# Patient Record
Sex: Female | Born: 1960 | Race: Black or African American | Hispanic: No | Marital: Single | Smoking: Never smoker
Health system: Southern US, Community
[De-identification: ages and names within clinical notes are randomized; demographics above are authoritative.]

## PROBLEM LIST (undated history)

## (undated) DIAGNOSIS — E119 Type 2 diabetes mellitus without complications: Secondary | ICD-10-CM

## (undated) DIAGNOSIS — I639 Cerebral infarction, unspecified: Secondary | ICD-10-CM

## (undated) DIAGNOSIS — I10 Essential (primary) hypertension: Secondary | ICD-10-CM

## (undated) DIAGNOSIS — D649 Anemia, unspecified: Secondary | ICD-10-CM

## (undated) DIAGNOSIS — N189 Chronic kidney disease, unspecified: Secondary | ICD-10-CM

---

## 2016-08-25 ENCOUNTER — Emergency Department
Admission: EM | Admit: 2016-08-25 | Discharge: 2016-08-25 | Disposition: A | Discharge status: Home / Self Care | Payer: Medicare Other | Attending: Emergency Medicine | Admitting: Emergency Medicine

## 2016-08-25 DIAGNOSIS — E162 Hypoglycemia, unspecified: Secondary | ICD-10-CM

## 2016-08-25 DIAGNOSIS — I10 Essential (primary) hypertension: Secondary | ICD-10-CM | POA: Diagnosis not present

## 2016-08-25 DIAGNOSIS — E11649 Type 2 diabetes mellitus with hypoglycemia without coma: Secondary | ICD-10-CM | POA: Diagnosis present

## 2016-08-25 HISTORY — DX: Essential (primary) hypertension: I10

## 2016-08-25 HISTORY — DX: Type 2 diabetes mellitus without complications: E11.9

## 2016-08-25 LAB — CBC WITH DIFFERENTIAL/PLATELET
Basophils Absolute: 0 10*3/uL (ref 0–0.1)
Basophils Relative: 0 %
Eosinophils Absolute: 0 10*3/uL (ref 0–0.7)
Eosinophils Relative: 0 %
HCT: 34 % — ABNORMAL LOW (ref 35.0–47.0)
HEMOGLOBIN: 11.7 g/dL — AB (ref 12.0–16.0)
LYMPHS ABS: 0.3 10*3/uL — AB (ref 1.0–3.6)
MCH: 30.8 pg (ref 26.0–34.0)
MCHC: 34.5 g/dL (ref 32.0–36.0)
MCV: 89.2 fL (ref 80.0–100.0)
Monocytes Absolute: 0.1 10*3/uL — ABNORMAL LOW (ref 0.2–0.9)
Monocytes Relative: 2 %
NEUTROS ABS: 3.1 10*3/uL (ref 1.4–6.5)
Platelets: 90 10*3/uL — ABNORMAL LOW (ref 150–440)
RBC: 3.81 MIL/uL (ref 3.80–5.20)
RDW: 13.2 % (ref 11.5–14.5)
WBC: 3.5 10*3/uL — AB (ref 3.6–11.0)

## 2016-08-25 LAB — COMPREHENSIVE METABOLIC PANEL
ALK PHOS: 415 U/L — AB (ref 38–126)
ALT: 131 U/L — AB (ref 14–54)
AST: 87 U/L — ABNORMAL HIGH (ref 15–41)
Albumin: 4.2 g/dL (ref 3.5–5.0)
Anion gap: 8 (ref 5–15)
BUN: 31 mg/dL — ABNORMAL HIGH (ref 6–20)
CALCIUM: 9.7 mg/dL (ref 8.9–10.3)
CO2: 29 mmol/L (ref 22–32)
CREATININE: 1.35 mg/dL — AB (ref 0.44–1.00)
Chloride: 102 mmol/L (ref 101–111)
GFR, EST AFRICAN AMERICAN: 50 mL/min — AB (ref >60)
GFR, EST NON AFRICAN AMERICAN: 43 mL/min — AB (ref >60)
Glucose, Bld: 182 mg/dL — ABNORMAL HIGH (ref 65–99)
Potassium: 3.8 mmol/L (ref 3.5–5.1)
SODIUM: 139 mmol/L (ref 135–145)
Total Bilirubin: 0.5 mg/dL (ref 0.3–1.2)
Total Protein: 8.1 g/dL (ref 6.5–8.1)

## 2016-08-25 LAB — TROPONIN I: Troponin I: <0.03 ng/mL (ref <0.03)

## 2016-08-25 LAB — GLUCOSE, CAPILLARY: GLUCOSE-CAPILLARY: 334 mg/dL — AB (ref 65–99)

## 2016-08-25 NOTE — ED Triage Notes (Signed)
Pt arrives via [facility] from home with reports of low blood sugar this am  Pt's friend reported to the pt that she was in a cold sweat all night the pt reports altered mental status after awakening today so they called EMS  CBG 33 upon EMS arrival they attempted to encourage PO intake and she became nauseated with one episode of emesis  EMS administered an amp of D50 and 16m Zofran pta

## 2016-08-25 NOTE — Discharge Instructions (Signed)
Please keep close observation of her blood sugars at home. Please contact her primary physician to see if any adjustments need to be established.  Please return immediately if condition worsens. Please contact her primary physician or the physician you were given for referral. If you have any specialist physicians involved in her treatment and plan please also contact them. Thank you for using [facility].

## 2016-08-25 NOTE — ED Notes (Signed)
Pt given graham crackers and peanut butter.

## 2016-08-25 NOTE — ED Notes (Signed)
Took pt to the bathroom.

## 2016-08-25 NOTE — ED Provider Notes (Signed)
Time Seen: Approximately 12:26 PM  I have reviewed the triage notes  Chief Complaint: Hypoglycemia   History of Present Illness: Susan Navarro is a 55 y.o. female who presents with altered mental status and was found by EMS to have a low blood sugar at home. Patient admits to taking her insulin and having nothing to eat. She states that she had altered mental status according to a bystander and had some cold sweat and seemed to be very confused and nauseated. EMS gave the patient a 250 bolus and tried to get her to eat and she had some nausea and vomited 1 with no blood or bile. She states the nausea now is improved. Rates overall she feels fine at this point. Past Medical History:  Diagnosis Date  . Diabetes mellitus without complication ([place])   . Hypertension     There are no active problems to display for this patient.   No past surgical history on file.  No past surgical history on file.    Allergies:  Review of patient's allergies indicates no known allergies.  Family History: No family history on file.  Social History: Social History  Substance Use Topics  . Smoking status: Never Smoker  . Smokeless tobacco: Not on file  . Alcohol use No     Review of Systems:   10 point review of systems was performed and was otherwise negative:  Constitutional: No fever Eyes: No visual disturbances ENT: No sore throat, ear pain Cardiac: No chest pain Respiratory: No shortness of breath, wheezing, or stridor Abdomen: No abdominal pain, no vomiting, No diarrhea Endocrine: No weight loss, No night sweats Extremities: No peripheral edema, cyanosis Skin: No rashes, easy bruising Neurologic: No focal weakness, trouble with speech or swollowing Urologic: No dysuria, Hematuria, or urinary frequency   Physical Exam:  ED Triage Vitals  Enc Vitals Group     BP 08/25/16 1237 (!) 185/89     Pulse Rate 08/25/16 1237 87     Resp 08/25/16 1237 16     Temp 08/25/16 1237 97.8  F (36.6 C)     Temp Source 08/25/16 1237 Oral     SpO2 08/25/16 1237 99 %     Weight 08/25/16 1238 150 lb (68 kg)     Height 08/25/16 1238 5' 6"  (1.676 m)     Head Circumference --      Peak Flow --      Pain Score --      Pain Loc --      Pain Edu? --      Excl. in [place]? --     General: Awake , Alert , and Oriented times 3; GCS 15 Head: Normal cephalic , atraumatic Eyes: Pupils equal , round, reactive to light Nose/Throat: No nasal drainage, patent upper airway without erythema or exudate.  Neck: Supple, Full range of motion, No anterior adenopathy or palpable thyroid masses Lungs: Clear to ascultation without wheezes , rhonchi, or rales Heart: Regular rate, regular rhythm without murmurs , gallops , or rubs Abdomen: Soft, non tender without rebound, guarding , or rigidity; bowel sounds positive and symmetric in all 4 quadrants. No organomegaly .        Extremities: 2 plus symmetric pulses. No edema, clubbing or cyanosis Neurologic: normal ambulation, Motor symmetric without deficits, sensory intact Skin: warm, dry, no rashes   Labs:   All laboratory work was reviewed including any pertinent negatives or positives listed below:  Labs Reviewed  CBC WITH DIFFERENTIAL/PLATELET -  Abnormal; Notable for the following:       Result Value   WBC 3.5 (*)    Hemoglobin 11.7 (*)    HCT 34.0 (*)    Platelets 90 (*)    Lymphs Abs 0.3 (*)    Monocytes Absolute 0.1 (*)    All other components within normal limits  COMPREHENSIVE METABOLIC PANEL - Abnormal; Notable for the following:    Glucose, Bld 182 (*)    BUN 31 (*)    Creatinine, Ser 1.35 (*)    AST 87 (*)    ALT 131 (*)    Alkaline Phosphatase 415 (*)    GFR calc non Af Amer 43 (*)    GFR calc Af Amer 50 (*)    All other components within normal limits  GLUCOSE, CAPILLARY - Abnormal; Notable for the following:    Glucose-Capillary 334 (*)    All other components within normal limits  TROPONIN I    EKG:  ED ECG REPORT I,  Daymon Larsen, the attending physician, personally viewed and interpreted this ECG.  Date: 08/25/2016 EKG Time: 1241 Rate: *84 Rhythm: normal sinus rhythm QRS Axis: normal Intervals: normal ST/T Wave abnormalities: normal Conduction Disturbances: none Narrative Interpretation: unremarkable Poor quality EKG Nonspecific T-wave abnormalities No acute ischemic changes are noted   ED Course:  Patient was given a meal here in emergency department her blood sugar was managed Patient received serial blood sugars which seemed to stabilize. She is currently asymptomatic and I felt she simply did not eat early enough after receiving insulin.  Clinical Course     Assessment:  Acute hypoglycemia Insulin-dependent diabetes   Final Clinical Impression:  Final diagnoses:  Hypoglycemia     Plan:  Outpatient Patient was advised to return immediately if condition worsens. Patient was advised to follow up with their primary care physician or other specialized physicians involved in their outpatient care. The patient and/or family member/power of attorney had laboratory results reviewed at the bedside. All questions and concerns were addressed and appropriate discharge instructions were distributed by the nursing staff.             Daymon Larsen, MD 08/25/16 [phone]

## 2016-10-05 ENCOUNTER — Emergency Department
Admission: EM | Admit: 2016-10-05 | Discharge: 2016-10-05 | Disposition: A | Discharge status: Home / Self Care | Payer: Medicare Other | Attending: Emergency Medicine | Admitting: Emergency Medicine

## 2016-10-05 DIAGNOSIS — I10 Essential (primary) hypertension: Secondary | ICD-10-CM | POA: Diagnosis not present

## 2016-10-05 DIAGNOSIS — R531 Weakness: Secondary | ICD-10-CM | POA: Diagnosis not present

## 2016-10-05 DIAGNOSIS — E119 Type 2 diabetes mellitus without complications: Secondary | ICD-10-CM | POA: Diagnosis not present

## 2016-10-05 LAB — COMPREHENSIVE METABOLIC PANEL
ALK PHOS: 243 U/L — AB (ref 38–126)
ALT: 53 U/L (ref 14–54)
ANION GAP: 7 (ref 5–15)
AST: 30 U/L (ref 15–41)
Albumin: 3.8 g/dL (ref 3.5–5.0)
BUN: 37 mg/dL — ABNORMAL HIGH (ref 6–20)
CALCIUM: 9.2 mg/dL (ref 8.9–10.3)
CHLORIDE: 108 mmol/L (ref 101–111)
CO2: 26 mmol/L (ref 22–32)
Creatinine, Ser: 1.58 mg/dL — ABNORMAL HIGH (ref 0.44–1.00)
GFR calc non Af Amer: 36 mL/min — ABNORMAL LOW (ref >60)
GFR, EST AFRICAN AMERICAN: 41 mL/min — AB (ref >60)
Glucose, Bld: 52 mg/dL — ABNORMAL LOW (ref 65–99)
POTASSIUM: 3.8 mmol/L (ref 3.5–5.1)
SODIUM: 141 mmol/L (ref 135–145)
Total Bilirubin: 0.8 mg/dL (ref 0.3–1.2)
Total Protein: 7 g/dL (ref 6.5–8.1)

## 2016-10-05 LAB — URINALYSIS COMPLETE WITH MICROSCOPIC (ARMC ONLY)
Bilirubin Urine: NEGATIVE
Glucose, UA: NEGATIVE mg/dL
KETONES UR: NEGATIVE mg/dL
LEUKOCYTES UA: NEGATIVE
NITRITE: NEGATIVE
PROTEIN: 30 mg/dL — AB
SPECIFIC GRAVITY, URINE: 1.009 (ref 1.005–1.030)
pH: 7 (ref 5.0–8.0)

## 2016-10-05 LAB — CBC WITH DIFFERENTIAL/PLATELET
BASOS PCT: 1 %
Basophils Absolute: 0 10*3/uL (ref 0–0.1)
EOS ABS: 0 10*3/uL (ref 0–0.7)
EOS PCT: 0 %
HCT: 33.5 % — ABNORMAL LOW (ref 35.0–47.0)
Hemoglobin: 11.3 g/dL — ABNORMAL LOW (ref 12.0–16.0)
LYMPHS ABS: 0.6 10*3/uL — AB (ref 1.0–3.6)
Lymphocytes Relative: 15 %
MCH: 31.3 pg (ref 26.0–34.0)
MCHC: 33.8 g/dL (ref 32.0–36.0)
MCV: 92.7 fL (ref 80.0–100.0)
MONOS PCT: 5 %
Monocytes Absolute: 0.2 10*3/uL (ref 0.2–0.9)
Neutro Abs: 3 10*3/uL (ref 1.4–6.5)
Neutrophils Relative %: 79 %
PLATELETS: 90 10*3/uL — AB (ref 150–440)
RBC: 3.62 MIL/uL — ABNORMAL LOW (ref 3.80–5.20)
RDW: 13.7 % (ref 11.5–14.5)
WBC: 3.8 10*3/uL (ref 3.6–11.0)

## 2016-10-05 LAB — TROPONIN I
Troponin I: 0.03 ng/mL (ref <0.03)
Troponin I: 0.03 ng/mL (ref <0.03)

## 2016-10-05 MED ORDER — SODIUM CHLORIDE 0.9 % IV SOLN: Freq: Once | INTRAVENOUS | Status: DC

## 2016-10-05 NOTE — ED Triage Notes (Signed)
States wasn't able to get out of bed today. bs 160.

## 2016-10-05 NOTE — ED Notes (Signed)
Pt assisted to the toilet in room. Pt ambulated with steady gait and was able to produce urine. No difficulties reported.

## 2016-10-05 NOTE — ED Notes (Signed)
Pt ambulated to bathroom for the 3rd time - has had no problems ambulating since her arrival

## 2016-10-05 NOTE — ED Provider Notes (Signed)
[facility] Emergency Department Provider Note        Time seen: ----------------------------------------- 12:27 PM on 10/05/2016 -----------------------------------------    I have reviewed the triage vital signs and the nursing notes.   HISTORY  Chief Complaint Fatigue    HPI Susan Navarro is a 55 y.o. female who presents to the ER after she states she was not able to get out of bed today. She states she felt generally weak, she denies any recent illness or changes in her medicines. She states she feels a little bit better now, nothing makes her symptoms better or worse. It started acutely this morning.   Past Medical History:  Diagnosis Date  . Diabetes mellitus without complication ([place])   . Hypertension     There are no active problems to display for this patient.   No past surgical history on file.  Allergies Review of patient's allergies indicates no known allergies.  Social History Social History  Substance Use Topics  . Smoking status: Never Smoker  . Smokeless tobacco: Not on file  . Alcohol use No    Review of Systems Constitutional: Negative for fever. Cardiovascular: Negative for chest pain. Respiratory: Negative for shortness of breath. Gastrointestinal: Negative for abdominal pain, vomiting and diarrhea. Genitourinary: Negative for dysuria. Musculoskeletal: Negative for back pain. Skin: Negative for rash. Neurological:Positive for generalized weakness  10-point ROS otherwise negative.  ____________________________________________   PHYSICAL EXAM:  VITAL SIGNS: ED Triage Vitals  Enc Vitals Group     BP 10/05/16 1207 (!) 172/88     Pulse Rate 10/05/16 1207 86     Resp 10/05/16 1207 16     Temp 10/05/16 1208 97.7 F (36.5 C)     Temp Source 10/05/16 1208 Oral     SpO2 10/05/16 1207 100 %     Weight 10/05/16 1207 150 lb (68 kg)     Height 10/05/16 1207 5' 6"  (1.676 m)     Head Circumference --      Peak  Flow --      Pain Score --      Pain Loc --      Pain Edu? --      Excl. in [place]? --     Constitutional: Alert and oriented. Well appearing and in no distress. Eyes: Conjunctivae are normal. PERRL. Normal extraocular movements. ENT   Head: Normocephalic and atraumatic.   Nose: No congestion/rhinnorhea.   Mouth/Throat: Mucous membranes are moist.   Neck: No stridor. Cardiovascular: Normal rate, regular rhythm. No murmurs, rubs, or gallops. Respiratory: Normal respiratory effort without tachypnea nor retractions. Breath sounds are clear and equal bilaterally. No wheezes/rales/rhonchi. Gastrointestinal: Soft and nontender. Normal bowel sounds Musculoskeletal: Nontender with normal range of motion in all extremities. No lower extremity tenderness nor edema. Neurologic:  Normal speech and language. No gross focal neurologic deficits are appreciated.  Skin:  Skin is warm, dry and intact. No rash noted. Psychiatric: Mood and affect are normal. Speech and behavior are normal.  ____________________________________________  EKG: Interpreted by me. Sinus rhythm rate 92 bpm, normal PR interval, normal QRS, normal QT, normal axis.  ____________________________________________  ED COURSE:  Pertinent labs & imaging results that were available during my care of the patient were reviewed by me and considered in my medical decision making (see chart for details). Clinical Course  Patient presents to ER for weakness of unclear etiology. We will assess basic labs and imaging if needed.  Procedures ____________________________________________   LABS (pertinent positives/negatives)  Labs  Reviewed  CBC WITH DIFFERENTIAL/PLATELET - Abnormal; Notable for the following:       Result Value   RBC 3.62 (*)    Hemoglobin 11.3 (*)    HCT 33.5 (*)    Platelets 90 (*)    Lymphs Abs 0.6 (*)    All other components within normal limits  COMPREHENSIVE METABOLIC PANEL - Abnormal; Notable for the  following:    Glucose, Bld 52 (*)    BUN 37 (*)    Creatinine, Ser 1.58 (*)    Alkaline Phosphatase 243 (*)    GFR calc non Af Amer 36 (*)    GFR calc Af Amer 41 (*)    All other components within normal limits  URINALYSIS COMPLETEWITH MICROSCOPIC ([facility] ONLY) - Abnormal; Notable for the following:    Color, Urine YELLOW (*)    APPearance HAZY (*)    Hgb urine dipstick 1+ (*)    Protein, ur 30 (*)    Bacteria, UA MANY (*)    Squamous Epithelial / LPF 0-5 (*)    All other components within normal limits  TROPONIN I - Abnormal; Notable for the following:    Troponin I 0.03 (*)    All other components within normal limits  TROPONIN I  CBG MONITORING, ED   ____________________________________________  FINAL ASSESSMENT AND PLAN  Weakness  Plan: Patient with labs as dictated above. Patient's in no acute distress, labs are unremarkable. Likely transient hypoglycemia. Uncertain etiology for troponin of 0.03. She is stable for outpatient follow-up.   Earleen Newport, MD   Note: This dictation was prepared with Dragon dictation. Any transcriptional errors that result from this process are unintentional    Earleen Newport, MD 10/05/16 [phone]

## 2017-02-25 ENCOUNTER — Encounter: Payer: Self-pay | Admitting: Intensive Care

## 2017-02-25 ENCOUNTER — Emergency Department
Admission: EM | Admit: 2017-02-25 | Discharge: 2017-02-25 | Disposition: A | Discharge status: Home / Self Care | Payer: Medicare Other | Attending: Emergency Medicine | Admitting: Emergency Medicine

## 2017-02-25 DIAGNOSIS — Z794 Long term (current) use of insulin: Secondary | ICD-10-CM | POA: Diagnosis not present

## 2017-02-25 DIAGNOSIS — E162 Hypoglycemia, unspecified: Secondary | ICD-10-CM | POA: Diagnosis present

## 2017-02-25 DIAGNOSIS — E11649 Type 2 diabetes mellitus with hypoglycemia without coma: Secondary | ICD-10-CM | POA: Diagnosis not present

## 2017-02-25 DIAGNOSIS — Z9114 Patient's other noncompliance with medication regimen: Secondary | ICD-10-CM | POA: Diagnosis not present

## 2017-02-25 DIAGNOSIS — I10 Essential (primary) hypertension: Secondary | ICD-10-CM | POA: Insufficient documentation

## 2017-02-25 DIAGNOSIS — R350 Frequency of micturition: Secondary | ICD-10-CM | POA: Diagnosis not present

## 2017-02-25 LAB — URINALYSIS, COMPLETE (UACMP) WITH MICROSCOPIC
BACTERIA UA: NONE SEEN
BILIRUBIN URINE: NEGATIVE
Glucose, UA: NEGATIVE mg/dL
Ketones, ur: NEGATIVE mg/dL
NITRITE: NEGATIVE
PH: 8 (ref 5.0–8.0)
Protein, ur: 100 mg/dL — AB
SPECIFIC GRAVITY, URINE: 1.01 (ref 1.005–1.030)

## 2017-02-25 LAB — GLUCOSE, CAPILLARY
GLUCOSE-CAPILLARY: 99 mg/dL (ref 65–99)
GLUCOSE-CAPILLARY: 148 mg/dL — AB (ref 65–99)
GLUCOSE-CAPILLARY: 306 mg/dL — AB (ref 65–99)

## 2017-02-25 LAB — CBC WITH DIFFERENTIAL/PLATELET
BASOS ABS: 0 10*3/uL (ref 0–0.1)
BASOS PCT: 0 %
EOS ABS: 0 10*3/uL (ref 0–0.7)
Eosinophils Relative: 1 %
HEMATOCRIT: 35.7 % (ref 35.0–47.0)
Hemoglobin: 12 g/dL (ref 12.0–16.0)
LYMPHS ABS: 0.5 10*3/uL — AB (ref 1.0–3.6)
Lymphocytes Relative: 15 %
MCH: 30.2 pg (ref 26.0–34.0)
MCHC: 33.6 g/dL (ref 32.0–36.0)
MCV: 89.7 fL (ref 80.0–100.0)
MONO ABS: 0.2 10*3/uL (ref 0.2–0.9)
Monocytes Relative: 5 %
NEUTROS ABS: 2.5 10*3/uL (ref 1.4–6.5)
Neutrophils Relative %: 79 %
PLATELETS: 80 10*3/uL — AB (ref 150–440)
RBC: 3.98 MIL/uL (ref 3.80–5.20)
RDW: 13.4 % (ref 11.5–14.5)
WBC: 3.2 10*3/uL — ABNORMAL LOW (ref 3.6–11.0)

## 2017-02-25 LAB — COMPREHENSIVE METABOLIC PANEL
ALBUMIN: 4.1 g/dL (ref 3.5–5.0)
ALT: 141 U/L — ABNORMAL HIGH (ref 14–54)
ANION GAP: 10 (ref 5–15)
AST: 121 U/L — ABNORMAL HIGH (ref 15–41)
Alkaline Phosphatase: 356 U/L — ABNORMAL HIGH (ref 38–126)
BILIRUBIN TOTAL: 1.1 mg/dL (ref 0.3–1.2)
BUN: 38 mg/dL — AB (ref 6–20)
CALCIUM: 9.5 mg/dL (ref 8.9–10.3)
CO2: 26 mmol/L (ref 22–32)
CREATININE: 1.34 mg/dL — AB (ref 0.44–1.00)
Chloride: 108 mmol/L (ref 101–111)
GFR calc Af Amer: 50 mL/min — ABNORMAL LOW (ref >60)
GFR calc non Af Amer: 43 mL/min — ABNORMAL LOW (ref >60)
GLUCOSE: 108 mg/dL — AB (ref 65–99)
Potassium: 4 mmol/L (ref 3.5–5.1)
SODIUM: 144 mmol/L (ref 135–145)
TOTAL PROTEIN: 7.4 g/dL (ref 6.5–8.1)

## 2017-02-25 MED ORDER — ENALAPRIL MALEATE 10 MG PO TABS
10.0000 mg | ORAL_TABLET | Freq: Once | ORAL | Status: AC
  Administered 2017-02-25: 10 mg via ORAL
  Filled 2017-02-25: qty 1

## 2017-02-25 MED ORDER — METOPROLOL TARTRATE 25 MG PO TABS
25.0000 mg | ORAL_TABLET | Freq: Once | ORAL | Status: AC
  Administered 2017-02-25: 25 mg via ORAL
  Filled 2017-02-25: qty 1

## 2017-02-25 NOTE — Discharge Instructions (Signed)
Do not take your Lantus today. You may restart it tomorrow morning. Follow-up with her doctor tomorrow for further management.

## 2017-02-25 NOTE — ED Triage Notes (Signed)
Patient arrived by EMS from home for hypoglycemia. Took 24 lantus yesterday morning. EMS initial blood sugar 38. EMS gave IM glucagon and brought blood sugar to 98. A&O x4 upon arrival

## 2017-02-25 NOTE — ED Provider Notes (Signed)
[facility] Emergency Department Provider Note  ____________________________________________  Time seen: Approximately 12:26 PM  I have reviewed the triage vital signs and the nursing notes.   HISTORY  Chief Complaint Hypoglycemia   HPI Susan Navarro is a 56 y.o. female a history of hypertension, NAFL cirrhosis, and poorly controlled diabetes who presents for evaluation of hypoglycemia. Review of Epic shows the patient's PCP is documented many episodes of hypoglycemia and the patient refuses to use sliding scale insulin. She is only on Lantus 24 units at morning time.She last took her Lantus 24 hours ago. This morning she reports that she was very weak and couldn't get up from the bed which prompted her boyfriend to call EMS. When EMS arrived patient's blood glucose was 38. She received a dose of IM glucagon. Patient reports that she feels back to her baseline. She has had urinary frequency for the last 24 hours. She denies any recent changes in her insulin regimen. Patient has had multiple episodes of hypoglycemia in the past. She endorses having meat for dinner last night. She hasn't been anything this morning yet. She hasn't taken her Lantus this morning. She has a mild headache since she woke up this morning that has been constant and frontal. She denies nausea, vomiting, diarrhea, chest pain, shortness of breath, URI symptoms, abdominal pain, dysuria.  Past Medical History:  Diagnosis Date  . Diabetes mellitus without complication ([place])   . Hypertension     There are no active problems to display for this patient.   History reviewed. No pertinent surgical history.  Prior to Admission medications   Not on File    Allergies Patient has no known allergies.  History reviewed. No pertinent family history.  Social History Social History  Substance Use Topics  . Smoking status: Never Smoker  . Smokeless tobacco: Never Used  . Alcohol use No     Review of Systems  Constitutional: Negative for fever. Eyes: Negative for visual changes. ENT: Negative for sore throat. Neck: No neck pain  Cardiovascular: Negative for chest pain. Respiratory: Negative for shortness of breath. Gastrointestinal: Negative for abdominal pain, vomiting or diarrhea. Genitourinary: Negative for dysuria. Musculoskeletal: Negative for back pain. Skin: Negative for rash. Neurological: Negative for weakness or numbness. + HA Psych: No SI or HI  ____________________________________________   PHYSICAL EXAM:  VITAL SIGNS: ED Triage Vitals  Enc Vitals Group     BP 02/25/17 1108 (!) 188/91     Pulse Rate 02/25/17 1108 78     Resp 02/25/17 1108 13     Temp 02/25/17 1108 97.6 F (36.4 C)     Temp Source 02/25/17 1108 Oral     SpO2 02/25/17 1108 98 %     Weight 02/25/17 1105 150 lb (68 kg)     Height 02/25/17 1105 5' 6"  (1.676 m)     Head Circumference --      Peak Flow --      Pain Score --      Pain Loc --      Pain Edu? --      Excl. in [place]? --     Constitutional: Alert and oriented. Well appearing and in no apparent distress. HEENT:      Head: Normocephalic and atraumatic.         Eyes: Conjunctivae are normal. Sclera is non-icteric. EOMI. PERRL      Mouth/Throat: Mucous membranes are moist.       Neck: Supple with no signs of  meningismus. Cardiovascular: Regular rate and rhythm. No murmurs, gallops, or rubs. 2+ symmetrical distal pulses are present in all extremities. No JVD. Respiratory: Normal respiratory effort. Lungs are clear to auscultation bilaterally. No wheezes, crackles, or rhonchi.  Gastrointestinal: Soft, non tender, and non distended with positive bowel sounds. No rebound or guarding. Genitourinary: No CVA tenderness. Musculoskeletal: Nontender with normal range of motion in all extremities. No edema, cyanosis, or erythema of extremities. Neurologic: Normal speech and language. A & O x3, PERRL, no nystagmus, CN II-XII intact,  motor testing reveals good tone and bulk throughout. There is no evidence of pronator drift or dysmetria. Muscle strength is 5/5 throughout. Deep tendon reflexes are 2+ throughout with downgoing toes. Sensory examination is intact. Gait is normal. Skin: Skin is warm, dry and intact. No rash noted. Psychiatric: Mood and affect are normal. Speech and behavior are normal.  ____________________________________________   LABS (all labs ordered are listed, but only abnormal results are displayed)  Labs Reviewed  COMPREHENSIVE METABOLIC PANEL - Abnormal; Notable for the following:       Result Value   Glucose, Bld 108 (*)    BUN 38 (*)    Creatinine, Ser 1.34 (*)    AST 121 (*)    ALT 141 (*)    Alkaline Phosphatase 356 (*)    GFR calc non Af Amer 43 (*)    GFR calc Af Amer 50 (*)    All other components within normal limits  CBC WITH DIFFERENTIAL/PLATELET - Abnormal; Notable for the following:    WBC 3.2 (*)    Platelets 80 (*)    Lymphs Abs 0.5 (*)    All other components within normal limits  URINALYSIS, COMPLETE (UACMP) WITH MICROSCOPIC - Abnormal; Notable for the following:    Color, Urine YELLOW (*)    APPearance HAZY (*)    Hgb urine dipstick SMALL (*)    Protein, ur 100 (*)    Leukocytes, UA TRACE (*)    Squamous Epithelial / LPF 0-5 (*)    All other components within normal limits  GLUCOSE, CAPILLARY - Abnormal; Notable for the following:    Glucose-Capillary 148 (*)    All other components within normal limits  URINE CULTURE  GLUCOSE, CAPILLARY  CBG MONITORING, ED  CBG MONITORING, ED   ____________________________________________  EKG  ED ECG REPORT I, Rudene Re, the attending physician, personally viewed and interpreted this ECG.  Normal sinus rhythm, rate of 76, prolonged QRS at 147, normal QTC, normal axis, no ST elevations or  depressions. ____________________________________________  RADIOLOGY  none ____________________________________________   PROCEDURES  Procedure(s) performed: None Procedures Critical Care performed:  None ____________________________________________   INITIAL IMPRESSION / ASSESSMENT AND PLAN / ED COURSE  56 y.o. female a history of hypertension and poorly controlled diabetes who presents for evaluation of hypoglycemia. Patient's last insulin was 24 hours ago. She is on Lantus only. She refuses to be placed on a sliding scale per her PCP. Patient only had meet last night for dinner with no carbs. She also has had urinary frequency. Will check basic blood work, UA to eval for UTI. Will give her morning HTN meds, will give her breakfast. Will monitor sugars for a few hours.  Clinical Course as of Feb 26 1356  Wed Feb 25, 2017  1355 Patient is tolerating by mouth. He remains at baseline. Blood work showing mildly elevated LFTs the patient has had in the past. She does have a history of cirrhosis. Patient has no abdominal  pain or tenderness on exam. Her UA does not show any evidence of a urinary tract infection. Urine culture is pending. We'll repeat blood glucose at this time and if it stable will discharge patient home. Recommended that she does not take her Lantus today and re-start tomorrow morning. Recommended that she follows up with her primary care doctor tomorrow morning for further evaluation.  [CV]    Clinical Course User Index [CV] Rudene Re, MD    Pertinent labs & imaging results that were available during my care of the patient were reviewed by me and considered in my medical decision making (see chart for details).    ____________________________________________   FINAL CLINICAL IMPRESSION(S) / ED DIAGNOSES  Final diagnoses:  Hypoglycemia associated with type 2 diabetes mellitus (HCC)      NEW MEDICATIONS STARTED DURING THIS VISIT:  New Prescriptions    No medications on file     Note:  This document was prepared using Dragon voice recognition software and may include unintentional dictation errors.    Rudene Re, MD 02/25/17 1357

## 2017-02-25 NOTE — ED Notes (Signed)
Pt given orange juice and sandwich tray.

## 2017-02-26 LAB — URINE CULTURE

## 2018-03-22 DIAGNOSIS — I639 Cerebral infarction, unspecified: Secondary | ICD-10-CM | POA: Diagnosis present

## 2018-03-22 HISTORY — DX: Cerebral infarction, unspecified: I63.9

## 2018-12-09 ENCOUNTER — Emergency Department: Payer: Medicare Other

## 2018-12-09 ENCOUNTER — Inpatient Hospital Stay
Admission: EM | Admit: 2018-12-09 | Discharge: 2018-12-10 | DRG: 638 | Disposition: A | Discharge status: Home Health | Payer: Medicare Other | Attending: Internal Medicine | Admitting: Internal Medicine

## 2018-12-09 ENCOUNTER — Other Ambulatory Visit: Payer: Self-pay

## 2018-12-09 DIAGNOSIS — E860 Dehydration: Secondary | ICD-10-CM | POA: Diagnosis not present

## 2018-12-09 DIAGNOSIS — R739 Hyperglycemia, unspecified: Secondary | ICD-10-CM

## 2018-12-09 DIAGNOSIS — E1110 Type 2 diabetes mellitus with ketoacidosis without coma: Secondary | ICD-10-CM | POA: Diagnosis not present

## 2018-12-09 DIAGNOSIS — E871 Hypo-osmolality and hyponatremia: Secondary | ICD-10-CM | POA: Diagnosis present

## 2018-12-09 DIAGNOSIS — Z79899 Other long term (current) drug therapy: Secondary | ICD-10-CM | POA: Diagnosis not present

## 2018-12-09 DIAGNOSIS — N179 Acute kidney failure, unspecified: Secondary | ICD-10-CM | POA: Diagnosis not present

## 2018-12-09 DIAGNOSIS — N1330 Unspecified hydronephrosis: Secondary | ICD-10-CM

## 2018-12-09 DIAGNOSIS — I129 Hypertensive chronic kidney disease with stage 1 through stage 4 chronic kidney disease, or unspecified chronic kidney disease: Secondary | ICD-10-CM | POA: Diagnosis present

## 2018-12-09 DIAGNOSIS — Z794 Long term (current) use of insulin: Secondary | ICD-10-CM | POA: Diagnosis not present

## 2018-12-09 DIAGNOSIS — D638 Anemia in other chronic diseases classified elsewhere: Secondary | ICD-10-CM | POA: Diagnosis not present

## 2018-12-09 DIAGNOSIS — Z23 Encounter for immunization: Secondary | ICD-10-CM

## 2018-12-09 DIAGNOSIS — Z7982 Long term (current) use of aspirin: Secondary | ICD-10-CM

## 2018-12-09 DIAGNOSIS — E1122 Type 2 diabetes mellitus with diabetic chronic kidney disease: Secondary | ICD-10-CM | POA: Diagnosis not present

## 2018-12-09 DIAGNOSIS — J209 Acute bronchitis, unspecified: Secondary | ICD-10-CM | POA: Diagnosis present

## 2018-12-09 DIAGNOSIS — N183 Chronic kidney disease, stage 3 (moderate): Secondary | ICD-10-CM | POA: Diagnosis present

## 2018-12-09 LAB — BASIC METABOLIC PANEL
Anion gap: 15 (ref 5–15)
Anion gap: 13 (ref 5–15)
Anion gap: 11 (ref 5–15)
BUN: 66 mg/dL — ABNORMAL HIGH (ref 6–20)
BUN: 65 mg/dL — ABNORMAL HIGH (ref 6–20)
BUN: 68 mg/dL — ABNORMAL HIGH (ref 6–20)
CO2: 22 mmol/L (ref 22–32)
CO2: 24 mmol/L (ref 22–32)
CO2: 27 mmol/L (ref 22–32)
Calcium: 9.8 mg/dL (ref 8.9–10.3)
Calcium: 9.7 mg/dL (ref 8.9–10.3)
Calcium: 10.1 mg/dL (ref 8.9–10.3)
Chloride: 93 mmol/L — ABNORMAL LOW (ref 98–111)
Chloride: 96 mmol/L — ABNORMAL LOW (ref 98–111)
Chloride: 99 mmol/L (ref 98–111)
Creatinine, Ser: 2.6 mg/dL — ABNORMAL HIGH (ref 0.44–1.00)
Creatinine, Ser: 2.44 mg/dL — ABNORMAL HIGH (ref 0.44–1.00)
Creatinine, Ser: 2.38 mg/dL — ABNORMAL HIGH (ref 0.44–1.00)
GFR calc Af Amer: 23 mL/min — ABNORMAL LOW (ref >60)
GFR calc Af Amer: 24 mL/min — ABNORMAL LOW (ref >60)
GFR calc Af Amer: 25 mL/min — ABNORMAL LOW (ref >60)
GFR calc non Af Amer: 20 mL/min — ABNORMAL LOW (ref >60)
GFR calc non Af Amer: 21 mL/min — ABNORMAL LOW (ref >60)
GFR calc non Af Amer: 22 mL/min — ABNORMAL LOW (ref >60)
GLUCOSE: 741 mg/dL — AB (ref 70–99)
Glucose, Bld: 468 mg/dL — ABNORMAL HIGH (ref 70–99)
Glucose, Bld: 109 mg/dL — ABNORMAL HIGH (ref 70–99)
POTASSIUM: 3.8 mmol/L (ref 3.5–5.1)
Potassium: 4.2 mmol/L (ref 3.5–5.1)
Potassium: 3.6 mmol/L (ref 3.5–5.1)
Sodium: 130 mmol/L — ABNORMAL LOW (ref 135–145)
Sodium: 133 mmol/L — ABNORMAL LOW (ref 135–145)
Sodium: 137 mmol/L (ref 135–145)

## 2018-12-09 LAB — COMPREHENSIVE METABOLIC PANEL
ALT: 107 U/L — ABNORMAL HIGH (ref 0–44)
AST: 32 U/L (ref 15–41)
Albumin: 4.6 g/dL (ref 3.5–5.0)
Alkaline Phosphatase: 646 U/L — ABNORMAL HIGH (ref 38–126)
Anion gap: 20 — ABNORMAL HIGH (ref 5–15)
BILIRUBIN TOTAL: 1.3 mg/dL — AB (ref 0.3–1.2)
BUN: 73 mg/dL — ABNORMAL HIGH (ref 6–20)
CALCIUM: 10 mg/dL (ref 8.9–10.3)
CO2: 22 mmol/L (ref 22–32)
Chloride: 83 mmol/L — ABNORMAL LOW (ref 98–111)
Creatinine, Ser: 2.71 mg/dL — ABNORMAL HIGH (ref 0.44–1.00)
GFR calc non Af Amer: 19 mL/min — ABNORMAL LOW (ref >60)
GFR, EST AFRICAN AMERICAN: 22 mL/min — AB (ref >60)
Glucose, Bld: 1068 mg/dL (ref 70–99)
POTASSIUM: 5.6 mmol/L — AB (ref 3.5–5.1)
Sodium: 125 mmol/L — ABNORMAL LOW (ref 135–145)
TOTAL PROTEIN: 7.4 g/dL (ref 6.5–8.1)

## 2018-12-09 LAB — BLOOD GAS, VENOUS
Acid-base deficit: 4 mmol/L — ABNORMAL HIGH (ref 0.0–2.0)
BICARBONATE: 23.4 mmol/L (ref 20.0–28.0)
O2 Saturation: 64.8 %
PATIENT TEMPERATURE: 37
pCO2, Ven: 51 mmHg (ref 44.0–60.0)
pH, Ven: 7.27 (ref 7.250–7.430)
pO2, Ven: 39 mmHg (ref 32.0–45.0)

## 2018-12-09 LAB — GLUCOSE, CAPILLARY
GLUCOSE-CAPILLARY: 65 mg/dL — AB (ref 70–99)
Glucose-Capillary: >600 mg/dL (ref 70–99)
Glucose-Capillary: >600 mg/dL (ref 70–99)
Glucose-Capillary: 522 mg/dL (ref 70–99)
Glucose-Capillary: 449 mg/dL — ABNORMAL HIGH (ref 70–99)
Glucose-Capillary: 414 mg/dL — ABNORMAL HIGH (ref 70–99)
Glucose-Capillary: 265 mg/dL — ABNORMAL HIGH (ref 70–99)
Glucose-Capillary: 177 mg/dL — ABNORMAL HIGH (ref 70–99)
Glucose-Capillary: 103 mg/dL — ABNORMAL HIGH (ref 70–99)
Glucose-Capillary: 94 mg/dL (ref 70–99)

## 2018-12-09 LAB — CBC
HCT: 33 % — ABNORMAL LOW (ref 36.0–46.0)
HCT: 32 % — ABNORMAL LOW (ref 36.0–46.0)
HEMOGLOBIN: 10.7 g/dL — AB (ref 12.0–15.0)
Hemoglobin: 11.1 g/dL — ABNORMAL LOW (ref 12.0–15.0)
MCH: 30.1 pg (ref 26.0–34.0)
MCH: 30.5 pg (ref 26.0–34.0)
MCHC: 32.4 g/dL (ref 30.0–36.0)
MCHC: 34.7 g/dL (ref 30.0–36.0)
MCV: 92.7 fL (ref 80.0–100.0)
MCV: 87.9 fL (ref 80.0–100.0)
NRBC: 0 % (ref 0.0–0.2)
NRBC: 0 % (ref 0.0–0.2)
Platelets: 82 10*3/uL — ABNORMAL LOW (ref 150–400)
Platelets: 79 10*3/uL — ABNORMAL LOW (ref 150–400)
RBC: 3.56 MIL/uL — AB (ref 3.87–5.11)
RBC: 3.64 MIL/uL — AB (ref 3.87–5.11)
RDW: 12.7 % (ref 11.5–15.5)
RDW: 12.9 % (ref 11.5–15.5)
WBC: 9.4 10*3/uL (ref 4.0–10.5)
WBC: 11.4 10*3/uL — ABNORMAL HIGH (ref 4.0–10.5)

## 2018-12-09 LAB — URINALYSIS, COMPLETE (UACMP) WITH MICROSCOPIC
BILIRUBIN URINE: NEGATIVE
Bacteria, UA: NONE SEEN
Glucose, UA: >=500 mg/dL — AB
KETONES UR: 5 mg/dL — AB
LEUKOCYTES UA: NEGATIVE
NITRITE: NEGATIVE
PROTEIN: NEGATIVE mg/dL
Specific Gravity, Urine: 1.016 (ref 1.005–1.030)
pH: 6 (ref 5.0–8.0)

## 2018-12-09 LAB — INFLUENZA PANEL BY PCR (TYPE A & B)
INFLAPCR: NEGATIVE
INFLBPCR: NEGATIVE

## 2018-12-09 LAB — LIPASE, BLOOD: Lipase: 34 U/L (ref 11–51)

## 2018-12-09 LAB — MRSA PCR SCREENING: MRSA BY PCR: NEGATIVE

## 2018-12-09 IMAGING — CR DG CHEST 2V
1 series · 2 of 2 positions shown · non-contrast
Comparison: None.

CLINICAL DATA: 57-year-old female with flu like symptoms beginning
last night. Productive cough.

EXAM:
CHEST - 2 VIEW

[Series 1: x chest ap · 0.14mm/px · 2 of 2 slices shown]
[im 1/2]
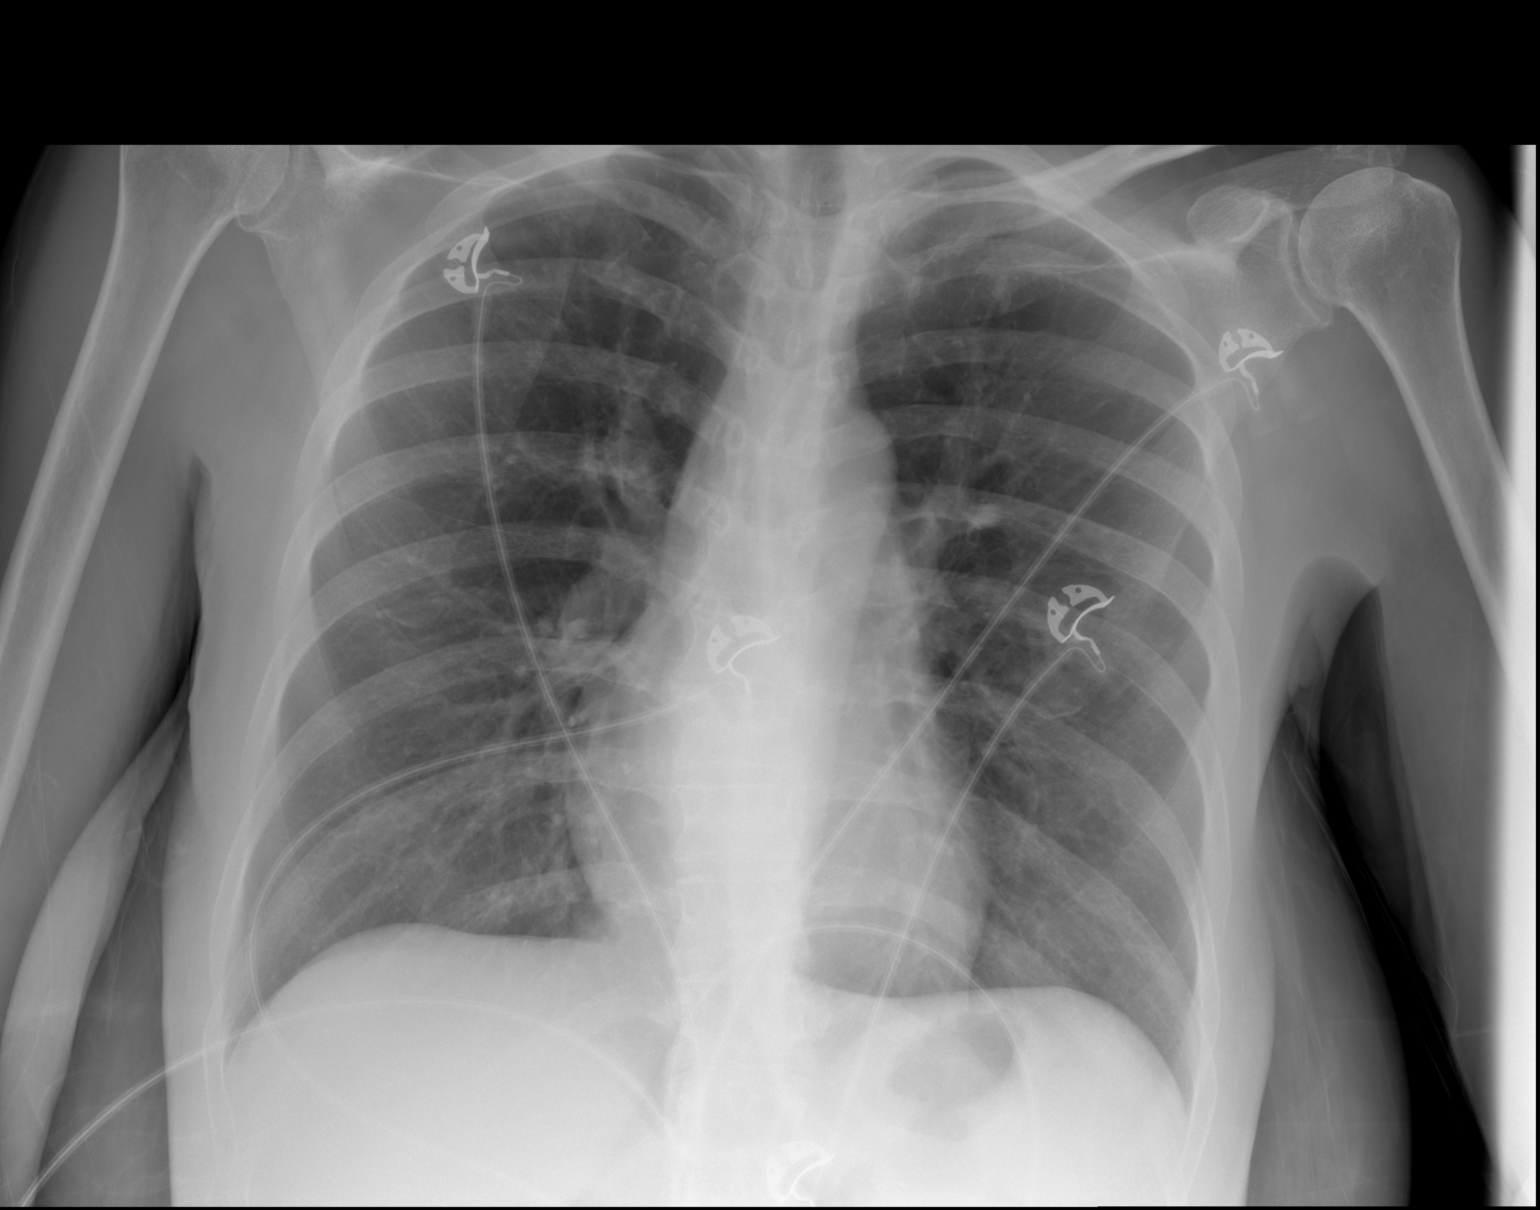
[im 2/2]
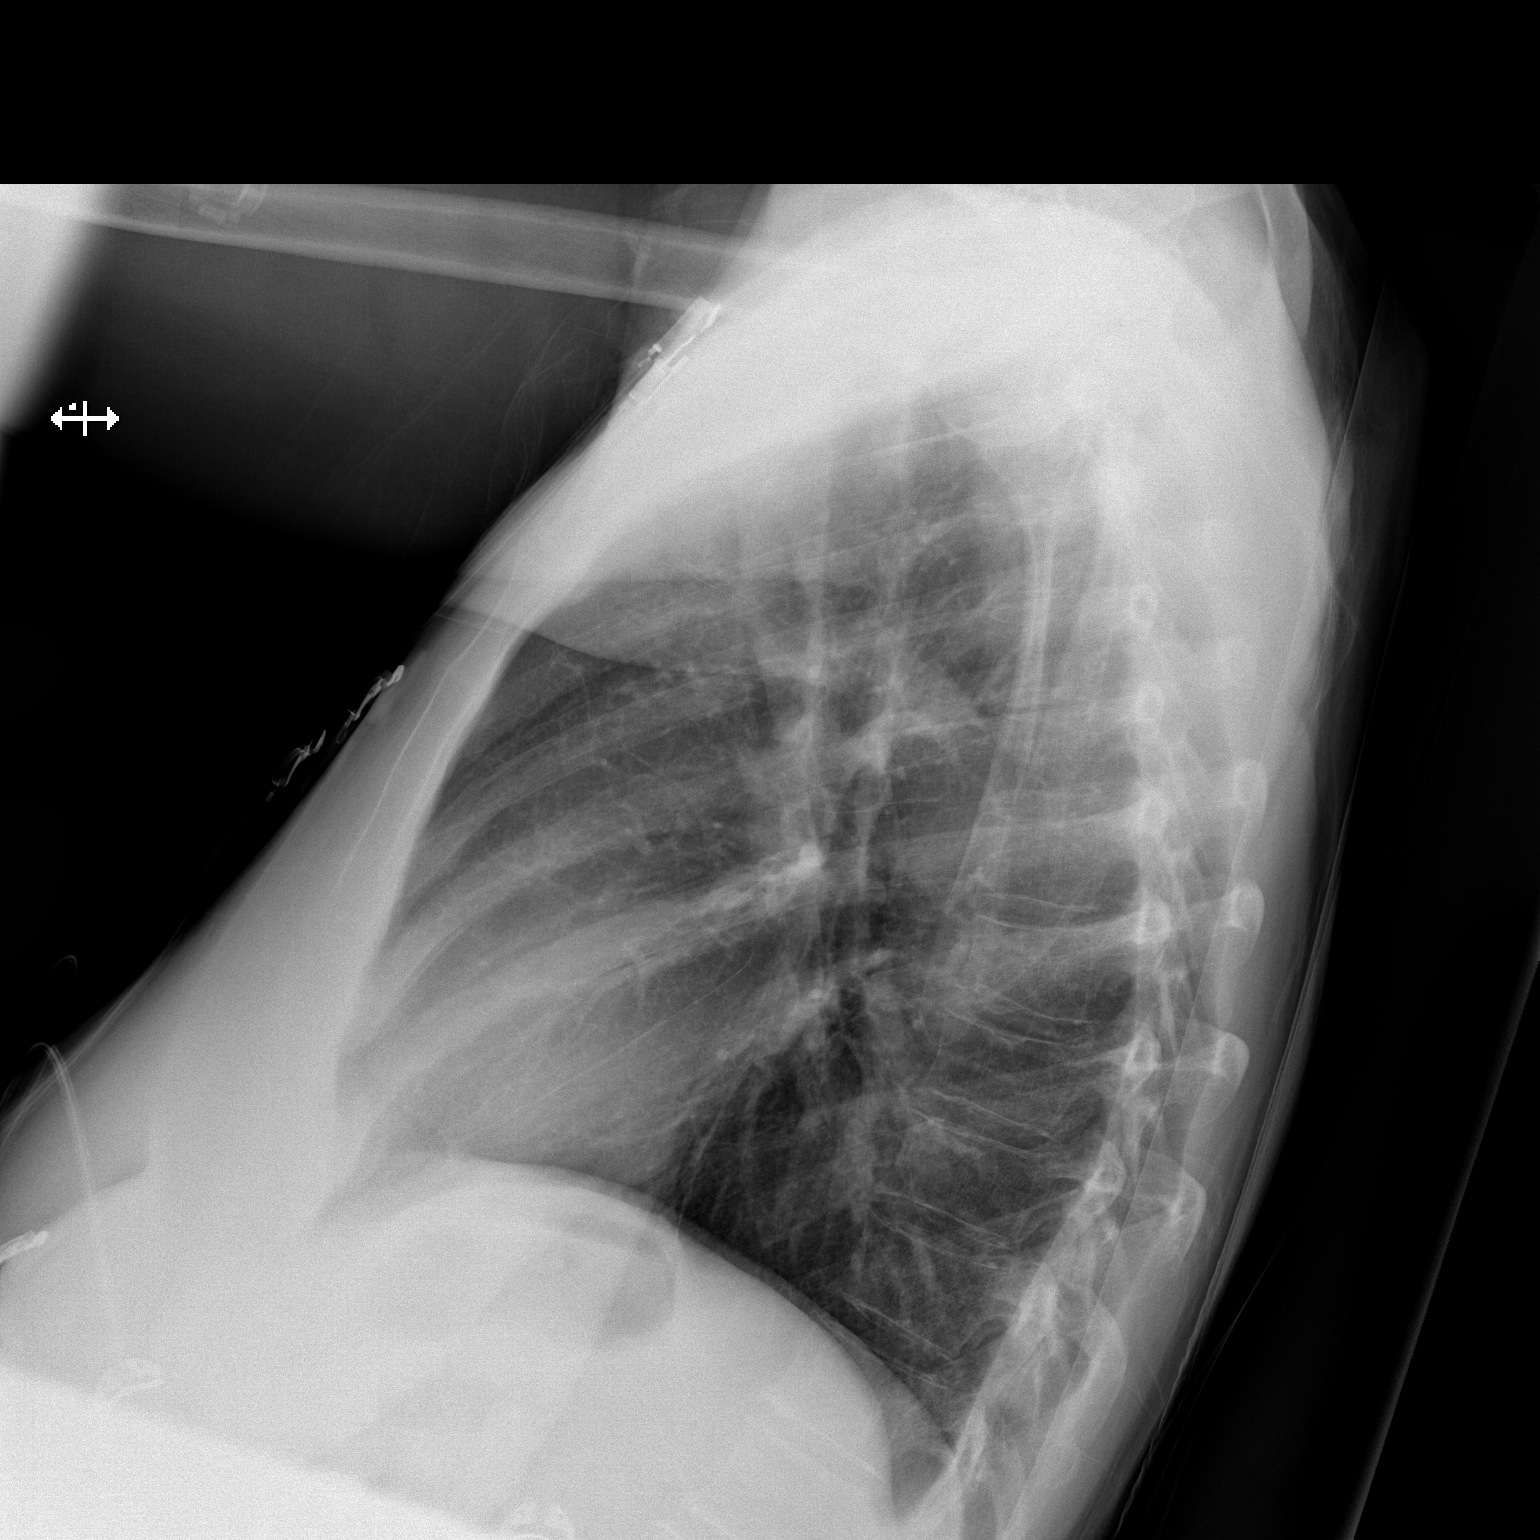

[2 of 2 positions shown; findings below may reference images not displayed]

FINDINGS: Seated AP and lateral views of the chest. Normal cardiac size and
mediastinal contours. Lung volumes are at the upper limits of
normal. Visualized tracheal air column is within normal limits. No
pneumothorax, pulmonary edema, pleural effusion or confluent
pulmonary opacity. There is some attenuation of upper lobe
bronchovascular markings.

No acute osseous abnormality identified. Negative visible bowel gas
pattern.
IMPRESSION: 1. No acute cardiopulmonary abnormality.
2. Possible emphysema.

## 2018-12-09 MED ORDER — SODIUM CHLORIDE 0.9 % IV SOLN
INTRAVENOUS | Status: DC
  Administered 2018-12-09: 15:00:00 via INTRAVENOUS

## 2018-12-09 MED ORDER — AZITHROMYCIN 500 MG PO TABS
500.0000 mg | ORAL_TABLET | Freq: Every day | ORAL | Status: DC
  Administered 2018-12-09: 500 mg via ORAL
  Filled 2018-12-09 (×2): qty 1

## 2018-12-09 MED ORDER — ATORVASTATIN CALCIUM 20 MG PO TABS
80.0000 mg | ORAL_TABLET | Freq: Every evening | ORAL | Status: DC
  Administered 2018-12-09: 80 mg via ORAL
  Filled 2018-12-09: qty 4

## 2018-12-09 MED ORDER — GUAIFENESIN-DM 100-10 MG/5ML PO SYRP
5.0000 mL | ORAL_SOLUTION | ORAL | Status: DC | PRN
  Administered 2018-12-10: 5 mL via ORAL
  Filled 2018-12-09 (×3): qty 5

## 2018-12-09 MED ORDER — INSULIN REGULAR(HUMAN) IN NACL 100-0.9 UT/100ML-% IV SOLN
INTRAVENOUS | Status: DC
  Administered 2018-12-09: 10.8 [IU]/h via INTRAVENOUS
  Administered 2018-12-09: 5.4 [IU]/h via INTRAVENOUS

## 2018-12-09 MED ORDER — INSULIN ASPART 100 UNIT/ML ~~LOC~~ SOLN
8.0000 [IU] | Freq: Once | SUBCUTANEOUS | Status: AC
Start: 2018-12-09 — End: 2018-12-09
  Administered 2018-12-09: 8 [IU] via INTRAVENOUS
  Filled 2018-12-09: qty 1

## 2018-12-09 MED ORDER — DEXTROSE 50 % IV SOLN
14.0000 mL | Freq: Once | INTRAVENOUS | Status: AC
  Administered 2018-12-09: 14 mL via INTRAVENOUS
  Filled 2018-12-09: qty 50

## 2018-12-09 MED ORDER — INSULIN ASPART 100 UNIT/ML ~~LOC~~ SOLN
2.0000 [IU] | SUBCUTANEOUS | Status: DC
  Administered 2018-12-10: 2 [IU] via SUBCUTANEOUS
  Filled 2018-12-09: qty 1

## 2018-12-09 MED ORDER — INSULIN REGULAR(HUMAN) IN NACL 100-0.9 UT/100ML-% IV SOLN
INTRAVENOUS | Status: DC
  Filled 2018-12-09: qty 100

## 2018-12-09 MED ORDER — DEXTROSE 50 % IV SOLN
INTRAVENOUS | Status: AC
  Filled 2018-12-09: qty 50

## 2018-12-09 MED ORDER — ASPIRIN EC 81 MG PO TBEC
81.0000 mg | DELAYED_RELEASE_TABLET | Freq: Every day | ORAL | Status: DC
  Administered 2018-12-09 – 2018-12-10 (×2): 81 mg via ORAL
  Filled 2018-12-09 (×2): qty 1

## 2018-12-09 MED ORDER — SODIUM CHLORIDE 0.9 % IV BOLUS
1000.0000 mL | Freq: Once | INTRAVENOUS | Status: AC
  Administered 2018-12-09: 1000 mL via INTRAVENOUS

## 2018-12-09 MED ORDER — INSULIN GLARGINE 100 UNIT/ML ~~LOC~~ SOLN
15.0000 [IU] | Freq: Every day | SUBCUTANEOUS | Status: DC
  Filled 2018-12-09: qty 0.15

## 2018-12-09 MED ORDER — GUAIFENESIN ER 600 MG PO TB12
600.0000 mg | ORAL_TABLET | Freq: Two times a day (BID) | ORAL | Status: DC
  Administered 2018-12-09 – 2018-12-10 (×3): 600 mg via ORAL
  Filled 2018-12-09 (×3): qty 1

## 2018-12-09 MED ORDER — METOPROLOL TARTRATE 25 MG PO TABS
25.0000 mg | ORAL_TABLET | Freq: Every day | ORAL | Status: DC
  Administered 2018-12-10: 25 mg via ORAL
  Filled 2018-12-09: qty 1

## 2018-12-09 MED ORDER — ENOXAPARIN SODIUM 40 MG/0.4ML ~~LOC~~ SOLN
40.0000 mg | SUBCUTANEOUS | Status: DC
  Administered 2018-12-09: 40 mg via SUBCUTANEOUS
  Filled 2018-12-09: qty 0.4

## 2018-12-09 MED ORDER — SODIUM CHLORIDE 0.9 % IV SOLN: INTRAVENOUS | Status: AC

## 2018-12-09 MED ORDER — DEXTROSE-NACL 5-0.45 % IV SOLN
INTRAVENOUS | Status: DC
  Administered 2018-12-09: 20:00:00 via INTRAVENOUS

## 2018-12-09 MED ORDER — POTASSIUM CHLORIDE 10 MEQ/100ML IV SOLN: 10.0000 meq | INTRAVENOUS | Status: DC

## 2018-12-09 NOTE — Progress Notes (Signed)
eLink Physician-Brief Progress Note Patient Name: Susan Navarro DOB: [birth year] MRN: [id number]   Date of Service  12/09/2018  HPI/Events of Note  57/F admitted for DKA.  Reviewed labs - anion gap has closed.  Transition to SQ insulin.    eICU Interventions  Give 15 units lantus now.  Start on diabetic diet. Insulin sliding scale coverage qc and hs.     Intervention Category Major Interventions: Hyperglycemia - active titration of insulin therapy  [name] 12/09/2018, 11:36 PM

## 2018-12-09 NOTE — ED Triage Notes (Signed)
Pt to ED via EMS from home. Pt c/o n/v that started last night, pt states when she throws up it is clear in color. Pt states sister has been dx with the flu. Pt has non productive cough, hx of type 1 diabetes, CBG reading high on monitor.

## 2018-12-09 NOTE — ED Provider Notes (Signed)
[facility] Emergency Department Provider Note ____________________________________________   First MD Initiated Contact with Patient 12/09/18 1143     (approximate)  I have reviewed the triage vital signs and the nursing notes.   HISTORY  Chief Complaint Emesis and Nausea    HPI Susan Navarro is a 57 y.o. female with PMH as noted below including diabetes on insulin who presents with nausea and vomiting since last night, associated with cough, generalized weakness, and frequent urination.  She states that she recently has been spending time with her sister who has the flu.  She states she is compliant with her insulin and takes Lantus as well as short acting insulin with meals.   Past Medical History:  Diagnosis Date  . Diabetes mellitus without complication ([place])   . Hypertension     There are no active problems to display for this patient.   History reviewed. No pertinent surgical history.  Prior to Admission medications   Not on File    Allergies Patient has no known allergies.  History reviewed. No pertinent family history.  Social History Social History   Tobacco Use  . Smoking status: Never Smoker  . Smokeless tobacco: Never Used  Substance Use Topics  . Alcohol use: No  . Drug use: Not on file    Review of Systems  Constitutional: No fever/chills.  Positive for weakness. Eyes: No visual changes. ENT: No sore throat. Cardiovascular: Denies chest pain. Respiratory: Denies shortness of breath.  Positive for cough. Gastrointestinal: Positive for vomiting.  Genitourinary: Negative for dysuria.  Positive for frequency. Musculoskeletal: Negative for back pain. Skin: Negative for rash. Neurological: Negative for headaches.   ____________________________________________   PHYSICAL EXAM:  VITAL SIGNS: ED Triage Vitals  Enc Vitals Group     BP 12/09/18 1137 115/65     Pulse Rate 12/09/18 1137 (!) 129     Resp 12/09/18  1137 20     Temp 12/09/18 1137 (!) 97.4 F (36.3 C)     Temp Source 12/09/18 1137 Oral     SpO2 12/09/18 1137 96 %     Weight 12/09/18 1137 150 lb (68 kg)     Height 12/09/18 1137 5' 6"  (1.676 m)     Head Circumference --      Peak Flow --      Pain Score 12/09/18 1136 0     Pain Loc --      Pain Edu? --      Excl. in [place]? --     Constitutional: Alert and oriented.  Relatively comfortable appearing and in no acute distress. Eyes: Conjunctivae are normal.  Head: Atraumatic. Nose: No congestion/rhinnorhea. Mouth/Throat: Mucous membranes are dry. Neck: Normal range of motion.  Cardiovascular: Tachycardic, regular rhythm. Grossly normal heart sounds.  Good peripheral circulation. Respiratory: Normal respiratory effort.  No retractions. Lungs CTAB. Gastrointestinal: Soft and nontender. No distention.  Genitourinary: No flank tenderness. Musculoskeletal: No lower extremity edema.  Extremities warm and well perfused.  Neurologic:  Normal speech and language. No gross focal neurologic deficits are appreciated.  Skin:  Skin is warm and dry. No rash noted. Psychiatric: Mood and affect are normal. Speech and behavior are normal.  ____________________________________________   LABS (all labs ordered are listed, but only abnormal results are displayed)  Labs Reviewed  GLUCOSE, CAPILLARY - Abnormal; Notable for the following components:      Result Value   Glucose-Capillary >600 (*)    All other components within normal limits  COMPREHENSIVE METABOLIC  PANEL - Abnormal; Notable for the following components:   Sodium 125 (*)    Potassium 5.6 (*)    Chloride 83 (*)    Glucose, Bld 1,068 (*)    BUN 73 (*)    Creatinine, Ser 2.71 (*)    ALT 107 (*)    Alkaline Phosphatase 646 (*)    Total Bilirubin 1.3 (*)    GFR calc non Af Amer 19 (*)    GFR calc Af Amer 22 (*)    Anion gap 20 (*)    All other components within normal limits  CBC - Abnormal; Notable for the following components:     RBC 3.56 (*)    Hemoglobin 10.7 (*)    HCT 33.0 (*)    Platelets 82 (*)    All other components within normal limits  URINALYSIS, COMPLETE (UACMP) WITH MICROSCOPIC - Abnormal; Notable for the following components:   Color, Urine STRAW (*)    APPearance CLEAR (*)    Glucose, UA >=500 (*)    Hgb urine dipstick SMALL (*)    Ketones, ur 5 (*)    All other components within normal limits  BLOOD GAS, VENOUS - Abnormal; Notable for the following components:   Acid-base deficit 4.0 (*)    All other components within normal limits  GLUCOSE, CAPILLARY - Abnormal; Notable for the following components:   Glucose-Capillary >600 (*)    All other components within normal limits  LIPASE, BLOOD  INFLUENZA PANEL BY PCR (TYPE A & B)  CBG MONITORING, ED   ____________________________________________  EKG   ____________________________________________  RADIOLOGY  CXR: No focal infiltrate  ____________________________________________   PROCEDURES  Procedure(s) performed: No  Procedures  Critical Care performed: Yes  CRITICAL CARE Performed by: Arta Silence   Total critical care time: 30 minutes  Critical care time was exclusive of separately billable procedures and treating other patients.  Critical care was necessary to treat or prevent imminent or life-threatening deterioration.  Critical care was time spent personally by me on the following activities: development of treatment plan with patient and/or surrogate as well as nursing, discussions with consultants, evaluation of patient's response to treatment, examination of patient, obtaining history from patient or surrogate, ordering and performing treatments and interventions, ordering and review of laboratory studies, ordering and review of radiographic studies, pulse oximetry and re-evaluation of patient's condition. ____________________________________________   INITIAL IMPRESSION / ASSESSMENT AND PLAN / ED  COURSE  Pertinent labs & imaging results that were available during my care of the patient were reviewed by me and considered in my medical decision making (see chart for details).  57 year old female with history of diabetes on insulin presents with nausea and vomiting, cough, and weakness since last night.  She states her sister was recently diagnosed with flu.  On exam the patient is relatively well-appearing.  She is tachycardic but her other vital signs are normal.  The abdomen is soft and nontender.  She has dry mucous membranes.  Fingerstick shows glucose over 600.  Differential includes gastroenteritis, simple hypoglycemia, or less likely DKA.  We will also check the patient for flu and obtain a chest x-ray.  We will give fluids and insulin and reassess.  ----------------------------------------- 1:39 PM on 12/09/2018 -----------------------------------------  Glucose on the BMP is 1000.  The patient has an elevated anion gap but her pH is not significantly abnormal, so this is borderline DKA.  I ordered insulin via glucose stabilizer for the patient.  She will require admission.  I signed her out to the hospitalist.  ____________________________________________   FINAL CLINICAL IMPRESSION(S) / ED DIAGNOSES  Final diagnoses:  Hyperglycemia      NEW MEDICATIONS STARTED DURING THIS VISIT:  New Prescriptions   No medications on file     Note:  This document was prepared using Dragon voice recognition software and may include unintentional dictation errors.    Arta Silence, MD 12/09/18 1340

## 2018-12-09 NOTE — Progress Notes (Signed)
Admitted for DKA FSBS>1000, AG 20 Acute renal failure  arrestive fluid hydration Insulin infusion   SD monitoring

## 2018-12-09 NOTE — ED Notes (Signed)
Date and time results received: 12/09/18 12:47 PM  (use smartphrase ".now" to insert current time)  Test: glucose Critical Value: 1068  Name of Provider Notified: [email or web address]  Orders Received? Or Actions Taken?: fluids, cbg recheck in 15 min

## 2018-12-09 NOTE — ED Notes (Signed)
Insulin verified with megan rn

## 2018-12-09 NOTE — H&P (Addendum)
[facility] [place] NAME: Susan Navarro    MR#:  [id number]  DATE OF BIRTH:  [birth year]  DATE OF ADMISSION:  12/09/2018  PRIMARY CARE PHYSICIAN: Rochel Brome, MD   REQUESTING/REFERRING PHYSICIAN: Arta Silence, MD  CHIEF COMPLAINT:   Chief Complaint  Patient presents with  . Emesis  . Nausea    HISTORY OF PRESENT ILLNESS: Susan Navarro  is a 57 y.o. female with a known history of diabetes and hypertension who is presenting to the hospital with complaint of nausea vomiting and cough.  Patient states that she has been feeling sick yesterday.  He has not had any diarrhea.  She has had fevers but no chills.  Patient reports that she has a chronic right foot wound for which she goes to [facility]  And does dressing changes daily.  Patient reports normally her sugars in 2-300.  Today in the ER she is noted to have a blood sugar of 1000.   PAST MEDICAL HISTORY:   Past Medical History:  Diagnosis Date  . Diabetes mellitus without complication ([place])   . Hypertension     PAST SURGICAL HISTORY: History reviewed. No pertinent surgical history.  SOCIAL HISTORY:  Social History   Tobacco Use  . Smoking status: Never Smoker  . Smokeless tobacco: Never Used  Substance Use Topics  . Alcohol use: No    FAMILY HISTORY:  Family History  Problem Relation Age of Onset  . Diabetes Mother     DRUG ALLERGIES: No Known Allergies  REVIEW OF SYSTEMS:   CONSTITUTIONAL: No fever, fatigue or weakness.  EYES: No blurred or double vision.  EARS, NOSE, AND THROAT: No tinnitus or ear pain.  RESPIRATORY: No cough, shortness of breath, wheezing or hemoptysis.  CARDIOVASCULAR: No chest pain, orthopnea, edema.  GASTROINTESTINAL: No nausea, vomiting, diarrhea or abdominal pain.  GENITOURINARY: No dysuria, hematuria.  ENDOCRINE: No polyuria, nocturia,  HEMATOLOGY: No anemia, easy bruising or bleeding SKIN: Chronic right foot wound   mUSCULOSKELETAL: No joint pain or arthritis.   NEUROLOGIC: No tingling, numbness, weakness.  PSYCHIATRY: No anxiety or depression.   MEDICATIONS AT HOME:  Prior to Admission medications   Medication Sig Start Date End Date Taking? Authorizing Provider  acetaminophen (TYLENOL) 325 MG tablet Take 325 mg by mouth every 4 (four) hours as needed. 05/06/18 05/06/19 Yes [provider]  aspirin EC 81 MG tablet Take 81 mg by mouth daily. 02/09/17  Yes [provider]  atorvastatin (LIPITOR) 80 MG tablet Take 80 mg by mouth every evening. 11/23/18 11/23/19 Yes [provider]  Insulin Glargine (LANTUS SOLOSTAR) 100 UNIT/ML Solostar Pen Inject 15 Units into the skin daily. 11/30/18  Yes [provider]  insulin lispro (HUMALOG KWIKPEN) 100 UNIT/ML KwikPen Inject 5 Units into the skin 3 (three) times daily before meals. 11/30/18 11/30/19 Yes [provider]  lisinopril (PRINIVIL,ZESTRIL) 2.5 MG tablet Take 2.5 mg by mouth daily. 11/23/18 11/23/19 Yes [provider]  metoprolol tartrate (LOPRESSOR) 25 MG tablet Take 25 mg by mouth daily. 11/23/18 11/23/19 Yes [provider]      PHYSICAL EXAMINATION:   VITAL SIGNS: Blood pressure 139/62, pulse 90, temperature (!) 97.4 F (36.3 C), temperature source Oral, resp. rate 19, height 5' 6"  (1.676 m), weight 59 kg, SpO2 100 %.  GENERAL:  57 y.o.-year-old patient lying in the bed mild distress EYES: Pupils equal, round, reactive to light and accommodation. No scleral icterus. Extraocular muscles intact.  HEENT: Head  atraumatic, normocephalic. Oropharynx and nasopharynx clear.  NECK:  Supple, no jugular venous distention. No thyroid enlargement, no tenderness.  LUNGS: Normal breath sounds bilaterally, no wheezing, rales,rhonchi or crepitation. No use of accessory muscles of respiration.  CARDIOVASCULAR: S1, S2 normal. No murmurs, rubs, or gallops.  ABDOMEN: Soft, nontender, nondistended. Bowel sounds  present. No organomegaly or mass.  EXTREMITIES: Right foot wound.  NEUROLOGIC: Cranial nerves II through XII are intact. Muscle strength 5/5 in all extremities. Sensation intact. Gait not checked.  PSYCHIATRIC: The patient is alert and oriented x 3.  SKIN: No obvious rash, lesion, or ulcer.   LABORATORY PANEL:   CBC Recent Labs  Lab 12/09/18 1203  WBC 9.4  HGB 10.7*  HCT 33.0*  PLT 82*  MCV 92.7  MCH 30.1  MCHC 32.4  RDW 12.7   ------------------------------------------------------------------------------------------------------------------  Chemistries  Recent Labs  Lab 12/09/18 1203  NA 125*  K 5.6*  CL 83*  CO2 22  GLUCOSE 1,068*  BUN 73*  CREATININE 2.71*  CALCIUM 10.0  AST 32  ALT 107*  ALKPHOS 646*  BILITOT 1.3*   ------------------------------------------------------------------------------------------------------------------ estimated creatinine clearance is 21.1 mL/min (A) (by C-G formula based on SCr of 2.71 mg/dL (H)). ------------------------------------------------------------------------------------------------------------------ No results for input(s): TSH, T4TOTAL, T3FREE, THYROIDAB in the last 72 hours.  Invalid input(s): FREET3   Coagulation profile No results for input(s): INR, PROTIME in the last 168 hours. ------------------------------------------------------------------------------------------------------------------- No results for input(s): DDIMER in the last 72 hours. -------------------------------------------------------------------------------------------------------------------  Cardiac Enzymes No results for input(s): CKMB, TROPONINI, MYOGLOBIN in the last 168 hours.  Invalid input(s): CK ------------------------------------------------------------------------------------------------------------------ Invalid input(s):  POCBNP  ---------------------------------------------------------------------------------------------------------------  Urinalysis    Component Value Date/Time   COLORURINE STRAW (A) 12/09/2018 1203   APPEARANCEUR CLEAR (A) 12/09/2018 1203   LABSPEC 1.016 12/09/2018 1203   PHURINE 6.0 12/09/2018 1203   GLUCOSEU >=500 (A) 12/09/2018 1203   HGBUR SMALL (A) 12/09/2018 1203   BILIRUBINUR NEGATIVE 12/09/2018 1203   KETONESUR 5 (A) 12/09/2018 1203   PROTEINUR NEGATIVE 12/09/2018 1203   NITRITE NEGATIVE 12/09/2018 1203   LEUKOCYTESUR NEGATIVE 12/09/2018 1203     RADIOLOGY: Dg Chest 2 View  Result Date: 12/09/2018 CLINICAL DATA:  58 year old female with flu like symptoms beginning last night. Productive cough. EXAM: CHEST - 2 VIEW COMPARISON:  None. FINDINGS: Seated AP and lateral views of the chest. Normal cardiac size and mediastinal contours. Lung volumes are at the upper limits of normal. Visualized tracheal air column is within normal limits. No pneumothorax, pulmonary edema, pleural effusion or confluent pulmonary opacity. There is some attenuation of upper lobe bronchovascular markings. No acute osseous abnormality identified. Negative visible bowel gas pattern. IMPRESSION: 1. No acute cardiopulmonary abnormality. 2. Possible emphysema. Electronically Signed   By: Genevie Ann M.D.   On: 12/09/2018 13:04    EKG: Orders placed or performed during the hospital encounter of 02/25/17  . ED EKG  . ED EKG  . EKG 12-Lead  . EKG 12-Lead  . EKG    IMPRESSION AND PLAN: Patient is a 57 year old with diabetes presenting to the emergency room with complaint of nausea vomiting cough  1.  DKA we will treat her with insulin drip Once anion gap closes we can transition her back to insulin. Monitor BMP closely I have notified intensivist regarding admission to the ICU  2.  Acute renal failure due to severe dehydration give IV fluids follow renal function Hyponatremia pseudohyponatremia due to  elevated blood sugar should normalize with IV fluids and decrease in blood  sugar  3.  Cough fevers flu has been ruled out Possible bronchitis treat with azithromycin  4.  Chronic right lower extremity wound wound care consult  5.  Anemia likely anemia of chronic disease  6.  Abnormal LFTs will follow CMP after hydration if still elevated needs further work-up  7.  Miscellaneous Lovenox for DVT prophylaxis  All the records are reviewed and case discussed with ED provider. Management plans discussed with the patient, family and they are in agreement.  CODE STATUS:    Code Status Orders  (From admission, onward)         Start     Ordered   12/09/18 1344  Full code  Continuous     12/09/18 1344        Code Status History    This patient has a current code status but no historical code status.       TOTAL TIME TAKING CARE OF THIS PATIENT: 55 minutes critical care time spent    Dustin Flock M.D on 12/09/2018 at 3:28 PM  Between 7am to 6pm - Pager - [phone]  After 6pm go to [email or web address] - password [email or web address] [facility]  [phone]  CC: Primary care physician; Rochel Brome, MD

## 2018-12-10 ENCOUNTER — Inpatient Hospital Stay: Payer: Medicare Other

## 2018-12-10 DIAGNOSIS — E1110 Type 2 diabetes mellitus with ketoacidosis without coma: Secondary | ICD-10-CM | POA: Diagnosis not present

## 2018-12-10 DIAGNOSIS — R739 Hyperglycemia, unspecified: Secondary | ICD-10-CM | POA: Diagnosis not present

## 2018-12-10 LAB — GLUCOSE, CAPILLARY
GLUCOSE-CAPILLARY: 91 mg/dL (ref 70–99)
Glucose-Capillary: 100 mg/dL — ABNORMAL HIGH (ref 70–99)
Glucose-Capillary: 139 mg/dL — ABNORMAL HIGH (ref 70–99)
Glucose-Capillary: 144 mg/dL — ABNORMAL HIGH (ref 70–99)
Glucose-Capillary: 310 mg/dL — ABNORMAL HIGH (ref 70–99)
Glucose-Capillary: 73 mg/dL (ref 70–99)

## 2018-12-10 LAB — BASIC METABOLIC PANEL
ANION GAP: 7 (ref 5–15)
Anion gap: 8 (ref 5–15)
Anion gap: 9 (ref 5–15)
BUN: 66 mg/dL — ABNORMAL HIGH (ref 6–20)
BUN: 71 mg/dL — ABNORMAL HIGH (ref 6–20)
BUN: 68 mg/dL — ABNORMAL HIGH (ref 6–20)
CALCIUM: 8.7 mg/dL — AB (ref 8.9–10.3)
CO2: 26 mmol/L (ref 22–32)
CO2: 25 mmol/L (ref 22–32)
CO2: 23 mmol/L (ref 22–32)
CREATININE: 2.52 mg/dL — AB (ref 0.44–1.00)
Calcium: 9.3 mg/dL (ref 8.9–10.3)
Calcium: 9 mg/dL (ref 8.9–10.3)
Chloride: 102 mmol/L (ref 98–111)
Chloride: 105 mmol/L (ref 98–111)
Chloride: 101 mmol/L (ref 98–111)
Creatinine, Ser: 2.36 mg/dL — ABNORMAL HIGH (ref 0.44–1.00)
Creatinine, Ser: 2.52 mg/dL — ABNORMAL HIGH (ref 0.44–1.00)
GFR calc Af Amer: 24 mL/min — ABNORMAL LOW (ref >60)
GFR calc Af Amer: 25 mL/min — ABNORMAL LOW (ref >60)
GFR calc non Af Amer: 20 mL/min — ABNORMAL LOW (ref >60)
GFR calc non Af Amer: 22 mL/min — ABNORMAL LOW (ref >60)
GFR calc non Af Amer: 20 mL/min — ABNORMAL LOW (ref >60)
GFR, EST AFRICAN AMERICAN: 24 mL/min — AB (ref >60)
Glucose, Bld: 144 mg/dL — ABNORMAL HIGH (ref 70–99)
Glucose, Bld: 105 mg/dL — ABNORMAL HIGH (ref 70–99)
Glucose, Bld: 245 mg/dL — ABNORMAL HIGH (ref 70–99)
Potassium: 3.9 mmol/L (ref 3.5–5.1)
Potassium: 3.6 mmol/L (ref 3.5–5.1)
Potassium: 4.4 mmol/L (ref 3.5–5.1)
Sodium: 136 mmol/L (ref 135–145)
Sodium: 137 mmol/L (ref 135–145)
Sodium: 133 mmol/L — ABNORMAL LOW (ref 135–145)

## 2018-12-10 LAB — HEPATIC FUNCTION PANEL
ALT: 80 U/L — ABNORMAL HIGH (ref 0–44)
AST: 31 U/L (ref 15–41)
Albumin: 3.3 g/dL — ABNORMAL LOW (ref 3.5–5.0)
Alkaline Phosphatase: 388 U/L — ABNORMAL HIGH (ref 38–126)
Bilirubin, Direct: 0.3 mg/dL — ABNORMAL HIGH (ref 0.0–0.2)
Indirect Bilirubin: 0.4 mg/dL (ref 0.3–0.9)
Total Bilirubin: 0.7 mg/dL (ref 0.3–1.2)
Total Protein: 5.7 g/dL — ABNORMAL LOW (ref 6.5–8.1)

## 2018-12-10 MED ORDER — ACETAMINOPHEN 325 MG PO TABS
650.0000 mg | ORAL_TABLET | Freq: Four times a day (QID) | ORAL | Status: DC | PRN
  Administered 2018-12-10: 650 mg via ORAL
  Filled 2018-12-10: qty 2

## 2018-12-10 MED ORDER — INSULIN GLARGINE 100 UNIT/ML ~~LOC~~ SOLN
15.0000 [IU] | Freq: Every day | SUBCUTANEOUS | Status: DC
  Filled 2018-12-10: qty 0.15

## 2018-12-10 MED ORDER — PNEUMOCOCCAL VAC POLYVALENT 25 MCG/0.5ML IJ INJ
0.5000 mL | INJECTION | Freq: Once | INTRAMUSCULAR | Status: AC
  Administered 2018-12-10: 0.5 mL via INTRAMUSCULAR
  Filled 2018-12-10: qty 0.5

## 2018-12-10 MED ORDER — PNEUMOCOCCAL VAC POLYVALENT 25 MCG/0.5ML IJ INJ: 0.5000 mL | INJECTION | INTRAMUSCULAR | Status: DC

## 2018-12-10 MED ORDER — INSULIN ASPART 100 UNIT/ML ~~LOC~~ SOLN: 0.0000 [IU] | Freq: Every day | SUBCUTANEOUS | Status: DC

## 2018-12-10 MED ORDER — SODIUM CHLORIDE 0.9 % IV SOLN
INTRAVENOUS | Status: DC
  Administered 2018-12-10: 08:00:00 via INTRAVENOUS

## 2018-12-10 MED ORDER — AZITHROMYCIN 500 MG PO TABS: 500.0000 mg | ORAL_TABLET | Freq: Every day | ORAL | 0 refills | Status: DC

## 2018-12-10 MED ORDER — SODIUM CHLORIDE 0.9 % IV BOLUS
1000.0000 mL | Freq: Once | INTRAVENOUS | Status: AC
  Administered 2018-12-10: 1000 mL via INTRAVENOUS

## 2018-12-10 MED ORDER — INSULIN GLARGINE 100 UNIT/ML ~~LOC~~ SOLN
15.0000 [IU] | Freq: Every day | SUBCUTANEOUS | Status: DC
  Administered 2018-12-10: 15 [IU] via SUBCUTANEOUS
  Filled 2018-12-10 (×2): qty 0.15

## 2018-12-10 MED ORDER — INSULIN ASPART 100 UNIT/ML ~~LOC~~ SOLN
0.0000 [IU] | Freq: Three times a day (TID) | SUBCUTANEOUS | Status: DC
  Administered 2018-12-10: 7 [IU] via SUBCUTANEOUS
  Filled 2018-12-10: qty 1

## 2018-12-10 MED ORDER — ENOXAPARIN SODIUM 30 MG/0.3ML ~~LOC~~ SOLN: 30.0000 mg | SUBCUTANEOUS | Status: DC

## 2018-12-10 NOTE — Progress Notes (Signed)
Discharge instructions given to and reviewed with patient including follow up appointment and that she needs to call and make an appointment with Dr. Candiss Norse since his office was closed when attempted to call and make an appointment prior to discharge.  Patient alert with no distress noted.  Voided twice this shift in commode.  Tolerated diet eating 30% of lunch.  Patient discharged home with Volunteer taking patient to visitors entrance by wheelchair where patient's sister is picking her up.

## 2018-12-10 NOTE — Progress Notes (Signed)
Spoke with Dr. Mortimer Fries about patient's minimal urine output and renal function.  MD gave order for normal saline at 50 ml/H.

## 2018-12-10 NOTE — Care Management Note (Signed)
Case Management Note  Patient Details  Name: Cipriana Biller MRN: [id number] Date of Birth: [birth year]  Subjective/Objective:    Patient admitted with hyperglycemia.  Patient has discharge orders in.  Patient is from home, lives alone in [place].  Patient reports that her boyfriend stays with her on the weekends.  Patient reports that she has a cane and walker.  Patient is independent in ADL's.  Patient does not drive, [employer] provides transportation to appointments.  Patient is open with [facility].  Cory with [facility] notified of patient's discharge today and resumption of services.  Medicare approved [facility] agency list provided to patient, she would like to stay with [facility].  Patient's sister is coming to pick her up today when discharged. Doran Clay RN BSN [phone]                 Action/Plan: Discharge home with [facility]  Expected Discharge Date:  12/10/18               Expected Discharge Plan:  [place]  In-House Referral:     Discharge planning Services  CM Consult  Post Acute Care Choice:  Home Health Choice offered to:  Patient  DME Arranged:    DME Agency:     HH Arranged:  RN, PT, OT, Social Work, Nurse's Aide [place] Agency:  [facility]  Status of Service:  Completed, signed off  If discussed at [employer] [facility] [employer], dates discussed:    Additional Comments:  Shelbie Hutching, RN 12/10/2018, 11:58 AM

## 2018-12-10 NOTE — Discharge Summary (Signed)
[facility] [place] NAME: Susan Navarro    MR#:  [id number]  DATE OF BIRTH:  Jul 05, 1961  DATE OF ADMISSION:  12/09/2018 ADMITTING PHYSICIAN: Dustin Flock, MD  DATE OF DISCHARGE: 12/10/2018  PRIMARY CARE PHYSICIAN: Rochel Brome, MD    ADMISSION DIAGNOSIS:  Hyperglycemia [R73.9]  DISCHARGE DIAGNOSIS:  Active Problems:   DKA (diabetic ketoacidoses) ([place])   SECONDARY DIAGNOSIS:   Past Medical History:  Diagnosis Date  . Diabetes mellitus without complication ([place])   . Hypertension     HOSPITAL COURSE:   57 year old female with a history of diabetes who presented in DKA.  1. DKA: Patient was on DKA ICU protocol. DKA has resolved.  She will continue outpatient regimen.  She will need close follow-up with PCP.  2.  Acute on chronic kidney disease stage III: Increased creatinine was due to dehydration and DKA.  Creatinine has improved.  However it is still not at baseline.  She will continue hydration and follow-up with nephrology as an outpatient.  She will avoid nephrotoxic medications. Renal ultrasound showed no evidence of hydronephrosis.  It does show medical renal disease.   3.  Recently debrided wound to right plantar foot: Patient was evaluated by wound care.  She will have outpatient follow-up with her wound care clinician.  Recommendations are for Vaseline gauze to promote moist healing.  4.  Acute bronchitis: Patient will be discharged on oral azithromycin  5.  Anemia of chronic disease: Patient had stable hemoglobin  6.  Elevated LFTs which improved after hydration  DISCHARGE CONDITIONS AND DIET:   st[place]ble for discharge on diabetic diet  CONSULTS OBTAINED:  Treatment Team:  Flora Lipps, MD  DRUG ALLERGIES:  No Known Allergies  DISCHARGE MEDICATIONS:   Allergies as of 12/10/2018   No Known Allergies     Medication List    STOP taking these medications   lisinopril 2.5 MG tablet Commonly known  as:  PRINIVIL,ZESTRIL     TAKE these medications   acetaminophen 325 MG tablet Commonly known as:  TYLENOL Take 325 mg by mouth every 4 (four) hours as needed.   aspirin EC 81 MG tablet Take 81 mg by mouth daily.   atorvastatin 80 MG tablet Commonly known as:  LIPITOR Take 80 mg by mouth every evening.   azithromycin 500 MG tablet Commonly known as:  ZITHROMAX Take 1 tablet (500 mg total) by mouth daily for 3 days. Take 1 tablet daily for 3 days.   HUMALOG KWIKPEN 100 UNIT/ML KwikPen Generic drug:  insulin lispro Inject 5 Units into the skin 3 (three) times daily before meals.   LANTUS SOLOSTAR 100 UNIT/ML Solostar Pen Generic drug:  Insulin Glargine Inject 15 Units into the skin daily.   metoprolol tartrate 25 MG tablet Commonly known as:  LOPRESSOR Take 25 mg by mouth daily.            Discharge Care Instructions  (From admission, onward)         Start     Ordered   12/10/18 0000  Discharge wound care:    Comments:  Vaseline gauze to promote moist healing. Pt states they also apply ace wrap   12/10/18 1037            Today   CHIEF COMPLAINT:   She is doing well and would like to go home   VITAL SIGNS:  Blood pressure (!) 142/69, pulse 91, temperature 99.4 F (37.4 C), temperature source Oral,  resp. rate 18, height 5' 6"  (1.676 m), weight 59 kg, SpO2 99 %.   REVIEW OF SYSTEMS:  Review of Systems  Constitutional: Negative.  Negative for chills, fever and malaise/fatigue.  HENT: Negative.  Negative for ear discharge, ear pain, hearing loss, nosebleeds and sore throat.   Eyes: Negative.  Negative for blurred vision and pain.  Respiratory: Negative.  Negative for cough, hemoptysis, shortness of breath and wheezing.   Cardiovascular: Negative.  Negative for chest pain, palpitations and leg swelling.  Gastrointestinal: Negative.  Negative for abdominal pain, blood in stool, diarrhea, nausea and vomiting.  Genitourinary: Negative.  Negative for  dysuria.  Musculoskeletal: Negative.  Negative for back pain.  Skin:       Chronic wound on foot  Neurological: Negative for dizziness, tremors, speech change, focal weakness, seizures and headaches.  Endo/Heme/Allergies: Negative.  Does not bruise/bleed easily.  Psychiatric/Behavioral: Negative.  Negative for depression, hallucinations and suicidal ideas.     PHYSICAL EXAMINATION:  GENERAL:  57 y.o.-year-old patient lying in the bed with no acute distress.  NECK:  Supple, no jugular venous distention. No thyroid enlargement, no tenderness.  LUNGS: Normal breath sounds bilaterally, no wheezing, rales,rhonchi  No use of accessory muscles of respiration.  CARDIOVASCULAR: S1, S2 normal. No murmurs, rubs, or gallops.  ABDOMEN: Soft, non-tender, non-distended. Bowel sounds present. No organomegaly or mass.  EXTREMITIES: No pedal edema, cyanosis, or clubbing.  PSYCHIATRIC: The patient is alert and oriented x 3.  SKIN: small healed wound foot no discharge or odor.   DATA REVIEW:   CBC Recent Labs  Lab 12/09/18 1531  WBC 11.4*  HGB 11.1*  HCT 32.0*  PLT 79*    Chemistries  Recent Labs  Lab 12/10/18 0137  12/10/18 0959  NA 136   < > 133*  K 3.9   < > 4.4  CL 102   < > 101  CO2 26   < > 23  GLUCOSE 144*   < > 245*  BUN 66*   < > 68*  CREATININE 2.52*   < > 2.52*  CALCIUM 9.3   < > 8.7*  AST 31  --   --   ALT 80*  --   --   ALKPHOS 388*  --   --   BILITOT 0.7  --   --    < > = values in this interval not displayed.    Cardiac Enzymes No results for input(s): TROPONINI in the last 168 hours.  Microbiology Results  @MICRORSLT48 @  RADIOLOGY:  Dg Chest 2 View  Result Date: 12/09/2018 CLINICAL DATA:  57 year old female with flu like symptoms beginning last night. Productive cough. EXAM: CHEST - 2 VIEW COMPARISON:  None. FINDINGS: Seated AP and lateral views of the chest. Normal cardiac size and mediastinal contours. Lung volumes are at the upper limits of normal.  Visualized tracheal air column is within normal limits. No pneumothorax, pulmonary edema, pleural effusion or confluent pulmonary opacity. There is some attenuation of upper lobe bronchovascular markings. No acute osseous abnormality identified. Negative visible bowel gas pattern. IMPRESSION: 1. No acute cardiopulmonary abnormality. 2. Possible emphysema. Electronically Signed   By: Genevie Ann M.D.   On: 12/09/2018 13:04   US Renal  Result Date: 12/10/2018 CLINICAL DATA:  Hydronephrosis EXAM: RENAL / URINARY TRACT ULTRASOUND COMPLETE COMPARISON:  None. FINDINGS: Right Kidney: Renal measurements: 8.8 x 3.6 x 5.1 cm = volume: 84 mL. Slightly increased echotexture throughout the right kidney. No mass or hydronephrosis. Left Kidney: Renal  measurements: 9.1 x 4.1 x 3.5 cm = volume: 69 mL. Slight increased echotexture throughout the left kidney. No mass or hydronephrosis. Bladder: Appears normal for degree of bladder distention. IMPRESSION: Slight increased echotexture bilaterally suggesting chronic medical renal disease. No acute findings. No hydronephrosis. Electronically Signed   By: Rolm Baptise M.D.   On: 12/10/2018 11:32      Allergies as of 12/10/2018   No Known Allergies     Medication List    STOP taking these medications   lisinopril 2.5 MG tablet Commonly known as:  PRINIVIL,ZESTRIL     TAKE these medications   acetaminophen 325 MG tablet Commonly known as:  TYLENOL Take 325 mg by mouth every 4 (four) hours as needed.   aspirin EC 81 MG tablet Take 81 mg by mouth daily.   atorvastatin 80 MG tablet Commonly known as:  LIPITOR Take 80 mg by mouth every evening.   azithromycin 500 MG tablet Commonly known as:  ZITHROMAX Take 1 tablet (500 mg total) by mouth daily for 3 days. Take 1 tablet daily for 3 days.   HUMALOG KWIKPEN 100 UNIT/ML KwikPen Generic drug:  insulin lispro Inject 5 Units into the skin 3 (three) times daily before meals.   LANTUS SOLOSTAR 100 UNIT/ML Solostar  Pen Generic drug:  Insulin Glargine Inject 15 Units into the skin daily.   metoprolol tartrate 25 MG tablet Commonly known as:  LOPRESSOR Take 25 mg by mouth daily.            Discharge Care Instructions  (From admission, onward)         Start     Ordered   12/10/18 0000  Discharge wound care:    Comments:  Vaseline gauze to promote moist healing. Pt states they also apply ace wrap   12/10/18 1037            Management plans discussed with the patient and she is in agreement. Stable for discharge home  Patient should follow up with pcp  CODE STATUS:     Code Status Orders  (From admission, onward)         Start     Ordered   12/09/18 1344  Full code  Continuous     12/09/18 1344        Code Status History    This patient has a current code status but no historical code status.      TOTAL TIME TAKING CARE OF THIS PATIENT: 36 minutes.    Note: This dictation was prepared with Dragon dictation along with smaller phrase technology. Any transcriptional errors that result from this process are unintentional.  Violet Seabury M.D on 12/10/2018 at 12:22 PM  Between 7am to 6pm - Pager - [phone] After 6pm go to [email or web address] - password [email or web address] [place] [facility]  Office  [phone]  CC: Primary care physician; Rochel Brome, MD

## 2018-12-10 NOTE — Progress Notes (Signed)
eLink Physician-Brief Progress Note Patient Name: Susan Navarro DOB: January 26, 1961 MRN: [id number]   Date of Service  12/10/2018  HPI/Events of Note  Pt has decreased urine output~155m all night.  Pt has been getting IVFs @ 108mhr.    eICU Interventions  Give 1L NS bolus. D/C D5NS at this point.       Intervention Category Intermediate Interventions: Oliguria - evaluation and management  VaElsie Lincoln2/20/2019, 4:40 AM

## 2018-12-10 NOTE — Progress Notes (Signed)
Pharmacy lovenox dose adjustment. Patient ordered lovenox 40 mg subq daily.  Per patient's CrCl < 30 ml/min will reduce lovenox dose to 30 mg subq daily for VTE prophylaxis.  Tobie Lords, PharmD, BCPS Clinical Pharmacist 12/10/2018

## 2018-12-10 NOTE — Consult Note (Signed)
Lead Nurse wound consult note Reason for Consult: Consult requested for right foot wound.  Pt states she is followed by the outpatient wound care center prior to admission and they recently debrided a callous/wound area to right plantar foot Wound type: Chronic full thickness Measurement: .8X1X.1cm Wound bed: black dry inner wound; no odor, drainage, or fluctuance Periwound: Loose outer skin removed easily surrounding wound Dressing procedure/placement/frequency: Vaseline gauze to promote moist healing. Pt states they also apply ace wrap; continue present plan of care.  She should resume follow-up with the wound center after discharge. Please re-consult if further assistance is needed.  Thank-you,  Julien Girt MSN, [place], [place], [address], [place]

## 2018-12-11 LAB — HIV ANTIBODY (ROUTINE TESTING W REFLEX): HIV Screen 4th Generation wRfx: NONREACTIVE

## 2018-12-13 ENCOUNTER — Other Ambulatory Visit: Payer: Self-pay

## 2018-12-13 ENCOUNTER — Encounter: Payer: Self-pay | Admitting: Emergency Medicine

## 2018-12-13 ENCOUNTER — Inpatient Hospital Stay
Admission: EM | Admit: 2018-12-13 | Discharge: 2018-12-15 | DRG: 638 | Disposition: A | Discharge status: Home Health | Payer: Medicare Other | Attending: Internal Medicine | Admitting: Internal Medicine

## 2018-12-13 DIAGNOSIS — E860 Dehydration: Secondary | ICD-10-CM | POA: Diagnosis present

## 2018-12-13 DIAGNOSIS — E871 Hypo-osmolality and hyponatremia: Secondary | ICD-10-CM | POA: Diagnosis not present

## 2018-12-13 DIAGNOSIS — N179 Acute kidney failure, unspecified: Secondary | ICD-10-CM | POA: Diagnosis present

## 2018-12-13 DIAGNOSIS — E1110 Type 2 diabetes mellitus with ketoacidosis without coma: Secondary | ICD-10-CM | POA: Diagnosis not present

## 2018-12-13 DIAGNOSIS — Z833 Family history of diabetes mellitus: Secondary | ICD-10-CM

## 2018-12-13 DIAGNOSIS — Z79899 Other long term (current) drug therapy: Secondary | ICD-10-CM | POA: Diagnosis not present

## 2018-12-13 DIAGNOSIS — N183 Chronic kidney disease, stage 3 (moderate): Secondary | ICD-10-CM | POA: Diagnosis not present

## 2018-12-13 DIAGNOSIS — R112 Nausea with vomiting, unspecified: Secondary | ICD-10-CM

## 2018-12-13 DIAGNOSIS — E1010 Type 1 diabetes mellitus with ketoacidosis without coma: Secondary | ICD-10-CM

## 2018-12-13 DIAGNOSIS — Z7982 Long term (current) use of aspirin: Secondary | ICD-10-CM

## 2018-12-13 DIAGNOSIS — E875 Hyperkalemia: Secondary | ICD-10-CM | POA: Diagnosis not present

## 2018-12-13 DIAGNOSIS — E1122 Type 2 diabetes mellitus with diabetic chronic kidney disease: Secondary | ICD-10-CM | POA: Diagnosis not present

## 2018-12-13 DIAGNOSIS — E1310 Other specified diabetes mellitus with ketoacidosis without coma: Secondary | ICD-10-CM

## 2018-12-13 DIAGNOSIS — R739 Hyperglycemia, unspecified: Secondary | ICD-10-CM | POA: Diagnosis not present

## 2018-12-13 DIAGNOSIS — I129 Hypertensive chronic kidney disease with stage 1 through stage 4 chronic kidney disease, or unspecified chronic kidney disease: Secondary | ICD-10-CM | POA: Diagnosis not present

## 2018-12-13 DIAGNOSIS — Z794 Long term (current) use of insulin: Secondary | ICD-10-CM | POA: Diagnosis not present

## 2018-12-13 DIAGNOSIS — J101 Influenza due to other identified influenza virus with other respiratory manifestations: Secondary | ICD-10-CM | POA: Diagnosis not present

## 2018-12-13 LAB — COMPREHENSIVE METABOLIC PANEL
ALT: 143 U/L — ABNORMAL HIGH (ref 0–44)
AST: 53 U/L — ABNORMAL HIGH (ref 15–41)
Albumin: 3.6 g/dL (ref 3.5–5.0)
Alkaline Phosphatase: 432 U/L — ABNORMAL HIGH (ref 38–126)
Anion gap: 25 — ABNORMAL HIGH (ref 5–15)
BUN: 69 mg/dL — ABNORMAL HIGH (ref 6–20)
CO2: 12 mmol/L — ABNORMAL LOW (ref 22–32)
Calcium: 8.6 mg/dL — ABNORMAL LOW (ref 8.9–10.3)
Chloride: 92 mmol/L — ABNORMAL LOW (ref 98–111)
Creatinine, Ser: 2.62 mg/dL — ABNORMAL HIGH (ref 0.44–1.00)
GFR calc Af Amer: 22 mL/min — ABNORMAL LOW (ref >60)
GFR calc non Af Amer: 19 mL/min — ABNORMAL LOW (ref >60)
Glucose, Bld: 682 mg/dL (ref 70–99)
Potassium: 5.8 mmol/L — ABNORMAL HIGH (ref 3.5–5.1)
Sodium: 129 mmol/L — ABNORMAL LOW (ref 135–145)
Total Bilirubin: 1.5 mg/dL — ABNORMAL HIGH (ref 0.3–1.2)
Total Protein: 6.4 g/dL — ABNORMAL LOW (ref 6.5–8.1)

## 2018-12-13 LAB — GLUCOSE, CAPILLARY
GLUCOSE-CAPILLARY: 150 mg/dL — AB (ref 70–99)
Glucose-Capillary: >600 mg/dL (ref 70–99)
Glucose-Capillary: >600 mg/dL (ref 70–99)
Glucose-Capillary: 515 mg/dL (ref 70–99)
Glucose-Capillary: 493 mg/dL — ABNORMAL HIGH (ref 70–99)
Glucose-Capillary: 410 mg/dL — ABNORMAL HIGH (ref 70–99)
Glucose-Capillary: 394 mg/dL — ABNORMAL HIGH (ref 70–99)
Glucose-Capillary: 374 mg/dL — ABNORMAL HIGH (ref 70–99)
Glucose-Capillary: 299 mg/dL — ABNORMAL HIGH (ref 70–99)
Glucose-Capillary: 209 mg/dL — ABNORMAL HIGH (ref 70–99)
Glucose-Capillary: 229 mg/dL — ABNORMAL HIGH (ref 70–99)
Glucose-Capillary: 61 mg/dL — ABNORMAL LOW (ref 70–99)
Glucose-Capillary: 92 mg/dL (ref 70–99)

## 2018-12-13 LAB — BASIC METABOLIC PANEL
Anion gap: 24 — ABNORMAL HIGH (ref 5–15)
Anion gap: 12 (ref 5–15)
Anion gap: 11 (ref 5–15)
BUN: 70 mg/dL — ABNORMAL HIGH (ref 6–20)
BUN: 66 mg/dL — ABNORMAL HIGH (ref 6–20)
BUN: 68 mg/dL — ABNORMAL HIGH (ref 6–20)
CHLORIDE: 97 mmol/L — AB (ref 98–111)
CO2: 11 mmol/L — ABNORMAL LOW (ref 22–32)
CO2: 19 mmol/L — ABNORMAL LOW (ref 22–32)
CO2: 18 mmol/L — ABNORMAL LOW (ref 22–32)
Calcium: 8 mg/dL — ABNORMAL LOW (ref 8.9–10.3)
Calcium: 7.8 mg/dL — ABNORMAL LOW (ref 8.9–10.3)
Calcium: 8 mg/dL — ABNORMAL LOW (ref 8.9–10.3)
Chloride: 102 mmol/L (ref 98–111)
Chloride: 110 mmol/L (ref 98–111)
Creatinine, Ser: 2.7 mg/dL — ABNORMAL HIGH (ref 0.44–1.00)
Creatinine, Ser: 2.5 mg/dL — ABNORMAL HIGH (ref 0.44–1.00)
Creatinine, Ser: 2.44 mg/dL — ABNORMAL HIGH (ref 0.44–1.00)
GFR calc Af Amer: 22 mL/min — ABNORMAL LOW (ref >60)
GFR calc Af Amer: 24 mL/min — ABNORMAL LOW (ref >60)
GFR calc Af Amer: 24 mL/min — ABNORMAL LOW (ref >60)
GFR calc non Af Amer: 19 mL/min — ABNORMAL LOW (ref >60)
GFR calc non Af Amer: 20 mL/min — ABNORMAL LOW (ref >60)
GFR calc non Af Amer: 21 mL/min — ABNORMAL LOW (ref >60)
Glucose, Bld: 627 mg/dL (ref 70–99)
Glucose, Bld: 404 mg/dL — ABNORMAL HIGH (ref 70–99)
Glucose, Bld: 225 mg/dL — ABNORMAL HIGH (ref 70–99)
POTASSIUM: 4.1 mmol/L (ref 3.5–5.1)
POTASSIUM: 4.2 mmol/L (ref 3.5–5.1)
Potassium: 5.3 mmol/L — ABNORMAL HIGH (ref 3.5–5.1)
SODIUM: 133 mmol/L — AB (ref 135–145)
Sodium: 132 mmol/L — ABNORMAL LOW (ref 135–145)
Sodium: 139 mmol/L (ref 135–145)

## 2018-12-13 LAB — HEMOGLOBIN A1C
Hgb A1c MFr Bld: 9.8 % — ABNORMAL HIGH (ref 4.8–5.6)
MEAN PLASMA GLUCOSE: 234.56 mg/dL

## 2018-12-13 LAB — LIPASE, BLOOD: Lipase: 25 U/L (ref 11–51)

## 2018-12-13 LAB — CBC WITH DIFFERENTIAL/PLATELET
Abs Immature Granulocytes: 0.02 10*3/uL (ref 0.00–0.07)
Basophils Absolute: 0 10*3/uL (ref 0.0–0.1)
Basophils Relative: 0 %
Eosinophils Absolute: 0 10*3/uL (ref 0.0–0.5)
Eosinophils Relative: 0 %
HCT: 33.2 % — ABNORMAL LOW (ref 36.0–46.0)
Hemoglobin: 10.5 g/dL — ABNORMAL LOW (ref 12.0–15.0)
Immature Granulocytes: 1 %
Lymphocytes Relative: 11 %
Lymphs Abs: 0.4 10*3/uL — ABNORMAL LOW (ref 0.7–4.0)
MCH: 30.2 pg (ref 26.0–34.0)
MCHC: 31.6 g/dL (ref 30.0–36.0)
MCV: 95.4 fL (ref 80.0–100.0)
Monocytes Absolute: 0.2 10*3/uL (ref 0.1–1.0)
Monocytes Relative: 5 %
Neutro Abs: 3.3 10*3/uL (ref 1.7–7.7)
Neutrophils Relative %: 83 %
Platelets: 74 10*3/uL — ABNORMAL LOW (ref 150–400)
RBC: 3.48 MIL/uL — ABNORMAL LOW (ref 3.87–5.11)
RDW: 13.4 % (ref 11.5–15.5)
Smear Review: UNDETERMINED
WBC: 4 10*3/uL (ref 4.0–10.5)
nRBC: 0 % (ref 0.0–0.2)

## 2018-12-13 LAB — URINALYSIS, COMPLETE (UACMP) WITH MICROSCOPIC
Bacteria, UA: NONE SEEN
Bilirubin Urine: NEGATIVE
Glucose, UA: >=500 mg/dL — AB
Ketones, ur: 20 mg/dL — AB
Nitrite: NEGATIVE
Protein, ur: 30 mg/dL — AB
Specific Gravity, Urine: 1.014 (ref 1.005–1.030)
pH: 5 (ref 5.0–8.0)

## 2018-12-13 LAB — CBC
HCT: 34.4 % — ABNORMAL LOW (ref 36.0–46.0)
HEMOGLOBIN: 10.6 g/dL — AB (ref 12.0–15.0)
MCH: 29.7 pg (ref 26.0–34.0)
MCHC: 30.8 g/dL (ref 30.0–36.0)
MCV: 96.4 fL (ref 80.0–100.0)
Platelets: 70 10*3/uL — ABNORMAL LOW (ref 150–400)
RBC: 3.57 MIL/uL — ABNORMAL LOW (ref 3.87–5.11)
RDW: 13.3 % (ref 11.5–15.5)
WBC: 4.2 10*3/uL (ref 4.0–10.5)
nRBC: 0 % (ref 0.0–0.2)

## 2018-12-13 LAB — MRSA PCR SCREENING: MRSA BY PCR: NEGATIVE

## 2018-12-13 MED ORDER — ACETAMINOPHEN 325 MG PO TABS: 650.0000 mg | ORAL_TABLET | Freq: Four times a day (QID) | ORAL | Status: DC | PRN

## 2018-12-13 MED ORDER — ENOXAPARIN SODIUM 40 MG/0.4ML ~~LOC~~ SOLN: 40.0000 mg | SUBCUTANEOUS | Status: DC

## 2018-12-13 MED ORDER — SODIUM CHLORIDE 0.9 % IV SOLN
INTRAVENOUS | Status: DC
  Administered 2018-12-13: 13:00:00 via INTRAVENOUS

## 2018-12-13 MED ORDER — DEXTROSE-NACL 5-0.45 % IV SOLN
INTRAVENOUS | Status: DC
  Administered 2018-12-13: 20:00:00 via INTRAVENOUS

## 2018-12-13 MED ORDER — ENOXAPARIN SODIUM 30 MG/0.3ML ~~LOC~~ SOLN
30.0000 mg | SUBCUTANEOUS | Status: DC
  Administered 2018-12-13 – 2018-12-14 (×2): 30 mg via SUBCUTANEOUS
  Filled 2018-12-13 (×2): qty 0.3

## 2018-12-13 MED ORDER — OSELTAMIVIR PHOSPHATE 30 MG PO CAPS
30.0000 mg | ORAL_CAPSULE | Freq: Two times a day (BID) | ORAL | Status: DC
  Administered 2018-12-13: 30 mg via ORAL
  Filled 2018-12-13 (×2): qty 1

## 2018-12-13 MED ORDER — INSULIN ASPART 100 UNIT/ML ~~LOC~~ SOLN
0.0000 [IU] | Freq: Three times a day (TID) | SUBCUTANEOUS | Status: DC
  Administered 2018-12-14: 2 [IU] via SUBCUTANEOUS
  Filled 2018-12-13: qty 1

## 2018-12-13 MED ORDER — INSULIN REGULAR(HUMAN) IN NACL 100-0.9 UT/100ML-% IV SOLN
INTRAVENOUS | Status: DC
  Administered 2018-12-13 (×2): 5.4 [IU]/h via INTRAVENOUS
  Filled 2018-12-13 (×2): qty 100

## 2018-12-13 MED ORDER — INSULIN ASPART 100 UNIT/ML ~~LOC~~ SOLN: 0.0000 [IU] | Freq: Every day | SUBCUTANEOUS | Status: DC

## 2018-12-13 MED ORDER — SODIUM CHLORIDE 0.9 % IV SOLN: INTRAVENOUS | Status: AC

## 2018-12-13 MED ORDER — SODIUM CHLORIDE 0.9 % IV BOLUS
1000.0000 mL | Freq: Once | INTRAVENOUS | Status: AC
  Administered 2018-12-13: 1000 mL via INTRAVENOUS

## 2018-12-13 MED ORDER — ONDANSETRON HCL 4 MG/2ML IJ SOLN
4.0000 mg | Freq: Once | INTRAMUSCULAR | Status: AC
  Administered 2018-12-13: 4 mg via INTRAVENOUS
  Filled 2018-12-13: qty 2

## 2018-12-13 MED ORDER — ASPIRIN EC 81 MG PO TBEC
81.0000 mg | DELAYED_RELEASE_TABLET | Freq: Every day | ORAL | Status: DC
  Administered 2018-12-13 – 2018-12-15 (×3): 81 mg via ORAL
  Filled 2018-12-13 (×3): qty 1

## 2018-12-13 MED ORDER — OSELTAMIVIR PHOSPHATE 30 MG PO CAPS
30.0000 mg | ORAL_CAPSULE | Freq: Every day | ORAL | Status: DC
  Administered 2018-12-14 – 2018-12-15 (×2): 30 mg via ORAL
  Filled 2018-12-13 (×2): qty 1

## 2018-12-13 MED ORDER — INSULIN GLARGINE 100 UNIT/ML ~~LOC~~ SOLN
15.0000 [IU] | Freq: Every day | SUBCUTANEOUS | Status: DC
  Administered 2018-12-14 – 2018-12-15 (×3): 15 [IU] via SUBCUTANEOUS
  Filled 2018-12-13 (×3): qty 0.15

## 2018-12-13 MED ORDER — SODIUM CHLORIDE 0.9 % IV SOLN: INTRAVENOUS | Status: DC

## 2018-12-13 NOTE — ED Notes (Signed)
Pt to ed from home via EMS with c/o flu like symptoms.  Pt states dx with flu yesterday, reports weakness and vomiting unable to take her insulin.  Per ems CBG was over 500.  Pt alert and oriented, skin dry.  Pt reports falling at home, no obvious signs of trauma to head or face.  MD at bedside at this time. Pt reports multiple episodes of vomiting at home and a few episodes of diarrhea.

## 2018-12-13 NOTE — Progress Notes (Signed)
PHARMACY NOTE:  ANTICOAGUlANT RENAL DOSAGE ADJUSTMENT  Current anticoagulant regimen includes a mismatch between anticoagulant dosage and estimated renal function.  As per policy approved by the Pharmacy & Therapeutics and Medical Executive Committees, the anticoagulant dosage will be adjusted accordingly.  Current anticoagulant dosage:  Enoxaparin 36m SQ Q24hr.   Indication: DVT Prophylaxis   Renal Function:  Estimated Creatinine Clearance: 21.2 mL/min (A) (by C-G formula based on SCr of 2.7 mg/dL (H)). Body mass index is 20.99 kg/m.    Anticoagulant dosage has been changed to:  Enoxaparin 323mSQ Q24hr.   Additional comments: BMPs ordered Q4hr as patient has DKA. Will continue to follow renal function and make adjustments accordingly.    Thank you for allowing pharmacy to be a part of this patient's care.  Larrisa Cravey L, RPMayo Clinic Hospital [facility]2/23/2019 2:46 PM

## 2018-12-13 NOTE — Progress Notes (Signed)
Inpatient Diabetes Program Recommendations  AACE/ADA: New Consensus Statement on Inpatient Glycemic Control (2015)  Target Ranges:  Prepandial:   less than 140 mg/dL      Peak postprandial:   less than 180 mg/dL (1-2 hours)      Critically ill patients:  140 - 180 mg/dL   Results for KENNADIE, BRENNER (MRN [id number]) as of 12/13/2018 11:48  Ref. Range 12/13/2018 09:26  Sodium Latest Ref Range: 135 - 145 mmol/L 129 (L)  Potassium Latest Ref Range: 3.5 - 5.1 mmol/L 5.8 (H)  Chloride Latest Ref Range: 98 - 111 mmol/L 92 (L)  CO2 Latest Ref Range: 22 - 32 mmol/L 12 (L)  Glucose Latest Ref Range: 70 - 99 mg/dL 682 (HH)  BUN Latest Ref Range: 6 - 20 mg/dL 69 (H)  Creatinine Latest Ref Range: 0.44 - 1.00 mg/dL 2.62 (H)  Calcium Latest Ref Range: 8.9 - 10.3 mg/dL 8.6 (L)  Anion gap Latest Ref Range: 5 - 15  25 (H)    Admit: Influenza A (diagnosed at [facility] ED)/ DKA  Home DM Meds: Lantus 15 units Daily        Humalog 5 units TID  Current Orders: IV Insulin Drip (started at 11:42 am down in the ED)      Just discharged home on 12/20 after admission for DKA.  Note patient had 4 admissions this year to [facility] for various complaints.  Current A1c pending.    --Will follow patient during hospitalization--  Wyn Quaker RN, MSN, CDE Diabetes Coordinator Inpatient Glycemic Control Team Team Pager: [phone] (8a-5p)

## 2018-12-13 NOTE — ED Notes (Signed)
Resumed care from [facility]. She stated that pt has received 1 L NS bolus that was verbal order from ED MD.

## 2018-12-13 NOTE — Progress Notes (Signed)
PHARMACY NOTE:  ANTIMICROBIAL RENAL DOSAGE ADJUSTMENT  Current antimicrobial regimen includes a mismatch between antimicrobial dosage and estimated renal function.  As per policy approved by the Pharmacy & Therapeutics and Medical Executive Committees, the antimicrobial dosage will be adjusted accordingly.  Current antimicrobial dosage:  Oseltamivir 25m BID.   Indication: [facility]  Renal Function:  Estimated Creatinine Clearance: 21.2 mL/min (A) (by C-G formula based on SCr of 2.7 mg/dL (H)). []      On intermittent HD, scheduled: []      On CRRT    Antimicrobial dosage has been changed to:  Oseltamivir 370mDaily.   Additional comments: BMPs ordered Q4hr as patient has DKA. Will continue to follow renal function and make adjustments accordingly.    Thank you for allowing pharmacy to be a part of this patient's care.  Julieanna Geraci L, RPDavis Medical Center2/23/2019 2:32 PM

## 2018-12-13 NOTE — H&P (Signed)
[facility] at [place] NAME: Susan Navarro    MR#:  [id number]  DATE OF BIRTH:  Oct 21, 1961  DATE OF ADMISSION:  12/13/2018  PRIMARY CARE PHYSICIAN: Rochel Brome, MD   REQUESTING/REFERRING PHYSICIAN: Joni Fears  CHIEF COMPLAINT:   Generalized weakness and body aches HISTORY OF PRESENT ILLNESS:  Susan Navarro  is a 57 y.o. female with a known history of hypertension, diabetes mellitus came into the ED for generalized weakness associated with nausea and vomiting started 4 days ago patient also reporting she is frequently urinating and thirsty.  She went to emergency department at [facility] and diagnosed with influenza A and hyperglycemia after IV fluids her sugars were improved and patient was discharged home with Tamiflu.  Patient symptom has gotten worse since yesterday evening and unable to take her medications and came into the emergency department here the blood sugar is 682 with anion gap 25 potassium 5.8, patient started on insulin drip and hospitalist team is called to admit the patient.  Patient is feeling miserable during my examination with body aches and thirsty  PAST MEDICAL HISTORY:   Past Medical History:  Diagnosis Date  . Diabetes mellitus without complication ([place])   . Hypertension     PAST SURGICAL HISTOIRY:  History reviewed. No pertinent surgical history.  SOCIAL HISTORY:   Social History   Tobacco Use  . Smoking status: Never Smoker  . Smokeless tobacco: Never Used  Substance Use Topics  . Alcohol use: No    FAMILY HISTORY:   Family History  Problem Relation Age of Onset  . Diabetes Mother     DRUG ALLERGIES:  No Known Allergies  REVIEW OF SYSTEMS:  CONSTITUTIONAL: No fever, fatigue.  Reporting generalized weakness and body aches EYES: No blurred or double vision.  EARS, NOSE, AND THROAT: No tinnitus or ear pain.  RESPIRATORY: Intermittent episodes of cough, denies shortness of breath,  wheezing or hemoptysis.  CARDIOVASCULAR: No chest pain, orthopnea, edema.  GASTROINTESTINAL: No nausea, vomiting, diarrhea or abdominal pain.  GENITOURINARY: No dysuria, hematuria.  ENDOCRINE: Reports polydipsia, polyuria HEMATOLOGY: No anemia, easy bruising or bleeding SKIN: No rash or lesion. MUSCULOSKELETAL: No joint pain or arthritis.   NEUROLOGIC: No tingling, numbness, weakness.  PSYCHIATRY: No anxiety or depression.   MEDICATIONS AT HOME:   Prior to Admission medications   Medication Sig Start Date End Date Taking? Authorizing Provider  aspirin EC 81 MG tablet Take 81 mg by mouth daily. 02/09/17  Yes [provider]  atorvastatin (LIPITOR) 80 MG tablet Take 80 mg by mouth every evening. 11/23/18 11/23/19 Yes [provider]  Insulin Glargine (LANTUS SOLOSTAR) 100 UNIT/ML Solostar Pen Inject 15 Units into the skin daily. 11/30/18  Yes [provider]  insulin lispro (HUMALOG KWIKPEN) 100 UNIT/ML KwikPen Inject 5 Units into the skin 3 (three) times daily before meals. 11/30/18 11/30/19 Yes [provider]  metoprolol tartrate (LOPRESSOR) 25 MG tablet Take 25 mg by mouth daily. 11/23/18 11/23/19 Yes [provider]  oseltamivir (TAMIFLU) 75 MG capsule Take 1 capsule by mouth 2 (two) times daily. 12/11/18 12/16/18 Yes [provider]  acetaminophen (TYLENOL) 325 MG tablet Take 325 mg by mouth every 4 (four) hours as needed. 05/06/18 05/06/19  [provider]  azithromycin (ZITHROMAX) 500 MG tablet Take 1 tablet (500 mg total) by mouth daily for 3 days. Take 1 tablet daily for 3 days. Patient not taking: Reported on 12/13/2018 12/10/18 12/13/18  Bettey Costa, MD  VITAL SIGNS:  Blood pressure (!) 136/54, pulse 97, temperature 99.9 F (37.7 C), temperature source Oral, resp. rate 17, weight 59 kg, SpO2 100 %.  PHYSICAL EXAMINATION:  GENERAL:  57 y.o.-year-old patient lying in the bed with no acute distress.  EYES: Pupils  equal, round, reactive to light and accommodation. No scleral icterus. Extraocular muscles intact.  HEENT: Head atraumatic, normocephalic. Oropharynx and nasopharynx clear.  NECK:  Supple, no jugular venous distention. No thyroid enlargement, no tenderness.  LUNGS: Normal breath sounds bilaterally, no wheezing, rales,rhonchi or crepitation. No use of accessory muscles of respiration.  CARDIOVASCULAR: S1, S2 normal. No murmurs, rubs, or gallops.  ABDOMEN: Soft, nontender, nondistended. Bowel sounds present.  EXTREMITIES: No pedal edema, cyanosis, or clubbing.  NEUROLOGIC: Awake, alert and oriented x3 sensation intact. Gait not checked.  PSYCHIATRIC: The patient is alert and oriented x 3.  SKIN: No obvious rash, lesion, or ulcer.   LABORATORY PANEL:   CBC Recent Labs  Lab 12/13/18 0926  WBC 4.0  HGB 10.5*  HCT 33.2*  PLT 74*   ------------------------------------------------------------------------------------------------------------------  Chemistries  Recent Labs  Lab 12/13/18 0926  NA 129*  K 5.8*  CL 92*  CO2 12*  GLUCOSE 682*  BUN 69*  CREATININE 2.62*  CALCIUM 8.6*  AST 53*  ALT 143*  ALKPHOS 432*  BILITOT 1.5*   ------------------------------------------------------------------------------------------------------------------  Cardiac Enzymes No results for input(s): TROPONINI in the last 168 hours. ------------------------------------------------------------------------------------------------------------------  RADIOLOGY:  No results found.  EKG:   Orders placed or performed during the hospital encounter of 02/25/17  . ED EKG  . ED EKG  . EKG 12-Lead  . EKG 12-Lead  . EKG    IMPRESSION AND PLAN:     #DKA-with 4 admissions at [facility] this year from different etiologies Admit to intensive care unit Insulin drip per DKA protocol Aggressive hydration with IV fluids Check hemoglobin A1c Serial BMPs Potassium is at 5.8 so no potassium supplements at  this time Consult placed to diabetic coordinator  #Pseudohyponatremia from hyperglycemia With IV fluids and insulin drip which should be auto corrected continue close monitoring  #Hyperkalemia-could be from hyperglycemia Aggressive hydration with IV fluids and monitor serial BMPs We will consider [place] if no improvement  #Influenza a- Tamiflu and droplet precautions and symptomatic treatment  #AKI on chronic kidney disease Aggressive hydration with IV fluids and avoid nephrotoxins Creatinine 2.62   #Essential hypertension blood pressure is stable Currently n.p.o. hold home medications   All the records are reviewed and case discussed with ED provider. Management plans discussed with the patient, family and they are in agreement.  CODE STATUS: FC   TOTAL TIME TAKING CARE OF THIS PATIENT:  50  minutes.   Note: This dictation was prepared with Dragon dictation along with smaller phrase technology. Any transcriptional errors that result from this process are unintentional.  Nicholes Mango M.D on 12/13/2018 at 12:22 PM  Between 7am to 6pm - Pager - [phone]  After 6pm go to [email or web address] - password [email or web address] [facility]  [place] [facility]  Office  [phone]  CC: Primary care physician; Rochel Brome, MD

## 2018-12-13 NOTE — ED Triage Notes (Signed)
Pt to ed with c/o weakness and body aches.  Pt states dx with flu yesterday at [facility].  Pt also reports vomiting multiple times over the last few days.

## 2018-12-13 NOTE — Progress Notes (Signed)
Family Meeting Note  Advance Directive:yes  Today a meeting took place with the Patient.    The following clinical team members were present during this meeting:MD  The following were discussed:Patient's diagnosis: DKA, hyponatremia, hyperkalemia, acute kidney injury and chronic kidney disease, influenza A, hypertension, generalized weakness, treatment plan of care discussed in detail with the patient.  She verbalized understanding of the plan.   Patient's progosis: Unable to determine and Goals for treatment: Full Code Boyfriend Gurney Maxin is the healthcare power of attorney Additional follow-up to be provided: Intensivist, hospitalist  Time spent during discussion:17 MIN  Nicholes Mango, MD

## 2018-12-13 NOTE — Consult Note (Signed)
Patient admitted with DKA.  No formal consultation performed.  PCCM service available as needed while she is in the intensive care unit.  Merton Border, MD PCCM service Mobile [phone] Pager [phone] 12/13/2018 4:30 PM

## 2018-12-13 NOTE — ED Provider Notes (Signed)
[facility] Emergency Department Provider Note  ____________________________________________  Time seen: Approximately 11:02 AM  I have reviewed the triage vital signs and the nursing notes.   HISTORY  Chief Complaint Weakness    HPI Susan Navarro is a 57 y.o. female with a history of hypertension and diabetes who comes the ED complaining of generalized weakness nausea and vomiting that started about 4 days ago.  She went to the ED at [facility] yesterday, was diagnosed with influenza A and hyperglycemia.   Her blood sugar and symptoms improved after IV fluids and symptomatic management in the ED and she was discharged home.  However, her symptoms have continued to worsen since yesterday.  She has been unable to take her medicines.  Symptoms are constant, severe without aggravating or alleviating factors.  Denies any radiating pain.     Past Medical History:  Diagnosis Date  . Diabetes mellitus without complication ([place])   . Hypertension      Patient Active Problem List   Diagnosis Date Noted  . DKA (diabetic ketoacidoses) ([place]) 12/09/2018     History reviewed. No pertinent surgical history.   Prior to Admission medications   Medication Sig Start Date End Date Taking? Authorizing Provider  aspirin EC 81 MG tablet Take 81 mg by mouth daily. 02/09/17  Yes [provider]  atorvastatin (LIPITOR) 80 MG tablet Take 80 mg by mouth every evening. 11/23/18 11/23/19 Yes [provider]  Insulin Glargine (LANTUS SOLOSTAR) 100 UNIT/ML Solostar Pen Inject 15 Units into the skin daily. 11/30/18  Yes [provider]  insulin lispro (HUMALOG KWIKPEN) 100 UNIT/ML KwikPen Inject 5 Units into the skin 3 (three) times daily before meals. 11/30/18 11/30/19 Yes [provider]  metoprolol tartrate (LOPRESSOR) 25 MG tablet Take 25 mg by mouth daily. 11/23/18 11/23/19 Yes [provider]  oseltamivir (TAMIFLU) 75 MG capsule Take 1  capsule by mouth 2 (two) times daily. 12/11/18 12/16/18 Yes [provider]  acetaminophen (TYLENOL) 325 MG tablet Take 325 mg by mouth every 4 (four) hours as needed. 05/06/18 05/06/19  [provider]  azithromycin (ZITHROMAX) 500 MG tablet Take 1 tablet (500 mg total) by mouth daily for 3 days. Take 1 tablet daily for 3 days. Patient not taking: Reported on 12/13/2018 12/10/18 12/13/18  Bettey Costa, MD     Allergies Patient has no known allergies.   Family History  Problem Relation Age of Onset  . Diabetes Mother     Social History Social History   Tobacco Use  . Smoking status: Never Smoker  . Smokeless tobacco: Never Used  Substance Use Topics  . Alcohol use: No  . Drug use: Not on file    Review of Systems  Constitutional:   No fever or chills.  ENT:   No sore throat. No rhinorrhea. Cardiovascular:   No chest pain or syncope. Respiratory:   No dyspnea or cough. Gastrointestinal:   Negative for abdominal pain, positive vomiting, no constipation Musculoskeletal:   Negative for focal pain or swelling All other systems reviewed and are negative except as documented above in ROS and HPI.  ____________________________________________   PHYSICAL EXAM:  VITAL SIGNS: ED Triage Vitals  Enc Vitals Group     BP 12/13/18 0919 (!) 133/53     Pulse Rate 12/13/18 0919 91     Resp 12/13/18 0919 20     Temp 12/13/18 0919 99.9 F (37.7 C)     Temp Source 12/13/18 0919 Oral  SpO2 12/13/18 0919 98 %     Weight 12/13/18 0921 130 lb 1.1 oz (59 kg)     Height --      Head Circumference --      Peak Flow --      Pain Score 12/13/18 0921 0     Pain Loc --      Pain Edu? --      Excl. in [place]? --     Vital signs reviewed, nursing assessments reviewed.   Constitutional:   Alert and oriented.  Ill-appearing. Eyes:   Conjunctivae are normal. EOMI. PERRL. ENT      Head:   Normocephalic and atraumatic.      Nose:   No congestion/rhinnorhea.        Mouth/Throat:   Dry mucous membranes, no pharyngeal erythema. No peritonsillar mass.       Neck:   No meningismus. Full ROM. Hematological/Lymphatic/Immunilogical:   No cervical lymphadenopathy. Cardiovascular:   RRR. Symmetric bilateral radial and DP pulses.  No murmurs. Cap refill less than 2 seconds. Respiratory:   Normal respiratory effort without tachypnea/retractions. Breath sounds are clear and equal bilaterally. No wheezes/rales/rhonchi. Gastrointestinal:   Soft and nontender. Non distended. There is no CVA tenderness.  No rebound, rigidity, or guarding. Musculoskeletal:   Normal range of motion in all extremities. No joint effusions.  No lower extremity tenderness.  No edema. Neurologic:   Normal speech and language.  Motor grossly intact. No acute focal neurologic deficits are appreciated.  Skin:    Skin is warm, dry and intact. No rash noted.  No petechiae, purpura, or bullae.  ____________________________________________    LABS (pertinent positives/negatives) (all labs ordered are listed, but only abnormal results are displayed) Labs Reviewed  COMPREHENSIVE METABOLIC PANEL - Abnormal; Notable for the following components:      Result Value   Sodium 129 (*)    Potassium 5.8 (*)    Chloride 92 (*)    CO2 12 (*)    Glucose, Bld 682 (*)    BUN 69 (*)    Creatinine, Ser 2.62 (*)    Calcium 8.6 (*)    Total Protein 6.4 (*)    AST 53 (*)    ALT 143 (*)    Alkaline Phosphatase 432 (*)    Total Bilirubin 1.5 (*)    GFR calc non Af Amer 19 (*)    GFR calc Af Amer 22 (*)    Anion gap 25 (*)    All other components within normal limits  CBC WITH DIFFERENTIAL/PLATELET - Abnormal; Notable for the following components:   RBC 3.48 (*)    Hemoglobin 10.5 (*)    HCT 33.2 (*)    Platelets 74 (*)    Lymphs Abs 0.4 (*)    All other components within normal limits  URINE CULTURE  LIPASE, BLOOD  URINALYSIS, COMPLETE (UACMP) WITH MICROSCOPIC    ____________________________________________   EKG    ____________________________________________    RADIOLOGY  No results found.  ____________________________________________   PROCEDURES .Critical Care Performed by: Carrie Mew, MD Authorized by: Carrie Mew, MD   Critical care provider statement:    Critical care time (minutes):  35   Critical care time was exclusive of:  Separately billable procedures and treating other patients   Critical care was necessary to treat or prevent imminent or life-threatening deterioration of the following conditions:  Metabolic crisis and dehydration   Critical care was time spent personally by me on the following activities:  Development  of treatment plan with patient or surrogate, discussions with consultants, evaluation of patient's response to treatment, examination of patient, obtaining history from patient or surrogate, ordering and performing treatments and interventions, ordering and review of laboratory studies, ordering and review of radiographic studies, pulse oximetry, re-evaluation of patient's condition and review of old charts    ____________________________________________   CLINICAL IMPRESSION / [place] / ED COURSE  Pertinent labs & imaging results that were available during my care of the patient were reviewed by me and considered in my medical decision making (see chart for details).    Patient presents with persistent vomiting, clearly dehydrated and hyperglycemic.  Concern for DKA.Considering the patient's symptoms, medical history, and physical examination today, I have low suspicion for cholecystitis or biliary pathology, pancreatitis, perforation or bowel obstruction, hernia, intra-abdominal abscess, AAA or dissection, volvulus or intussusception, mesenteric ischemia, or appendicitis.  Check labs, IV fluid bolus, IV Zofran.  ----------------------------------------- 11:08 AM on  12/13/2018 -----------------------------------------  Labs show metabolic acidosis high anion gap, glucose of almost 700 consistent with DKA.  Discussed with the hospitalist for admission.  Also give further IV fluid bolus and start an insulin drip.  Potassium is 5.8, no additional potassium supplementation needed.  I think her electrolyte abnormalities of sodium 129 potassium 5.8 will improve with IV fluids and insulin administration.      ____________________________________________   FINAL CLINICAL IMPRESSION(S) / ED DIAGNOSES    Final diagnoses:  Diabetic ketoacidosis without coma associated with type 1 diabetes mellitus (HCC)  Influenza A  Nausea and vomiting, intractability of vomiting not specified, unspecified vomiting type     ED Discharge Orders    None      Portions of this note were generated with dragon dictation software. Dictation errors may occur despite best attempts at proofreading.   Carrie Mew, MD 12/13/18 [phone]

## 2018-12-13 NOTE — ED Notes (Signed)
Admitting at bedside 

## 2018-12-13 NOTE — Care Management Note (Signed)
Case Management Note  Patient Details  Name: Susan Navarro MRN: [id number] Date of Birth: Jan 30, 1961  Subjective/Objective: Patient admitted with hyperglycemia and influenza.  Patient was discharged on 12/20.  Sister Shauna Hugh was at the bedside and reports that the patient went to the ER over the weekend at [facility] and was discharge home with tamaflu.  Diane reports that the flu has been going around in her family the patient's twin sister Levada Dy has just had the flu as well.  Patient is currently sleeping.  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 Georgina Snell with [facility] aware of patient admission.  RNCM will follow for discharge needs. Doran Clay RN BSN [phone]               Action/Plan:   Expected Discharge Date:                  Expected Discharge Plan:     In-House Referral:     Discharge planning Services     Post Acute Care Choice:    Choice offered to:     DME Arranged:    DME Agency:     HH Arranged:    HH Agency:     Status of Service:     If discussed at [place] of Stay Meetings, dates discussed:    Additional Comments:  Shelbie Hutching, RN 12/13/2018, 2:33 PM

## 2018-12-14 LAB — BASIC METABOLIC PANEL
Anion gap: 6 (ref 5–15)
Anion gap: 8 (ref 5–15)
BUN: 66 mg/dL — ABNORMAL HIGH (ref 6–20)
BUN: 68 mg/dL — ABNORMAL HIGH (ref 6–20)
CO2: 23 mmol/L (ref 22–32)
CO2: 23 mmol/L (ref 22–32)
Calcium: 7.7 mg/dL — ABNORMAL LOW (ref 8.9–10.3)
Calcium: 8.2 mg/dL — ABNORMAL LOW (ref 8.9–10.3)
Chloride: 107 mmol/L (ref 98–111)
Chloride: 108 mmol/L (ref 98–111)
Creatinine, Ser: 2.4 mg/dL — ABNORMAL HIGH (ref 0.44–1.00)
Creatinine, Ser: 2.46 mg/dL — ABNORMAL HIGH (ref 0.44–1.00)
GFR calc Af Amer: 24 mL/min — ABNORMAL LOW (ref >60)
GFR calc Af Amer: 25 mL/min — ABNORMAL LOW (ref >60)
GFR calc non Af Amer: 21 mL/min — ABNORMAL LOW (ref >60)
GFR calc non Af Amer: 22 mL/min — ABNORMAL LOW (ref >60)
Glucose, Bld: 124 mg/dL — ABNORMAL HIGH (ref 70–99)
Glucose, Bld: 78 mg/dL (ref 70–99)
Potassium: 3.6 mmol/L (ref 3.5–5.1)
Potassium: 4.1 mmol/L (ref 3.5–5.1)
Sodium: 137 mmol/L (ref 135–145)
Sodium: 138 mmol/L (ref 135–145)

## 2018-12-14 LAB — URINE CULTURE

## 2018-12-14 LAB — GLUCOSE, CAPILLARY
GLUCOSE-CAPILLARY: 108 mg/dL — AB (ref 70–99)
GLUCOSE-CAPILLARY: 78 mg/dL (ref 70–99)
Glucose-Capillary: 118 mg/dL — ABNORMAL HIGH (ref 70–99)
Glucose-Capillary: 120 mg/dL — ABNORMAL HIGH (ref 70–99)
Glucose-Capillary: 152 mg/dL — ABNORMAL HIGH (ref 70–99)
Glucose-Capillary: 157 mg/dL — ABNORMAL HIGH (ref 70–99)
Glucose-Capillary: 55 mg/dL — ABNORMAL LOW (ref 70–99)
Glucose-Capillary: 69 mg/dL — ABNORMAL LOW (ref 70–99)
Glucose-Capillary: 88 mg/dL (ref 70–99)

## 2018-12-14 LAB — CBC
HCT: 31.1 % — ABNORMAL LOW (ref 36.0–46.0)
HEMOGLOBIN: 10.3 g/dL — AB (ref 12.0–15.0)
MCH: 30.3 pg (ref 26.0–34.0)
MCHC: 33.1 g/dL (ref 30.0–36.0)
MCV: 91.5 fL (ref 80.0–100.0)
Platelets: 82 10*3/uL — ABNORMAL LOW (ref 150–400)
RBC: 3.4 MIL/uL — ABNORMAL LOW (ref 3.87–5.11)
RDW: 13.5 % (ref 11.5–15.5)
WBC: 3.9 10*3/uL — ABNORMAL LOW (ref 4.0–10.5)
nRBC: 0 % (ref 0.0–0.2)

## 2018-12-14 MED ORDER — SODIUM CHLORIDE 0.9 % IV SOLN
INTRAVENOUS | Status: DC
  Administered 2018-12-14 (×2): via INTRAVENOUS

## 2018-12-14 MED ORDER — METOPROLOL SUCCINATE ER 25 MG PO TB24
25.0000 mg | ORAL_TABLET | Freq: Every day | ORAL | Status: DC
  Administered 2018-12-14 – 2018-12-15 (×2): 25 mg via ORAL
  Filled 2018-12-14 (×2): qty 1

## 2018-12-14 MED ORDER — ATORVASTATIN CALCIUM 20 MG PO TABS
80.0000 mg | ORAL_TABLET | Freq: Every day | ORAL | Status: DC
  Administered 2018-12-14: 80 mg via ORAL
  Filled 2018-12-14: qty 4

## 2018-12-14 MED ORDER — INSULIN ASPART 100 UNIT/ML ~~LOC~~ SOLN: 2.0000 [IU] | Freq: Three times a day (TID) | SUBCUTANEOUS | Status: DC

## 2018-12-14 NOTE — Progress Notes (Signed)
[place] [facility] [place] NAME: Susan Navarro    MR#:  [id number]  DATE OF BIRTH:  Aug 15, 1961  SUBJECTIVE:  Came in with cough and positive flu with vomiting and DKA--recurrent  REVIEW OF SYSTEMS:   Review of Systems  Constitutional: Negative for chills, fever and weight loss.  HENT: Negative for ear discharge, ear pain and nosebleeds.   Eyes: Negative for blurred vision, pain and discharge.  Respiratory: Positive for cough. Negative for sputum production, shortness of breath, wheezing and stridor.   Cardiovascular: Negative for chest pain, palpitations, orthopnea and PND.  Gastrointestinal: Negative for abdominal pain, diarrhea, nausea and vomiting.  Genitourinary: Negative for frequency and urgency.  Musculoskeletal: Negative for back pain and joint pain.  Neurological: Negative for sensory change, speech change, focal weakness and weakness.  Psychiatric/Behavioral: Negative for depression and hallucinations. The patient is not nervous/anxious.    Tolerating Diet:yes Tolerating PT:   DRUG ALLERGIES:  No Known Allergies  VITALS:  Blood pressure (!) 142/83, pulse 75, temperature 98.5 F (36.9 C), temperature source Oral, resp. rate 18, height 5' 6"  (1.676 m), weight 60.8 kg, SpO2 100 %.  PHYSICAL EXAMINATION:   Physical Exam  GENERAL:  57 y.o.-year-old patient lying in the bed with no acute distress. Thin cachectic EYES: Pupils equal, round, reactive to light and accommodation. No scleral icterus. Extraocular muscles intact.  HEENT: Head atraumatic, normocephalic. Oropharynx and nasopharynx clear.  NECK:  Supple, no jugular venous distention. No thyroid enlargement, no tenderness.  LUNGS: Normal breath sounds bilaterally, no wheezing, rales, rhonchi. No use of accessory muscles of respiration.  CARDIOVASCULAR: S1, S2 normal. No murmurs, rubs, or gallops.  ABDOMEN: Soft, nontender, nondistended. Bowel sounds present. No  organomegaly or mass.  EXTREMITIES: No cyanosis, clubbing or edema b/l.    NEUROLOGIC: Cranial nerves II through XII are intact. No focal Motor or sensory deficits b/l.   PSYCHIATRIC:  patient is alert and oriented x 3.  SKIN: No obvious rash, lesion, or ulcer.   LABORATORY PANEL:  CBC Recent Labs  Lab 12/14/18 0427  WBC 3.9*  HGB 10.3*  HCT 31.1*  PLT 82*    Chemistries  Recent Labs  Lab 12/13/18 0926  12/14/18 0427  NA 129*   < > 138  K 5.8*   < > 4.1  CL 92*   < > 107  CO2 12*   < > 23  GLUCOSE 682*   < > 124*  BUN 69*   < > 66*  CREATININE 2.62*   < > 2.46*  CALCIUM 8.6*   < > 8.2*  AST 53*  --   --   ALT 143*  --   --   ALKPHOS 432*  --   --   BILITOT 1.5*  --   --    < > = values in this interval not displayed.   Cardiac Enzymes No results for input(s): TROPONINI in the last 168 hours. RADIOLOGY:  No results found. ASSESSMENT AND PLAN:  Susan Navarro  is a 57 y.o. female with a known history of hypertension, diabetes mellitus came into the ED for generalized weakness associated with nausea and vomiting started 4 days ago patient also reporting she is frequently urinating and thirsty.  She went to emergency department at [facility] and diagnosed with influenza A and hyperglycemia after IV fluids her sugars were improved and patient was discharged home with Tamiflu.  #DKA-with 4 admissions at [facility] this year from different etiologies  Received Insulin drip per DKA protocol--now on lantus Aggressive hydration with IV fluids Serial BMPs showed improvement Potassium is at 5.8 so no potassium supplements at this time  Consult placed to diabetic coordinator  #Pseudohyponatremia from hyperglycemia With IV fluids and insulin drip which should be auto corrected continue close monitoring  #Hyperkalemia-could be from hyperglycemia Aggressive hydration with IV fluids and monitor serial BMPs We will consider V[place] if no improvement  #Influenza a- Tamiflu and  droplet precautions and symptomatic treatment  #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 hold home medications  Case discussed with Care Management/Social Worker. Management plans discussed with the patient, family and they are in agreement.  CODE STATUS: full  DVT Prophylaxis: heparin  TOTAL TIME TAKING CARE OF THIS PATIENT: *30* minutes.  >50% time spent on counselling and coordination of care  POSSIBLE D/C IN 1-2 DAYS, DEPENDING ON CLINICAL CONDITION.  Note: This dictation was prepared with Dragon dictation along with smaller phrase technology. Any transcriptional errors that result from this process are unintentional.  Fritzi Mandes M.D on 12/14/2018 at 9:23 PM  Between 7am to 6pm - Pager - [phone]  After 6pm go to [email or web address] - password [email or web address] [facility]  Office  [phone]  CC: Primary care physician; Rochel Brome, MDPatient ID: Susan Navarro, female   DOB: Dec 30, 1960, 57 y.o.   MRN: [id number]

## 2018-12-14 NOTE — Significant Event (Addendum)
Hypoglycemic Event  CBG: 61  Treatment: 4 oz juice/soda  Symptoms: None  Follow-up CBG: Time:0002 CBG Result:69  Possible Reasons for Event: Medication regimen: On Insulin gtt for DKA  Comments/MD notified:Jeremiah Dewaine Conger, NP notified. 1/2 of juice given and BG rechecked. After recheck, other 1/2 of juice given andfollow up BG check was 120    Dena Billet, RN

## 2018-12-14 NOTE — Progress Notes (Signed)
When patient had hypoglycemic event, insulin gtt was turned off per Darel Hong, NP. Scheduled lantus given. Insulin gtt order d/ced. Will now check BGs ACHS.

## 2018-12-14 NOTE — Progress Notes (Signed)
Inpatient Diabetes Program Recommendations  AACE/ADA: New Consensus Statement on Inpatient Glycemic Control (2019)  Target Ranges:  Prepandial:   less than 140 mg/dL      Peak postprandial:   less than 180 mg/dL (1-2 hours)      Critically ill patients:  140 - 180 mg/dL  Results for OLIVE, ZMUDA (MRN [id number]) as of 12/14/2018 07:28  Ref. Range 12/14/2018 00:02 12/14/2018 00:35 12/14/2018 01:31 12/14/2018 03:57  Glucose-Capillary Latest Ref Range: 70 - 99 mg/dL 69 (L) 120 (H) 152 (H)  Lantus 15 units @ 1:07 108 (H)  Results for NATALEY, BAHRI (MRN [id number]) as of 12/14/2018 07:28  Ref. Range 12/13/2018 09:26  Glucose Latest Ref Range: 70 - 99 mg/dL [facility])   Results for KHALIA, GONG (MRN [id number]) as of 12/14/2018 07:28  Ref. Range 12/13/2018 12:42  Hemoglobin A1C Latest Ref Range: 4.8 - 5.6 % 9.8 (H)   Review of Glycemic Control  Diabetes history: DM1 (makes no insulin; requires basal, correction, and meal coverage insulin) Outpatient Diabetes medications: Lantus 15 units QHS, Humalog 5 units TID Current orders for Inpatient glycemic control: Lantus 15 units QHS, Novolof 0-9 units TID with meals, Novolog 0-5 units QHS  Inpatient Diabetes Program Recommendations:  Insulin - Meal Coverage: Please consider ordering Novolog 2 units TID with meals for meal coverage if patient eats at least 50% of meals. HbgA1C: A1C 9.8% on 12/13/18 indicating an average glucose of 235 mg/dl over the past 2-3 months. Per Care Everywhere, prior A1C was 12.2% on 10/11/18. Therefore, current A1C improved but still not at goal of 7% or less.  NOTE: Noted consult for Diabetes Coordinator. Diabetes Coordinator is not on campus but is available by pager from 8am to 5pm for questions or concerns. Noted patient was recently inpatient and discharged on 12/10/18. Patient admitted this admission with DKA and influenza. Patient was initially on an insulin drip and was transitioned to SQ insulin last night. Since  patient has Type 1 DM, she will require basal, correction, and meal coverage insulin. Will continue to follow while inpatient.  Thanks, Barnie Alderman, RN, MSN, CDE Diabetes Coordinator Inpatient Diabetes Program [phone] (Team Pager from 8am to 5pm)

## 2018-12-14 NOTE — Progress Notes (Signed)
Report given to [name] for patient to be transferred to room 260, patient let twin sister know of transfer. Patient transferred on room air and 1 back pack and 1 personal belongings bag.

## 2018-12-14 NOTE — Plan of Care (Signed)
  Problem: Health Behavior/Discharge Planning: Goal: Ability to manage health-related needs will improve Outcome: Progressing   Problem: Clinical Measurements: Goal: Ability to maintain clinical measurements within normal limits will improve Outcome: Progressing Goal: Will remain free from infection Outcome: Progressing Goal: Diagnostic test results will improve Outcome: Progressing Goal: Respiratory complications will improve Outcome: Progressing Goal: Cardiovascular complication will be avoided Outcome: Progressing   Problem: Elimination: Goal: Will not experience complications related to urinary retention Outcome: Progressing   Problem: Safety: Goal: Ability to remain free from injury will improve Outcome: Progressing

## 2018-12-15 LAB — BASIC METABOLIC PANEL
Anion gap: 11 (ref 5–15)
BUN: 50 mg/dL — ABNORMAL HIGH (ref 6–20)
CO2: 25 mmol/L (ref 22–32)
Calcium: 8.6 mg/dL — ABNORMAL LOW (ref 8.9–10.3)
Chloride: 105 mmol/L (ref 98–111)
Creatinine, Ser: 1.75 mg/dL — ABNORMAL HIGH (ref 0.44–1.00)
GFR calc Af Amer: 37 mL/min — ABNORMAL LOW (ref >60)
GFR calc non Af Amer: 32 mL/min — ABNORMAL LOW (ref >60)
Glucose, Bld: 56 mg/dL — ABNORMAL LOW (ref 70–99)
Potassium: 3.5 mmol/L (ref 3.5–5.1)
Sodium: 141 mmol/L (ref 135–145)

## 2018-12-15 LAB — GLUCOSE, CAPILLARY
Glucose-Capillary: 46 mg/dL — ABNORMAL LOW (ref 70–99)
Glucose-Capillary: 71 mg/dL (ref 70–99)
Glucose-Capillary: 132 mg/dL — ABNORMAL HIGH (ref 70–99)

## 2018-12-15 MED ORDER — OSELTAMIVIR PHOSPHATE 30 MG PO CAPS: 30.0000 mg | ORAL_CAPSULE | Freq: Every day | ORAL | 0 refills | Status: AC

## 2018-12-15 NOTE — Care Management (Addendum)
Susan Navarro with [facility] notified by email that patient is being discharged to home today. Patient confirmed. She states that she does not have ride to home and feels that her mother is out and about and too busy.  I spoke with patient's mother by phone and she does not drive but will arrange for someone to provide transportation to home today. RN updated.

## 2018-12-15 NOTE — Discharge Instructions (Signed)
Keep log of your sugars at home

## 2018-12-15 NOTE — Plan of Care (Signed)
Patient's blood sugar level has improved. Educate patient about sick day.

## 2018-12-15 NOTE — Care Management (Signed)
Patient's ride is here at visitor entrance. Notified Unit Secretary- she will notify RN.

## 2018-12-15 NOTE — Discharge Summary (Signed)
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 Diabetes mellitus without complication ([place])   . Hypertension     HOSPITAL COURSE:  VangelaWhiteis a57 y.o.femalewith a known history of hypertension, diabetes mellitus came into the ED for generalized weakness associated with nausea and vomiting started 4 days ago patient also reporting she is frequently urinating and thirsty. She went to emergency department at [facility] and diagnosed with influenza A and hyperglycemia after IV fluids her sugars were improved and patient was discharged home with Tamiflu.  #DKA-with 4 admissions at [facility] this year from different etiologies Received Insulin drip per DKA protocol--now on lantus Received Aggressive hydration with IV fluids Serial BMPs showed improvement Sugars better. Cont current home lantus regimen  #Pseudohyponatremia from hyperglycemia With IV fluids and insulin drip which should be auto corrected continue close monitoring  #Hyperkalemia-could be from hyperglycemia Aggressive hydration with IV fluids  -corrected  #Influenza a-Tamiflu and droplet precautions and symptomatic treatment  #AKI on chronic kidney disease III/IV Aggressive hydration with IV fluids and avoid nephrotoxins Creatinine 2.62 --175 (near  baseline)  #Essential hypertension blood pressure is stable resumed at discharge  D/c home with out pt PCP f/u in 1 weeks Pt agreeable   CONSULTS OBTAINED:    DRUG ALLERGIES:  No Known Allergies  DISCHARGE MEDICATIONS:   Allergies as of 12/15/2018   No Known Allergies     Medication List    STOP taking these medications   azithromycin 500 MG tablet Commonly known as:  ZITHROMAX     TAKE these medications   acetaminophen 325 MG tablet Commonly known as:  TYLENOL Take 325 mg by mouth every 4 (four) hours as needed.   aspirin EC 81 MG tablet Take 81 mg by mouth daily.   atorvastatin 80 MG tablet Commonly known as:  LIPITOR Take 80 mg by mouth every evening.   HUMALOG KWIKPEN 100 UNIT/ML KwikPen Generic drug:  insulin lispro Inject 5 Units into the skin 3 (three) times daily before meals.   LANTUS SOLOSTAR 100 UNIT/ML Solostar Pen Generic drug:  Insulin Glargine Inject 15 Units into the skin daily.   metoprolol tartrate 25 MG tablet Commonly known as:  LOPRESSOR Take 25 mg by mouth daily.   oseltamivir 30 MG capsule Commonly known as:  TAMIFLU Take 1 capsule (30 mg total) by mouth daily for 3 days. What changed:    medication strength  how much to take  when to take this       If you experience worsening of your admission symptoms, develop shortness of breath, life threatening emergency, suicidal or homicidal thoughts you must seek medical attention immediately by calling 911 or calling your MD immediately  if symptoms less severe.  You Must read complete instructions/literature along with all the possible adverse reactions/side effects for all the Medicines you take and that have been prescribed to you. Take any new Medicines after you  have completely understood and accept all the possible adverse reactions/side effects.   Please note  You were cared for by a hospitalist during your hospital stay. If you have any questions about your discharge  medications or the care you received while you were in the hospital after you are discharged, you can call the unit and asked to speak with the hospitalist on call if the hospitalist that took care of you is not available. Once you are discharged, your primary care physician will handle any further medical issues. Please note that NO REFILLS for any discharge medications will be authorized once you are discharged, as it is imperative that you return to your primary care physician (or establish a relationship with a primary care physician if you do not have one) for your aftercare needs so that they can reassess your need for medications and monitor your lab values. Today   SUBJECTIVE   Feels a lot better  VITAL SIGNS:  Blood pressure (!) 150/80, pulse 77, temperature 98.3 F (36.8 C), temperature source Oral, resp. rate 18, height 5' 6"  (1.676 m), weight 62.6 kg, SpO2 100 %.  I/O:    Intake/Output Summary (Last 24 hours) at 12/15/2018 1055 Last data filed at 12/15/2018 0649 Gross per 24 hour  Intake 886.35 ml  Output 1750 ml  Net -863.65 ml    PHYSICAL EXAMINATION:  GENERAL:  57 y.o.-year-old patient lying in the bed with no acute distress.  EYES: Pupils equal, round, reactive to light and accommodation. No scleral icterus. Extraocular muscles intact.  HEENT: Head atraumatic, normocephalic. Oropharynx and nasopharynx clear.  NECK:  Supple, no jugular venous distention. No thyroid enlargement, no tenderness.  LUNGS: Normal breath sounds bilaterally, no wheezing, rales,rhonchi or crepitation. No use of accessory muscles of respiration.  CARDIOVASCULAR: S1, S2 normal. No murmurs, rubs, or gallops.  ABDOMEN: Soft, non-tender, non-distended. Bowel sounds present. No organomegaly or mass.  EXTREMITIES: No pedal edema, cyanosis, or clubbing.  NEUROLOGIC: Cranial nerves II through XII are intact. Muscle strength 5/5 in all extremities. Sensation intact. Gait not checked.  PSYCHIATRIC: The  patient is alert and oriented x 3.  SKIN: No obvious rash, lesion, or ulcer.   DATA REVIEW:   CBC  Recent Labs  Lab 12/14/18 0427  WBC 3.9*  HGB 10.3*  HCT 31.1*  PLT 82*    Chemistries  Recent Labs  Lab 12/13/18 0926  12/15/18 0550  NA 129*   < > 141  K 5.8*   < > 3.5  CL 92*   < > 105  CO2 12*   < > 25  GLUCOSE 682*   < > 56*  BUN 69*   < > 50*  CREATININE 2.62*   < > 1.75*  CALCIUM 8.6*   < > 8.6*  AST 53*  --   --   ALT 143*  --   --   ALKPHOS 432*  --   --   BILITOT 1.5*  --   --    < > = values in this interval not displayed.    Microbiology Results   Recent Results (from the past 240 hour(s))  MRSA PCR Screening     Status: None   Collection Time: 12/09/18  2:45 PM  Result Value Ref Range Status   MRSA by PCR NEGATIVE NEGATIVE Final    Comment:        The GeneXpert MRSA Assay (FDA approved for NASAL specimens only), is one component of a comprehensive MRSA colonization surveillance  program. It is not intended to diagnose MRSA infection nor to guide or monitor treatment for MRSA infections. Performed at [facility], [address] [place], [place] [address]   Urine culture     Status: None   Collection Time: 12/13/18 11:43 AM  Result Value Ref Range Status   Specimen Description   Final    URINE, RANDOM Performed at [facility], [address], [place], [place] [address]    Special Requests   Final    NONE Performed at [facility], [address] [place], [place] [address]    Culture   Final    Multiple bacterial morphotypes present, none predominant. Suggest appropriate recollection if clinically indicated.   Report Status 12/14/2018 FINAL  Final  MRSA PCR Screening     Status: None   Collection Time: 12/13/18 12:46 PM  Result Value Ref Range Status   MRSA by PCR NEGATIVE NEGATIVE Final    Comment:        The GeneXpert MRSA Assay (FDA approved for NASAL specimens only), is one component of  a comprehensive MRSA colonization surveillance program. It is not intended to diagnose MRSA infection nor to guide or monitor treatment for MRSA infections. Performed at [facility], [address] [place], [place] [address]     RADIOLOGY:  No results found.   Management plans discussed with the patient, family and they are in agreement.  CODE STATUS:     Code Status Orders  (From admission, onward)         Start     Ordered   12/13/18 1214  Full code  Continuous     12/13/18 1213        Code Status History    Date Active Date Inactive Code Status Order ID Comments User Context   12/09/2018 1344 12/10/2018 1935 Full Code [id number]  Dustin Flock, MD ED      TOTAL TIME TAKING CARE OF THIS PATIENT: *40* minutes.    Fritzi Mandes M.D on 12/15/2018 at 10:55 AM  Between 7am to 6pm - Pager - [phone] After 6pm go to [email or web address] - password [email or web address] [facility]  Office  [phone]  CC: Primary care physician; Susan Brome, MD

## 2018-12-16 LAB — GLUCOSE, CAPILLARY: Glucose-Capillary: 84 mg/dL (ref 70–99)

## 2019-04-14 ENCOUNTER — Other Ambulatory Visit (HOSPITAL_COMMUNITY): Payer: Self-pay

## 2019-04-14 ENCOUNTER — Other Ambulatory Visit: Payer: Self-pay

## 2019-04-14 ENCOUNTER — Emergency Department: Payer: Medicare HMO

## 2019-04-14 ENCOUNTER — Inpatient Hospital Stay
Admission: EM | Admit: 2019-04-14 | Discharge: 2019-04-16 | DRG: 638 | Disposition: A | Discharge status: Home / Self Care | Payer: Medicare HMO | Attending: Internal Medicine | Admitting: Internal Medicine

## 2019-04-14 DIAGNOSIS — E785 Hyperlipidemia, unspecified: Secondary | ICD-10-CM | POA: Diagnosis present

## 2019-04-14 DIAGNOSIS — B952 Enterococcus as the cause of diseases classified elsewhere: Secondary | ICD-10-CM | POA: Diagnosis present

## 2019-04-14 DIAGNOSIS — Z833 Family history of diabetes mellitus: Secondary | ICD-10-CM | POA: Diagnosis not present

## 2019-04-14 DIAGNOSIS — Z20828 Contact with and (suspected) exposure to other viral communicable diseases: Secondary | ICD-10-CM | POA: Diagnosis present

## 2019-04-14 DIAGNOSIS — Z794 Long term (current) use of insulin: Secondary | ICD-10-CM

## 2019-04-14 DIAGNOSIS — E860 Dehydration: Secondary | ICD-10-CM | POA: Diagnosis present

## 2019-04-14 DIAGNOSIS — D696 Thrombocytopenia, unspecified: Secondary | ICD-10-CM | POA: Diagnosis present

## 2019-04-14 DIAGNOSIS — E1110 Type 2 diabetes mellitus with ketoacidosis without coma: Secondary | ICD-10-CM | POA: Diagnosis present

## 2019-04-14 DIAGNOSIS — Z7982 Long term (current) use of aspirin: Secondary | ICD-10-CM | POA: Diagnosis not present

## 2019-04-14 DIAGNOSIS — E1010 Type 1 diabetes mellitus with ketoacidosis without coma: Secondary | ICD-10-CM | POA: Diagnosis present

## 2019-04-14 DIAGNOSIS — I69334 Monoplegia of upper limb following cerebral infarction affecting left non-dominant side: Secondary | ICD-10-CM

## 2019-04-14 DIAGNOSIS — E1022 Type 1 diabetes mellitus with diabetic chronic kidney disease: Secondary | ICD-10-CM | POA: Diagnosis present

## 2019-04-14 DIAGNOSIS — Z6824 Body mass index (BMI) 24.0-24.9, adult: Secondary | ICD-10-CM

## 2019-04-14 DIAGNOSIS — R64 Cachexia: Secondary | ICD-10-CM | POA: Diagnosis present

## 2019-04-14 DIAGNOSIS — E871 Hypo-osmolality and hyponatremia: Secondary | ICD-10-CM | POA: Diagnosis present

## 2019-04-14 DIAGNOSIS — N390 Urinary tract infection, site not specified: Secondary | ICD-10-CM | POA: Diagnosis present

## 2019-04-14 DIAGNOSIS — N179 Acute kidney failure, unspecified: Secondary | ICD-10-CM | POA: Diagnosis present

## 2019-04-14 DIAGNOSIS — I129 Hypertensive chronic kidney disease with stage 1 through stage 4 chronic kidney disease, or unspecified chronic kidney disease: Secondary | ICD-10-CM | POA: Diagnosis present

## 2019-04-14 DIAGNOSIS — E875 Hyperkalemia: Secondary | ICD-10-CM | POA: Diagnosis present

## 2019-04-14 DIAGNOSIS — N183 Chronic kidney disease, stage 3 (moderate): Secondary | ICD-10-CM | POA: Diagnosis present

## 2019-04-14 DIAGNOSIS — D631 Anemia in chronic kidney disease: Secondary | ICD-10-CM | POA: Diagnosis present

## 2019-04-14 DIAGNOSIS — R739 Hyperglycemia, unspecified: Secondary | ICD-10-CM

## 2019-04-14 HISTORY — DX: Anemia, unspecified: D64.9

## 2019-04-14 HISTORY — DX: Cerebral infarction, unspecified: I63.9

## 2019-04-14 HISTORY — DX: Chronic kidney disease, unspecified: N18.9

## 2019-04-14 LAB — URINE DRUG SCREEN, QUALITATIVE (ARMC ONLY)
Amphetamines, Ur Screen: NOT DETECTED
Barbiturates, Ur Screen: NOT DETECTED
Benzodiazepine, Ur Scrn: NOT DETECTED
Cannabinoid 50 Ng, Ur ~~LOC~~: NOT DETECTED
Cocaine Metabolite,Ur ~~LOC~~: NOT DETECTED
MDMA (Ecstasy)Ur Screen: NOT DETECTED
Methadone Scn, Ur: NOT DETECTED
Opiate, Ur Screen: NOT DETECTED
Phencyclidine (PCP) Ur S: NOT DETECTED
Tricyclic, Ur Screen: NOT DETECTED

## 2019-04-14 LAB — GLUCOSE, CAPILLARY
Glucose-Capillary: >600 mg/dL (ref 70–99)
Glucose-Capillary: >600 mg/dL (ref 70–99)
Glucose-Capillary: 584 mg/dL (ref 70–99)
Glucose-Capillary: 468 mg/dL — ABNORMAL HIGH (ref 70–99)
Glucose-Capillary: 473 mg/dL — ABNORMAL HIGH (ref 70–99)
Glucose-Capillary: 458 mg/dL — ABNORMAL HIGH (ref 70–99)
Glucose-Capillary: 414 mg/dL — ABNORMAL HIGH (ref 70–99)
Glucose-Capillary: 394 mg/dL — ABNORMAL HIGH (ref 70–99)
Glucose-Capillary: 254 mg/dL — ABNORMAL HIGH (ref 70–99)
Glucose-Capillary: 172 mg/dL — ABNORMAL HIGH (ref 70–99)
Glucose-Capillary: 118 mg/dL — ABNORMAL HIGH (ref 70–99)
Glucose-Capillary: 74 mg/dL (ref 70–99)
Glucose-Capillary: 55 mg/dL — ABNORMAL LOW (ref 70–99)
Glucose-Capillary: 109 mg/dL — ABNORMAL HIGH (ref 70–99)
Glucose-Capillary: 270 mg/dL — ABNORMAL HIGH (ref 70–99)
Glucose-Capillary: 305 mg/dL — ABNORMAL HIGH (ref 70–99)

## 2019-04-14 LAB — BASIC METABOLIC PANEL
Anion gap: 16 — ABNORMAL HIGH (ref 5–15)
Anion gap: 16 — ABNORMAL HIGH (ref 5–15)
Anion gap: 9 (ref 5–15)
BUN: 65 mg/dL — ABNORMAL HIGH (ref 6–20)
BUN: 65 mg/dL — ABNORMAL HIGH (ref 6–20)
BUN: 60 mg/dL — ABNORMAL HIGH (ref 6–20)
CO2: 21 mmol/L — ABNORMAL LOW (ref 22–32)
CO2: 23 mmol/L (ref 22–32)
CO2: 26 mmol/L (ref 22–32)
Calcium: 8.8 mg/dL — ABNORMAL LOW (ref 8.9–10.3)
Calcium: 9.5 mg/dL (ref 8.9–10.3)
Calcium: 9.4 mg/dL (ref 8.9–10.3)
Chloride: 97 mmol/L — ABNORMAL LOW (ref 98–111)
Chloride: 98 mmol/L (ref 98–111)
Chloride: 107 mmol/L (ref 98–111)
Creatinine, Ser: 2.65 mg/dL — ABNORMAL HIGH (ref 0.44–1.00)
Creatinine, Ser: 2.61 mg/dL — ABNORMAL HIGH (ref 0.44–1.00)
Creatinine, Ser: 2.09 mg/dL — ABNORMAL HIGH (ref 0.44–1.00)
GFR calc Af Amer: 22 mL/min — ABNORMAL LOW (ref >60)
GFR calc Af Amer: 22 mL/min — ABNORMAL LOW (ref >60)
GFR calc Af Amer: 29 mL/min — ABNORMAL LOW (ref >60)
GFR calc non Af Amer: 19 mL/min — ABNORMAL LOW (ref >60)
GFR calc non Af Amer: 19 mL/min — ABNORMAL LOW (ref >60)
GFR calc non Af Amer: 25 mL/min — ABNORMAL LOW (ref >60)
Glucose, Bld: 511 mg/dL (ref 70–99)
Glucose, Bld: 334 mg/dL — ABNORMAL HIGH (ref 70–99)
Glucose, Bld: 64 mg/dL — ABNORMAL LOW (ref 70–99)
Potassium: 3.5 mmol/L (ref 3.5–5.1)
Potassium: 3.5 mmol/L (ref 3.5–5.1)
Potassium: 3.7 mmol/L (ref 3.5–5.1)
Sodium: 134 mmol/L — ABNORMAL LOW (ref 135–145)
Sodium: 137 mmol/L (ref 135–145)
Sodium: 142 mmol/L (ref 135–145)

## 2019-04-14 LAB — CBC WITH DIFFERENTIAL/PLATELET
Abs Immature Granulocytes: 0.04 10*3/uL (ref 0.00–0.07)
Basophils Absolute: 0 10*3/uL (ref 0.0–0.1)
Basophils Relative: 0 %
Eosinophils Absolute: 0 10*3/uL (ref 0.0–0.5)
Eosinophils Relative: 0 %
HCT: 31.1 % — ABNORMAL LOW (ref 36.0–46.0)
Hemoglobin: 10.3 g/dL — ABNORMAL LOW (ref 12.0–15.0)
Immature Granulocytes: 0 %
Lymphocytes Relative: 5 %
Lymphs Abs: 0.7 10*3/uL (ref 0.7–4.0)
MCH: 30.3 pg (ref 26.0–34.0)
MCHC: 33.1 g/dL (ref 30.0–36.0)
MCV: 91.5 fL (ref 80.0–100.0)
Monocytes Absolute: 0.6 10*3/uL (ref 0.1–1.0)
Monocytes Relative: 5 %
Neutro Abs: 10.6 10*3/uL — ABNORMAL HIGH (ref 1.7–7.7)
Neutrophils Relative %: 90 %
Platelets: 118 10*3/uL — ABNORMAL LOW (ref 150–400)
RBC: 3.4 MIL/uL — ABNORMAL LOW (ref 3.87–5.11)
RDW: 12.2 % (ref 11.5–15.5)
WBC: 11.9 10*3/uL — ABNORMAL HIGH (ref 4.0–10.5)
nRBC: 0 % (ref 0.0–0.2)

## 2019-04-14 LAB — COMPREHENSIVE METABOLIC PANEL
ALT: 102 U/L — ABNORMAL HIGH (ref 0–44)
AST: 47 U/L — ABNORMAL HIGH (ref 15–41)
Albumin: 4 g/dL (ref 3.5–5.0)
Alkaline Phosphatase: 364 U/L — ABNORMAL HIGH (ref 38–126)
Anion gap: 26 — ABNORMAL HIGH (ref 5–15)
BUN: 67 mg/dL — ABNORMAL HIGH (ref 6–20)
CO2: 16 mmol/L — ABNORMAL LOW (ref 22–32)
Calcium: 9.2 mg/dL (ref 8.9–10.3)
Chloride: 86 mmol/L — ABNORMAL LOW (ref 98–111)
Creatinine, Ser: 2.88 mg/dL — ABNORMAL HIGH (ref 0.44–1.00)
GFR calc Af Amer: 20 mL/min — ABNORMAL LOW (ref >60)
GFR calc non Af Amer: 17 mL/min — ABNORMAL LOW (ref >60)
Glucose, Bld: 881 mg/dL (ref 70–99)
Potassium: 5.6 mmol/L — ABNORMAL HIGH (ref 3.5–5.1)
Sodium: 128 mmol/L — ABNORMAL LOW (ref 135–145)
Total Bilirubin: 1.2 mg/dL (ref 0.3–1.2)
Total Protein: 6.8 g/dL (ref 6.5–8.1)

## 2019-04-14 LAB — BLOOD GAS, VENOUS
Acid-base deficit: 9.4 mmol/L — ABNORMAL HIGH (ref 0.0–2.0)
Bicarbonate: 16.4 mmol/L — ABNORMAL LOW (ref 20.0–28.0)
O2 Saturation: 77.4 %
Patient temperature: 37
pCO2, Ven: 35 mmHg — ABNORMAL LOW (ref 44.0–60.0)
pH, Ven: 7.28 (ref 7.250–7.430)
pO2, Ven: 48 mmHg — ABNORMAL HIGH (ref 32.0–45.0)

## 2019-04-14 LAB — URINALYSIS, COMPLETE (UACMP) WITH MICROSCOPIC
Bacteria, UA: NONE SEEN
Bilirubin Urine: NEGATIVE
Glucose, UA: >=500 mg/dL — AB
Ketones, ur: 5 mg/dL — AB
Leukocytes,Ua: NEGATIVE
Nitrite: NEGATIVE
Protein, ur: NEGATIVE mg/dL
Specific Gravity, Urine: 1.015 (ref 1.005–1.030)
pH: 5 (ref 5.0–8.0)

## 2019-04-14 LAB — BETA-HYDROXYBUTYRIC ACID: Beta-Hydroxybutyric Acid: 1.05 mmol/L — ABNORMAL HIGH (ref 0.05–0.27)

## 2019-04-14 LAB — LACTIC ACID, PLASMA
Lactic Acid, Venous: 3.9 mmol/L (ref 0.5–1.9)
Lactic Acid, Venous: 4.7 mmol/L (ref 0.5–1.9)
Lactic Acid, Venous: 4 mmol/L (ref 0.5–1.9)
Lactic Acid, Venous: 3.2 mmol/L (ref 0.5–1.9)

## 2019-04-14 LAB — TROPONIN I: Troponin I: <0.03 ng/mL (ref <0.03)

## 2019-04-14 LAB — SARS CORONAVIRUS 2 BY RT PCR (HOSPITAL ORDER, PERFORMED IN ~~LOC~~ HOSPITAL LAB): SARS Coronavirus 2: NEGATIVE

## 2019-04-14 LAB — PROCALCITONIN: Procalcitonin: 1.82 ng/mL

## 2019-04-14 LAB — ETHANOL: Alcohol, Ethyl (B): <10 mg/dL (ref <10)

## 2019-04-14 IMAGING — DX PORTABLE CHEST - 1 VIEW
1 series · 1 of 1 positions shown · non-contrast
Comparison: [DATE].

CLINICAL DATA: 58-year-old female with fever and vomiting.

EXAM:
PORTABLE CHEST 1 VIEW

[chest ap]
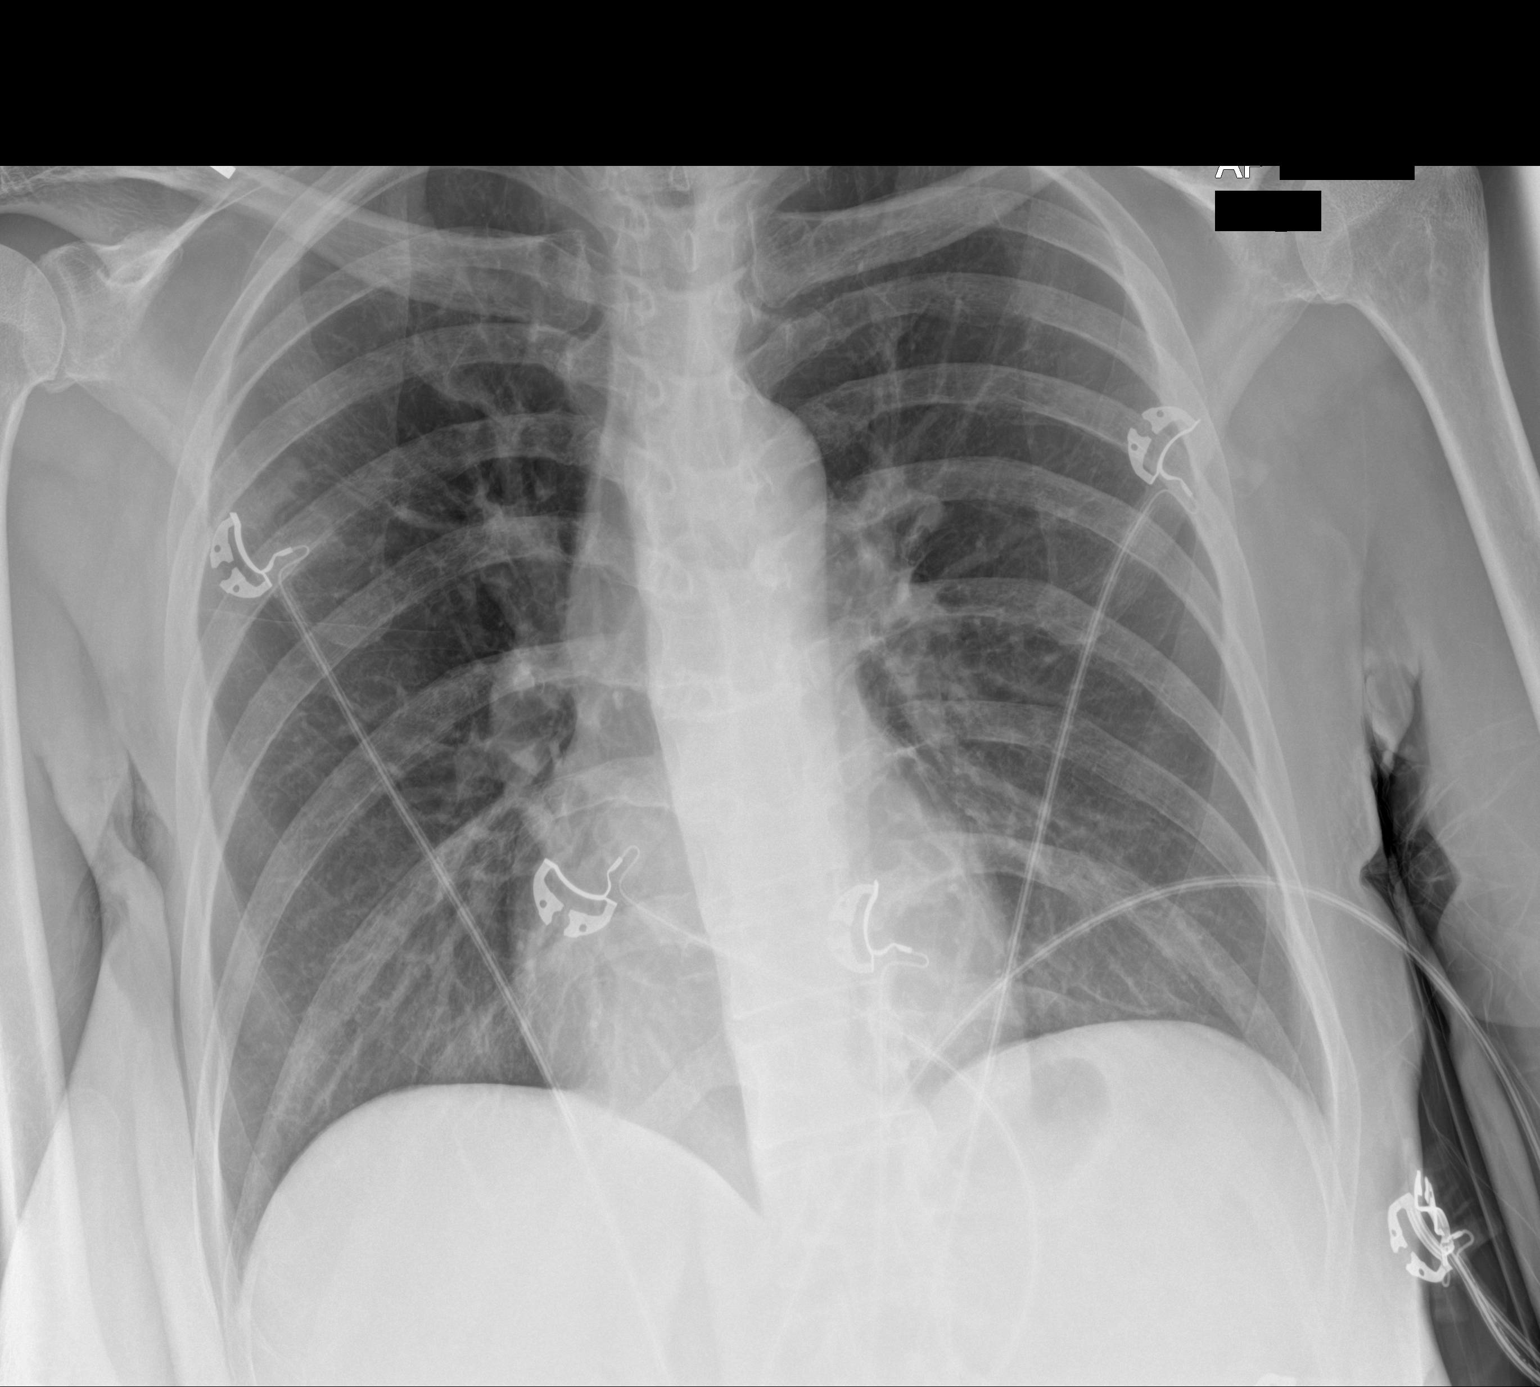

[1 of 1 positions shown; findings below may reference images not displayed]

FINDINGS: Portable AP upright view at [8X] hours. Normal lung volumes and
mediastinal contours. Allowing for portable technique the lungs are
clear. Visualized tracheal air column is within normal limits. No
pneumothorax. Paucity of bowel gas in the upper abdomen. No acute
osseous abnormality identified.
IMPRESSION: Negative portable chest.

## 2019-04-14 MED ORDER — ATORVASTATIN CALCIUM 20 MG PO TABS
80.0000 mg | ORAL_TABLET | Freq: Every evening | ORAL | Status: DC
  Administered 2019-04-14 – 2019-04-15 (×2): 80 mg via ORAL
  Filled 2019-04-14 (×2): qty 4

## 2019-04-14 MED ORDER — INSULIN ASPART 100 UNIT/ML ~~LOC~~ SOLN
0.0000 [IU] | Freq: Every day | SUBCUTANEOUS | Status: DC
  Administered 2019-04-14: 4 [IU] via SUBCUTANEOUS
  Administered 2019-04-15: 3 [IU] via SUBCUTANEOUS
  Filled 2019-04-14 (×2): qty 1

## 2019-04-14 MED ORDER — ACETAMINOPHEN 325 MG PO TABS: 650.0000 mg | ORAL_TABLET | ORAL | Status: DC | PRN

## 2019-04-14 MED ORDER — SODIUM CHLORIDE 0.9 % IV SOLN: INTRAVENOUS | Status: DC

## 2019-04-14 MED ORDER — POTASSIUM CHLORIDE IN NACL 40-0.9 MEQ/L-% IV SOLN
INTRAVENOUS | Status: DC
  Administered 2019-04-14: 150 mL/h via INTRAVENOUS
  Filled 2019-04-14 (×3): qty 1000

## 2019-04-14 MED ORDER — INSULIN ASPART 100 UNIT/ML ~~LOC~~ SOLN
0.0000 [IU] | Freq: Three times a day (TID) | SUBCUTANEOUS | Status: DC
  Administered 2019-04-15: 3 [IU] via SUBCUTANEOUS
  Administered 2019-04-15 – 2019-04-16 (×2): 1 [IU] via SUBCUTANEOUS
  Administered 2019-04-16: 2 [IU] via SUBCUTANEOUS
  Filled 2019-04-14 (×4): qty 1

## 2019-04-14 MED ORDER — MAGNESIUM HYDROXIDE 400 MG/5ML PO SUSP: 30.0000 mL | Freq: Every day | ORAL | Status: DC | PRN

## 2019-04-14 MED ORDER — METRONIDAZOLE IN NACL 5-0.79 MG/ML-% IV SOLN
500.0000 mg | Freq: Once | INTRAVENOUS | Status: AC
  Administered 2019-04-14: 500 mg via INTRAVENOUS
  Filled 2019-04-14: qty 100

## 2019-04-14 MED ORDER — METOPROLOL TARTRATE 25 MG PO TABS
25.0000 mg | ORAL_TABLET | Freq: Every day | ORAL | Status: DC
  Administered 2019-04-14: 11:00:00 25 mg via ORAL
  Filled 2019-04-14: qty 1

## 2019-04-14 MED ORDER — SODIUM CHLORIDE 0.9 % IV SOLN: INTRAVENOUS | Status: AC

## 2019-04-14 MED ORDER — SODIUM CHLORIDE 0.9 % IV BOLUS
1000.0000 mL | Freq: Once | INTRAVENOUS | Status: AC
  Administered 2019-04-14: 05:00:00 1000 mL via INTRAVENOUS

## 2019-04-14 MED ORDER — TRAZODONE HCL 50 MG PO TABS
25.0000 mg | ORAL_TABLET | Freq: Every day | ORAL | Status: DC
  Administered 2019-04-14 – 2019-04-15 (×2): 25 mg via ORAL
  Filled 2019-04-14 (×2): qty 1

## 2019-04-14 MED ORDER — SODIUM CHLORIDE 0.9 % IV SOLN
Freq: Once | INTRAVENOUS | Status: AC
  Administered 2019-04-14: 08:00:00 via INTRAVENOUS

## 2019-04-14 MED ORDER — DEXTROSE-NACL 5-0.45 % IV SOLN
INTRAVENOUS | Status: DC
  Administered 2019-04-14: 15:00:00 via INTRAVENOUS

## 2019-04-14 MED ORDER — ALUM & MAG HYDROXIDE-SIMETH 200-200-20 MG/5ML PO SUSP: 30.0000 mL | Freq: Four times a day (QID) | ORAL | Status: DC | PRN

## 2019-04-14 MED ORDER — ASPIRIN EC 81 MG PO TBEC
81.0000 mg | DELAYED_RELEASE_TABLET | Freq: Every day | ORAL | Status: DC
  Administered 2019-04-14 – 2019-04-16 (×3): 81 mg via ORAL
  Filled 2019-04-14 (×3): qty 1

## 2019-04-14 MED ORDER — VANCOMYCIN HCL IN DEXTROSE 1-5 GM/200ML-% IV SOLN: 1000.0000 mg | Freq: Once | INTRAVENOUS | Status: DC

## 2019-04-14 MED ORDER — SODIUM POLYSTYRENE SULFONATE 15 GM/60ML PO SUSP: 30.0000 g | Freq: Once | ORAL | Status: DC

## 2019-04-14 MED ORDER — SODIUM CHLORIDE 0.9 % IV BOLUS
500.0000 mL | Freq: Once | INTRAVENOUS | Status: AC
  Administered 2019-04-14: 500 mL via INTRAVENOUS

## 2019-04-14 MED ORDER — SODIUM CHLORIDE 0.9 % IV SOLN
INTRAVENOUS | Status: DC
  Administered 2019-04-14: 23:00:00 via INTRAVENOUS

## 2019-04-14 MED ORDER — SODIUM BICARBONATE 8.4 % IV SOLN
50.0000 meq | Freq: Once | INTRAVENOUS | Status: AC
  Administered 2019-04-14: 50 meq via INTRAVENOUS
  Filled 2019-04-14: qty 50

## 2019-04-14 MED ORDER — INSULIN ASPART 100 UNIT/ML ~~LOC~~ SOLN
10.0000 [IU] | Freq: Once | SUBCUTANEOUS | Status: AC
  Administered 2019-04-14: 10 [IU] via INTRAVENOUS
  Filled 2019-04-14: qty 1

## 2019-04-14 MED ORDER — DEXTROSE 50 % IV SOLN: 1.0000 | Freq: Once | INTRAVENOUS | Status: DC

## 2019-04-14 MED ORDER — SODIUM CHLORIDE 0.9 % IV SOLN
2.0000 g | Freq: Once | INTRAVENOUS | Status: AC
  Administered 2019-04-14: 2 g via INTRAVENOUS
  Filled 2019-04-14: qty 2

## 2019-04-14 MED ORDER — POTASSIUM CHLORIDE CRYS ER 20 MEQ PO TBCR
40.0000 meq | EXTENDED_RELEASE_TABLET | Freq: Once | ORAL | Status: AC
  Administered 2019-04-14: 40 meq via ORAL
  Filled 2019-04-14: qty 2

## 2019-04-14 MED ORDER — DEXTROSE-NACL 5-0.45 % IV SOLN: INTRAVENOUS | Status: DC

## 2019-04-14 MED ORDER — HEPARIN SODIUM (PORCINE) 5000 UNIT/ML IJ SOLN
5000.0000 [IU] | Freq: Three times a day (TID) | INTRAMUSCULAR | Status: DC
  Administered 2019-04-14 – 2019-04-16 (×7): 5000 [IU] via SUBCUTANEOUS
  Filled 2019-04-14 (×7): qty 1

## 2019-04-14 MED ORDER — SODIUM CHLORIDE 0.9 % IV SOLN: 1.0000 g | Freq: Once | INTRAVENOUS | Status: DC

## 2019-04-14 MED ORDER — INSULIN REGULAR(HUMAN) IN NACL 100-0.9 UT/100ML-% IV SOLN
INTRAVENOUS | Status: DC
  Administered 2019-04-14: 06:00:00 10.8 [IU]/h via INTRAVENOUS
  Filled 2019-04-14: qty 100

## 2019-04-14 MED ORDER — ENOXAPARIN SODIUM 40 MG/0.4ML ~~LOC~~ SOLN: 40.0000 mg | SUBCUTANEOUS | Status: DC

## 2019-04-14 MED ORDER — VANCOMYCIN HCL 10 G IV SOLR
1750.0000 mg | Freq: Once | INTRAVENOUS | Status: AC
  Administered 2019-04-14: 1750 mg via INTRAVENOUS
  Filled 2019-04-14: qty 1750

## 2019-04-14 MED ORDER — CALCIUM GLUCONATE-NACL 1-0.675 GM/50ML-% IV SOLN
1.0000 g | Freq: Once | INTRAVENOUS | Status: DC
  Filled 2019-04-14: qty 50

## 2019-04-14 MED ORDER — INSULIN GLARGINE 100 UNIT/ML ~~LOC~~ SOLN
10.0000 [IU] | SUBCUTANEOUS | Status: DC
  Administered 2019-04-14: 10 [IU] via SUBCUTANEOUS
  Filled 2019-04-14: qty 0.1

## 2019-04-14 MED ORDER — INSULIN REGULAR(HUMAN) IN NACL 100-0.9 UT/100ML-% IV SOLN
INTRAVENOUS | Status: DC
  Administered 2019-04-14: 05:00:00 5.4 [IU]/h via INTRAVENOUS
  Filled 2019-04-14: qty 100

## 2019-04-14 NOTE — TOC Initial Note (Signed)
Patient Details  Name: Susan Navarro MRN: [id number] Date of Birth: 04/01/1961  Transition of Care [facility]) CM/SW Contact:    Katrina Stack, RN Phone Number: 04/14/2019, 4:31 PM  Clinical Narrative:                no CM consult and low risk for readmission. Presented with fever. Found to have DKA. On insulin drip. Negative for covid.  No discharge needs identified by members of care team at present time         Patient Goals and CMS Choice        Expected Discharge Plan and Services                                                Prior Living Arrangements/Services                       Activities of Daily Living Home Assistive Devices/Equipment: None ADL Screening (condition at time of admission) Patient's cognitive ability adequate to safely complete daily activities?: Yes Is the patient deaf or have difficulty hearing?: No Does the patient have difficulty seeing, even when wearing glasses/contacts?: No Does the patient have difficulty concentrating, remembering, or making decisions?: No Patient able to express need for assistance with ADLs?: Yes Does the patient have difficulty dressing or bathing?: No Independently performs ADLs?: Yes (appropriate for developmental age) Does the patient have difficulty walking or climbing stairs?: No Weakness of Legs: None Weakness of Arms/Hands: None  Permission Sought/Granted                  Emotional Assessment              Admission diagnosis:  Hyperkalemia [E87.5] Hyponatremia [E87.1] Hyperglycemia [R73.9] AKI (acute kidney injury) ([place]) [N17.9] Diabetic ketoacidosis without coma associated with type 1 diabetes mellitus ([place]) [E10.10] Patient Active Problem List   Diagnosis Date Noted  . DKA (diabetic ketoacidoses) ([place]) 12/09/2018   PCP:  Rochel Brome, MD Pharmacy:   [facility], [place] [address] [place] [address] Phone: [phone] Fax: [phone]     Social Determinants of Health (SDOH) Interventions    Readmission Risk Interventions No flowsheet data found.

## 2019-04-14 NOTE — Progress Notes (Signed)
Pharmacy Electrolyte Monitoring Consult:  Pharmacy consulted to assist in monitoring and replacing electrolytes in this 58 y.o. female admitted on 04/14/2019 with Hyperglycemia   Labs:  Sodium (mmol/L)  Date Value  04/14/2019 142   Potassium (mmol/L)  Date Value  04/14/2019 3.7   Calcium (mg/dL)  Date Value  04/14/2019 9.4   Albumin (g/dL)  Date Value  04/14/2019 4.0    Assessment/Plan: Patient receiving insulin infusion for DKA. MIVF transitioned to D5 + 1/2NS at 50 this pm.   Will order additional potasium 40 mEq PO x 1 to maintain potassium ~ 4 while patient is on insulin infusion. Patient with stage III CKD, will need to be conservative with replacement.   Pharmacy will continue to monitor and adjust per consult.   Tobie Lords, PharmD, BCPS Clinical Pharmacist 04/14/2019

## 2019-04-14 NOTE — Progress Notes (Signed)
Pharmacy Electrolyte Monitoring Consult:  Pharmacy consulted to assist in monitoring and replacing electrolytes in this 58 y.o. female admitted on 04/14/2019 with Hyperglycemia   Labs:  Sodium (mmol/L)  Date Value  04/14/2019 137   Potassium (mmol/L)  Date Value  04/14/2019 3.5   Calcium (mg/dL)  Date Value  04/14/2019 9.5   Albumin (g/dL)  Date Value  04/14/2019 4.0    Assessment/Plan: Patient receiving insulin infusion for DKA. MIVF transitioned to NS/40K at 125 this am.   Will order additional potasium 40 mEq PO x 1 to maintain potassium ~ 4 while patient is on insulin infusion. Patient with stage III CKD, will need to be conservative with replacement.   Pharmacy will continue to monitor and adjust per consult.    Brantly Kalman L 04/14/2019 2:37 PM

## 2019-04-14 NOTE — Progress Notes (Signed)
CODE SEPSIS - PHARMACY COMMUNICATION  **Broad Spectrum Antibiotics should be administered within 1 hour of Sepsis diagnosis**  Time Code Sepsis Called/Page Received: 0502  Antibiotics Ordered: vanc/cefepime/flagyl  Time of 1st antibiotic administration: 0519  Additional action taken by pharmacy:   If necessary, Name of Provider/Nurse Contacted:     Tobie Lords ,PharmD Clinical Pharmacist  04/14/2019  5:27 AM

## 2019-04-14 NOTE — Progress Notes (Signed)
Inpatient Diabetes Program Recommendations  AACE/ADA: New Consensus Statement on Inpatient Glycemic Control (2015)  Target Ranges:  Prepandial:   less than 140 mg/dL      Peak postprandial:   less than 180 mg/dL (1-2 hours)      Critically ill patients:  140 - 180 mg/dL   Lab Results  Component Value Date   GLUCAP 394 (H) 04/14/2019   HGBA1C 9.8 (H) 12/13/2018    Review of Glycemic Control Results for Susan Navarro, Susan Navarro (MRN [id number]) as of 04/14/2019 13:50  Ref. Range 04/14/2019 09:17 04/14/2019 10:03 04/14/2019 10:52 04/14/2019 12:06  Glucose-Capillary Latest Ref Range: 70 - 99 mg/dL 468 (H) 458 (H) 414 (H) 394 (H)   Diabetes history: DM Outpatient Diabetes medications:  Lantus 15 units daily, Humalog 5 units tid with meals Current orders for Inpatient glycemic control:  DKA order set- IV insulin  Inpatient Diabetes Program Recommendations:    Referral received.  Based on chart review patient missed last appointment for DM follow-up.  Per H&P, patient missed insulin on 4/22. Unclear if she ran out or if she just missed dose.  Will attempt to talk to patient on 4/24 regarding potential needs for supplies, medications, and DKA prevention.   Thanks,  Adah Perl, RN, BC-ADM Inpatient Diabetes Coordinator Pager [phone] (8a-5p)

## 2019-04-14 NOTE — ED Notes (Signed)
Dr. Beather Arbour made aware of pts lactic acid of 3.9 and glucose of 881.

## 2019-04-14 NOTE — ED Triage Notes (Signed)
Pt stated that she does not feel well and she  Was vomiting yesterday last time was around 0000 but she does not feel good. EMS stated that pts glucose was elevated, pt stated that she takes her lantus during the day.

## 2019-04-14 NOTE — Progress Notes (Signed)
Dana NP notified of patients lactic of 3.2. No additional orders given.

## 2019-04-14 NOTE — Consult Note (Signed)
Name: Susan Navarro MRN: [id number] DOB: [birth year]    ADMISSION DATE:  04/14/2019 CONSULTATION DATE: 04/14/2019  REFERRING MD: Dr. Sidney Ace   CHIEF COMPLAINT: Hyperglycemia   BRIEF PATIENT DESCRIPTION:  58 yo female admitted with DKA requiring insulin gtt   SIGNIFICANT EVENTS/STUDIES:  04/23-Pt admitted to the stepdown unit   HISTORY OF PRESENT ILLNESS:   This is a 58 yo female with a PMH of HTN and Type I Diabetes Mellitus.  She presented to [facility] ER on 04/23 with fever, hyperglycemia, and vomiting onset of symptoms 2 days prior to presentation.  Per ER notes the pt did not take her lantus or lispro yesterday 04/22. Lab results ruled pt in for DKA, therefore insulin gtt initiated.  COVID-19 rule out. She was subsequently admitted to the stepdown unit for additional workup and treatment.    PAST MEDICAL HISTORY :   has a past medical history of Diabetes mellitus without complication ([place]) and Hypertension.  has no past surgical history on file. Prior to Admission medications   Medication Sig Start Date End Date Taking? Authorizing Provider  aspirin EC 81 MG tablet Take 81 mg by mouth daily. 02/09/17  Yes [provider]  atorvastatin (LIPITOR) 80 MG tablet Take 80 mg by mouth every evening. 11/23/18 11/23/19 Yes [provider]  Insulin Glargine (LANTUS SOLOSTAR) 100 UNIT/ML Solostar Pen Inject 15 Units into the skin daily. 11/30/18  Yes [provider]  insulin lispro (HUMALOG KWIKPEN) 100 UNIT/ML KwikPen Inject 5 Units into the skin 3 (three) times daily before meals. 11/30/18 11/30/19 Yes [provider]  metoprolol tartrate (LOPRESSOR) 25 MG tablet Take 25 mg by mouth daily. 11/23/18 11/23/19 Yes [provider]  acetaminophen (TYLENOL) 325 MG tablet Take 325 mg by mouth every 4 (four) hours as needed. 05/06/18 05/06/19  [provider]   No Known Allergies  FAMILY HISTORY:  family history includes Diabetes in her mother. SOCIAL  HISTORY:  reports that she has never smoked. She has never used smokeless tobacco. She reports that she does not drink alcohol.  REVIEW OF SYSTEMS: Positives in BOLD  Constitutional: fever, chills, weight loss, malaise/fatigue and diaphoresis.  HENT: Negative for hearing loss, ear pain, nosebleeds, congestion, sore throat, neck pain, tinnitus and ear discharge.   Eyes: Negative for blurred vision, double vision, photophobia, pain, discharge and redness.  Respiratory: Negative for cough, hemoptysis, sputum production, shortness of breath, wheezing and stridor.   Cardiovascular: Negative for chest pain, palpitations, orthopnea, claudication, leg swelling and PND.  Gastrointestinal: heartburn, nausea, vomiting, abdominal pain, diarrhea, constipation, blood in stool and melena.  Genitourinary: Negative for dysuria, urgency, frequency, hematuria and flank pain.  Musculoskeletal: Negative for myalgias, back pain, joint pain and falls.  Skin: Negative for itching and rash.  Neurological: Negative for dizziness, tingling, tremors, sensory change, speech change, focal weakness, seizures, loss of consciousness, weakness and headaches.  Endo/Heme/Allergies: hyperglycemia, environmental allergies and polydipsia. Does not bruise/bleed easily.  SUBJECTIVE:  No complaints at this time  VITAL SIGNS: Temp:  [98.7 F (37.1 C)] 98.7 F (37.1 C) (04/23 0928) Pulse Rate:  [90-95] 90 (04/23 0928) Resp:  [16-18] 18 (04/23 0928) BP: (109-124)/(45-60) 124/57 (04/23 0928) SpO2:  [98 %-100 %] 98 % (04/23 0928) Weight:  [68 kg] 68 kg (04/23 0410)  PHYSICAL EXAMINATION: General: well developed, well nourished female, NAD  Neuro: alert and oriented, follows commands  HEENT: supple, no JVD  Cardiovascular: NSR, no R/G Lungs: clear throughout, even, non labored  Abdomen: +BS x4, soft,  non distended  Musculoskeletal: normal bulk and tone, no edema  Skin: intact no rashes or lesions present   Recent Labs  Lab  04/14/19 0416  NA 128*  K 5.6*  CL 86*  CO2 16*  BUN 67*  CREATININE 2.88*  GLUCOSE 881*   Recent Labs  Lab 04/14/19 0416  HGB 10.3*  HCT 31.1*  WBC 11.9*  PLT 118*   Dg Chest Port 1 View  Result Date: 04/14/2019 CLINICAL DATA:  58 year old female with fever and vomiting. EXAM: PORTABLE CHEST 1 VIEW COMPARISON:  12/09/2018. FINDINGS: Portable AP upright view at 0451 hours. Normal lung volumes and mediastinal contours. Allowing for portable technique the lungs are clear. Visualized tracheal air column is within normal limits. No pneumothorax. Paucity of bowel gas in the upper abdomen. No acute osseous abnormality identified. IMPRESSION: Negative portable chest. Electronically Signed   By: Genevie Ann M.D.   On: 04/14/2019 05:24    ASSESSMENT / PLAN:  Diabetic Ketoacidosis Continue insulin gtt until anion gap closed and serum CO2 >20 BMP q4hrs and CBG's q1hr while on insulin gtt  Hypertension  Continuous telemetry monitoring  Continue outpatient cardiac medications   Hyponatremia and hyperkalemia in setting of DKA  Acute renal failure secondary to dehydration  Lactic acidosis  Trend BMP and lactic acid  Replace electrolytes as indicated  Monitor UOP Avoid nephrotoxic medications  IV fluids per DKA protocol   Leukocytosis no obvious source of infection  Trend WBC and monitor fever curve  Trend PCT  Follow cultures  Discontinue vancomycin and cefepime  Will r/o COVID-19-continue droplet and contact precautions for now   Transaminitis and elevated alk phos  Ethanol level and urine drug screen pending  Trend hepatic panel   Anemia without obvious acute blood loss Thrombocytopenia  VTE px: subq heparin  Trend CBC  Monitor for s/sx of bleeding and transfuse for hgb <7  Marda Stalker, [place] Pager [phone] (please enter 7 digits) PCCM Consult Pager [phone] (please enter 7 digits)

## 2019-04-14 NOTE — H&P (Signed)
[facility] [place] NAME: Susan Navarro    MR#:  [id number]  DATE OF BIRTH:  [birth year]  DATE OF ADMISSION:  04/14/2019  PRIMARY CARE PHYSICIAN: Rochel Brome, MD   REQUESTING/REFERRING PHYSICIAN: Lurline Hare, MD  CHIEF COMPLAINT:   Chief Complaint  Patient presents with   Hyperglycemia  Vomiting  HISTORY OF PRESENT ILLNESS:  Susan Navarro  is a 58 y.o. African-American female with a known history of type 2 diabetes mellitus, hypertension dyslipidemia, and multiple medical problems that will be mentioned below who presented to the emergency room with acute onset of recurrent vomiting since yesterday morning.  She stated that she vomited twice.  No bilious vomitus or hematemesis.  She denied any diarrhea.  There was a questionable tactile fever however she did not check her temperature and did not have any chills.  No cough or wheezing or dyspnea or chest pain.  No recent sick exposure.  She has not been traveling out of state and has not been exposed to large crowds..  She admits to polyuria and polydipsia.  No dysuria or hematuria or flank pain.  She missed her Lantus last night and her short acting insulin yesterday.  Upon presentation to the emergency room, vital signs were within normal.  Labs were remarkable for hyponatremia and hyper per kalemia as below and significantly elevated blood glucose of 881.  Urinalysis currently pending and her CBC showed WBC of 11.9 with chronic anemia.  Her lactic acid was elevated at 3.9.  She had 2 blood cultures drawn.  She was given IV cefepime and vancomycin as well as Flagyl.  She was also given 10 units of IV NovoLog and was started on IV insulin drip.  For hyperkalemia she was ordered IV calcium gluconate 30 g of p.o. Kayexalate and an amp of sodium bicarbonate.  Her venous blood gas did show a pH of 7.28 with bicarbonate of 16.4.Her chest x-ray showed no acute cardiopulmonary disease.  EKG showed  normal sinus rhythm with rate of 95.  She will be admitted to a stepdown unit for further evaluation and management. 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 difficile colitis, history of DKA with type 1 diabetes mellitus, history of hypoglycemia, syncope, diabetic foot infection, diabetic retinopathy, stage III chronic kidney disease  PAST SURGICAL HISTORY:  No past surgical history on file.  SOCIAL HISTORY:   Social History   Tobacco Use   Smoking status: Never Smoker   Smokeless tobacco: Never Used  Substance Use Topics   Alcohol use: No    FAMILY HISTORY:   Family History  Problem Relation Age of Onset   Diabetes Mother     DRUG ALLERGIES:  No Known Allergies  REVIEW OF SYSTEMS:   ROS As per history of present illness. All pertinent systems were reviewed above. Constitutional,  HEENT, cardiovascular, respiratory, GI, GU, musculoskeletal, neuro, psychiatric, endocrine,  integumentary and hematologic systems were reviewed and are otherwise  negative/unremarkable except for positive findings mentioned above in the HPI.   MEDICATIONS AT HOME:   Prior to Admission medications   Medication Sig Start Date End Date Taking? Authorizing Provider  acetaminophen (TYLENOL) 325 MG tablet Take 325 mg by mouth every 4 (four) hours as needed. 05/06/18 05/06/19  [provider]  aspirin EC 81 MG tablet Take  81 mg by mouth daily. 02/09/17   [provider]  atorvastatin (LIPITOR) 80 MG tablet Take 80 mg by mouth every evening. 11/23/18 11/23/19  [provider]  Insulin Glargine (LANTUS SOLOSTAR) 100 UNIT/ML Solostar Pen Inject 15 Units into the skin daily. 11/30/18   [provider]  insulin lispro (HUMALOG KWIKPEN) 100 UNIT/ML KwikPen Inject 5 Units into the skin 3 (three) times daily before  meals. 11/30/18 11/30/19  [provider]  metoprolol tartrate (LOPRESSOR) 25 MG tablet Take 25 mg by mouth daily. 11/23/18 11/23/19  [provider]      VITAL SIGNS:  Blood pressure (!) 109/45, pulse 95, temperature 98.7 F (37.1 C), temperature source Oral, resp. rate 18, height 5' 6"  (1.676 m), weight 68 kg, SpO2 100 %.  PHYSICAL EXAMINATION:  Physical Exam  GENERAL:  58 y.o.-year-old middle-aged African-American female patient lying in the bed with no acute distress.  EYES: Pupils equal, round, reactive to light and accommodation. No scleral icterus. Extraocular muscles intact.  HEENT: Head atraumatic, normocephalic. Oropharynx with dry mucous membrane and tongue and nasopharynx clear.  NECK:  Supple, no jugular venous distention. No thyroid enlargement, no tenderness.  LUNGS: Normal breath sounds bilaterally, no wheezing, rales,rhonchi or crepitation. No use of accessory muscles of respiration.  CARDIOVASCULAR: Regular rate and rhythm, S1, S2 normal. No murmurs, rubs, or gallops.  ABDOMEN: Soft, nondistended, nontender. Bowel sounds present. No organomegaly or mass.  EXTREMITIES: No pedal edema, cyanosis, or clubbing.  NEUROLOGIC: Cranial nerves II through XII are intact. Muscle strength 5/5 in all extremities. Sensation intact. Gait not checked.  PSYCHIATRIC: The patient is alert and oriented x 3.  Normal affect and good eye contact. SKIN: No obvious rash, lesion, or ulcer.   LABORATORY PANEL:   CBC Recent Labs  Lab 04/14/19 0416  WBC 11.9*  HGB 10.3*  HCT 31.1*  PLT 118*   ------------------------------------------------------------------------------------------------------------------  Chemistries  Recent Labs  Lab 04/14/19 0416  NA 128*  K 5.6*  CL 86*  CO2 16*  GLUCOSE 881*  BUN 67*  CREATININE 2.88*  CALCIUM 9.2  AST 47*  ALT 102*  ALKPHOS 364*  BILITOT 1.2    ------------------------------------------------------------------------------------------------------------------  Cardiac Enzymes Recent Labs  Lab 04/14/19 0416  TROPONINI <0.03   ------------------------------------------------------------------------------------------------------------------  RADIOLOGY:  Dg Chest Port 1 View  Result Date: 04/14/2019 CLINICAL DATA:  58 year old female with fever and vomiting. EXAM: PORTABLE CHEST 1 VIEW COMPARISON:  12/09/2018. FINDINGS: Portable AP upright view at 0451 hours. Normal lung volumes and mediastinal contours. Allowing for portable technique the lungs are clear. Visualized tracheal air column is within normal limits. No pneumothorax. Paucity of bowel gas in the upper abdomen. No acute osseous abnormality identified. IMPRESSION: Negative portable chest. Electronically Signed   By: Genevie Ann M.D.   On: 04/14/2019 05:24      IMPRESSION AND PLAN:   #1.  DKA with uncontrolled type 1 diabetes mellitus.The patient will be admitted to a stepdown bed and will be was placed on IV insulin drip per protocol. The patient will be aggressively hydrated with IV normal saline.  Will follow serial BMPs.  One blood glucose is less than 250 she will be switched to D5 normal saline.  I contacted the intensivist for notification about the admission.  2.  Hyperkalemia.  This should correct with IV insulin drip.  We will follow her potassium levels.  3.  Acute kidney injury on top of stage III chronic kidney disease.  She will be aggressively hydrated as  mentioned above and will follow her BMP and urine output.  4.  Elevated lactic acid.  The patient has been afebrile and was covered with IV antibiotics in the ER.  Her chest x-ray showed no acute cardiopulmonary disease.  I will hold off further IV antibiotics pending urinalysis and blood cultures results.  5.  Hypertension.  Her blood pressure has been borderline.  Her antihypertensives will be placed with  conservative holding parameters.  6.  History of CVA.  We will continue her aspirin and statin therapy.  7.  Dyslipidemia.  Statin therapy will be resumed.  8.  DVT prophylaxis.  Subcutaneous heparin.   All the records are reviewed and case discussed with ED provider. The plan of care was discussed in details with the patientI answered all questions. The patient agreed to proceed with the above mentioned plan. Further management will depend upon hospital course.   CODE STATUS: Full code  TOTAL TIME TAKING CARE OF THIS PATIENT: 55 minutes.    Christel Mormon M.D on 04/14/2019 at 5:52 AM  Pager - [phone]  After 6pm go to [email or web address] - password [email or web address] [facility]  [employer] [place] [facility]  Office  [phone]  CC: Primary care physician; Rochel Brome, MD   Note: This dictation was prepared with Dragon dictation along with smaller phrase technology. Any transcriptional errors that result from this process are unintentional.

## 2019-04-14 NOTE — Progress Notes (Signed)
[facility] at [place] NAME: Susan Navarro    MR#:  [id number]  DATE OF BIRTH:  02-Mar-1961  SUBJECTIVE:  CHIEF COMPLAINT:   Chief Complaint  Patient presents with  . Hyperglycemia   No new complaints.  No fevers.  No nausea vomiting this morning. REVIEW OF SYSTEMS:  Review of Systems  Constitutional: Negative for chills and fever.  HENT: Negative for hearing loss and tinnitus.   Eyes: Negative for blurred vision and double vision.  Respiratory: Negative for cough, hemoptysis and shortness of breath.   Cardiovascular: Negative for chest pain and palpitations.  Gastrointestinal: Negative for abdominal pain.       Nausea and vomiting resolved.  Genitourinary: Negative for dysuria and urgency.  Musculoskeletal: Negative for myalgias and neck pain.  Skin: Negative for itching and rash.  Neurological: Negative for dizziness and headaches.  Psychiatric/Behavioral: Negative for depression and hallucinations.    DRUG ALLERGIES:  No Known Allergies VITALS:  Blood pressure 115/60, pulse 75, temperature 98.7 F (37.1 C), temperature source Oral, resp. rate (!) 21, height 5' 6"  (1.676 m), weight 68 kg, SpO2 100 %. PHYSICAL EXAMINATION:   Physical Exam  Constitutional: She is oriented to person, place, and time. She appears well-developed.  HENT:  Head: Normocephalic and atraumatic.  Right Ear: External ear normal.  Eyes: Pupils are equal, round, and reactive to light. Conjunctivae are normal. Right eye exhibits no discharge.  Neck: Normal range of motion. Neck supple. No thyromegaly present.  Cardiovascular: Normal rate, regular rhythm and normal heart sounds.  Respiratory: Effort normal. No respiratory distress.  GI: Soft. Bowel sounds are normal. She exhibits no distension.  Musculoskeletal: Normal range of motion.        General: No edema.  Neurological: She is alert and oriented to person, place, and time.  Skin: Skin is warm. She is  not diaphoretic. No erythema.  Psychiatric: She has a normal mood and affect. Her behavior is normal.   LABORATORY PANEL:  Female CBC Recent Labs  Lab 04/14/19 0416  WBC 11.9*  HGB 10.3*  HCT 31.1*  PLT 118*   ------------------------------------------------------------------------------------------------------------------ Chemistries  Recent Labs  Lab 04/14/19 0416 04/14/19 1000  NA 128* 134*  K 5.6* 3.5  CL 86* 97*  CO2 16* 21*  GLUCOSE 881* 511*  BUN 67* 65*  CREATININE 2.88* 2.65*  CALCIUM 9.2 8.8*  AST 47*  --   ALT 102*  --   ALKPHOS 364*  --   BILITOT 1.2  --    RADIOLOGY:  Dg Chest Port 1 View  Result Date: 04/14/2019 CLINICAL DATA:  58 year old female with fever and vomiting. EXAM: PORTABLE CHEST 1 VIEW COMPARISON:  12/09/2018. FINDINGS: Portable AP upright view at 0451 hours. Normal lung volumes and mediastinal contours. Allowing for portable technique the lungs are clear. Visualized tracheal air column is within normal limits. No pneumothorax. Paucity of bowel gas in the upper abdomen. No acute osseous abnormality identified. IMPRESSION: Negative portable chest. Electronically Signed   By: Genevie Ann M.D.   On: 04/14/2019 05:24   ASSESSMENT AND PLAN:   #1.  DKA with uncontrolled type 1 diabetes mellitus. Patient admitted to the ICU and started on insulin drip.  On IV fluids.   Anion gap was 26.  Gradually improving with ongoing management with insulin drip.  To follow-up BMP q. 4 hourly with goal to wean off insulin drip once anion gap is corrected.   Will initiate diet and resume subacute  insulin once anion gap is corrected.  2.  Hyperkalemia.  Resolved with insulin drip.  Recent potassium level down to 3.5. Getting potassium supplementation through IV infusion since patient still on insulin drip  3.  Acute kidney injury on top of stage III chronic kidney disease.   Renal function gradually improving with IV fluids.  Follow-up on renal function in a.m.     4.  Elevated lactic acid with leukocytosis Likely reactive and secondary to dehydration No obvious source of infection found yet. Patient was initially placed empirically on IV vancomycin and cefepime which has been discontinued. COVID test negative Follow-up on repeat labs in a.m.  5.  Hypertension.  Her blood pressure has been borderline.  Her antihypertensives will be placed with conservative holding parameters.  6.  History of CVA.  We will continue her aspirin and statin therapy.  7.  Dyslipidemia.  Statin therapy will be resumed.  8.    Hyponatremia  Secondary to hyperglycemia  Improving with correction of hyperglycemia   DVT prophylaxis.  Subcutaneous heparin.   All the records are reviewed and case discussed with Care Management/Social Worker. Management plans discussed with the patient, family and they are in agreement.  CODE STATUS: Full Code  TOTAL TIME TAKING CARE OF THIS PATIENT: 37 minutes.   More than 50% of the time was spent in counseling/coordination of care: YES  POSSIBLE D/C IN 2 DAYS, DEPENDING ON CLINICAL CONDITION.   Susan Navarro M.D on 04/14/2019 at 1:45 PM  Between 7am to 6pm - Pager - [phone]  After 6pm go to [email or web address] - password [email or web address] [facility]  [employer] [place] [facility]  Office  [phone]  CC: Primary care physician; Rochel Brome, MD  Note: This dictation was prepared with Dragon dictation along with smaller phrase technology. Any transcriptional errors that result from this process are unintentional.

## 2019-04-14 NOTE — ED Provider Notes (Signed)
[facility] Emergency Department Provider Note   ____________________________________________   First MD Initiated Contact with Patient 04/14/19 [phone]     (approximate)  I have reviewed the triage vital signs and the nursing notes.   HISTORY  Chief Complaint Vomiting   HPI Susan Navarro is a 58 y.o. female brought to the ED from home with a chief complaint of fever, hyperglycemia and vomiting.  Patient is a type I diabetic who states she vomited 1-2 times yesterday.  Usually takes her Lantus in the morning and did not take it.  Denies associated fever, cough, chest pain, shortness of breath, dysuria, diarrhea.  Denies recent travel, trauma or exposure to persons diagnosed with coronavirus.       Past Medical History:  Diagnosis Date  . Diabetes mellitus without complication ([place])   . Hypertension     Patient Active Problem List   Diagnosis Date Noted  . DKA (diabetic ketoacidoses) ([place]) 12/09/2018    No past surgical history on file.  Prior to Admission medications   Medication Sig Start Date End Date Taking? Authorizing Provider  acetaminophen (TYLENOL) 325 MG tablet Take 325 mg by mouth every 4 (four) hours as needed. 05/06/18 05/06/19  [provider]  aspirin EC 81 MG tablet Take 81 mg by mouth daily. 02/09/17   [provider]  atorvastatin (LIPITOR) 80 MG tablet Take 80 mg by mouth every evening. 11/23/18 11/23/19  [provider]  Insulin Glargine (LANTUS SOLOSTAR) 100 UNIT/ML Solostar Pen Inject 15 Units into the skin daily. 11/30/18   [provider]  insulin lispro (HUMALOG KWIKPEN) 100 UNIT/ML KwikPen Inject 5 Units into the skin 3 (three) times daily before meals. 11/30/18 11/30/19  [provider]  metoprolol tartrate (LOPRESSOR) 25 MG tablet Take 25 mg by mouth daily. 11/23/18 11/23/19  [provider]    Allergies Patient has no known allergies.  Family History  Problem Relation Age  of Onset  . Diabetes Mother     Social History Social History   Tobacco Use  . Smoking status: Never Smoker  . Smokeless tobacco: Never Used  Substance Use Topics  . Alcohol use: No  . Drug use: Not on file    Review of Systems  Constitutional: No fever/chills Eyes: No visual changes. ENT: No sore throat. Cardiovascular: Denies chest pain. Respiratory: Denies shortness of breath. Gastrointestinal: No abdominal pain.  Positive for vomiting.  No diarrhea.  No constipation. Genitourinary: Negative for dysuria. Musculoskeletal: Negative for back pain. Skin: Negative for rash. Neurological: Negative for headaches, focal weakness or numbness.   ____________________________________________   PHYSICAL EXAM:  VITAL SIGNS: ED Triage Vitals  Enc Vitals Group     BP      Pulse      Resp      Temp      Temp src      SpO2      Weight      Height      Head Circumference      Peak Flow      Pain Score      Pain Loc      Pain Edu?      Excl. in [place]?     Constitutional: Alert and oriented.  Cachectic appearing and in mild acute distress. Eyes: Conjunctivae are normal. PERRL. EOMI. Head: Atraumatic. Nose: No congestion/rhinnorhea. Mouth/Throat: Mucous membranes are moist.  Oropharynx non-erythematous. Neck: No stridor.   Cardiovascular: Normal rate, regular rhythm. Grossly normal heart sounds.  Good peripheral circulation. Respiratory: Normal respiratory effort.  No retractions. Lungs CTAB. Gastrointestinal: Soft and nontender to light or deep palpation. No distention. No abdominal bruits. No CVA tenderness. Musculoskeletal: No lower extremity tenderness nor edema.  No joint effusions. Neurologic:  Normal speech and language. No gross focal neurologic deficits are appreciated.  Intermittently confused. Skin:  Skin is warm, dry and intact. No rash noted.  No petechiae. Psychiatric: Mood and affect are normal. Speech and behavior are normal.   ____________________________________________   LABS (all labs ordered are listed, but only abnormal results are displayed)  Labs Reviewed  CBC WITH DIFFERENTIAL/PLATELET - Abnormal; Notable for the following components:      Result Value   WBC 11.9 (*)    RBC 3.40 (*)    Hemoglobin 10.3 (*)    HCT 31.1 (*)    Platelets 118 (*)    Neutro Abs 10.6 (*)    All other components within normal limits  COMPREHENSIVE METABOLIC PANEL - Abnormal; Notable for the following components:   Sodium 128 (*)    Potassium 5.6 (*)    Chloride 86 (*)    CO2 16 (*)    Glucose, Bld 881 (*)    BUN 67 (*)    Creatinine, Ser 2.88 (*)    AST 47 (*)    ALT 102 (*)    Alkaline Phosphatase 364 (*)    GFR calc non Af Amer 17 (*)    GFR calc Af Amer 20 (*)    Anion gap 26 (*)    All other components within normal limits  LACTIC ACID, PLASMA - Abnormal; Notable for the following components:   Lactic Acid, Venous 3.9 (*)    All other components within normal limits  GLUCOSE, CAPILLARY - Abnormal; Notable for the following components:   Glucose-Capillary >600 (*)    All other components within normal limits  BLOOD GAS, VENOUS - Abnormal; Notable for the following components:   pCO2, Ven 35 (*)    pO2, Ven 48.0 (*)    Bicarbonate 16.4 (*)    Acid-base deficit 9.4 (*)    All other components within normal limits  CULTURE, BLOOD (ROUTINE X 2)  CULTURE, BLOOD (ROUTINE X 2)  URINE CULTURE  CULTURE, BLOOD (ROUTINE X 2)  CULTURE, BLOOD (ROUTINE X 2)  TROPONIN I  LACTIC ACID, PLASMA  URINALYSIS, COMPLETE (UACMP) WITH MICROSCOPIC  BETA-HYDROXYBUTYRIC ACID  BASIC METABOLIC PANEL  BASIC METABOLIC PANEL  BASIC METABOLIC PANEL  BASIC METABOLIC PANEL  HEMOGLOBIN A1C  CBC WITH DIFFERENTIAL/PLATELET  URINALYSIS, ROUTINE W REFLEX MICROSCOPIC   ____________________________________________  EKG  ED ECG REPORT I, Nisaiah Bechtol J, the attending physician, personally viewed and interpreted this ECG.   Date:  04/14/2019  EKG Time: 0419  Rate: 95  Rhythm: normal EKG, normal sinus rhythm  Axis: Normal  Intervals:none  ST&T Change: Nonspecific  ____________________________________________  RADIOLOGY  ED MD interpretation:  No acute cardiopulmonary process  Official radiology report(s): Dg Chest Port 1 View  Result Date: 04/14/2019 CLINICAL DATA:  58 year old female with fever and vomiting. EXAM: PORTABLE CHEST 1 VIEW COMPARISON:  12/09/2018. FINDINGS: Portable AP upright view at 0451 hours. Normal lung volumes and mediastinal contours. Allowing for portable technique the lungs are clear. Visualized tracheal air column is within normal limits. No pneumothorax. Paucity of bowel gas in the upper abdomen. No acute osseous abnormality identified. IMPRESSION: Negative portable chest. Electronically Signed   By: Genevie Ann M.D.   On: 04/14/2019 05:24    ____________________________________________  PROCEDURES  Procedure(s) performed (including Critical Care):  Procedures  CRITICAL CARE Performed by: Paulette Blanch   Total critical care time: 30 minutes  Critical care time was exclusive of separately billable procedures and treating other patients.  Critical care was necessary to treat or prevent imminent or life-threatening deterioration.  Critical care was time spent personally by me on the following activities: development of treatment plan with patient and/or surrogate as well as nursing, discussions with consultants, evaluation of patient's response to treatment, examination of patient, obtaining history from patient or surrogate, ordering and performing treatments and interventions, ordering and review of laboratory studies, ordering and review of radiographic studies, pulse oximetry and re-evaluation of patient's condition. ____________________________________________   INITIAL IMPRESSION / ASSESSMENT AND PLAN / ED COURSE  As part of my medical decision making, I reviewed the following  data within the [facility] notes reviewed and incorporated, Labs reviewed, EKG interpreted, Old chart reviewed, Radiograph reviewed and Notes from prior ED visits     Susan Navarro was evaluated in Emergency Department on 04/14/2019 for the symptoms described in the history of present illness. She was evaluated in the context of the global COVID-19 pandemic, which necessitated consideration that the patient might be at risk for infection with the SARS-CoV-2 virus that causes COVID-19. Institutional protocols and algorithms that pertain to the evaluation of patients at risk for COVID-19 are in a state of rapid change based on information released by regulatory bodies including the CDC and federal and state organizations. These policies and algorithms were followed during the patient's care in the ED.    58 year old female with type 1 diabetes who presents with generalized malaise and vomiting.  Differential diagnosis includes but is not limited to ACS, DKA, UTI, sepsis, metabolic etiologies, etc.  Will initiate IV fluid resuscitation, 4 mg IV Zofran and obtain septic work-up.   Clinical Course as of Apr 13 600  Thu Apr 14, 2019  0506 Lab called with critical lactic acid of 3.9 and blood sugar 885.  Will order insulin drip as well as broad-spectrum IV antibiotics.  Discussed case with hospitalist Dr. Sidney Ace who will evaluate patient in emergency department for admission.   [JS]    Clinical Course User Index [JS] Paulette Blanch, MD     ____________________________________________   FINAL CLINICAL IMPRESSION(S) / ED DIAGNOSES  Final diagnoses:  Hyperglycemia  Diabetic ketoacidosis without coma associated with type 1 diabetes mellitus ([place])  AKI (acute kidney injury) ([place])  Hyperkalemia  Hyponatremia     ED Discharge Orders    None       Note:  This document was prepared using Dragon voice recognition software and may include unintentional dictation errors.    Paulette Blanch, MD 04/14/19 [phone]

## 2019-04-14 NOTE — Progress Notes (Signed)
Dana B, notified of patients lactic acid of 4. She will review and place orders.

## 2019-04-14 NOTE — ED Notes (Signed)
ED TO INPATIENT HANDOFF REPORT  ED Nurse Name and Phone #: Anette Guarneri Name/Age/Gender Susan Navarro 58 y.o. female Room/Bed: ED06A/ED06A  Code Status   Code Status: Full Code  Home/SNF/Other Home Patient oriented to: situation Is this baseline? Yes   Triage Complete: Triage complete  Chief Complaint hyperglycemia  Triage Note Pt stated that she does not feel well and she  Was vomiting yesterday last time was around 0000 but she does not feel good. EMS stated that pts glucose was elevated, pt stated that she takes her lantus during the day.   Allergies No Known Allergies  Level of Care/Admitting Diagnosis ED Disposition    ED Disposition Condition [facility] Area: [place] [[id number]]  Level of Care: Stepdown [14]  Covid Evaluation: N/A  Diagnosis: DKA (diabetic ketoacidoses) [facility]) [213086]  Admitting Physician: Christel Mormon [[id number]]  Attending Physician: Christel Mormon [[id number]]  Estimated length of stay: past midnight tomorrow  Certification:: I certify this patient will need inpatient services for at least 2 midnights  PT Class (Do Not Modify): Inpatient [101]  PT Acc Code (Do Not Modify): Private [1]       B Medical/Surgery History Past Medical History:  Diagnosis Date  . Diabetes mellitus without complication ([place])   . Hypertension    No past surgical history on file.   A IV Location/Drains/Wounds Patient Lines/Drains/Airways Status   Active Line/Drains/Airways    Name:   Placement date:   Placement time:   Site:   Days:   Peripheral IV 04/14/19 Right Forearm   04/14/19    0428    Forearm   less than 1   Peripheral IV 04/14/19 Left Antecubital   04/14/19    0428    Antecubital   less than 1   Peripheral IV 04/14/19 Right Forearm   04/14/19    0520    Forearm   less than 1   Wound / Incision (Open or Dehisced) 12/09/18 Diabetic ulcer Foot Right;Other (Comment);Posterior dark purple   12/09/18    1500    Foot   126           Intake/Output Last 24 hours  Intake/Output Summary (Last 24 hours) at 04/14/2019 0608 Last data filed at 04/14/2019 0550 Gross per 24 hour  Intake 100 ml  Output -  Net 100 ml    Labs/Imaging Results for orders placed or performed during the hospital encounter of 04/14/19 (from the past 48 hour(s))  CBC with Differential     Status: Abnormal   Collection Time: 04/14/19  4:16 AM  Result Value Ref Range   WBC 11.9 (H) 4.0 - 10.5 K/uL   RBC 3.40 (L) 3.87 - 5.11 MIL/uL   Hemoglobin 10.3 (L) 12.0 - 15.0 g/dL   HCT 31.1 (L) 36.0 - 46.0 %   MCV 91.5 80.0 - 100.0 fL   MCH 30.3 26.0 - 34.0 pg   MCHC 33.1 30.0 - 36.0 g/dL   RDW 12.2 11.5 - 15.5 %   Platelets 118 (L) 150 - 400 K/uL    Comment: Immature Platelet Fraction may be clinically indicated, consider ordering this additional test [id number]    nRBC 0.0 0.0 - 0.2 %   Neutrophils Relative % 90 %   Neutro Abs 10.6 (H) 1.7 - 7.7 K/uL   Lymphocytes Relative 5 %   Lymphs Abs 0.7 0.7 - 4.0 K/uL   Monocytes Relative 5 %   Monocytes Absolute 0.6 0.1 -  1.0 K/uL   Eosinophils Relative 0 %   Eosinophils Absolute 0.0 0.0 - 0.5 K/uL   Basophils Relative 0 %   Basophils Absolute 0.0 0.0 - 0.1 K/uL   Immature Granulocytes 0 %   Abs Immature Granulocytes 0.04 0.00 - 0.07 K/uL    Comment: Performed at [facility], [address] [place], [place] [address]  Comprehensive metabolic panel     Status: Abnormal   Collection Time: 04/14/19  4:16 AM  Result Value Ref Range   Sodium 128 (L) 135 - 145 mmol/L    Comment: LYTES REPEATED TO CONFIRM SMA   Potassium 5.6 (H) 3.5 - 5.1 mmol/L   Chloride 86 (L) 98 - 111 mmol/L   CO2 16 (L) 22 - 32 mmol/L   Glucose, Bld 881 (HH) 70 - 99 mg/dL    Comment: CRITICAL RESULT CALLED TO, READ BACK BY AND VERIFIED WITH BRITTNEY Jerol Rufener AT 0456 04/14/2019 SMA    BUN 67 (H) 6 - 20 mg/dL   Creatinine, Ser 2.88 (H) 0.44 - 1.00 mg/dL   Calcium 9.2 8.9 - 10.3 mg/dL   Total Protein 6.8 6.5  - 8.1 g/dL   Albumin 4.0 3.5 - 5.0 g/dL   AST 47 (H) 15 - 41 U/L   ALT 102 (H) 0 - 44 U/L   Alkaline Phosphatase 364 (H) 38 - 126 U/L   Total Bilirubin 1.2 0.3 - 1.2 mg/dL   GFR calc non Af Amer 17 (L) >60 mL/min   GFR calc Af Amer 20 (L) >60 mL/min   Anion gap 26 (H) 5 - 15    Comment: Performed at [facility], [address] [place], [place] [address]  Troponin I - Once     Status: None   Collection Time: 04/14/19  4:16 AM  Result Value Ref Range   Troponin I <0.03 <0.03 ng/mL    Comment: Performed at [facility], [address] [place], [place] [address]  Lactic acid, plasma     Status: Abnormal   Collection Time: 04/14/19  4:16 AM  Result Value Ref Range   Lactic Acid, Venous 3.9 (HH) 0.5 - 1.9 mmol/L    Comment: CRITICAL RESULT CALLED TO, READ BACK BY AND VERIFIED WITH BRITTNEY Asani Mcburney AT 0456 04/14/2019 SMA Performed at [facility], [address] [place], [place] [address]   Glucose, capillary     Status: Abnormal   Collection Time: 04/14/19  4:18 AM  Result Value Ref Range   Glucose-Capillary >600 (HH) 70 - 99 mg/dL  Blood gas, venous     Status: Abnormal   Collection Time: 04/14/19  4:39 AM  Result Value Ref Range   pH, Ven 7.28 7.250 - 7.430   pCO2, Ven 35 (L) 44.0 - 60.0 mmHg   pO2, Ven 48.0 (H) 32.0 - 45.0 mmHg   Bicarbonate 16.4 (L) 20.0 - 28.0 mmol/L   Acid-base deficit 9.4 (H) 0.0 - 2.0 mmol/L   O2 Saturation 77.4 %   Patient temperature 37.0    Collection site VEIN    Sample type VENOUS     Comment: Performed at [facility], [address], [place], [place] [address]   Dg Chest Port 1 View  Result Date: 04/14/2019 CLINICAL DATA:  58 year old female with fever and vomiting. EXAM: PORTABLE CHEST 1 VIEW COMPARISON:  12/09/2018. FINDINGS: Portable AP upright view at 0451 hours. Normal lung volumes and mediastinal contours. Allowing for portable technique the lungs are clear. Visualized tracheal air column  is within  normal limits. No pneumothorax. Paucity of bowel gas in the upper abdomen. No acute osseous abnormality identified. IMPRESSION: Negative portable chest. Electronically Signed   By: Genevie Ann M.D.   On: 04/14/2019 05:24    Pending Labs Unresulted Labs (From admission, onward)    Start     Ordered   04/14/19 0542  Hemoglobin A1c  Once,   STAT     04/14/19 0542   04/14/19 0542  CBC with Differential  Once,   STAT     04/14/19 0542   04/14/19 0542  Culture, blood (routine x 2)  BLOOD CULTURE X 2,   STAT     04/14/19 0542   04/14/19 0542  Urinalysis, Routine w reflex microscopic  Once,   STAT     04/14/19 0542   04/14/19 3559  Basic metabolic panel  STAT Now then every 4 hours ,   STAT     04/14/19 0542   04/14/19 0500  Beta-hydroxybutyric acid  Add-on,   AD     04/14/19 0459   04/14/19 0412  Lactic acid, plasma  Now then every 2 hours,   STAT     04/14/19 0411   04/14/19 0412  Urinalysis, Complete w Microscopic  ONCE - STAT,   STAT     04/14/19 0411   04/14/19 0412  Urine culture  Once,   STAT     04/14/19 0411   04/14/19 0411  Culture, blood (routine x 2)  BLOOD CULTURE X 2,   STAT     04/14/19 0411          Vitals/Pain Today's Vitals   04/14/19 0410 04/14/19 0413 04/14/19 0430 04/14/19 0504  BP:  (!) 117/49 (!) 117/55 (!) 109/45  Pulse:  95    Resp:  18 18 18   Temp:  98.7 F (37.1 C)    TempSrc:  Oral    SpO2:  100%    Weight: 68 kg     Height: 5' 6"  (1.676 m)     PainSc: 0-No pain       Isolation Precautions No active isolations  Medications Medications  dextrose 5 %-0.45 % sodium chloride infusion (has no administration in time range)  metroNIDAZOLE (FLAGYL) IVPB 500 mg (500 mg Intravenous New Bag/Given 04/14/19 0521)  vancomycin (VANCOCIN) 1,750 mg in sodium chloride 0.9 % 500 mL IVPB (1,750 mg Intravenous New Bag/Given 04/14/19 0554)  aspirin EC tablet 81 mg (has no administration in time range)  atorvastatin (LIPITOR) tablet 80 mg (has no administration in time  range)  metoprolol tartrate (LOPRESSOR) tablet 25 mg (has no administration in time range)  0.9 %  sodium chloride infusion (has no administration in time range)  0.9 %  sodium chloride infusion (has no administration in time range)  dextrose 5 %-0.45 % sodium chloride infusion (has no administration in time range)  insulin regular, human (MYXREDLIN) 100 units/ 100 mL infusion (has no administration in time range)  enoxaparin (LOVENOX) injection 40 mg (has no administration in time range)  acetaminophen (TYLENOL) tablet 650 mg (has no administration in time range)  alum & mag hydroxide-simeth (MAALOX/MYLANTA) 200-200-20 MG/5ML suspension 30 mL (has no administration in time range)  traZODone (DESYREL) tablet 25 mg (has no administration in time range)  magnesium hydroxide (MILK OF MAGNESIA) suspension 30 mL (has no administration in time range)  sodium chloride 0.9 % bolus 1,000 mL (0 mLs Intravenous Stopped 04/14/19 0557)  ceFEPIme (MAXIPIME) 2 g in sodium chloride 0.9 %  100 mL IVPB (0 g Intravenous Stopped 04/14/19 0550)  insulin aspart (novoLOG) injection 10 Units (10 Units Intravenous Given 04/14/19 0518)  sodium bicarbonate injection 50 mEq (50 mEq Intravenous Given 04/14/19 0552)    Mobility walks with person assist Low fall risk   Focused Assessments   R Recommendations: See Admitting Provider Note  Report given to:   Additional Notes:

## 2019-04-15 ENCOUNTER — Encounter: Payer: Self-pay | Admitting: *Deleted

## 2019-04-15 LAB — BASIC METABOLIC PANEL
Anion gap: 12 (ref 5–15)
Anion gap: 9 (ref 5–15)
BUN: 65 mg/dL — ABNORMAL HIGH (ref 6–20)
BUN: 66 mg/dL — ABNORMAL HIGH (ref 6–20)
CO2: 23 mmol/L (ref 22–32)
CO2: 22 mmol/L (ref 22–32)
Calcium: 8.9 mg/dL (ref 8.9–10.3)
Calcium: 8.8 mg/dL — ABNORMAL LOW (ref 8.9–10.3)
Chloride: 104 mmol/L (ref 98–111)
Chloride: 107 mmol/L (ref 98–111)
Creatinine, Ser: 2.04 mg/dL — ABNORMAL HIGH (ref 0.44–1.00)
Creatinine, Ser: 2.21 mg/dL — ABNORMAL HIGH (ref 0.44–1.00)
GFR calc Af Amer: 30 mL/min — ABNORMAL LOW (ref >60)
GFR calc Af Amer: 27 mL/min — ABNORMAL LOW (ref >60)
GFR calc non Af Amer: 26 mL/min — ABNORMAL LOW (ref >60)
GFR calc non Af Amer: 24 mL/min — ABNORMAL LOW (ref >60)
Glucose, Bld: 348 mg/dL — ABNORMAL HIGH (ref 70–99)
Glucose, Bld: 242 mg/dL — ABNORMAL HIGH (ref 70–99)
Potassium: 4.9 mmol/L (ref 3.5–5.1)
Potassium: 4.8 mmol/L (ref 3.5–5.1)
Sodium: 139 mmol/L (ref 135–145)
Sodium: 138 mmol/L (ref 135–145)

## 2019-04-15 LAB — CBC WITH DIFFERENTIAL/PLATELET
Abs Immature Granulocytes: 0.04 10*3/uL (ref 0.00–0.07)
Basophils Absolute: 0 10*3/uL (ref 0.0–0.1)
Basophils Relative: 0 %
Eosinophils Absolute: 0.3 10*3/uL (ref 0.0–0.5)
Eosinophils Relative: 2 %
HCT: 32.7 % — ABNORMAL LOW (ref 36.0–46.0)
Hemoglobin: 11 g/dL — ABNORMAL LOW (ref 12.0–15.0)
Immature Granulocytes: 0 %
Lymphocytes Relative: 6 %
Lymphs Abs: 0.9 10*3/uL (ref 0.7–4.0)
MCH: 29.9 pg (ref 26.0–34.0)
MCHC: 33.6 g/dL (ref 30.0–36.0)
MCV: 88.9 fL (ref 80.0–100.0)
Monocytes Absolute: 0.9 10*3/uL (ref 0.1–1.0)
Monocytes Relative: 5 %
Neutro Abs: 13.7 10*3/uL — ABNORMAL HIGH (ref 1.7–7.7)
Neutrophils Relative %: 87 %
Platelets: 103 10*3/uL — ABNORMAL LOW (ref 150–400)
RBC: 3.68 MIL/uL — ABNORMAL LOW (ref 3.87–5.11)
RDW: 12.3 % (ref 11.5–15.5)
Smear Review: NORMAL
WBC: 15.8 10*3/uL — ABNORMAL HIGH (ref 4.0–10.5)
nRBC: 0 % (ref 0.0–0.2)

## 2019-04-15 LAB — GLUCOSE, CAPILLARY
Glucose-Capillary: 292 mg/dL — ABNORMAL HIGH (ref 70–99)
Glucose-Capillary: 297 mg/dL — ABNORMAL HIGH (ref 70–99)
Glucose-Capillary: 240 mg/dL — ABNORMAL HIGH (ref 70–99)
Glucose-Capillary: 131 mg/dL — ABNORMAL HIGH (ref 70–99)
Glucose-Capillary: 66 mg/dL — ABNORMAL LOW (ref 70–99)
Glucose-Capillary: 146 mg/dL — ABNORMAL HIGH (ref 70–99)
Glucose-Capillary: 290 mg/dL — ABNORMAL HIGH (ref 70–99)

## 2019-04-15 LAB — HEPATIC FUNCTION PANEL
ALT: 76 U/L — ABNORMAL HIGH (ref 0–44)
AST: 31 U/L (ref 15–41)
Albumin: 3.2 g/dL — ABNORMAL LOW (ref 3.5–5.0)
Alkaline Phosphatase: 228 U/L — ABNORMAL HIGH (ref 38–126)
Bilirubin, Direct: 0.2 mg/dL (ref 0.0–0.2)
Indirect Bilirubin: 0.6 mg/dL (ref 0.3–0.9)
Total Bilirubin: 0.8 mg/dL (ref 0.3–1.2)
Total Protein: 5.7 g/dL — ABNORMAL LOW (ref 6.5–8.1)

## 2019-04-15 LAB — PHOSPHORUS: Phosphorus: 2.2 mg/dL — ABNORMAL LOW (ref 2.5–4.6)

## 2019-04-15 LAB — HEMOGLOBIN A1C
Hgb A1c MFr Bld: 12.5 % — ABNORMAL HIGH (ref 4.8–5.6)
Mean Plasma Glucose: 312.05 mg/dL

## 2019-04-15 LAB — MRSA PCR SCREENING: MRSA by PCR: NEGATIVE

## 2019-04-15 LAB — MAGNESIUM: Magnesium: 2 mg/dL (ref 1.7–2.4)

## 2019-04-15 MED ORDER — POTASSIUM & SODIUM PHOSPHATES 280-160-250 MG PO PACK
2.0000 | PACK | Freq: Once | ORAL | Status: AC
  Administered 2019-04-15: 2 via ORAL
  Filled 2019-04-15: qty 2

## 2019-04-15 MED ORDER — INSULIN GLARGINE 100 UNIT/ML ~~LOC~~ SOLN
15.0000 [IU] | SUBCUTANEOUS | Status: DC
  Administered 2019-04-15: 15 [IU] via SUBCUTANEOUS
  Filled 2019-04-15 (×2): qty 0.15

## 2019-04-15 MED ORDER — AMOXICILLIN 500 MG PO CAPS
500.0000 mg | ORAL_CAPSULE | Freq: Two times a day (BID) | ORAL | Status: DC
  Administered 2019-04-15 – 2019-04-16 (×3): 500 mg via ORAL
  Filled 2019-04-15 (×4): qty 1

## 2019-04-15 MED ORDER — INSULIN ASPART 100 UNIT/ML ~~LOC~~ SOLN
10.0000 [IU] | Freq: Once | SUBCUTANEOUS | Status: AC
  Administered 2019-04-15: 10 [IU] via SUBCUTANEOUS
  Filled 2019-04-15: qty 1

## 2019-04-15 MED ORDER — INSULIN ASPART 100 UNIT/ML ~~LOC~~ SOLN
3.0000 [IU] | Freq: Three times a day (TID) | SUBCUTANEOUS | Status: DC
  Administered 2019-04-15 – 2019-04-16 (×3): 3 [IU] via SUBCUTANEOUS
  Filled 2019-04-15 (×3): qty 1

## 2019-04-15 MED ORDER — INSULIN GLARGINE 100 UNIT/ML ~~LOC~~ SOLN
5.0000 [IU] | Freq: Once | SUBCUTANEOUS | Status: AC
  Administered 2019-04-15: 5 [IU] via SUBCUTANEOUS
  Filled 2019-04-15: qty 0.05

## 2019-04-15 NOTE — Progress Notes (Addendum)
Inpatient Diabetes Program Recommendations  AACE/ADA: New Consensus Statement on Inpatient Glycemic Control (2015)  Target Ranges:  Prepandial:   less than 140 mg/dL      Peak postprandial:   less than 180 mg/dL (1-2 hours)      Critically ill patients:  140 - 180 mg/dL   Lab Results  Component Value Date   GLUCAP 240 (H) 04/15/2019   HGBA1C 12.5 (H) 04/15/2019    Review of Glycemic Control Results for Susan Navarro, Susan Navarro (MRN [id number]) as of 04/15/2019 09:12  Ref. Range 04/14/2019 19:43 04/14/2019 21:11 04/14/2019 22:47 04/15/2019 03:49 04/15/2019 03:51 04/15/2019 07:43  Glucose-Capillary Latest Ref Range: 70 - 99 mg/dL 109 (H) 270 (H) 305 (H) 297 (H) 292 (H) 240 (H)  Diabetes history: DM Outpatient Diabetes medications:  Lantus 15 units daily, Humalog 5 units tid with meals Current orders for Inpatient glycemic control:  Novolog sensitive tid with meals and HS, Lantus 15 units q HS  Inpatient Diabetes Program Recommendations:    A1C indicates poor control of DM prior to admit (average 312 mg/dL). This is up from last admission.  Will attempt to speak with patient by phone today regarding glycemic control and potential needs.    -MD consider adding Novolog 3 units tid with meals (hold if patient eats less than 50%).   Thanks,  Adah Perl, RN, BC-ADM Inpatient Diabetes Coordinator Pager [phone] (8a-5p)   12:45- Spoke with patient by phone.  We discussed her DKA admission.  She states that she never misses her insulin but she has not been checking her blood sugars.  She states that she has insulin has home.  She uses insulin pens.  After further discussion, she states that she does not dispose of insulin after 30 days but keeps using it.  Encouraged patient to only use one pen of each type of insulin at a time and to mark "open date" to ensure that the insulin does not go bad.  Patient states that sometimes she has duplicate pens and is unsure of the date opened.  We discussed A1C of  12.5% which is much worse from last admit.  I encouraged her to check her blood sugars at least 3 times a day. We discussed normal blood glucose levels as well.  She states when she was checking her blood sugars, she was usually in the 180's.  Patient seems to understand importance of taking her insulin, however does not base insulin doses on blood sugars.  Needs f/u with MD when possible as it appears she missed her appointment in March of 2020.  Patient verbalized understanding, seems aware of importance.

## 2019-04-15 NOTE — Progress Notes (Signed)
[facility] [place] NAME: Darleen Moffitt    MR#:  [id number]  DATE OF BIRTH:  [birth year]  SUBJECTIVE:  CHIEF COMPLAINT:   Chief Complaint  Patient presents with  . Hyperglycemia   No new complaints.  No fevers.  No nausea vomiting this morning. Patient already weaned off insulin drip last night and started on subcutaneous insulin.  REVIEW OF SYSTEMS:  Review of Systems  Constitutional: Negative for chills and fever.  HENT: Negative for hearing loss and tinnitus.   Eyes: Negative for blurred vision and double vision.  Respiratory: Negative for cough, hemoptysis and shortness of breath.   Cardiovascular: Negative for chest pain and palpitations.  Gastrointestinal: Negative for abdominal pain.       Nausea and vomiting resolved.  Genitourinary: Negative for dysuria and urgency.  Musculoskeletal: Negative for myalgias and neck pain.  Skin: Negative for itching and rash.  Neurological: Negative for dizziness and headaches.  Psychiatric/Behavioral: Negative for depression and hallucinations.    DRUG ALLERGIES:  No Known Allergies VITALS:  Blood pressure 118/65, pulse 94, temperature 98.6 F (37 C), temperature source Oral, resp. rate 19, height 5' 6"  (1.676 m), weight 68 kg, SpO2 100 %. PHYSICAL EXAMINATION:   Physical Exam  Constitutional: She is oriented to person, place, and time. She appears well-developed.  HENT:  Head: Normocephalic and atraumatic.  Right Ear: External ear normal.  Eyes: Pupils are equal, round, and reactive to light. Conjunctivae are normal. Right eye exhibits no discharge.  Neck: Normal range of motion. Neck supple. No thyromegaly present.  Cardiovascular: Normal rate, regular rhythm and normal heart sounds.  Respiratory: Effort normal. No respiratory distress.  GI: Soft. Bowel sounds are normal. She exhibits no distension.  Musculoskeletal: Normal range of motion.        General: No edema.   Neurological: She is alert and oriented to person, place, and time.  Skin: Skin is warm. She is not diaphoretic. No erythema.  Psychiatric: She has a normal mood and affect. Her behavior is normal.   LABORATORY PANEL:  Female CBC Recent Labs  Lab 04/15/19 0428  WBC 15.8*  HGB 11.0*  HCT 32.7*  PLT 103*   ------------------------------------------------------------------------------------------------------------------ Chemistries  Recent Labs  Lab 04/15/19 0428 04/15/19 0855  NA 139 138  K 4.9 4.8  CL 104 107  CO2 23 22  GLUCOSE 348* 242*  BUN 65* 66*  CREATININE 2.04* 2.21*  CALCIUM 8.9 8.8*  MG 2.0  --   AST 31  --   ALT 76*  --   ALKPHOS 228*  --   BILITOT 0.8  --    RADIOLOGY:  No results found. ASSESSMENT AND PLAN:   #1.  DKA with uncontrolled type 1 diabetes mellitus. Patient admitted to the ICU and started on insulin drip with IV fluids.   Anion gap was 26.  DKA resolved with anion gap down to normal.  Patient already weaned off insulin drip last night.  Started on Lantus insulin 15 units daily.  Appreciate input from diabetic nurse educator.  Added NovoLog 3 units 3 times daily with meals Glycosylated hemoglobin level of 12.5 suggestive of poorly controlled diabetes. Monitor blood sugars and adjust regimen as needed.  2.  Hyperkalemia.  Resolved with insulin drip.    3.  Acute kidney injury on top of stage III chronic kidney disease.   Renal function overall fairly stable when compared to previously.  Follow-up BMP in a.m.   4.  Urinary tract infection Urine culture growing Enterococcus faecalis.  Sensitivities still pending. Was initially on IV vancomycin and cefepime which was previously discontinued because elevated lactic acid level was felt to be due to dehydration.  This trended down with IV fluids. Resumed vancomycin with pharmacy to dose pending results of sensitivities.   Follow-up CBC in a.m.  COVID test negative  5.  Hypertension.  Her  blood pressure had been borderline.  Her antihypertensives will be placed with conservative holding parameters.  6.  History of CVA.  We will continue her aspirin and statin therapy.  7.  Dyslipidemia.  Statin therapy will be resumed.  8.    Hyponatremia  Secondary to hyperglycemia .  Resolved with correction of hyperglycemia   DVT prophylaxis.  Subcutaneous heparin.  Disposition; patient to be transferred out of ICU today once bed available  All the records are reviewed and case discussed with Care Management/Social Worker. Management plans discussed with the patient, family and they are in agreement.  CODE STATUS: Full Code  TOTAL TIME TAKING CARE OF THIS PATIENT: 36 minutes.   More than 50% of the time was spent in counseling/coordination of care: YES  POSSIBLE D/C IN 2 DAYS, DEPENDING ON CLINICAL CONDITION.   Tawanda Schall M.D on 04/15/2019 at 11:42 AM  Between 7am to 6pm - Pager - [phone]  After 6pm go to [email or web address] - password [email or web address] [facility]  [employer] [place] [facility]  Office  [phone]  CC: Primary care physician; Rochel Brome, MD  Note: This dictation was prepared with Dragon dictation along with smaller phrase technology. Any transcriptional errors that result from this process are unintentional.

## 2019-04-15 NOTE — Progress Notes (Signed)
Pt has remained alert and oriented with no c/o pain. Pt has remained on RA. SpO2 > 95%. Lung sounds clear to auscultation. Pt has remained in NSR on cardiac monitor. BP soft/approporiate-> d/c PO 42m metop; HR WNL. Insulin gtt turned off yesterday-cbgs approporiate-3U meal coverage added to insulin management. Pt with orders to transfer to floor.

## 2019-04-15 NOTE — Progress Notes (Addendum)
Pharmacy Electrolyte Monitoring Consult:  Pharmacy consulted to assist in monitoring and replacing electrolytes in this 58 y.o. female admitted on 04/14/2019 with Hyperglycemia   Labs:  Sodium (mmol/L)  Date Value  04/15/2019 139   Potassium (mmol/L)  Date Value  04/15/2019 4.9   Magnesium (mg/dL)  Date Value  04/15/2019 2.0   Phosphorus (mg/dL)  Date Value  04/15/2019 2.2 (L)   Calcium (mg/dL)  Date Value  04/15/2019 8.9   Albumin (g/dL)  Date Value  04/15/2019 3.2 (L)    Assessment/Plan: Patient receiving insulin infusion for DKA. MIVF transitioned to NS at 75 this am.   Will replace w/ Phos-Nak 2 packets PO x 1 and will recheck electrolytes w/ am labs. K, Mag WNL.  Pharmacy will continue to monitor and adjust per consult.   Tobie Lords, PharmD, BCPS Clinical Pharmacist 04/15/2019

## 2019-04-16 LAB — CBC WITH DIFFERENTIAL/PLATELET
Abs Immature Granulocytes: 0.02 10*3/uL (ref 0.00–0.07)
Basophils Absolute: 0 10*3/uL (ref 0.0–0.1)
Basophils Relative: 0 %
Eosinophils Absolute: 0 10*3/uL (ref 0.0–0.5)
Eosinophils Relative: 1 %
HCT: 29.3 % — ABNORMAL LOW (ref 36.0–46.0)
Hemoglobin: 9.6 g/dL — ABNORMAL LOW (ref 12.0–15.0)
Immature Granulocytes: 0 %
Lymphocytes Relative: 19 %
Lymphs Abs: 0.9 10*3/uL (ref 0.7–4.0)
MCH: 30 pg (ref 26.0–34.0)
MCHC: 32.8 g/dL (ref 30.0–36.0)
MCV: 91.6 fL (ref 80.0–100.0)
Monocytes Absolute: 0.3 10*3/uL (ref 0.1–1.0)
Monocytes Relative: 7 %
Neutro Abs: 3.4 10*3/uL (ref 1.7–7.7)
Neutrophils Relative %: 73 %
Platelets: 68 10*3/uL — ABNORMAL LOW (ref 150–400)
RBC: 3.2 MIL/uL — ABNORMAL LOW (ref 3.87–5.11)
RDW: 12.4 % (ref 11.5–15.5)
WBC: 4.7 10*3/uL (ref 4.0–10.5)
nRBC: 0 % (ref 0.0–0.2)

## 2019-04-16 LAB — RENAL FUNCTION PANEL
Albumin: 3.1 g/dL — ABNORMAL LOW (ref 3.5–5.0)
Anion gap: 6 (ref 5–15)
BUN: 60 mg/dL — ABNORMAL HIGH (ref 6–20)
CO2: 25 mmol/L (ref 22–32)
Calcium: 8.8 mg/dL — ABNORMAL LOW (ref 8.9–10.3)
Chloride: 105 mmol/L (ref 98–111)
Creatinine, Ser: 2 mg/dL — ABNORMAL HIGH (ref 0.44–1.00)
GFR calc Af Amer: 31 mL/min — ABNORMAL LOW (ref >60)
GFR calc non Af Amer: 27 mL/min — ABNORMAL LOW (ref >60)
Glucose, Bld: 246 mg/dL — ABNORMAL HIGH (ref 70–99)
Phosphorus: 2.1 mg/dL — ABNORMAL LOW (ref 2.5–4.6)
Potassium: 4.3 mmol/L (ref 3.5–5.1)
Sodium: 136 mmol/L (ref 135–145)

## 2019-04-16 LAB — URINE CULTURE: Culture: 50000 — AB

## 2019-04-16 LAB — MAGNESIUM: Magnesium: 2.1 mg/dL (ref 1.7–2.4)

## 2019-04-16 LAB — GLUCOSE, CAPILLARY
Glucose-Capillary: 200 mg/dL — ABNORMAL HIGH (ref 70–99)
Glucose-Capillary: 148 mg/dL — ABNORMAL HIGH (ref 70–99)

## 2019-04-16 MED ORDER — AMOXICILLIN 500 MG PO CAPS: 500.0000 mg | ORAL_CAPSULE | Freq: Two times a day (BID) | ORAL | 0 refills | Status: AC

## 2019-04-16 NOTE — TOC Initial Note (Signed)
Transition of Care [facility]) - Initial/Assessment Note    Patient Details  Name: Susan Navarro MRN: [id number] Date of Birth: [birth year]  Transition of Care [facility] CM/SW Contact:    Shelbie Hutching, RN Phone Number: 04/16/2019, 2:25 PM  Clinical Narrative:                 Patient is from home and lives with her daughter, patient is ready for discharge today.  Patient reports that her boyfriend is picking her up.  PT recommends [facility] PT and patient agrees. [facility]  Melissa with [facility] notified of referral.  Patient is current with her PCP who is Donley Redder at [facility].    Expected Discharge Plan: [place] Barriers to Discharge: Barriers Resolved   Patient Goals and CMS Choice Patient states their goals for this hospitalization and ongoing recovery are:: Home with home health CMS Medicare.gov Compare Post Acute Care list provided to:: Patient Choice offered to / list presented to : Patient  Expected Discharge Plan and Services Expected Discharge Plan: [place]   Discharge Planning Services: CM Consult Post Acute Care Choice: [place] arrangements for the past 2 months: Single Family Home Expected Discharge Date: 04/16/19                         HH Arranged: PT HH Agency: [facility] ([place]) Date HH Agency Contacted: 04/16/19 Time [place]: 17 Representative spoke with at [place]: [place] Arrangements/Services Living arrangements for the past 2 months: [place] Lives with:: Adult Children Patient language and need for interpreter reviewed:: Yes Do you feel safe going back to the place where you live?: Yes      Need for Family Participation in Patient Care: Yes (Comment) Care giver support system in place?: Yes (comment)(has a boyfriend and daughter)   Criminal Activity/Legal Involvement Pertinent to Current Situation/Hospitalization: No - Comment as  needed  Activities of Daily Living Home Assistive Devices/Equipment: None ADL Screening (condition at time of admission) Patient's cognitive ability adequate to safely complete daily activities?: Yes Is the patient deaf or have difficulty hearing?: No Does the patient have difficulty seeing, even when wearing glasses/contacts?: No Does the patient have difficulty concentrating, remembering, or making decisions?: No Patient able to express need for assistance with ADLs?: Yes Does the patient have difficulty dressing or bathing?: No Independently performs ADLs?: Yes (appropriate for developmental age) Does the patient have difficulty walking or climbing stairs?: No Weakness of Legs: None Weakness of Arms/Hands: None  Permission Sought/Granted Permission sought to share information with : Other (comment) Permission granted to share information with : Yes, Verbal Permission Granted     Permission granted to share info w AGENCY: [facility]        Emotional Assessment   Attitude/Demeanor/Rapport: Engaged Affect (typically observed): Accepting Orientation: : Oriented to Self, Oriented to Place, Oriented to  Time, Oriented to Situation Alcohol / Substance Use: Not Applicable Psych Involvement: No (comment)  Admission diagnosis:  Hyperkalemia [E87.5] Hyponatremia [E87.1] Hyperglycemia [R73.9] AKI (acute kidney injury) ([place]) [N17.9] Diabetic ketoacidosis without coma associated with type 1 diabetes mellitus ([place]) [E10.10] Patient Active Problem List   Diagnosis Date Noted  . DKA (diabetic ketoacidoses) ([place]) 12/09/2018   PCP:  Rochel Brome, MD Pharmacy:   [facility], [place] - [address] [place] [address] Phone: [phone] Fax: [phone]     Social Determinants of Health (SDOH) Interventions    Readmission Risk Interventions No flowsheet data found.

## 2019-04-16 NOTE — Discharge Summary (Signed)
[place] [facility] [place] NAME: Susan Navarro    MR#:  [id number]  DATE OF BIRTH:  February 19, 1961  DATE OF ADMISSION:  04/14/2019 ADMITTING PHYSICIAN: Christel Mormon, MD  DATE OF DISCHARGE: 04/16/2019   PRIMARY CARE PHYSICIAN: Rochel Brome, MD    ADMISSION DIAGNOSIS:  Hyperkalemia [E87.5] Hyponatremia [E87.1] Hyperglycemia [R73.9] AKI (acute kidney injury) ([place]) [N17.9] Diabetic ketoacidosis without coma associated with type 1 diabetes mellitus ([place]) [E10.10]  DISCHARGE DIAGNOSIS:  Active Problems:   DKA (diabetic ketoacidoses) ([place])   SECONDARY DIAGNOSIS:   Past Medical History:  Diagnosis Date  . Anemia   . Chronic kidney disease   . Diabetes mellitus without complication ([place])   . Hypertension   . Stroke [facility]) 03/2018   Ischemic stroke per [facility] COURSE:   1. DKA with uncontrolled type 1 diabetes mellitus. Was in ICU and started on insulin drip with IV fluids.   Anion gap was 26.  DKA resolved with anion gap down to normal.  Patient already weaned off insulin drip last night. Started on Lantus insulin 15 units daily.  Appreciate input from diabetic nurse educator.  Added NovoLog 3 units 3 times daily with meals Glycosylated hemoglobin level of 12.5 suggestive of poorly controlled diabetes. Advised on taking diabetic medications regularly.  2. Hyperkalemia. Resolved with insulin drip.    3. Acute kidney injury on top of stage III chronic kidney disease.  Renal function overall fairly stable when compared to previously.  Follow-up BMP in a.m.   4.  Urinary tract infection Urine culture growing Enterococcus faecalis.  Sensitivities reviewed and changed to amoxicillin.  COVID test negative  5. Hypertension.Her blood pressure had been borderline. Her antihypertensives will be placed with conservative holding parameters.  6. History of CVA.We will continue her aspirin and statin  therapy.  7. Dyslipidemia.Statin therapy will be resumed.  8.   Hyponatremia  Secondary to hyperglycemia .  Resolved with correction of hyperglycemia    DISCHARGE CONDITIONS:   Stable.  CONSULTS OBTAINED:    DRUG ALLERGIES:  No Known Allergies  DISCHARGE MEDICATIONS:   Allergies as of 04/16/2019   No Known Allergies     Medication List    TAKE these medications   acetaminophen 325 MG tablet Commonly known as:  TYLENOL Take 325 mg by mouth every 4 (four) hours as needed.   amoxicillin 500 MG capsule Commonly known as:  AMOXIL Take 1 capsule (500 mg total) by mouth every 12 (twelve) hours for 5 days.   aspirin EC 81 MG tablet Take 81 mg by mouth daily.   atorvastatin 80 MG tablet Commonly known as:  LIPITOR Take 80 mg by mouth every evening.   HumaLOG KwikPen 100 UNIT/ML KwikPen Generic drug:  insulin lispro Inject 5 Units into the skin 3 (three) times daily before meals.   Lantus SoloStar 100 UNIT/ML Solostar Pen Generic drug:  Insulin Glargine Inject 15 Units into the skin daily.   metoprolol tartrate 25 MG tablet Commonly known as:  LOPRESSOR Take 25 mg by mouth daily.        DISCHARGE INSTRUCTIONS:   Follow with primary care physician in 1 to 2 weeks.  If you experience worsening of your admission symptoms, develop shortness of breath, life threatening emergency, suicidal or homicidal thoughts you must seek medical attention immediately by calling 911 or calling your MD immediately  if symptoms less severe.  You Must read complete instructions/literature along with all  the possible adverse reactions/side effects for all the Medicines you take and that have been prescribed to you. Take any new Medicines after you have completely understood and accept all the possible adverse reactions/side effects.   Please note  You were cared for by a hospitalist during your hospital stay. If you have any questions about your discharge medications or the  care you received while you were in the hospital after you are discharged, you can call the unit and asked to speak with the hospitalist on call if the hospitalist that took care of you is not available. Once you are discharged, your primary care physician will handle any further medical issues. Please note that NO REFILLS for any discharge medications will be authorized once you are discharged, as it is imperative that you return to your primary care physician (or establish a relationship with a primary care physician if you do not have one) for your aftercare needs so that they can reassess your need for medications and monitor your lab values.    Today   CHIEF COMPLAINT:   Chief Complaint  Patient presents with  . Hyperglycemia    HISTORY OF PRESENT ILLNESS:  Susan Navarro  is a 58 y.o. female with a known history of type 2 diabetes mellitus, hypertension dyslipidemia, and multiple medical problems that will be mentioned below who presented to the emergency room with acute onset of recurrent vomiting since yesterday morning.  She stated that she vomited twice.  No bilious vomitus or hematemesis.  She denied any diarrhea.  There was a questionable tactile fever however she did not check her temperature and did not have any chills.  No cough or wheezing or dyspnea or chest pain.  No recent sick exposure.  She has not been traveling out of state and has not been exposed to large crowds..  She admits to polyuria and polydipsia.  No dysuria or hematuria or flank pain.  She missed her Lantus last night and her short acting insulin yesterday.  Upon presentation to the emergency room, vital signs were within normal.  Labs were remarkable for hyponatremia and hyper per kalemia as below and significantly elevated blood glucose of 881.  Urinalysis currently pending and her CBC showed WBC of 11.9 with chronic anemia.  Her lactic acid was elevated at 3.9.  She had 2 blood cultures drawn.  She was given IV  cefepime and vancomycin as well as Flagyl.  She was also given 10 units of IV NovoLog and was started on IV insulin drip.  For hyperkalemia she was ordered IV calcium gluconate 30 g of p.o. Kayexalate and an amp of sodium bicarbonate.  Her venous blood gas did show a pH of 7.28 with bicarbonate of 16.4.Her chest x-ray showed no acute cardiopulmonary disease.  EKG showed normal sinus rhythm with rate of 95.  She will be admitted to a stepdown unit for further evaluation and management.   VITAL SIGNS:  Blood pressure (!) 136/57, pulse 97, temperature 98.1 F (36.7 C), temperature source Oral, resp. rate 18, height 5' 6"  (1.676 m), weight 68 kg, SpO2 100 %.  I/O:    Intake/Output Summary (Last 24 hours) at 04/16/2019 1408 Last data filed at 04/16/2019 0900 Gross per 24 hour  Intake 240 ml  Output 800 ml  Net -560 ml    PHYSICAL EXAMINATION:  GENERAL:  58 y.o.-year-old patient lying in the bed with no acute distress.  EYES: Pupils equal, round, reactive to light and accommodation. No scleral icterus.  Extraocular muscles intact.  HEENT: Head atraumatic, normocephalic. Oropharynx and nasopharynx clear.  NECK:  Supple, no jugular venous distention. No thyroid enlargement, no tenderness.  LUNGS: Normal breath sounds bilaterally, no wheezing, rales,rhonchi or crepitation. No use of accessory muscles of respiration.  CARDIOVASCULAR: S1, S2 normal. No murmurs, rubs, or gallops.  ABDOMEN: Soft, non-tender, non-distended. Bowel sounds present. No organomegaly or mass.  EXTREMITIES: No pedal edema, cyanosis, or clubbing.  NEUROLOGIC: Cranial nerves II through XII are intact. Muscle strength 5/5 in all extremities. Sensation intact. Gait not checked.  PSYCHIATRIC: The patient is alert and oriented x 3.  SKIN: No obvious rash, lesion, or ulcer.   DATA REVIEW:   CBC Recent Labs  Lab 04/16/19 0435  WBC 4.7  HGB 9.6*  HCT 29.3*  PLT 68*    Chemistries  Recent Labs  Lab 04/15/19 0428   04/16/19 0435  NA 139   < > 136  K 4.9   < > 4.3  CL 104   < > 105  CO2 23   < > 25  GLUCOSE 348*   < > 246*  BUN 65*   < > 60*  CREATININE 2.04*   < > 2.00*  CALCIUM 8.9   < > 8.8*  MG 2.0  --  2.1  AST 31  --   --   ALT 76*  --   --   ALKPHOS 228*  --   --   BILITOT 0.8  --   --    < > = values in this interval not displayed.    Cardiac Enzymes Recent Labs  Lab 04/14/19 0416  TROPONINI <0.03    Microbiology Results  Results for orders placed or performed during the hospital encounter of 04/14/19  Culture, blood (routine x 2)     Status: None (Preliminary result)   Collection Time: 04/14/19  4:16 AM  Result Value Ref Range Status   Specimen Description BLOOD LEFT ANTECUBITAL  Final   Special Requests   Final    BOTTLES DRAWN AEROBIC AND ANAEROBIC Blood Culture adequate volume   Culture   Final    NO GROWTH 2 DAYS Performed at [facility], [address], [place], [place] [address]    Report Status PENDING  Incomplete  Urine culture     Status: Abnormal   Collection Time: 04/14/19  4:16 AM  Result Value Ref Range Status   Specimen Description   Final    URINE, RANDOM Performed at [facility], [address] [place], [place] [address]    Special Requests   Final    NONE Performed at [facility], [address] [place], [place] [address]    Culture 50,000 COLONIES/mL ENTEROCOCCUS FAECALIS (A)  Final   Report Status 04/16/2019 FINAL  Final   Organism ID, Bacteria ENTEROCOCCUS FAECALIS (A)  Final      Susceptibility   Enterococcus faecalis - MIC*    AMPICILLIN <=2 SENSITIVE Sensitive     LEVOFLOXACIN 0.5 SENSITIVE Sensitive     NITROFURANTOIN <=16 SENSITIVE Sensitive     VANCOMYCIN 1 SENSITIVE Sensitive     * 50,000 COLONIES/mL ENTEROCOCCUS FAECALIS  Culture, blood (routine x 2)     Status: None (Preliminary result)   Collection Time: 04/14/19  4:20 AM  Result Value Ref Range Status   Specimen Description BLOOD BLOOD RIGHT  FOREARM  Final   Special Requests   Final    BOTTLES DRAWN AEROBIC AND ANAEROBIC Blood Culture adequate volume  Culture   Final    NO GROWTH 2 DAYS Performed at [facility], [address] [place], [place] [address]    Report Status PENDING  Incomplete  SARS Coronavirus 2 [facility] order, Performed in [place] hospital lab)     Status: None   Collection Time: 04/14/19 10:45 AM  Result Value Ref Range Status   SARS Coronavirus 2 NEGATIVE NEGATIVE Final    Comment: (NOTE) If result is NEGATIVE SARS-CoV-2 target nucleic acids are NOT DETECTED. The SARS-CoV-2 RNA is generally detectable in upper and lower  respiratory specimens during the acute phase of infection. The lowest  concentration of SARS-CoV-2 viral copies this assay can detect is 250  copies / mL. A negative result does not preclude SARS-CoV-2 infection  and should not be used as the sole basis for treatment or other  patient management decisions.  A negative result may occur with  improper specimen collection / handling, submission of specimen other  than nasopharyngeal swab, presence of viral mutation(s) within the  areas targeted by this assay, and inadequate number of viral copies  (<250 copies / mL). A negative result must be combined with clinical  observations, patient history, and epidemiological information. If result is POSITIVE SARS-CoV-2 target nucleic acids are DETECTED. The SARS-CoV-2 RNA is generally detectable in upper and lower  respiratory specimens dur ing the acute phase of infection.  Positive  results are indicative of active infection with SARS-CoV-2.  Clinical  correlation with patient history and other diagnostic information is  necessary to determine patient infection status.  Positive results do  not rule out bacterial infection or co-infection with other viruses. If result is PRESUMPTIVE POSTIVE SARS-CoV-2 nucleic acids MAY BE PRESENT.   A presumptive positive result was obtained  on the submitted specimen  and confirmed on repeat testing.  While 2019 novel coronavirus  (SARS-CoV-2) nucleic acids may be present in the submitted sample  additional confirmatory testing may be necessary for epidemiological  and / or clinical management purposes  to differentiate between  SARS-CoV-2 and other Sarbecovirus currently known to infect humans.  If clinically indicated additional testing with an alternate test  methodology [phone]) is advised. The SARS-CoV-2 RNA is generally  detectable in upper and lower respiratory sp ecimens during the acute  phase of infection. The expected result is Negative. Fact Sheet for Patients:  StrictlyIdeas.no Fact Sheet for Healthcare Providers: BankingDealers.co.za This test is not yet approved or cleared by the [place] FDA and has been authorized for detection and/or diagnosis of SARS-CoV-2 by FDA under an Emergency Use Authorization (EUA).  This EUA will remain in effect (meaning this test can be used) for the duration of the COVID-19 declaration under Section 564(b)(1) of the Act, 21 U.S.C. section 360bbb-3(b)(1), unless the authorization is terminated or revoked sooner. Performed at [facility], [address] [place], [place] [address]   MRSA PCR Screening     Status: None   Collection Time: 04/15/19  6:09 AM  Result Value Ref Range Status   MRSA by PCR NEGATIVE NEGATIVE Final    Comment:        The GeneXpert MRSA Assay (FDA approved for NASAL specimens only), is one component of a comprehensive MRSA colonization surveillance program. It is not intended to diagnose MRSA infection nor to guide or monitor treatment for MRSA infections. Performed at [facility], [address] [place], [place] [address]     RADIOLOGY:  No results found.  EKG:   Orders  placed or performed in visit on 04/14/19  . EKG 12-Lead      Management plans discussed  with the patient, family and they are in agreement.  CODE STATUS:     Code Status Orders  (From admission, onward)         Start     Ordered   04/14/19 0540  Full code  Continuous     04/14/19 0542        Code Status History    Date Active Date Inactive Code Status Order ID Comments User Context   12/13/2018 1213 12/15/2018 1602 Full Code [id number]  Nicholes Mango, MD Inpatient   12/09/2018 1344 12/10/2018 1935 Full Code [id number]  Dustin Flock, MD ED    Advance Directive Documentation     Most Recent Value  Type of Advance Directive  Living will  Pre-existing out of facility DNR order (yellow form or pink MOST form)  -  "MOST" Form in Place?  -      TOTAL TIME TAKING CARE OF THIS PATIENT: 35 minutes.    Vaughan Basta M.D on 04/16/2019 at 2:08 PM  Between 7am to 6pm - Pager - [phone]  After 6pm go to [email or web address] - password [email or web address] [facility]  Office  [phone]  CC: Primary care physician; Rochel Brome, MD   Note: This dictation was prepared with Dragon dictation along with smaller phrase technology. Any transcriptional errors that result from this process are unintentional.

## 2019-04-16 NOTE — Evaluation (Signed)
Physical Therapy Evaluation Patient Details Name: Susan Navarro MRN: [id number] DOB: 1961-09-09 Today's Date: 04/16/2019   History of Present Illness  58 y.o. African-American female with a known history of type 2 diabetes mellitus, hypertension, dyslipidemia, CVA, presented to the emergency room with acute onset of recurrent vomiting since yesterday morning, found to be in DKA, hyperkalemic, and have UTI     Clinical Impression  Patient easily woken at start of session, transferred to sit EOB independently to converse with PT. Pt reported living in a first floor apartment alone, previously independent in ADLs, IADLs, no AD for ambulation. Pt has a significant other that she sees every weekend.  The patient demonstrated bed mobility independently, and sit <> stand transfers with mod I. UE and LE strength assessed pt with generalized weakness, LUE weaker than RUE (history of CVA).Ambulation attempted without RW, significantly decreased gait velocity, decreased step length, bialteral arm swing. Safety/balance much improved with RW, gait velocity also improved. Pt educated and encouraged to use RW to improve safety during admission/return to home. Higher level balance assessed as well, pt demonstrated limitations. Overall the patient demonstrated deficits (see "PT Problem List") that impede the patient's functional abilities, safety, and mobility and would benefit from skilled PT intervention. Recommendation is HHPT.      Follow Up Recommendations Home health PT    Equipment Recommendations  None recommended by PT(Pt has RW)    Recommendations for Other Services       Precautions / Restrictions Precautions Precautions: Fall Restrictions Weight Bearing Restrictions: No      Mobility  Bed Mobility Overal bed mobility: Independent                Transfers Overall transfer level: Modified independent                  Ambulation/Gait   Gait Distance (Feet): 180  Feet Assistive device: None;Rolling walker (2 wheeled)     Gait velocity interpretation: <1.8 ft/sec, indicate of risk for recurrent falls General Gait Details: Ambulation attempted without RW, significantly decreased gait velocity, decreased step length, bialteral arm swing. Safety/balance much improved with RW, gait velocity also improved  Stairs            Wheelchair Mobility    Modified Rankin (Stroke Patients Only)       Balance Overall balance assessment: Needs assistance Sitting-balance support: Feet supported Sitting balance-Leahy Scale: Good       Standing balance-Leahy Scale: Fair Standing balance comment: safety improved with UE support Single Leg Stance - Right Leg: 0 Single Leg Stance - Left Leg: 0     Rhomberg - Eyes Opened: 15 Rhomberg - Eyes Closed: 0   High Level Balance Comments: one step foward bilaterally with moderate sway, loss of balance in rhomberg position with eyes closed.             Pertinent Vitals/Pain Pain Assessment: No/denies pain    Home Living Family/patient expects to be discharged to:: Private residence Living Arrangements: Alone Available Help at Discharge: Friend(s);Available PRN/intermittently Type of Home: House Home Access: Stairs to enter Entrance Stairs-Rails: None Entrance Stairs-Number of Steps: 1 curb Home Layout: One level Home Equipment: Environmental consultant - 2 wheels;Cane - single point      Prior Function Level of Independence: Independent         Comments: no falls in the last 6 months     Hand Dominance   Dominant Hand: Right    Extremity/Trunk Assessment   Upper Extremity Assessment  Upper Extremity Assessment: Generalized weakness(RUE > stronger than L UE, hx of CVA)    Lower Extremity Assessment Lower Extremity Assessment: Generalized weakness;RLE deficits/detail;LLE deficits/detail RLE Deficits / Details: grossly 4-/5, ankle DF 3+/5 LLE Deficits / Details: grossly 4-/5, ankle DF 3+/5        Communication   Communication: No difficulties  Cognition Arousal/Alertness: Awake/alert Behavior During Therapy: WFL for tasks assessed/performed Overall Cognitive Status: Within Functional Limits for tasks assessed                                        General Comments      Exercises     Assessment/Plan    PT Assessment Patient needs continued PT services  PT Problem List Decreased strength;Decreased range of motion;Decreased balance;Decreased activity tolerance;Decreased mobility       PT Treatment Interventions DME instruction;Therapeutic exercise;Gait training;Balance training;Stair training;Neuromuscular re-education;Functional mobility training;Therapeutic activities;Patient/family education    PT Goals (Current goals can be found in the Care Plan section)  Acute Rehab PT Goals Patient Stated Goal: to go home PT Goal Formulation: With patient Time For Goal Achievement: 04/30/19 Potential to Achieve Goals: Good    Frequency Min 2X/week   Barriers to discharge        Co-evaluation               AM-PAC PT "6 Clicks" Mobility  Outcome Measure Help needed turning from your back to your side while in a flat bed without using bedrails?: None Help needed moving from lying on your back to sitting on the side of a flat bed without using bedrails?: None Help needed moving to and from a bed to a chair (including a wheelchair)?: None Help needed standing up from a chair using your arms (e.g., wheelchair or bedside chair)?: A Little Help needed to walk in hospital room?: A Little Help needed climbing 3-5 steps with a railing? : A Little 6 Click Score: 21    End of Session Equipment Utilized During Treatment: Gait belt Activity Tolerance: Patient tolerated treatment well Patient left: in chair;with chair alarm set;with call bell/phone within reach Nurse Communication: Mobility status PT Visit Diagnosis: Muscle weakness (generalized)  (M62.81);Difficulty in walking, not elsewhere classified (R26.2)    Time: 0037-0488 PT Time Calculation (min) (ACUTE ONLY): 18 min   Charges:   PT Evaluation $PT Eval Low Complexity: 1 Low          Lieutenant Diego PT, DPT 10:40 AM,04/16/19 [phone]

## 2019-04-16 NOTE — TOC Progression Note (Signed)
Transition of Care [facility]) - Progression Note    Patient Details  Name: Nolia Tschantz MRN: [id number] Date of Birth: 05/23/1961  [facility]) CM/SW Contact  Shelbie Hutching, RN Phone Number: 04/16/2019, 2:36 PM  Clinical Narrative:     [place] voucher provided for transport home- patient was unable to get in touch with her boyfriend or anyone else to give her a ride.   Expected Discharge Plan: [place] Barriers to Discharge: Barriers Resolved  Expected Discharge Plan and Services Expected Discharge Plan: [place]   Discharge Planning Services: CM Consult Post Acute Care Choice: [place] arrangements for the past 2 months: Single Family Home Expected Discharge Date: 04/16/19                         HH Arranged: PT [place]: [place] ([place]) Date [place]: 04/16/19 Time [place]: 7182 Representative spoke with at [place]: [place] ([place]) Interventions    Readmission Risk Interventions No flowsheet data found.

## 2019-04-16 NOTE — TOC Transition Note (Signed)
Transition of Care [facility]) - CM/SW Discharge Note   Patient Details  Name: Aaryana Betke MRN: [id number] Date of Birth: 06/12/1961  [facility]) CM/SW Contact:  Shelbie Hutching, RN Phone Number: 04/16/2019, 2:31 PM   Clinical Narrative:    Discharged home with home health PT.     Final next level of care: [place] Barriers to Discharge: Barriers Resolved   Patient Goals and CMS Choice Patient states their goals for this hospitalization and ongoing recovery are:: Home with home health CMS Medicare.gov Compare Post Acute Care list provided to:: Patient Choice offered to / list presented to : Patient  Discharge Placement                       Discharge Plan and Services   Discharge Planning Services: CM Consult Post Acute Care Choice: Home Health                    HH Arranged: PT [facility] Agency: [place] ([place]) Date [facility] Agency Contacted: 04/16/19 Time [place]: 8295 Representative spoke with at [place]: [place] ([place]) Interventions     Readmission Risk Interventions No flowsheet data found.

## 2019-04-19 LAB — CULTURE, BLOOD (ROUTINE X 2)
Culture: NO GROWTH
Culture: NO GROWTH
Special Requests: ADEQUATE
Special Requests: ADEQUATE

## 2019-08-05 ENCOUNTER — Encounter: Payer: Self-pay | Admitting: General Practice

## 2019-08-05 ENCOUNTER — Inpatient Hospital Stay
Admission: EM | Admit: 2019-08-05 | Discharge: 2019-08-07 | DRG: 638 | Disposition: A | Discharge status: Home / Self Care | Payer: Medicare HMO | Attending: Internal Medicine | Admitting: Internal Medicine

## 2019-08-05 ENCOUNTER — Other Ambulatory Visit: Payer: Self-pay

## 2019-08-05 DIAGNOSIS — N179 Acute kidney failure, unspecified: Secondary | ICD-10-CM | POA: Diagnosis present

## 2019-08-05 DIAGNOSIS — Z833 Family history of diabetes mellitus: Secondary | ICD-10-CM

## 2019-08-05 DIAGNOSIS — N390 Urinary tract infection, site not specified: Secondary | ICD-10-CM | POA: Diagnosis present

## 2019-08-05 DIAGNOSIS — Z79899 Other long term (current) drug therapy: Secondary | ICD-10-CM | POA: Diagnosis not present

## 2019-08-05 DIAGNOSIS — Z20828 Contact with and (suspected) exposure to other viral communicable diseases: Secondary | ICD-10-CM | POA: Diagnosis present

## 2019-08-05 DIAGNOSIS — E1122 Type 2 diabetes mellitus with diabetic chronic kidney disease: Secondary | ICD-10-CM | POA: Diagnosis present

## 2019-08-05 DIAGNOSIS — I69334 Monoplegia of upper limb following cerebral infarction affecting left non-dominant side: Secondary | ICD-10-CM | POA: Diagnosis not present

## 2019-08-05 DIAGNOSIS — E1110 Type 2 diabetes mellitus with ketoacidosis without coma: Principal | ICD-10-CM | POA: Diagnosis present

## 2019-08-05 DIAGNOSIS — Z7982 Long term (current) use of aspirin: Secondary | ICD-10-CM | POA: Diagnosis not present

## 2019-08-05 DIAGNOSIS — E860 Dehydration: Secondary | ICD-10-CM | POA: Diagnosis present

## 2019-08-05 DIAGNOSIS — R1110 Vomiting, unspecified: Secondary | ICD-10-CM

## 2019-08-05 DIAGNOSIS — E0810 Diabetes mellitus due to underlying condition with ketoacidosis without coma: Secondary | ICD-10-CM

## 2019-08-05 DIAGNOSIS — Z794 Long term (current) use of insulin: Secondary | ICD-10-CM

## 2019-08-05 DIAGNOSIS — I129 Hypertensive chronic kidney disease with stage 1 through stage 4 chronic kidney disease, or unspecified chronic kidney disease: Secondary | ICD-10-CM | POA: Diagnosis present

## 2019-08-05 DIAGNOSIS — N183 Chronic kidney disease, stage 3 (moderate): Secondary | ICD-10-CM | POA: Diagnosis present

## 2019-08-05 LAB — CBC WITH DIFFERENTIAL/PLATELET
Abs Immature Granulocytes: 0.02 10*3/uL (ref 0.00–0.07)
Basophils Absolute: 0 10*3/uL (ref 0.0–0.1)
Basophils Relative: 0 %
Eosinophils Absolute: 0 10*3/uL (ref 0.0–0.5)
Eosinophils Relative: 0 %
HCT: 28.4 % — ABNORMAL LOW (ref 36.0–46.0)
Hemoglobin: 9.1 g/dL — ABNORMAL LOW (ref 12.0–15.0)
Immature Granulocytes: 0 %
Lymphocytes Relative: 7 %
Lymphs Abs: 0.5 10*3/uL — ABNORMAL LOW (ref 0.7–4.0)
MCH: 30 pg (ref 26.0–34.0)
MCHC: 32 g/dL (ref 30.0–36.0)
MCV: 93.7 fL (ref 80.0–100.0)
Monocytes Absolute: 0.2 10*3/uL (ref 0.1–1.0)
Monocytes Relative: 2 %
Neutro Abs: 6.7 10*3/uL (ref 1.7–7.7)
Neutrophils Relative %: 91 %
Platelets: 88 10*3/uL — ABNORMAL LOW (ref 150–400)
RBC: 3.03 MIL/uL — ABNORMAL LOW (ref 3.87–5.11)
RDW: 12.6 % (ref 11.5–15.5)
WBC: 7.5 10*3/uL (ref 4.0–10.5)
nRBC: 0 % (ref 0.0–0.2)

## 2019-08-05 LAB — BASIC METABOLIC PANEL
Anion gap: 20 — ABNORMAL HIGH (ref 5–15)
Anion gap: 14 (ref 5–15)
Anion gap: 12 (ref 5–15)
BUN: 60 mg/dL — ABNORMAL HIGH (ref 6–20)
BUN: 50 mg/dL — ABNORMAL HIGH (ref 6–20)
BUN: 56 mg/dL — ABNORMAL HIGH (ref 6–20)
CO2: 15 mmol/L — ABNORMAL LOW (ref 22–32)
CO2: 13 mmol/L — ABNORMAL LOW (ref 22–32)
CO2: 20 mmol/L — ABNORMAL LOW (ref 22–32)
Calcium: 8.6 mg/dL — ABNORMAL LOW (ref 8.9–10.3)
Calcium: 7.4 mg/dL — ABNORMAL LOW (ref 8.9–10.3)
Calcium: 8.6 mg/dL — ABNORMAL LOW (ref 8.9–10.3)
Chloride: 97 mmol/L — ABNORMAL LOW (ref 98–111)
Chloride: 109 mmol/L (ref 98–111)
Chloride: 107 mmol/L (ref 98–111)
Creatinine, Ser: 2.56 mg/dL — ABNORMAL HIGH (ref 0.44–1.00)
Creatinine, Ser: 2.02 mg/dL — ABNORMAL HIGH (ref 0.44–1.00)
Creatinine, Ser: 2.08 mg/dL — ABNORMAL HIGH (ref 0.44–1.00)
GFR calc Af Amer: 23 mL/min — ABNORMAL LOW (ref >60)
GFR calc Af Amer: 31 mL/min — ABNORMAL LOW (ref >60)
GFR calc Af Amer: 29 mL/min — ABNORMAL LOW (ref >60)
GFR calc non Af Amer: 20 mL/min — ABNORMAL LOW (ref >60)
GFR calc non Af Amer: 26 mL/min — ABNORMAL LOW (ref >60)
GFR calc non Af Amer: 25 mL/min — ABNORMAL LOW (ref >60)
Glucose, Bld: 747 mg/dL (ref 70–99)
Glucose, Bld: 467 mg/dL — ABNORMAL HIGH (ref 70–99)
Glucose, Bld: 348 mg/dL — ABNORMAL HIGH (ref 70–99)
Potassium: 5 mmol/L (ref 3.5–5.1)
Potassium: 3.7 mmol/L (ref 3.5–5.1)
Potassium: 3.5 mmol/L (ref 3.5–5.1)
Sodium: 132 mmol/L — ABNORMAL LOW (ref 135–145)
Sodium: 136 mmol/L (ref 135–145)
Sodium: 139 mmol/L (ref 135–145)

## 2019-08-05 LAB — URINALYSIS, COMPLETE (UACMP) WITH MICROSCOPIC
Bilirubin Urine: NEGATIVE
Glucose, UA: >=500 mg/dL — AB
Ketones, ur: 20 mg/dL — AB
Nitrite: NEGATIVE
Protein, ur: NEGATIVE mg/dL
Specific Gravity, Urine: 1.014 (ref 1.005–1.030)
pH: 5 (ref 5.0–8.0)

## 2019-08-05 LAB — SARS CORONAVIRUS 2 BY RT PCR (HOSPITAL ORDER, PERFORMED IN ~~LOC~~ HOSPITAL LAB): SARS Coronavirus 2: NEGATIVE

## 2019-08-05 LAB — GLUCOSE, CAPILLARY
Glucose-Capillary: >600 mg/dL (ref 70–99)
Glucose-Capillary: >600 mg/dL (ref 70–99)
Glucose-Capillary: 561 mg/dL (ref 70–99)
Glucose-Capillary: 517 mg/dL (ref 70–99)
Glucose-Capillary: 463 mg/dL — ABNORMAL HIGH (ref 70–99)
Glucose-Capillary: 395 mg/dL — ABNORMAL HIGH (ref 70–99)
Glucose-Capillary: 318 mg/dL — ABNORMAL HIGH (ref 70–99)
Glucose-Capillary: 241 mg/dL — ABNORMAL HIGH (ref 70–99)
Glucose-Capillary: 154 mg/dL — ABNORMAL HIGH (ref 70–99)
Glucose-Capillary: 112 mg/dL — ABNORMAL HIGH (ref 70–99)

## 2019-08-05 LAB — MRSA PCR SCREENING: MRSA by PCR: NEGATIVE

## 2019-08-05 LAB — CHLAMYDIA/NGC RT PCR (ARMC ONLY): Chlamydia Tr: NOT DETECTED

## 2019-08-05 LAB — CHLAMYDIA/NGC RT PCR (ARMC ONLY)??????????: N gonorrhoeae: NOT DETECTED

## 2019-08-05 MED ORDER — POLYETHYLENE GLYCOL 3350 17 G PO PACK: 17.0000 g | PACK | Freq: Every day | ORAL | Status: DC | PRN

## 2019-08-05 MED ORDER — ONDANSETRON HCL 4 MG PO TABS: 4.0000 mg | ORAL_TABLET | Freq: Four times a day (QID) | ORAL | Status: DC | PRN

## 2019-08-05 MED ORDER — LISINOPRIL 5 MG PO TABS
2.5000 mg | ORAL_TABLET | Freq: Every day | ORAL | Status: DC
  Filled 2019-08-05: qty 1

## 2019-08-05 MED ORDER — SODIUM CHLORIDE 0.9 % IV SOLN
1.0000 g | Freq: Once | INTRAVENOUS | Status: AC
  Administered 2019-08-05: 1 g via INTRAVENOUS
  Filled 2019-08-05: qty 10

## 2019-08-05 MED ORDER — SODIUM CHLORIDE 0.9 % IV SOLN
1.0000 g | Freq: Every day | INTRAVENOUS | Status: DC
  Administered 2019-08-06: 1 g via INTRAVENOUS
  Filled 2019-08-05 (×3): qty 10
  Filled 2019-08-05: qty 1

## 2019-08-05 MED ORDER — CHLORHEXIDINE GLUCONATE CLOTH 2 % EX PADS: 6.0000 | MEDICATED_PAD | Freq: Every day | CUTANEOUS | Status: DC

## 2019-08-05 MED ORDER — ATORVASTATIN CALCIUM 20 MG PO TABS
80.0000 mg | ORAL_TABLET | Freq: Every evening | ORAL | Status: DC
  Administered 2019-08-05 – 2019-08-06 (×2): 80 mg via ORAL
  Filled 2019-08-05 (×2): qty 4

## 2019-08-05 MED ORDER — ALBUTEROL SULFATE (2.5 MG/3ML) 0.083% IN NEBU: 2.5000 mg | INHALATION_SOLUTION | RESPIRATORY_TRACT | Status: DC | PRN

## 2019-08-05 MED ORDER — SODIUM CHLORIDE 0.9 % IV BOLUS
2000.0000 mL | Freq: Once | INTRAVENOUS | Status: AC
  Administered 2019-08-05: 2000 mL via INTRAVENOUS

## 2019-08-05 MED ORDER — DEXTROSE-NACL 5-0.45 % IV SOLN
INTRAVENOUS | Status: DC
  Administered 2019-08-05: 22:00:00 via INTRAVENOUS

## 2019-08-05 MED ORDER — SODIUM CHLORIDE 0.9 % IV BOLUS
1000.0000 mL | Freq: Once | INTRAVENOUS | Status: AC
  Administered 2019-08-05: 1000 mL via INTRAVENOUS

## 2019-08-05 MED ORDER — SODIUM CHLORIDE 0.9 % IV SOLN
INTRAVENOUS | Status: DC
  Administered 2019-08-05: 19:00:00 via INTRAVENOUS

## 2019-08-05 MED ORDER — METOPROLOL TARTRATE 25 MG PO TABS
25.0000 mg | ORAL_TABLET | Freq: Every day | ORAL | Status: DC
  Administered 2019-08-06 – 2019-08-07 (×2): 25 mg via ORAL
  Filled 2019-08-05 (×2): qty 1

## 2019-08-05 MED ORDER — ONDANSETRON HCL 4 MG/2ML IJ SOLN
4.0000 mg | Freq: Four times a day (QID) | INTRAMUSCULAR | Status: DC | PRN
  Administered 2019-08-05: 4 mg via INTRAVENOUS
  Filled 2019-08-05: qty 2

## 2019-08-05 MED ORDER — DEXTROSE-NACL 5-0.45 % IV SOLN: INTRAVENOUS | Status: DC

## 2019-08-05 MED ORDER — HEPARIN SODIUM (PORCINE) 5000 UNIT/ML IJ SOLN
5000.0000 [IU] | Freq: Three times a day (TID) | INTRAMUSCULAR | Status: DC
  Administered 2019-08-05 – 2019-08-06 (×2): 5000 [IU] via SUBCUTANEOUS
  Filled 2019-08-05 (×2): qty 1

## 2019-08-05 MED ORDER — AMLODIPINE BESYLATE 5 MG PO TABS
5.0000 mg | ORAL_TABLET | Freq: Every day | ORAL | Status: DC
  Filled 2019-08-05: qty 1

## 2019-08-05 MED ORDER — ACETAMINOPHEN 650 MG RE SUPP: 650.0000 mg | Freq: Four times a day (QID) | RECTAL | Status: DC | PRN

## 2019-08-05 MED ORDER — ASPIRIN EC 81 MG PO TBEC
81.0000 mg | DELAYED_RELEASE_TABLET | Freq: Every day | ORAL | Status: DC
  Administered 2019-08-06 – 2019-08-07 (×2): 81 mg via ORAL
  Filled 2019-08-05 (×2): qty 1

## 2019-08-05 MED ORDER — INSULIN REGULAR(HUMAN) IN NACL 100-0.9 UT/100ML-% IV SOLN
INTRAVENOUS | Status: DC
  Administered 2019-08-05: 5.4 [IU]/h via INTRAVENOUS
  Filled 2019-08-05 (×2): qty 100

## 2019-08-05 MED ORDER — ACETAMINOPHEN 325 MG PO TABS: 650.0000 mg | ORAL_TABLET | Freq: Four times a day (QID) | ORAL | Status: DC | PRN

## 2019-08-05 MED ORDER — SODIUM CHLORIDE 0.9% FLUSH
3.0000 mL | Freq: Two times a day (BID) | INTRAVENOUS | Status: DC
  Administered 2019-08-05 – 2019-08-06 (×3): 3 mL via INTRAVENOUS

## 2019-08-05 NOTE — ED Notes (Signed)
Date and time results received: 08/05/19 1428 (use smartphrase ".now" to insert current time)  Test: Blood Sugar Critical Value: [place]  Name of Provider Notified: Archie Balboa

## 2019-08-05 NOTE — ED Triage Notes (Signed)
Pt called [facility] for vomiting that started this AM. Found to have critical high sugar >600. Pt denies any pain, no fevers, has been taking her insulin.

## 2019-08-05 NOTE — ED Provider Notes (Signed)
[facility] Emergency Department Provider Note  ____________________________________________   I have reviewed the triage vital signs and the nursing notes.   HISTORY  Chief Complaint Emesis   History limited by: Not Limited   HPI Susan Navarro is a 58 y.o. female who presents to the emergency department today because of concern for emesis. Says that vomiting started this morning. Has had multiple episodes. Has not noticed any blood in the emesis. No significant pain in the abdomen with the vomiting. No diarrhea. Says that sugars were in the 160s yesterday. Did notice some burning with urination earlier this week. Denies any fevers.    Records reviewed. Per medical record review patient has a history of DKA  Past Medical History:  Diagnosis Date  . Anemia   . Chronic kidney disease   . Diabetes mellitus without complication ([place])   . Hypertension   . Stroke [facility]) 03/2018   Ischemic stroke per [facility] records    Patient Active Problem List   Diagnosis Date Noted  . DKA (diabetic ketoacidoses) ([place]) 12/09/2018    No past surgical history on file.  Prior to Admission medications   Medication Sig Start Date End Date Taking? Authorizing Provider  aspirin EC 81 MG tablet Take 81 mg by mouth daily. 02/09/17   [provider]  atorvastatin (LIPITOR) 80 MG tablet Take 80 mg by mouth every evening. 11/23/18 11/23/19  [provider]  Insulin Glargine (LANTUS SOLOSTAR) 100 UNIT/ML Solostar Pen Inject 15 Units into the skin daily. 11/30/18   [provider]  insulin lispro (HUMALOG KWIKPEN) 100 UNIT/ML KwikPen Inject 5 Units into the skin 3 (three) times daily before meals. 11/30/18 11/30/19  [provider]  metoprolol tartrate (LOPRESSOR) 25 MG tablet Take 25 mg by mouth daily. 11/23/18 11/23/19  [provider]    Allergies Patient has no known allergies.  Family History  Problem Relation Age of Onset  . Diabetes  Mother   . Diabetes Brother     Social History Social History   Tobacco Use  . Smoking status: Never Smoker  . Smokeless tobacco: Never Used  Substance Use Topics  . Alcohol use: No  . Drug use: Not on file    Review of Systems Constitutional: No fever/chills Eyes: No visual changes. ENT: No sore throat. Cardiovascular: Denies chest pain. Respiratory: Denies shortness of breath. Gastrointestinal: No abdominal pain. Positive for emesis.  Genitourinary: Positive for dysuria.  Musculoskeletal: Negative for back pain. Skin: Negative for rash. Neurological: Negative for headaches, focal weakness or numbness.  ____________________________________________   PHYSICAL EXAM:  VITAL SIGNS: ED Triage Vitals  Enc Vitals Group     BP 08/05/19 1359 (!) 128/54     Pulse Rate 08/05/19 1359 (!) 108     Resp 08/05/19 1359 16     Temp 08/05/19 1359 98.6 F (37 C)     Temp Source 08/05/19 1359 Oral     SpO2 08/05/19 1359 100 %     Weight 08/05/19 1351 140 lb (63.5 kg)     Height 08/05/19 1351 5' 6"  (1.676 m)     Head Circumference --      Peak Flow --      Pain Score 08/05/19 1351 0   Constitutional: Alert and oriented.  Eyes: Conjunctivae are normal.  ENT      Head: Normocephalic and atraumatic.      Nose: No congestion/rhinnorhea.      Mouth/Throat: Mucous membranes are moist.  Neck: No stridor. Hematological/Lymphatic/Immunilogical: No cervical lymphadenopathy. Cardiovascular: Normal rate, regular rhythm.  No murmurs, rubs, or gallops.  Respiratory: Normal respiratory effort without tachypnea nor retractions. Breath sounds are clear and equal bilaterally. No wheezes/rales/rhonchi. Gastrointestinal: Soft and non tender. No rebound. No guarding.  Genitourinary: Deferred Musculoskeletal: Normal range of motion in all extremities. No lower extremity edema. Neurologic:  Normal speech and language. No gross focal neurologic deficits are appreciated.  Skin:  Skin is warm, dry  and intact. No rash noted. Psychiatric: Mood and affect are normal. Speech and behavior are normal. Patient exhibits appropriate insight and judgment.  ____________________________________________    LABS (pertinent positives/negatives)  VBG pH 7.29 CBC wbc 7.5, hgb 9.1, plt 88 BMP glu 747, anion gap 20 UA cloudy, moderate leukocytes, 20 ketones, 21-50 wbc, wbc clumps present ____________________________________________   EKG  None  ____________________________________________    RADIOLOGY  None ____________________________________________   PROCEDURES  Procedures  CRITICAL CARE Performed by: Nance Pear   Total critical care time: 30 minutes  Critical care time was exclusive of separately billable procedures and treating other patients.  Critical care was necessary to treat or prevent imminent or life-threatening deterioration.  Critical care was time spent personally by me on the following activities: development of treatment plan with patient and/or surrogate as well as nursing, discussions with consultants, evaluation of patient's response to treatment, examination of patient, obtaining history from patient or surrogate, ordering and performing treatments and interventions, ordering and review of laboratory studies, ordering and review of radiographic studies, pulse oximetry and re-evaluation of patient's condition.  ____________________________________________   INITIAL IMPRESSION / ASSESSMENT AND PLAN / ED COURSE  Pertinent labs & imaging results that were available during my care of the patient were reviewed by me and considered in my medical decision making (see chart for details).   Patient presented to the emergency department today because of concern for emesis. Patient has history of diabetes and DKA in the past. Initial CBG was noted to be significantly elevated. The patient's blood work did show elevated anion gap. Patient had ketones in urine. Do  have concern that patient is in DKA again. Complained of dysuria and UA is concerning for infection. Think this could have been what caused the patient to develop DKA. Did start IV insulin. Will plan on admission.   ____________________________________________   FINAL CLINICAL IMPRESSION(S) / ED DIAGNOSES  Final diagnoses:  Vomiting, intractability of vomiting not specified, presence of nausea not specified, unspecified vomiting type  Diabetic ketoacidosis without coma associated with diabetes mellitus due to underlying condition [facility])     Note: This dictation was prepared with Dragon dictation. Any transcriptional errors that result from this process are unintentional     Nance Pear, MD 08/05/19 1517

## 2019-08-05 NOTE — ED Notes (Signed)
Patient reports that she has been taking her insulin but not checking BG. BG critical high on arrival.

## 2019-08-05 NOTE — Progress Notes (Signed)
Patient's mother and daughter updated via phone about patient's condition per patient request.

## 2019-08-05 NOTE — ED Notes (Signed)
ED TO INPATIENT HANDOFF REPORT  ED Nurse Name and Phone #: Katie RN [phone]  S Name/Age/Gender Susan Navarro 58 y.o. female Room/Bed: ED18A/ED18A  Code Status   Code Status: Full Code  Home/SNF/Other Home Patient oriented to: self, place, time and situation Is this baseline? Yes   Triage Complete: Triage complete  Chief Complaint hyperglycemia  Triage Note Pt called Ems for vomiting that started this AM. Found to have critical high sugar >600. Pt denies any pain, no fevers, has been taking her insulin.     Allergies No Known Allergies  Level of Care/Admitting Diagnosis ED Disposition    ED Disposition Condition [facility] Area: [place] [[id number]]  Level of Care: Stepdown [14]  Covid Evaluation: Asymptomatic Screening Protocol (No Symptoms)  Diagnosis: DKA (diabetic ketoacidoses) [facility]) [[id number]]  Admitting Physician: Hillary Bow [[id number]]  Attending Physician: Hillary Bow [phone]  Estimated length of stay: past midnight tomorrow  Certification:: I certify this patient will need inpatient services for at least 2 midnights  PT Class (Do Not Modify): Inpatient [101]  PT Acc Code (Do Not Modify): Private [1]       B Medical/Surgery History Past Medical History:  Diagnosis Date  . Anemia   . Chronic kidney disease   . Diabetes mellitus without complication ([place])   . Hypertension   . Stroke [facility]) 03/2018   Ischemic stroke per [facility] records   No past surgical history on file.   A IV Location/Drains/Wounds Patient Lines/Drains/Airways Status   Active Line/Drains/Airways    Name:   Placement date:   Placement time:   Site:   Days:   Peripheral IV 08/05/19 Posterior;Right Forearm   08/05/19    -    Forearm   less than 1   Peripheral IV 08/05/19 Left Hand   08/05/19    1420    Hand   less than 1   External Urinary Catheter   08/05/19    1514    -   less than 1   Wound / Incision (Open or Dehisced) 12/09/18 Diabetic ulcer  Foot Right;Other (Comment);Posterior dark purple   12/09/18    1500    Foot   239          Intake/Output Last 24 hours No intake or output data in the 24 hours ending 08/05/19 1615  Labs/Imaging Results for orders placed or performed during the hospital encounter of 08/05/19 (from the past 48 hour(s))  Glucose, capillary     Status: Abnormal   Collection Time: 08/05/19  1:52 PM  Result Value Ref Range   Glucose-Capillary >600 (HH) 70 - 99 mg/dL  CBC with Differential     Status: Abnormal   Collection Time: 08/05/19  2:01 PM  Result Value Ref Range   WBC 7.5 4.0 - 10.5 K/uL   RBC 3.03 (L) 3.87 - 5.11 MIL/uL   Hemoglobin 9.1 (L) 12.0 - 15.0 g/dL   HCT 28.4 (L) 36.0 - 46.0 %   MCV 93.7 80.0 - 100.0 fL   MCH 30.0 26.0 - 34.0 pg   MCHC 32.0 30.0 - 36.0 g/dL   RDW 12.6 11.5 - 15.5 %   Platelets 88 (L) 150 - 400 K/uL    Comment: Immature Platelet Fraction may be clinically indicated, consider ordering this additional test [id number]    nRBC 0.0 0.0 - 0.2 %   Neutrophils Relative % 91 %   Neutro Abs 6.7 1.7 - 7.7 K/uL   Lymphocytes Relative  7 %   Lymphs Abs 0.5 (L) 0.7 - 4.0 K/uL   Monocytes Relative 2 %   Monocytes Absolute 0.2 0.1 - 1.0 K/uL   Eosinophils Relative 0 %   Eosinophils Absolute 0.0 0.0 - 0.5 K/uL   Basophils Relative 0 %   Basophils Absolute 0.0 0.0 - 0.1 K/uL   Immature Granulocytes 0 %   Abs Immature Granulocytes 0.02 0.00 - 0.07 K/uL    Comment: Performed at [facility], [address] [place], [place] [address]  Basic metabolic panel     Status: Abnormal   Collection Time: 08/05/19  2:01 PM  Result Value Ref Range   Sodium 132 (L) 135 - 145 mmol/L   Potassium 5.0 3.5 - 5.1 mmol/L   Chloride 97 (L) 98 - 111 mmol/L   CO2 15 (L) 22 - 32 mmol/L   Glucose, Bld 747 (HH) 70 - 99 mg/dL    Comment: CRITICAL RESULT CALLED TO, READ BACK BY AND VERIFIED WITH Noemi Ishmael ON 08/05/2019 AT 1428 TIK/MLK    BUN 60 (H) 6 - 20 mg/dL   Creatinine, Ser  2.56 (H) 0.44 - 1.00 mg/dL   Calcium 8.6 (L) 8.9 - 10.3 mg/dL   GFR calc non Af Amer 20 (L) >60 mL/min   GFR calc Af Amer 23 (L) >60 mL/min   Anion gap 20 (H) 5 - 15    Comment: Performed at [facility], [address] [place], [place] [address]  Urinalysis, Complete w Microscopic     Status: Abnormal   Collection Time: 08/05/19  2:01 PM  Result Value Ref Range   Color, Urine YELLOW (A) YELLOW   APPearance CLOUDY (A) CLEAR   Specific Gravity, Urine 1.014 1.005 - 1.030   pH 5.0 5.0 - 8.0   Glucose, UA >=500 (A) NEGATIVE mg/dL   Hgb urine dipstick SMALL (A) NEGATIVE   Bilirubin Urine NEGATIVE NEGATIVE   Ketones, ur 20 (A) NEGATIVE mg/dL   Protein, ur NEGATIVE NEGATIVE mg/dL   Nitrite NEGATIVE NEGATIVE   Leukocytes,Ua MODERATE (A) NEGATIVE   RBC / HPF 0-5 0 - 5 RBC/hpf   WBC, UA 21-50 0 - 5 WBC/hpf   Bacteria, UA RARE (A) NONE SEEN   Squamous Epithelial / LPF 6-10 0 - 5   WBC Clumps PRESENT    Mucus PRESENT     Comment: Performed at [facility], [address] [place],  [address]  Blood gas, venous     Status: Abnormal (Preliminary result)   Collection Time: 08/05/19  2:04 PM  Result Value Ref Range   pH, Ven 7.29 7.250 - 7.430   pCO2, Ven 35 (L) 44.0 - 60.0 mmHg   pO2, Ven PENDING 32.0 - 45.0 mmHg   Patient temperature 37.0    Collection site VENOUS    Sample type VENOUS     Comment: Performed at [facility], [address] [place], [place] [address]  Glucose, capillary     Status: Abnormal   Collection Time: 08/05/19  2:44 PM  Result Value Ref Range   Glucose-Capillary 599 (HH) 70 - 99 mg/dL   Comment 1 Notify RN   Glucose, capillary     Status: Abnormal   Collection Time: 08/05/19  2:45 PM  Result Value Ref Range   Glucose-Capillary >600 (HH) 70 - 99 mg/dL  SARS Coronavirus 2 [facility] order, Performed in [facility] hospital lab) Nasopharyngeal Nasopharyngeal Swab     Status: None   Collection Time: 08/05/19  2:57 PM    Specimen: Nasopharyngeal Swab  Result Value Ref Range   SARS Coronavirus 2 NEGATIVE NEGATIVE    Comment: (NOTE) If result is NEGATIVE SARS-CoV-2 target nucleic acids are NOT DETECTED. The SARS-CoV-2 RNA is generally detectable in upper and lower  respiratory specimens during the acute phase of infection. The lowest  concentration of SARS-CoV-2 viral copies this assay can detect is 250  copies / mL. A negative result does not preclude SARS-CoV-2 infection  and should not be used as the sole basis for treatment or other  patient management decisions.  A negative result may occur with  improper specimen collection / handling, submission of specimen other  than nasopharyngeal swab, presence of viral mutation(s) within the  areas targeted by this assay, and inadequate number of viral copies  (<250 copies / mL). A negative result must be combined with clinical  observations, patient history, and epidemiological information. If result is POSITIVE SARS-CoV-2 target nucleic acids are DETECTED. The SARS-CoV-2 RNA is generally detectable in upper and lower  respiratory specimens dur ing the acute phase of infection.  Positive  results are indicative of active infection with SARS-CoV-2.  Clinical  correlation with patient history and other diagnostic information is  necessary to determine patient infection status.  Positive results do  not rule out bacterial infection or co-infection with other viruses. If result is PRESUMPTIVE POSTIVE SARS-CoV-2 nucleic acids MAY BE PRESENT.   A presumptive positive result was obtained on the submitted specimen  and confirmed on repeat testing.  While 2019 novel coronavirus  (SARS-CoV-2) nucleic acids may be present in the submitted sample  additional confirmatory testing may be necessary for epidemiological  and / or clinical management purposes  to differentiate between  SARS-CoV-2 and other Sarbecovirus currently known to infect humans.  If clinically  indicated additional testing with an alternate test  methodology [phone]) is advised. The SARS-CoV-2 RNA is generally  detectable in upper and lower respiratory sp ecimens during the acute  phase of infection. The expected result is Negative. Fact Sheet for Patients:  StrictlyIdeas.no Fact Sheet for Healthcare Providers: BankingDealers.co.za This test is not yet approved or cleared by the [place] FDA and has been authorized for detection and/or diagnosis of SARS-CoV-2 by FDA under an Emergency Use Authorization (EUA).  This EUA will remain in effect (meaning this test can be used) for the duration of the COVID-19 declaration under Section 564(b)(1) of the Act, 21 U.S.C. section 360bbb-3(b)(1), unless the authorization is terminated or revoked sooner. Performed at [facility], [address] [place], [place] [address]   Glucose, capillary     Status: Abnormal   Collection Time: 08/05/19  3:51 PM  Result Value Ref Range   Glucose-Capillary 561 (HH) 70 - 99 mg/dL   Comment 1 Notify RN    No results found.  Pending Labs Unresulted Labs (From admission, onward)    Start     Ordered   08/06/19 8889  Basic metabolic panel  Tomorrow morning,   STAT     08/05/19 1608   08/06/19 0500  CBC  Tomorrow morning,   STAT     08/05/19 1608   08/05/19 1694  Basic metabolic panel  STAT Now then every 4 hours ,   STAT     08/05/19 1603   08/05/19 1430  Chlamydia/NGC rt PCR ([facility] only)  Add-on,   AD     08/05/19 1429   08/05/19 1425  Urine Culture  Add-on,   AD  08/05/19 1424          Vitals/Pain Today's Vitals   08/05/19 1430 08/05/19 1500 08/05/19 1530 08/05/19 1600  BP: 137/61 131/61 132/62 130/62  Pulse: (!) 105  (!) 109 (!) 106  Resp: 19 17 16 18   Temp:      TempSrc:      SpO2: 100% 98% 100% 98%  Weight:      Height:      PainSc:        Isolation Precautions No active isolations  Medications Medications   insulin regular, human (MYXREDLIN) 100 units/ 100 mL infusion (10 Units/hr Intravenous Rate/Dose Change 08/05/19 1552)  0.9 %  sodium chloride infusion (has no administration in time range)  dextrose 5 %-0.45 % sodium chloride infusion (has no administration in time range)  heparin injection 5,000 Units (has no administration in time range)  sodium chloride flush (NS) 0.9 % injection 3 mL (has no administration in time range)  acetaminophen (TYLENOL) tablet 650 mg (has no administration in time range)    Or  acetaminophen (TYLENOL) suppository 650 mg (has no administration in time range)  polyethylene glycol (MIRALAX / GLYCOLAX) packet 17 g (has no administration in time range)  ondansetron (ZOFRAN) tablet 4 mg (has no administration in time range)    Or  ondansetron (ZOFRAN) injection 4 mg (has no administration in time range)  albuterol (PROVENTIL) (2.5 MG/3ML) 0.083% nebulizer solution 2.5 mg (has no administration in time range)  cefTRIAXone (ROCEPHIN) 1 g in sodium chloride 0.9 % 100 mL IVPB (has no administration in time range)  sodium chloride 0.9 % bolus 1,000 mL (0 mLs Intravenous Stopped 08/05/19 1514)  cefTRIAXone (ROCEPHIN) 1 g in sodium chloride 0.9 % 100 mL IVPB (0 g Intravenous Stopped 08/05/19 1514)  sodium chloride 0.9 % bolus 2,000 mL (2,000 mLs Intravenous New Bag/Given 08/05/19 1611)    Mobility walks Low fall risk   Focused Assessments Patient here for vomiting and DKA   R Recommendations: See Admitting Provider Note  Report given to:   Additional Notes:

## 2019-08-05 NOTE — H&P (Signed)
[facility] [facility] [place] NAME: [name]    MR#:  [id number]  DATE OF BIRTH:  [birth year]  DATE OF ADMISSION:  08/05/2019  PRIMARY CARE PHYSICIAN: Rochel Brome, MD   REQUESTING/REFERRING PHYSICIAN: Dr. Archie Balboa  CHIEF COMPLAINT:   Chief Complaint  Patient presents with  . Emesis    HISTORY OF PRESENT ILLNESS:  Susan Navarro  is a 58 y.o. female with a known history of hypertension, diabetes mellitus, CVA, CKD3 here with vomiting and high blood sugars. Here she has no abd pain. Has had dysuria. Did not miss any insulin doses. Elevated AG, low bicarb, BS 760. In DKA  PAST MEDICAL HISTORY:   Past Medical History:  Diagnosis Date  . Anemia   . Chronic kidney disease   . Diabetes mellitus without complication ([place])   . Hypertension   . Stroke ([place]) 03/2018   Ischemic stroke per [facility] records    PAST SURGICAL HISTORY:  No past surgical history on file.  SOCIAL HISTORY:   Social History   Tobacco Use  . Smoking status: Never Smoker  . Smokeless tobacco: Never Used  Substance Use Topics  . Alcohol use: No    FAMILY HISTORY:   Family History  Problem Relation Age of Onset  . Diabetes Mother   . Diabetes Brother     DRUG ALLERGIES:  No Known Allergies  REVIEW OF SYSTEMS:   Review of Systems  Constitutional: Positive for malaise/fatigue. Negative for chills, fever and weight loss.  HENT: Negative for hearing loss and nosebleeds.   Eyes: Negative for blurred vision, double vision and pain.  Respiratory: Negative for cough, hemoptysis, sputum production, shortness of breath and wheezing.   Cardiovascular: Negative for chest pain, palpitations, orthopnea and leg swelling.  Gastrointestinal: Positive for nausea and vomiting. Negative for abdominal pain, constipation and diarrhea.  Genitourinary: Negative for dysuria and hematuria.  Musculoskeletal: Negative for back pain, falls and myalgias.  Skin: Negative for rash.   Neurological: Negative for dizziness, tremors, sensory change, speech change, focal weakness, seizures and headaches.  Endo/Heme/Allergies: Does not bruise/bleed easily.  Psychiatric/Behavioral: Negative for depression and memory loss. The patient is not nervous/anxious.     MEDICATIONS AT HOME:   Prior to Admission medications   Medication Sig Start Date End Date Taking? Authorizing Provider  amLODipine (NORVASC) 5 MG tablet Take 1 tablet by mouth daily at 8 pm. 07/07/19  Yes [provider]  aspirin EC 81 MG tablet Take 81 mg by mouth daily. 02/09/17  Yes [provider]  atorvastatin (LIPITOR) 80 MG tablet Take 80 mg by mouth every evening. 11/23/18 11/23/19 Yes [provider]  Insulin Glargine (LANTUS SOLOSTAR) 100 UNIT/ML Solostar Pen Inject 15 Units into the skin daily. 11/30/18  Yes [provider]  insulin lispro (HUMALOG KWIKPEN) 100 UNIT/ML KwikPen Inject 5 Units into the skin 3 (three) times daily before meals. 11/30/18 11/30/19 Yes [provider]  lisinopril (ZESTRIL) 2.5 MG tablet Take 1 tablet by mouth daily at 8 pm. 06/02/19  Yes [provider]  metoprolol tartrate (LOPRESSOR) 25 MG tablet Take 25 mg by mouth daily. 11/23/18 11/23/19 Yes [provider]     VITAL SIGNS:  Blood pressure 130/62, pulse (!) 106, temperature 98.6 F (37 C), temperature source Oral, resp. rate 18, height 5' 6"  (1.676 m), weight 63.5 kg, SpO2 98 %.  PHYSICAL EXAMINATION:  Physical Exam  GENERAL:  58 y.o.-year-old patient lying in the bed with no acute  distress.  EYES: Pupils equal, round, reactive to light and accommodation. No scleral icterus. Extraocular muscles intact.  HEENT: Head atraumatic, normocephalic. Oropharynx and nasopharynx clear. No oropharyngeal erythema, dry oral mucosa  NECK:  Supple, no jugular venous distention. No thyroid enlargement, no tenderness.  LUNGS: Normal breath sounds bilaterally, no wheezing, rales,  rhonchi. No use of accessory muscles of respiration.  CARDIOVASCULAR: S1, S2 normal. No murmurs, rubs, or gallops.  ABDOMEN: Soft, nontender, nondistended. Bowel sounds present. No organomegaly or mass.  EXTREMITIES: No pedal edema, cyanosis, or clubbing. + 2 pedal & radial pulses b/l.   NEUROLOGIC: Cranial nerves II through XII are intact.  Left upper extremity weakness PSYCHIATRIC: The patient is alert and oriented x 3. Good affect.  SKIN: No obvious rash, lesion, or ulcer.   LABORATORY PANEL:   CBC Recent Labs  Lab 08/05/19 1401  WBC 7.5  HGB 9.1*  HCT 28.4*  PLT 88*   ------------------------------------------------------------------------------------------------------------------  Chemistries  Recent Labs  Lab 08/05/19 1401  NA 132*  K 5.0  CL 97*  CO2 15*  GLUCOSE 747*  BUN 60*  CREATININE 2.56*  CALCIUM 8.6*   ------------------------------------------------------------------------------------------------------------------  Cardiac Enzymes No results for input(s): TROPONINI in the last 168 hours. ------------------------------------------------------------------------------------------------------------------  RADIOLOGY:  No results found.   IMPRESSION AND PLAN:   *DKA.  Patient will be admitted with DKA protocol.  Every 1 hour Accu-Cheks.  Every 4 BMP.  Start insulin drip.  Bolus 2 L normal saline stat.  Will transition IV fluids once blood sugars improved.  Decompensated to DKA likely due to UTI.  Has not missed any doses.  Check hemoglobin A1c.  *Acute kidney injury over CKD stage III due to dehydration/DKA.  Start IV fluids.  Monitor input and output.  Repeat labs.  *UTI.  Start ceftriaxone.  Send urine cultures.  *Hypertension.  Continue home medications  *History of CVA.  Has chronic left upper extremity weakness.  Is on aspirin, Lipitor at home  *DVT prophylaxis with heparin subcutaneous  All the records are reviewed and case discussed with ED  provider. Management plans discussed with the patient, family and they are in agreement.  CODE STATUS: Full code  TOTAL CRITICAL CARE TIME TAKING CARE OF THIS PATIENT: 40 minutes.   Leia Alf Kiing Deakin M.D on 08/05/2019 at 4:10 PM  Between 7am to 6pm - Pager - [phone]  After 6pm go to [email or web address] - password [email or web address] [facility]  Office  [phone]  CC: Primary care physician; Rochel Brome, MD  Note: This dictation was prepared with Dragon dictation along with smaller phrase technology. Any transcriptional errors that result from this process are unintentional.

## 2019-08-06 LAB — BASIC METABOLIC PANEL
Anion gap: 6 (ref 5–15)
Anion gap: 7 (ref 5–15)
Anion gap: 9 (ref 5–15)
BUN: 54 mg/dL — ABNORMAL HIGH (ref 6–20)
BUN: 54 mg/dL — ABNORMAL HIGH (ref 6–20)
BUN: 54 mg/dL — ABNORMAL HIGH (ref 6–20)
CO2: 21 mmol/L — ABNORMAL LOW (ref 22–32)
CO2: 22 mmol/L (ref 22–32)
CO2: 23 mmol/L (ref 22–32)
Calcium: 8.3 mg/dL — ABNORMAL LOW (ref 8.9–10.3)
Calcium: 8.3 mg/dL — ABNORMAL LOW (ref 8.9–10.3)
Calcium: 8.6 mg/dL — ABNORMAL LOW (ref 8.9–10.3)
Chloride: 106 mmol/L (ref 98–111)
Chloride: 110 mmol/L (ref 98–111)
Chloride: 111 mmol/L (ref 98–111)
Creatinine, Ser: 1.92 mg/dL — ABNORMAL HIGH (ref 0.44–1.00)
Creatinine, Ser: 1.96 mg/dL — ABNORMAL HIGH (ref 0.44–1.00)
Creatinine, Ser: 2.31 mg/dL — ABNORMAL HIGH (ref 0.44–1.00)
GFR calc Af Amer: 26 mL/min — ABNORMAL LOW (ref >60)
GFR calc Af Amer: 32 mL/min — ABNORMAL LOW (ref >60)
GFR calc Af Amer: 32 mL/min — ABNORMAL LOW (ref >60)
GFR calc non Af Amer: 22 mL/min — ABNORMAL LOW (ref >60)
GFR calc non Af Amer: 27 mL/min — ABNORMAL LOW (ref >60)
GFR calc non Af Amer: 28 mL/min — ABNORMAL LOW (ref >60)
Glucose, Bld: 143 mg/dL — ABNORMAL HIGH (ref 70–99)
Glucose, Bld: 84 mg/dL (ref 70–99)
Glucose, Bld: 97 mg/dL (ref 70–99)
Potassium: 3.5 mmol/L (ref 3.5–5.1)
Potassium: 4.3 mmol/L (ref 3.5–5.1)
Potassium: 4.4 mmol/L (ref 3.5–5.1)
Sodium: 138 mmol/L (ref 135–145)
Sodium: 138 mmol/L (ref 135–145)
Sodium: 139 mmol/L (ref 135–145)

## 2019-08-06 LAB — GLUCOSE, CAPILLARY
Glucose-Capillary: 50 mg/dL — ABNORMAL LOW (ref 70–99)
Glucose-Capillary: 50 mg/dL — ABNORMAL LOW (ref 70–99)
Glucose-Capillary: 76 mg/dL (ref 70–99)
Glucose-Capillary: 79 mg/dL (ref 70–99)
Glucose-Capillary: 72 mg/dL (ref 70–99)
Glucose-Capillary: 79 mg/dL (ref 70–99)
Glucose-Capillary: 233 mg/dL — ABNORMAL HIGH (ref 70–99)
Glucose-Capillary: 155 mg/dL — ABNORMAL HIGH (ref 70–99)
Glucose-Capillary: 145 mg/dL — ABNORMAL HIGH (ref 70–99)

## 2019-08-06 LAB — HEMOGLOBIN A1C
Hgb A1c MFr Bld: 9.3 % — ABNORMAL HIGH (ref 4.8–5.6)
Mean Plasma Glucose: 220.21 mg/dL

## 2019-08-06 LAB — MAGNESIUM: Magnesium: 1.5 mg/dL — ABNORMAL LOW (ref 1.7–2.4)

## 2019-08-06 LAB — CBC
HCT: 27 % — ABNORMAL LOW (ref 36.0–46.0)
Hemoglobin: 9 g/dL — ABNORMAL LOW (ref 12.0–15.0)
MCH: 30.2 pg (ref 26.0–34.0)
MCHC: 33.3 g/dL (ref 30.0–36.0)
MCV: 90.6 fL (ref 80.0–100.0)
Platelets: 115 10*3/uL — ABNORMAL LOW (ref 150–400)
RBC: 2.98 MIL/uL — ABNORMAL LOW (ref 3.87–5.11)
RDW: 12.6 % (ref 11.5–15.5)
WBC: 8.8 10*3/uL (ref 4.0–10.5)
nRBC: 0 % (ref 0.0–0.2)

## 2019-08-06 LAB — PHOSPHORUS: Phosphorus: 1.8 mg/dL — ABNORMAL LOW (ref 2.5–4.6)

## 2019-08-06 MED ORDER — INSULIN ASPART 100 UNIT/ML ~~LOC~~ SOLN: 0.0000 [IU] | Freq: Every day | SUBCUTANEOUS | Status: DC

## 2019-08-06 MED ORDER — ENOXAPARIN SODIUM 30 MG/0.3ML ~~LOC~~ SOLN
30.0000 mg | SUBCUTANEOUS | Status: DC
  Administered 2019-08-06: 17:00:00 30 mg via SUBCUTANEOUS
  Filled 2019-08-06: qty 0.3

## 2019-08-06 MED ORDER — INSULIN GLARGINE 100 UNIT/ML ~~LOC~~ SOLN
10.0000 [IU] | Freq: Every day | SUBCUTANEOUS | Status: DC
  Filled 2019-08-06 (×2): qty 0.1

## 2019-08-06 MED ORDER — INSULIN ASPART 100 UNIT/ML ~~LOC~~ SOLN
0.0000 [IU] | Freq: Three times a day (TID) | SUBCUTANEOUS | Status: DC
  Administered 2019-08-06: 3 [IU] via SUBCUTANEOUS
  Administered 2019-08-06: 5 [IU] via SUBCUTANEOUS
  Administered 2019-08-07: 2 [IU] via SUBCUTANEOUS
  Filled 2019-08-06 (×3): qty 1

## 2019-08-06 MED ORDER — ENOXAPARIN SODIUM 40 MG/0.4ML ~~LOC~~ SOLN: 40.0000 mg | SUBCUTANEOUS | Status: DC

## 2019-08-06 MED ORDER — INSULIN ASPART 100 UNIT/ML ~~LOC~~ SOLN: 0.0000 [IU] | SUBCUTANEOUS | Status: DC

## 2019-08-06 MED ORDER — INSULIN GLARGINE 100 UNIT/ML ~~LOC~~ SOLN
15.0000 [IU] | Freq: Every day | SUBCUTANEOUS | Status: DC
  Administered 2019-08-06 – 2019-08-07 (×2): 15 [IU] via SUBCUTANEOUS
  Filled 2019-08-06 (×4): qty 0.15

## 2019-08-06 MED ORDER — POTASSIUM PHOSPHATES 15 MMOLE/5ML IV SOLN
30.0000 mmol | Freq: Once | INTRAVENOUS | Status: AC
  Administered 2019-08-06: 30 mmol via INTRAVENOUS
  Filled 2019-08-06: qty 10

## 2019-08-06 MED ORDER — MAGNESIUM SULFATE 2 GM/50ML IV SOLN
2.0000 g | Freq: Once | INTRAVENOUS | Status: AC
  Administered 2019-08-06: 2 g via INTRAVENOUS
  Filled 2019-08-06: qty 50

## 2019-08-06 NOTE — Progress Notes (Signed)
[facility] [place] NAME: Susan Navarro    MR#:  [id number]  DATE OF BIRTH:  1961-08-24  SUBJECTIVE:  CHIEF COMPLAINT:   Chief Complaint  Patient presents with  . Emesis   No new complaint this morning.  Blood sugars better controlled.  Patient already weaned off insulin drip.  Plans for transfer out of ICU once bed available.  REVIEW OF SYSTEMS:  Review of Systems  Constitutional: Negative for chills and fever.  HENT: Negative for hearing loss and tinnitus.   Eyes: Negative for blurred vision and double vision.  Respiratory: Negative for cough and sputum production.   Cardiovascular: Negative for chest pain and palpitations.  Gastrointestinal: Negative for abdominal pain, heartburn, nausea and vomiting.  Genitourinary: Negative for dysuria and urgency.  Musculoskeletal: Negative for myalgias and neck pain.  Skin: Negative for itching and rash.  Neurological: Negative for dizziness and headaches.  Psychiatric/Behavioral: Negative for depression and hallucinations.    DRUG ALLERGIES:  No Known Allergies VITALS:  Blood pressure 109/60, pulse 72, temperature 98.6 F (37 C), temperature source Axillary, resp. rate 16, height 5' 6"  (1.676 m), weight 63 kg, SpO2 97 %. PHYSICAL EXAMINATION:  Physical Exam  GENERAL:  58 y.o.-year-old patient lying in the bed with no acute distress.  EYES: Pupils equal, round, reactive to light and accommodation. No scleral icterus. Extraocular muscles intact.  HEENT: Head atraumatic, normocephalic. Oropharynx and nasopharynx clear. No oropharyngeal erythema, dry oral mucosa  NECK:  Supple, no jugular venous distention. No thyroid enlargement, no tenderness.  LUNGS: Normal breath sounds bilaterally, no wheezing, rales, rhonchi. No use of accessory muscles of respiration.  CARDIOVASCULAR: S1, S2 normal. No murmurs, rubs, or gallops.  ABDOMEN: Soft, nontender, nondistended. Bowel sounds present. No  organomegaly or mass.  EXTREMITIES: No pedal edema, cyanosis, or clubbing. + 2 pedal & radial pulses b/l.   NEUROLOGIC: Cranial nerves II through XII are intact.  Left upper extremity weakness PSYCHIATRIC: The patient is alert and oriented x 3. Good affect.  SKIN: No obvious rash, lesion, or ulcer.  LABORATORY PANEL:  Female CBC Recent Labs  Lab 08/06/19 0445  WBC 8.8  HGB 9.0*  HCT 27.0*  PLT 115*   ------------------------------------------------------------------------------------------------------------------ Chemistries  Recent Labs  Lab 08/05/19 2358 08/06/19 0445  NA 139 138  K 3.5 4.3  CL 111 110  CO2 21* 22  GLUCOSE 97 84  BUN 54* 54*  CREATININE 1.96* 1.92*  CALCIUM 8.3* 8.3*  MG 1.5*  --    RADIOLOGY:  No results found. ASSESSMENT AND PLAN:    *DKA.   Patient was initially admitted to the ICU.  DKA resolved already.  Patient already weaned off insulin drip.  Hydrated with IV fluids.  Glycosylated hemoglobin level of 9.3.   Patient claims to be compliant with her insulin regimen.  Currently on sliding scale insulin coverage.  Patient reported to have had a low blood sugar last night.  Most recent blood sugar of 233.   Patient is on Lantus insulin 15 units daily.  Will resume and monitor blood sugars closely  Transfer out of ICU once bed available  *Acute kidney injury over CKD stage III due to dehydration/DKA.  Renal function improving with IV fluids.  Monitor.  *UTI.  Start ceftriaxone.  Follow-up on urine culture and sensitivities.  *Hypertension.  Continue home medications  *History of CVA.  Has chronic left upper extremity weakness.  Is on aspirin, Lipitor at home  *DVT prophylaxis;  Lovenox  All the records are reviewed and case discussed with Care Management/Social Worker. Management plans discussed with the patient, family and they are in agreement.  CODE STATUS: Full Code  TOTAL TIME TAKING CARE OF THIS PATIENT: 37 minutes.   More than  50% of the time was spent in counseling/coordination of care: YES  POSSIBLE D/C IN 2 DAYS, DEPENDING ON CLINICAL CONDITION.   Forest Redwine M.D on 08/06/2019 at 2:48 PM  Between 7am to 6pm - Pager - [phone]  After 6pm go to [email or web address] - password [email or web address] [facility]  [facility]  Office  [phone]  CC: Primary care physician; Rochel Brome, MD  Note: This dictation was prepared with Dragon dictation along with smaller phrase technology. Any transcriptional errors that result from this process are unintentional.

## 2019-08-06 NOTE — Progress Notes (Signed)
Anticoagulation monitoring(Lovenox):  58 yo  female ordered Lovenox 40 mg Q24h  Filed Weights   08/05/19 1351 08/05/19 1826  Weight: 140 lb (63.5 kg) 138 lb 14.2 oz (63 kg)   Body mass index is 22.42 kg/m.   Lab Results  Component Value Date   CREATININE 1.92 (H) 08/06/2019   CREATININE 1.96 (H) 08/05/2019   CREATININE 2.08 (H) 08/05/2019   Estimated Creatinine Clearance: 29.5 mL/min (A) (by C-G formula based on SCr of 1.92 mg/dL (H)). Hemoglobin & Hematocrit     Component Value Date/Time   HGB 9.0 (L) 08/06/2019 0445   HCT 27.0 (L) 08/06/2019 0445     Per Protocol for Patient with estCrcl < 30 ml/min and BMI < 40, will transition to Lovenox 30 mg Q24h.

## 2019-08-06 NOTE — Progress Notes (Signed)
Patient was admitted to ICU/SDU with DKA on insulin infusion.  By the time I saw her this morning, she had been transitioned off of continuous insulin.  She has transfer orders placed for MedSurg bed.  PCCM service will sign off.  Merton Border, MD PCCM service Mobile [phone] Pager [phone] 08/06/2019 11:25 AM

## 2019-08-06 NOTE — Progress Notes (Signed)
Patient to move to room 128. Report called to [place] RN on 1C. Patient aware of transfer and per pt request this RN called her mother and daughter and updated them about transfer. Patient currently eating dinner and will be physically moved to her new room when she finishes.

## 2019-08-07 LAB — BASIC METABOLIC PANEL
Anion gap: 7 (ref 5–15)
BUN: 52 mg/dL — ABNORMAL HIGH (ref 6–20)
CO2: 22 mmol/L (ref 22–32)
Calcium: 8.6 mg/dL — ABNORMAL LOW (ref 8.9–10.3)
Chloride: 111 mmol/L (ref 98–111)
Creatinine, Ser: 2.1 mg/dL — ABNORMAL HIGH (ref 0.44–1.00)
GFR calc Af Amer: 29 mL/min — ABNORMAL LOW (ref >60)
GFR calc non Af Amer: 25 mL/min — ABNORMAL LOW (ref >60)
Glucose, Bld: 136 mg/dL — ABNORMAL HIGH (ref 70–99)
Potassium: 4.3 mmol/L (ref 3.5–5.1)
Sodium: 140 mmol/L (ref 135–145)

## 2019-08-07 LAB — URINE CULTURE: Culture: 30000 — AB

## 2019-08-07 LAB — GLUCOSE, CAPILLARY
Glucose-Capillary: 129 mg/dL — ABNORMAL HIGH (ref 70–99)
Glucose-Capillary: 92 mg/dL (ref 70–99)

## 2019-08-07 LAB — MAGNESIUM: Magnesium: 1.9 mg/dL (ref 1.7–2.4)

## 2019-08-07 MED ORDER — ENOXAPARIN SODIUM 30 MG/0.3ML ~~LOC~~ SOLN: 30.0000 mg | SUBCUTANEOUS | Status: DC

## 2019-08-07 NOTE — Discharge Summary (Signed)
[facility] at [place] NAME: Susan Navarro    MR#:  [id number]  DATE OF BIRTH:  Jan 09, 1961  DATE OF ADMISSION:  08/05/2019   ADMITTING PHYSICIAN: Hillary Bow, MD  DATE OF DISCHARGE: 08/07/2019  PRIMARY CARE PHYSICIAN: Rochel Brome, MD   ADMISSION DIAGNOSIS:  Lower urinary tract infectious disease [N39.0] Diabetic ketoacidosis without coma associated with diabetes mellitus due to underlying condition ([place]) [E08.10] Vomiting, intractability of vomiting not specified, presence of nausea not specified, unspecified vomiting type [R11.10] DISCHARGE DIAGNOSIS:  Active Problems:   DKA (diabetic ketoacidoses) ([place])  SECONDARY DIAGNOSIS:   Past Medical History:  Diagnosis Date   Anemia    Chronic kidney disease    Diabetes mellitus without complication ([place])    Hypertension    Stroke ([place]) 03/2018   Ischemic stroke per [place]:  Chief complaint; vomiting  History of presenting complaint; Susan Navarro  is a 58 y.o. female with a known history of hypertension, diabetes mellitus, CVA, CKD3 who presented with vomiting and high blood sugars.  Patient was diagnosed with DKA and admitted to the ICU for further evaluation and management.   Hospital course; *DKA.  Patient was initially admitted to the ICU.  DKA resolved already.  Patient weaned off insulin drip and started on subacute insulin.  Adequately hydrated with IV fluids.  Blood sugars better controlled. Glycosylated hemoglobin level of 9.3.  Patient claims to be compliant with her insulin regimen and has her insulin at home.  Continue the same on discharge.  Follow-up with primary care physician for ongoing monitoring of blood sugars.  *Acute kidney injury over CKD stage III due to dehydration/DKA.  Renal function improved and fairly stable.  *UTI. Patient was started on IV Rocephin.  Urine culture came back suggestive of contaminant.  Patient  remains asymptomatic.  No indication to continue antibiotics at this time  *Hypertension.  Blood pressure controlled. Continue home medications  *History of CVA. Has chronic left upper extremity weakness. Is on aspirin, Lipitor at home  Patient clinically and hemodynamically stable and wishes to be discharged home.  Follow-up with primary care physician. DISCHARGE CONDITIONS:  Stable CONSULTS OBTAINED:   DRUG ALLERGIES:  No Known Allergies DISCHARGE MEDICATIONS:   Allergies as of 08/07/2019   No Known Allergies     Medication List    TAKE these medications   amLODipine 5 MG tablet Commonly known as: NORVASC Take 1 tablet by mouth daily at 8 pm.   aspirin EC 81 MG tablet Take 81 mg by mouth daily.   atorvastatin 80 MG tablet Commonly known as: LIPITOR Take 80 mg by mouth every evening.   HumaLOG KwikPen 100 UNIT/ML KwikPen Generic drug: insulin lispro Inject 5 Units into the skin 3 (three) times daily before meals.   Lantus SoloStar 100 UNIT/ML Solostar Pen Generic drug: Insulin Glargine Inject 15 Units into the skin daily.   lisinopril 2.5 MG tablet Commonly known as: ZESTRIL Take 1 tablet by mouth daily at 8 pm.   metoprolol tartrate 25 MG tablet Commonly known as: LOPRESSOR Take 25 mg by mouth daily.        DISCHARGE INSTRUCTIONS:   DIET:  Diabetic diet DISCHARGE CONDITION:  Stable ACTIVITY:  Activity as tolerated OXYGEN:  Home Oxygen: No.  Oxygen Delivery: room air DISCHARGE LOCATION:  home   If you experience worsening of your admission symptoms, develop shortness of breath, life threatening emergency, suicidal or homicidal thoughts you must  seek medical attention immediately by calling 911 or calling your MD immediately  if symptoms less severe.  You Must read complete instructions/literature along with all the possible adverse reactions/side effects for all the Medicines you take and that have been prescribed to you. Take any new  Medicines after you have completely understood and accpet all the possible adverse reactions/side effects.   Please note  You were cared for by a hospitalist during your hospital stay. If you have any questions about your discharge medications or the care you received while you were in the hospital after you are discharged, you can call the unit and asked to speak with the hospitalist on call if the hospitalist that took care of you is not available. Once you are discharged, your primary care physician will handle any further medical issues. Please note that NO REFILLS for any discharge medications will be authorized once you are discharged, as it is imperative that you return to your primary care physician (or establish a relationship with a primary care physician if you do not have one) for your aftercare needs so that they can reassess your need for medications and monitor your lab values.    On the day of Discharge:  VITAL SIGNS:  Blood pressure 131/66, pulse 79, temperature 97.8 F (36.6 C), temperature source Oral, resp. rate 16, height 5' 6"  (1.676 m), weight 63 kg, SpO2 100 %. PHYSICAL EXAMINATION:  GENERAL:  58 y.o.-year-old patient lying in the bed with no acute distress.  EYES: Pupils equal, round, reactive to light and accommodation. No scleral icterus. Extraocular muscles intact.  HEENT: Head atraumatic, normocephalic. Oropharynx and nasopharynx clear.  NECK:  Supple, no jugular venous distention. No thyroid enlargement, no tenderness.  LUNGS: Normal breath sounds bilaterally, no wheezing, rales,rhonchi or crepitation. No use of accessory muscles of respiration.  CARDIOVASCULAR: S1, S2 normal. No murmurs, rubs, or gallops.  ABDOMEN: Soft, non-tender, non-distended. Bowel sounds present. No organomegaly or mass.  EXTREMITIES: No pedal edema, cyanosis, or clubbing.  NEUROLOGIC: Cranial nerves II through XII are intact. Muscle strength 5/5 in all extremities. Sensation intact. Gait not  checked.  PSYCHIATRIC: The patient is alert and oriented x 3.  SKIN: No obvious rash, lesion, or ulcer.  DATA REVIEW:   CBC Recent Labs  Lab 08/06/19 0445  WBC 8.8  HGB 9.0*  HCT 27.0*  PLT 115*    Chemistries  Recent Labs  Lab 08/07/19 0500  NA 140  K 4.3  CL 111  CO2 22  GLUCOSE 136*  BUN 52*  CREATININE 2.10*  CALCIUM 8.6*  MG 1.9     Microbiology Results  Results for orders placed or performed during the hospital encounter of 08/05/19  SARS Coronavirus 2 [facility] order, Performed in [facility] hospital lab) Nasopharyngeal Nasopharyngeal Swab     Status: None   Collection Time: 08/05/19  2:57 PM   Specimen: Nasopharyngeal Swab  Result Value Ref Range Status   SARS Coronavirus 2 NEGATIVE NEGATIVE Final    Comment: (NOTE) If result is NEGATIVE SARS-CoV-2 target nucleic acids are NOT DETECTED. The SARS-CoV-2 RNA is generally detectable in upper and lower  respiratory specimens during the acute phase of infection. The lowest  concentration of SARS-CoV-2 viral copies this assay can detect is 250  copies / mL. A negative result does not preclude SARS-CoV-2 infection  and should not be used as the sole basis for treatment or other  patient management decisions.  A negative result may occur with  improper specimen  collection / handling, submission of specimen other  than nasopharyngeal swab, presence of viral mutation(s) within the  areas targeted by this assay, and inadequate number of viral copies  (<250 copies / mL). A negative result must be combined with clinical  observations, patient history, and epidemiological information. If result is POSITIVE SARS-CoV-2 target nucleic acids are DETECTED. The SARS-CoV-2 RNA is generally detectable in upper and lower  respiratory specimens dur ing the acute phase of infection.  Positive  results are indicative of active infection with SARS-CoV-2.  Clinical  correlation with patient history and other diagnostic  information is  necessary to determine patient infection status.  Positive results do  not rule out bacterial infection or co-infection with other viruses. If result is PRESUMPTIVE POSTIVE SARS-CoV-2 nucleic acids MAY BE PRESENT.   A presumptive positive result was obtained on the submitted specimen  and confirmed on repeat testing.  While 2019 novel coronavirus  (SARS-CoV-2) nucleic acids may be present in the submitted sample  additional confirmatory testing may be necessary for epidemiological  and / or clinical management purposes  to differentiate between  SARS-CoV-2 and other Sarbecovirus currently known to infect humans.  If clinically indicated additional testing with an alternate test  methodology [phone]) is advised. The SARS-CoV-2 RNA is generally  detectable in upper and lower respiratory sp ecimens during the acute  phase of infection. The expected result is Negative. Fact Sheet for Patients:  StrictlyIdeas.no Fact Sheet for Healthcare Providers: BankingDealers.co.za This test is not yet approved or cleared by the [place] FDA and has been authorized for detection and/or diagnosis of SARS-CoV-2 by FDA under an Emergency Use Authorization (EUA).  This EUA will remain in effect (meaning this test can be used) for the duration of the COVID-19 declaration under Section 564(b)(1) of the Act, 21 U.S.C. section 360bbb-3(b)(1), unless the authorization is terminated or revoked sooner. Performed at [facility], [address] [place], [place] [address]   MRSA PCR Screening     Status: None   Collection Time: 08/05/19  6:44 PM   Specimen: Nasopharyngeal  Result Value Ref Range Status   MRSA by PCR NEGATIVE NEGATIVE Final    Comment:        The GeneXpert MRSA Assay (FDA approved for NASAL specimens only), is one component of a comprehensive MRSA colonization surveillance program. It is not intended to diagnose  MRSA infection nor to guide or monitor treatment for MRSA infections. Performed at [facility], [address] [place], [place] [address]   Urine Culture     Status: Abnormal   Collection Time: 08/05/19  9:09 PM   Specimen: Urine, Random  Result Value Ref Range Status   Specimen Description   Final    URINE, RANDOM Performed at [facility], [address], [place], [place] [address]    Special Requests   Final    NONE Performed at [facility], [address] [place], [place] [address]    Culture (A)  Final    30,000 COLONIES/mL MULTIPLE SPECIES PRESENT, SUGGEST RECOLLECTION   Report Status 08/07/2019 FINAL  Final  Chlamydia/NGC rt PCR ([place] only)     Status: None   Collection Time: 08/05/19  9:09 PM   Specimen: Urine  Result Value Ref Range Status   Specimen source GC/Chlam URINE, RANDOM  Final   Chlamydia Tr NOT DETECTED NOT DETECTED Final   N gonorrhoeae NOT DETECTED NOT DETECTED Final    Comment: (NOTE) This CT/NG assay has  not been evaluated in patients with a history of  hysterectomy. Performed at [facility], [address] [place], [place] [address]     RADIOLOGY:  No results found.   Management plans discussed with the patient, family and they are in agreement.  CODE STATUS: Full Code   TOTAL TIME TAKING CARE OF THIS PATIENT: 36 minutes.    Sameul Tagle M.D on 08/07/2019 at 9:52 AM  Between 7am to 6pm - Pager - [phone]  After 6pm go to [email or web address] - password [email or web address] [facility]  [facility] [place]  Office  [phone]  CC: Primary care physician; Rochel Brome, MD   Note: This dictation was prepared with Dragon dictation along with smaller phrase technology. Any transcriptional errors that result from this process are unintentional.

## 2019-08-07 NOTE — Progress Notes (Signed)
Patient discharged. Madlyn Frankel, RN

## 2019-08-07 NOTE — Progress Notes (Signed)
Discharge instructions given and went over with patient at bedside. All questions answered.   Awaiting voucher for taxi from [place]. Madlyn Frankel, RN

## 2019-08-08 LAB — GLUCOSE, CAPILLARY: Glucose-Capillary: 599 mg/dL (ref 70–99)

## 2019-08-09 IMAGING — US US RENAL
1 series · 14 of 25 positions shown · non-contrast
Comparison: None.

CLINICAL DATA: Hydronephrosis

EXAM:
RENAL / URINARY TRACT ULTRASOUND COMPLETE

[Series 1: us renal · 0.20mm/px · 50 acquisitions, 14 frames shown]
[im 1/50]
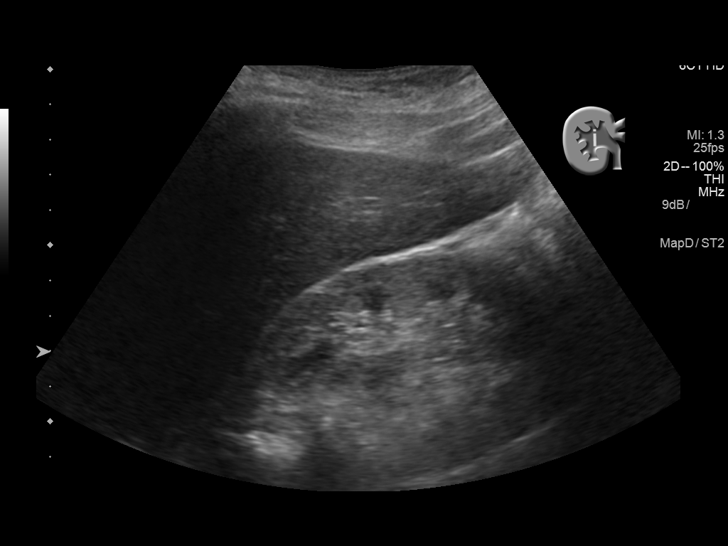
[im 5/50]
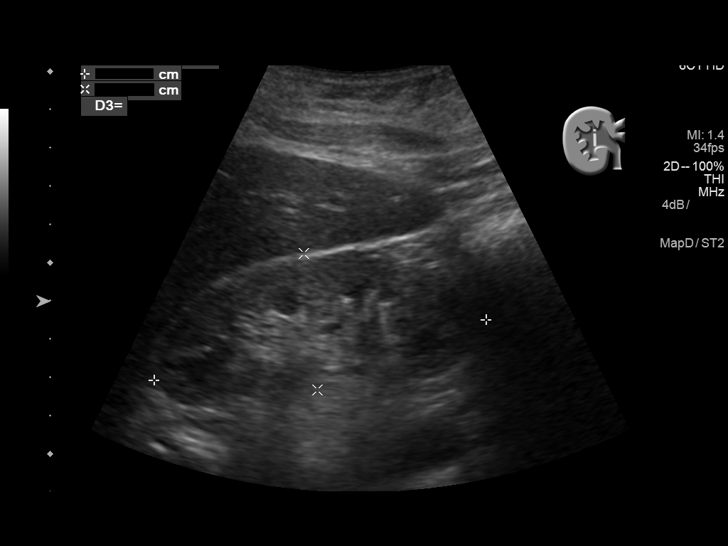
[im 9/50]
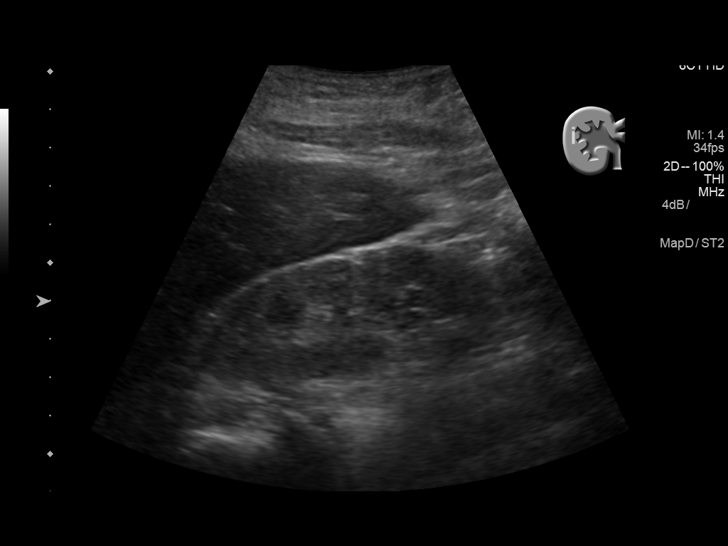
[im 13/50]
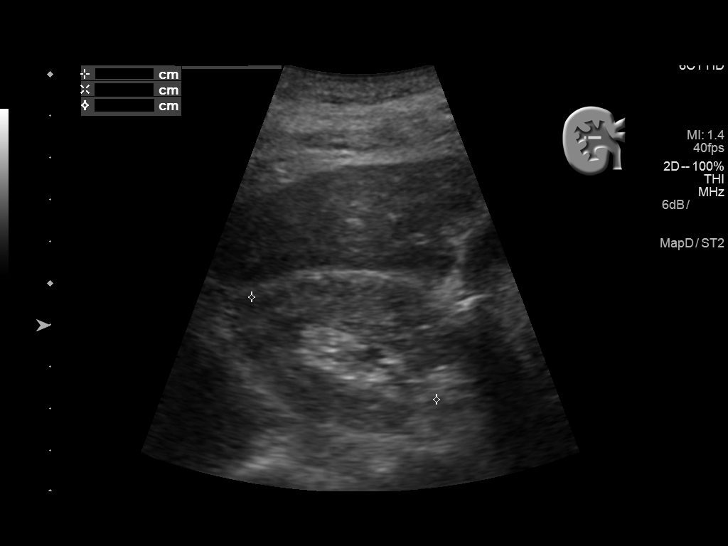
[im 17/50]
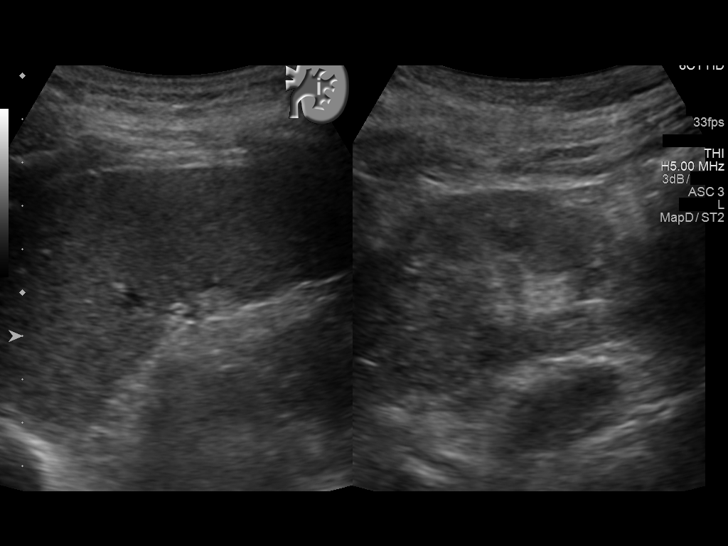
[im 19/50]
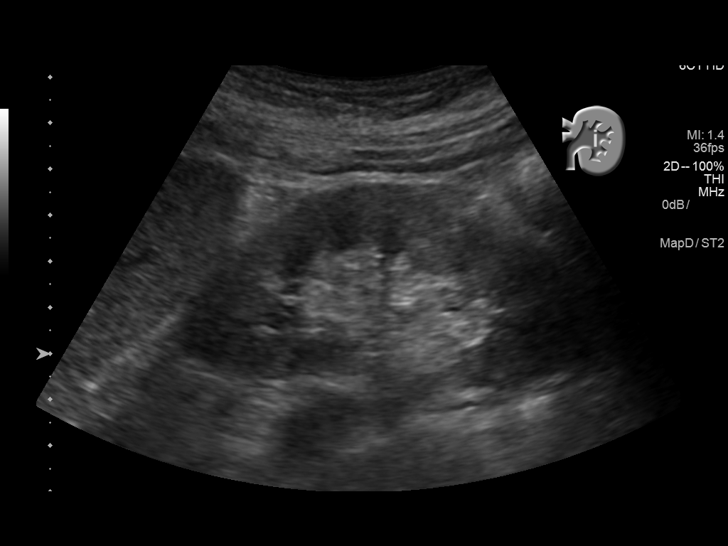
[im 23/50]
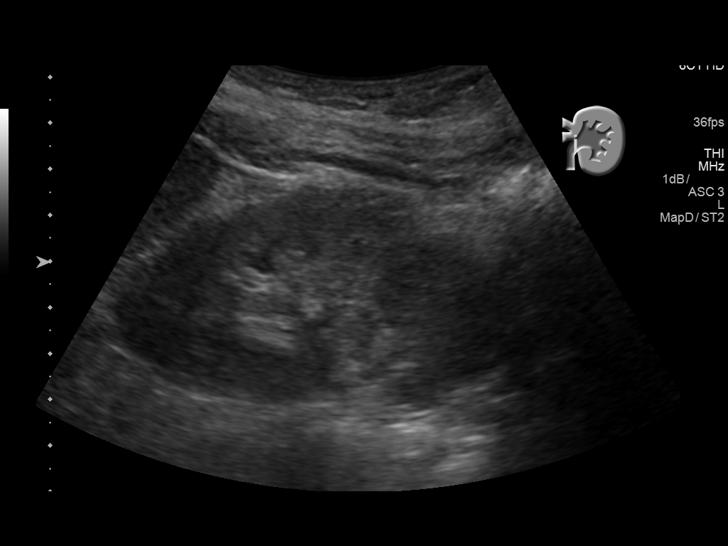
[im 27/50]
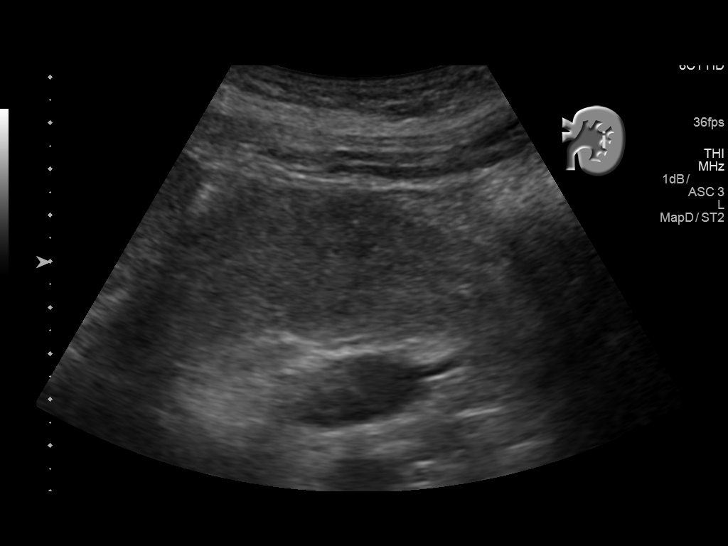
[im 31/50]
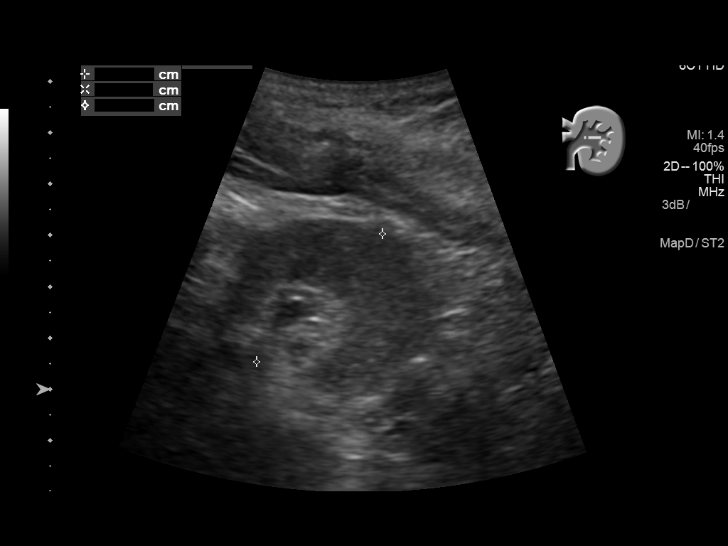
[im 33/50]
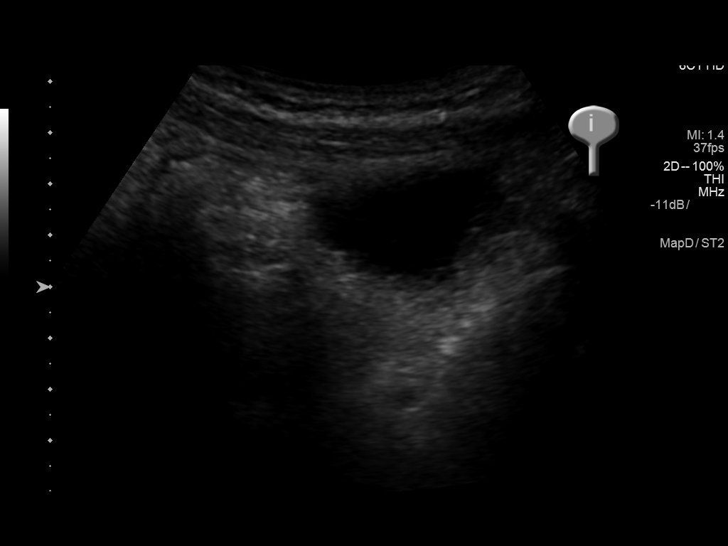
[im 37/50]
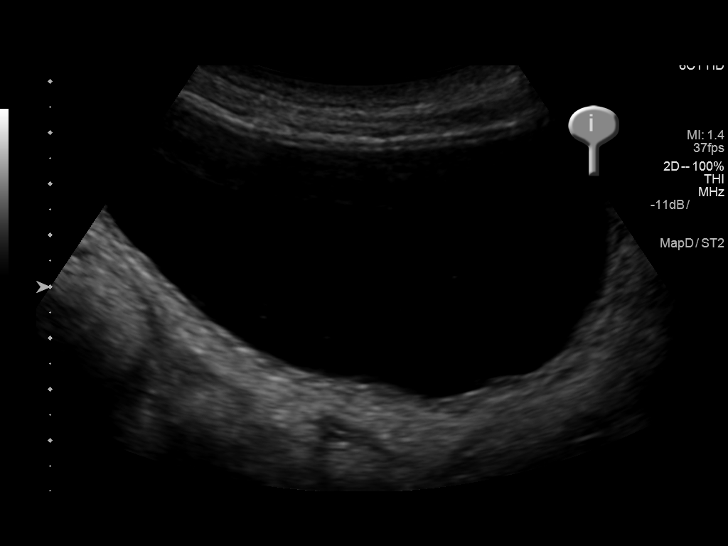
[im 41/50]
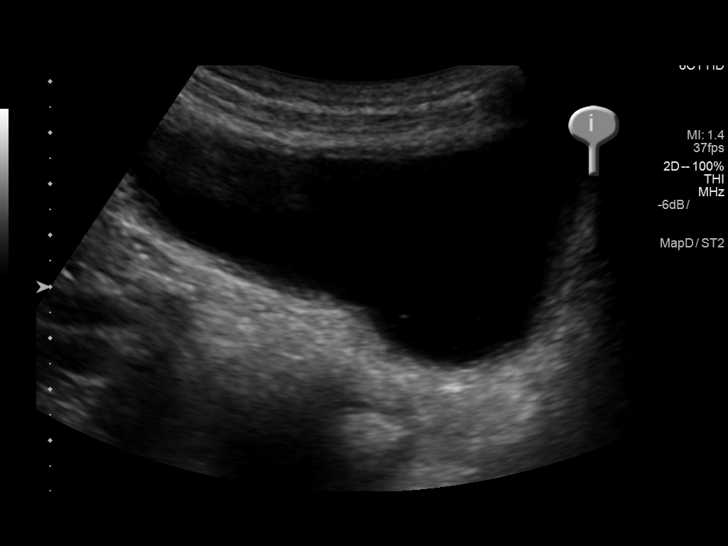
[im 45/50]
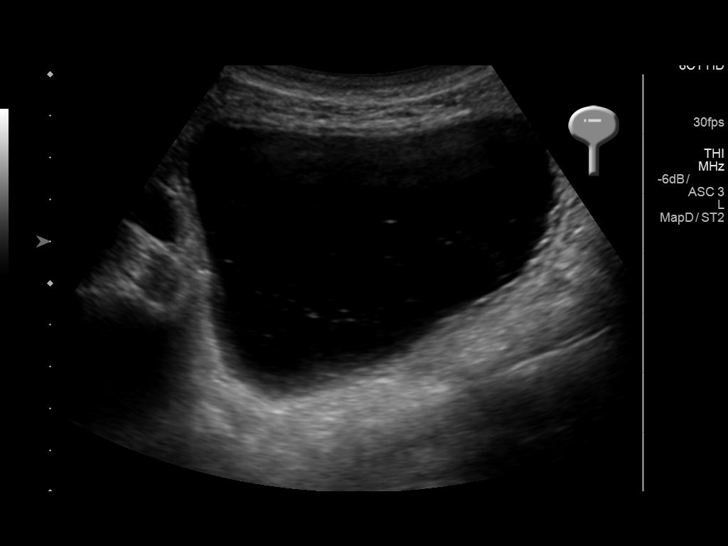
[im 50/50]
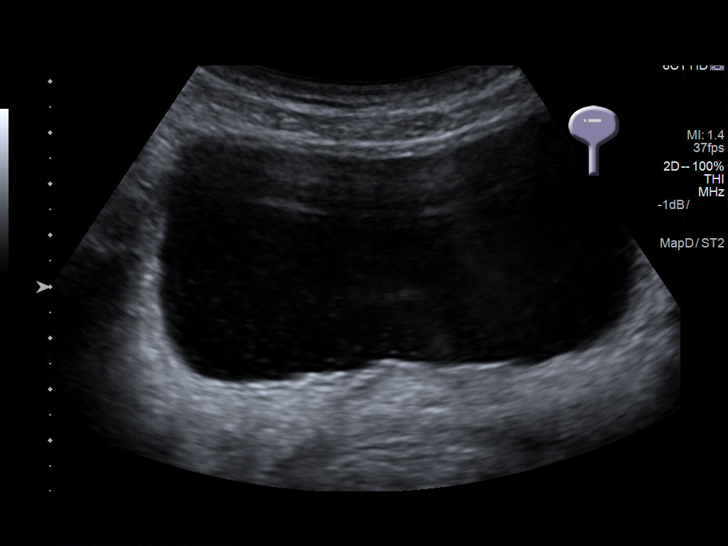

[14 of 25 positions shown; findings below may reference images not displayed]

FINDINGS: Right Kidney:

Renal measurements: 8.8 x 3.6 x 5.1 cm = volume: 84 mL. Slightly
increased echotexture throughout the right kidney. No mass or
hydronephrosis.

Left Kidney:

Renal measurements: 9.1 x 4.1 x 3.5 cm = volume: 69 mL. Slight
increased echotexture throughout the left kidney. No mass or
hydronephrosis.

Bladder:

Appears normal for degree of bladder distention.
IMPRESSION: Slight increased echotexture bilaterally suggesting chronic medical
renal disease. No acute findings. No hydronephrosis.

## 2019-08-23 LAB — BLOOD GAS, VENOUS
Patient temperature: 37
pCO2, Ven: 35 mmHg — ABNORMAL LOW (ref 44.0–60.0)
pH, Ven: 7.29 (ref 7.250–7.430)

## 2019-10-07 ENCOUNTER — Other Ambulatory Visit: Payer: Self-pay

## 2019-10-07 ENCOUNTER — Emergency Department: Payer: Medicare HMO

## 2019-10-07 ENCOUNTER — Encounter: Payer: Self-pay | Admitting: Emergency Medicine

## 2019-10-07 ENCOUNTER — Observation Stay
Admission: EM | Admit: 2019-10-07 | Discharge: 2019-10-08 | Disposition: A | Discharge status: Home / Self Care | Payer: Medicare HMO | Attending: Internal Medicine | Admitting: Internal Medicine

## 2019-10-07 DIAGNOSIS — K529 Noninfective gastroenteritis and colitis, unspecified: Secondary | ICD-10-CM | POA: Insufficient documentation

## 2019-10-07 DIAGNOSIS — Z20828 Contact with and (suspected) exposure to other viral communicable diseases: Secondary | ICD-10-CM | POA: Diagnosis not present

## 2019-10-07 DIAGNOSIS — E785 Hyperlipidemia, unspecified: Secondary | ICD-10-CM | POA: Diagnosis not present

## 2019-10-07 DIAGNOSIS — E860 Dehydration: Secondary | ICD-10-CM | POA: Diagnosis not present

## 2019-10-07 DIAGNOSIS — E875 Hyperkalemia: Secondary | ICD-10-CM | POA: Diagnosis not present

## 2019-10-07 DIAGNOSIS — N184 Chronic kidney disease, stage 4 (severe): Secondary | ICD-10-CM | POA: Insufficient documentation

## 2019-10-07 DIAGNOSIS — N179 Acute kidney failure, unspecified: Secondary | ICD-10-CM | POA: Insufficient documentation

## 2019-10-07 DIAGNOSIS — E1100 Type 2 diabetes mellitus with hyperosmolarity without nonketotic hyperglycemic-hyperosmolar coma (NKHHC): Secondary | ICD-10-CM | POA: Diagnosis present

## 2019-10-07 DIAGNOSIS — E1122 Type 2 diabetes mellitus with diabetic chronic kidney disease: Secondary | ICD-10-CM | POA: Insufficient documentation

## 2019-10-07 DIAGNOSIS — E871 Hypo-osmolality and hyponatremia: Secondary | ICD-10-CM | POA: Diagnosis not present

## 2019-10-07 DIAGNOSIS — Z7982 Long term (current) use of aspirin: Secondary | ICD-10-CM | POA: Diagnosis not present

## 2019-10-07 DIAGNOSIS — I129 Hypertensive chronic kidney disease with stage 1 through stage 4 chronic kidney disease, or unspecified chronic kidney disease: Secondary | ICD-10-CM | POA: Diagnosis not present

## 2019-10-07 DIAGNOSIS — Z79899 Other long term (current) drug therapy: Secondary | ICD-10-CM | POA: Insufficient documentation

## 2019-10-07 DIAGNOSIS — Z8673 Personal history of transient ischemic attack (TIA), and cerebral infarction without residual deficits: Secondary | ICD-10-CM | POA: Insufficient documentation

## 2019-10-07 DIAGNOSIS — E0810 Diabetes mellitus due to underlying condition with ketoacidosis without coma: Secondary | ICD-10-CM

## 2019-10-07 DIAGNOSIS — Z9111 Patient's noncompliance with dietary regimen: Secondary | ICD-10-CM | POA: Diagnosis not present

## 2019-10-07 DIAGNOSIS — Z794 Long term (current) use of insulin: Secondary | ICD-10-CM | POA: Diagnosis not present

## 2019-10-07 DIAGNOSIS — E1110 Type 2 diabetes mellitus with ketoacidosis without coma: Principal | ICD-10-CM | POA: Insufficient documentation

## 2019-10-07 LAB — CBC
HCT: 25.7 % — ABNORMAL LOW (ref 36.0–46.0)
Hemoglobin: 8.3 g/dL — ABNORMAL LOW (ref 12.0–15.0)
MCH: 29.7 pg (ref 26.0–34.0)
MCHC: 32.3 g/dL (ref 30.0–36.0)
MCV: 92.1 fL (ref 80.0–100.0)
Platelets: 71 10*3/uL — ABNORMAL LOW (ref 150–400)
RBC: 2.79 MIL/uL — ABNORMAL LOW (ref 3.87–5.11)
RDW: 12.3 % (ref 11.5–15.5)
WBC: 6.4 10*3/uL (ref 4.0–10.5)
nRBC: 0 % (ref 0.0–0.2)

## 2019-10-07 LAB — COMPREHENSIVE METABOLIC PANEL
ALT: 66 U/L — ABNORMAL HIGH (ref 0–44)
AST: 31 U/L (ref 15–41)
Albumin: 3.5 g/dL (ref 3.5–5.0)
Alkaline Phosphatase: 347 U/L — ABNORMAL HIGH (ref 38–126)
Anion gap: 20 — ABNORMAL HIGH (ref 5–15)
BUN: 78 mg/dL — ABNORMAL HIGH (ref 6–20)
CO2: 18 mmol/L — ABNORMAL LOW (ref 22–32)
Calcium: 9 mg/dL (ref 8.9–10.3)
Chloride: 89 mmol/L — ABNORMAL LOW (ref 98–111)
Creatinine, Ser: 2.84 mg/dL — ABNORMAL HIGH (ref 0.44–1.00)
GFR calc Af Amer: 20 mL/min — ABNORMAL LOW (ref >60)
GFR calc non Af Amer: 17 mL/min — ABNORMAL LOW (ref >60)
Glucose, Bld: 1015 mg/dL (ref 70–99)
Potassium: 5.4 mmol/L — ABNORMAL HIGH (ref 3.5–5.1)
Sodium: 127 mmol/L — ABNORMAL LOW (ref 135–145)
Total Bilirubin: 1.4 mg/dL — ABNORMAL HIGH (ref 0.3–1.2)
Total Protein: 5.9 g/dL — ABNORMAL LOW (ref 6.5–8.1)

## 2019-10-07 LAB — GLUCOSE, CAPILLARY
Glucose-Capillary: >600 mg/dL (ref 70–99)
Glucose-Capillary: >600 mg/dL (ref 70–99)
Glucose-Capillary: >600 mg/dL (ref 70–99)
Glucose-Capillary: >600 mg/dL (ref 70–99)
Glucose-Capillary: >600 mg/dL (ref 70–99)
Glucose-Capillary: 478 mg/dL — ABNORMAL HIGH (ref 70–99)
Glucose-Capillary: 432 mg/dL — ABNORMAL HIGH (ref 70–99)
Glucose-Capillary: 324 mg/dL — ABNORMAL HIGH (ref 70–99)
Glucose-Capillary: 180 mg/dL — ABNORMAL HIGH (ref 70–99)

## 2019-10-07 LAB — BLOOD GAS, VENOUS
Acid-base deficit: 8.8 mmol/L — ABNORMAL HIGH (ref 0.0–2.0)
Bicarbonate: 16.7 mmol/L — ABNORMAL LOW (ref 20.0–28.0)
O2 Saturation: 71.3 %
Patient temperature: 37
pCO2, Ven: 34 mmHg — ABNORMAL LOW (ref 44.0–60.0)
pH, Ven: 7.3 (ref 7.250–7.430)
pO2, Ven: 42 mmHg (ref 32.0–45.0)

## 2019-10-07 LAB — C DIFFICILE QUICK SCREEN W PCR REFLEX
C Diff antigen: NEGATIVE
C Diff interpretation: NOT DETECTED
C Diff toxin: NEGATIVE

## 2019-10-07 LAB — PHOSPHORUS: Phosphorus: 2.9 mg/dL (ref 2.5–4.6)

## 2019-10-07 LAB — BASIC METABOLIC PANEL
Anion gap: 10 (ref 5–15)
BUN: 78 mg/dL — ABNORMAL HIGH (ref 6–20)
CO2: 22 mmol/L (ref 22–32)
Calcium: 8.6 mg/dL — ABNORMAL LOW (ref 8.9–10.3)
Chloride: 101 mmol/L (ref 98–111)
Creatinine, Ser: 2.75 mg/dL — ABNORMAL HIGH (ref 0.44–1.00)
GFR calc Af Amer: 21 mL/min — ABNORMAL LOW (ref >60)
GFR calc non Af Amer: 18 mL/min — ABNORMAL LOW (ref >60)
Glucose, Bld: 687 mg/dL (ref 70–99)
Potassium: 3.7 mmol/L (ref 3.5–5.1)
Sodium: 133 mmol/L — ABNORMAL LOW (ref 135–145)

## 2019-10-07 LAB — MAGNESIUM: Magnesium: 2.3 mg/dL (ref 1.7–2.4)

## 2019-10-07 LAB — LIPASE, BLOOD: Lipase: 27 U/L (ref 11–51)

## 2019-10-07 IMAGING — DX DG CHEST 1V PORT
1 series · 1 of 1 positions shown · non-contrast
Comparison: [DATE]

CLINICAL DATA: High blood sugar

EXAM:
PORTABLE CHEST 1 VIEW

[chest ap]
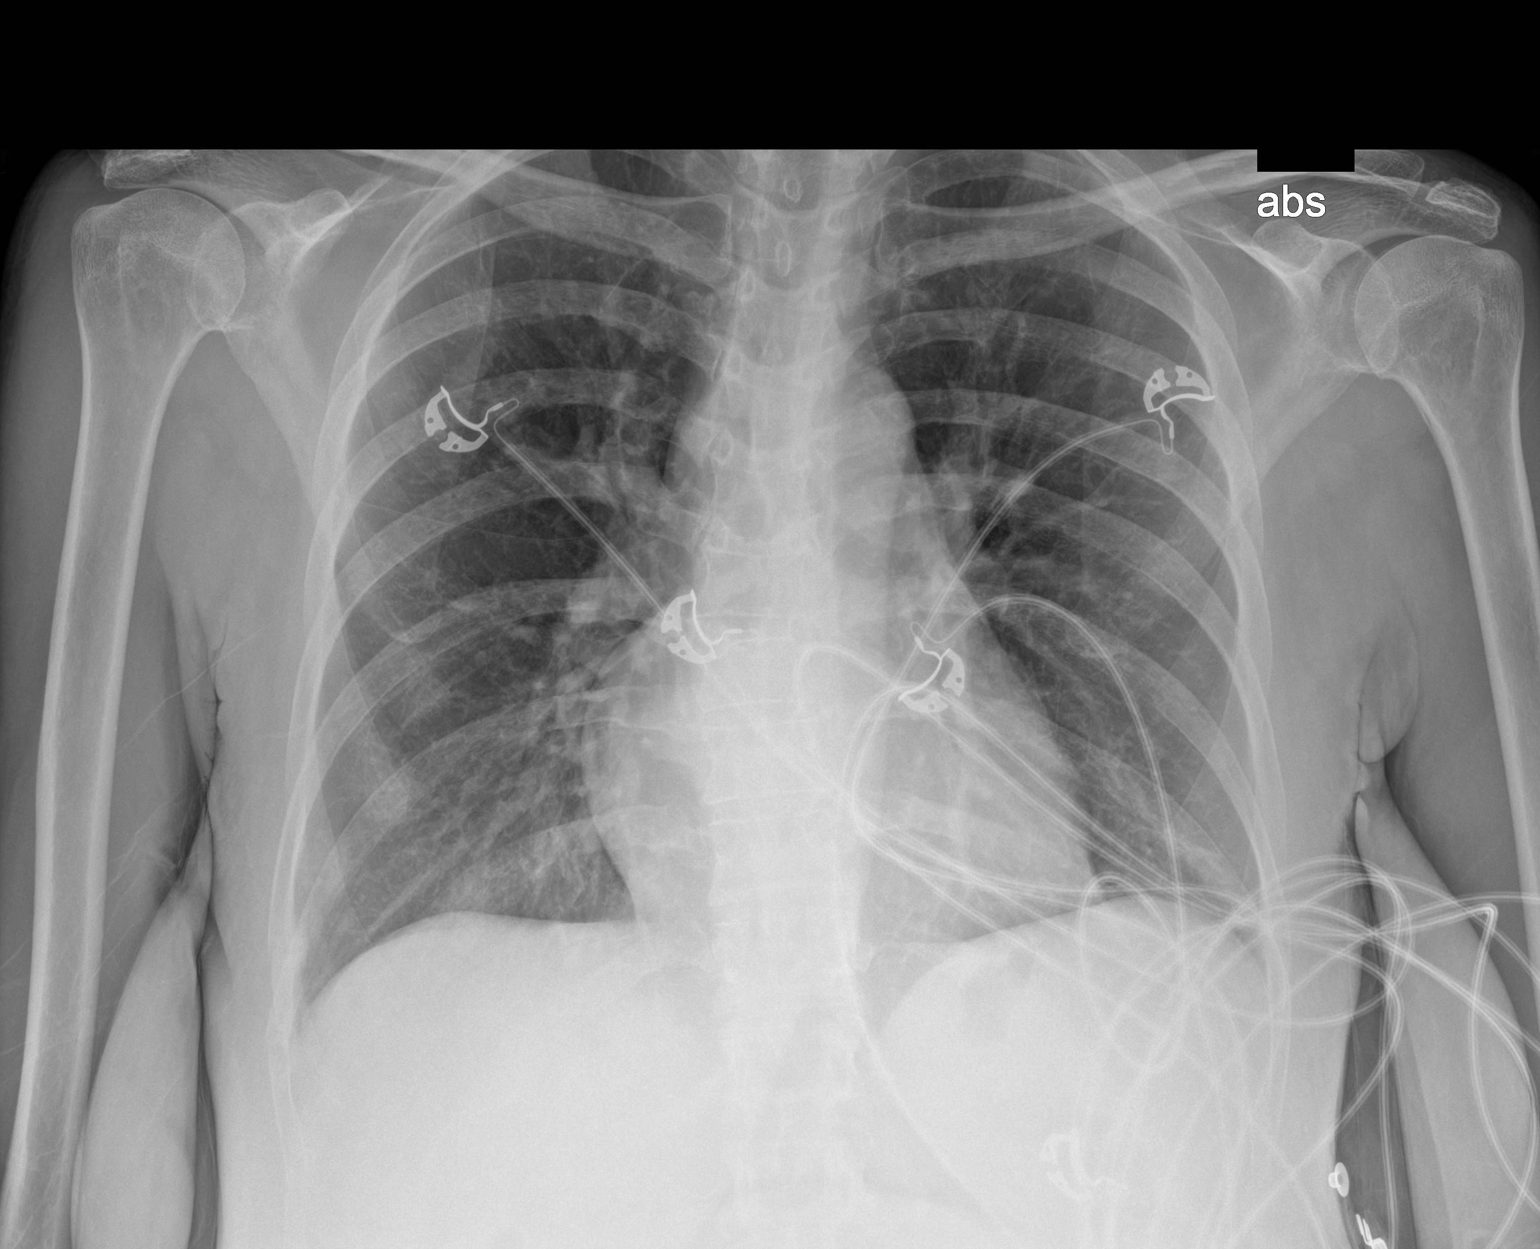

[1 of 1 positions shown; findings below may reference images not displayed]

FINDINGS: Lateral right seventh and eighth rib fractures. There may be
associated callus. There is no edema, consolidation, effusion, or
pneumothorax. Normal heart size and mediastinal contours. Artifact
from EKG leads.
IMPRESSION: 1. No evidence for pneumonia.
2. Right lateral seventh and eighth rib fractures could be acute or
subacute.

## 2019-10-07 MED ORDER — SODIUM CHLORIDE 0.9 % IV SOLN: INTRAVENOUS | Status: DC

## 2019-10-07 MED ORDER — INSULIN REGULAR(HUMAN) IN NACL 100-0.9 UT/100ML-% IV SOLN
INTRAVENOUS | Status: DC
  Administered 2019-10-07: 5.4 [IU]/h via INTRAVENOUS
  Filled 2019-10-07: qty 100

## 2019-10-07 MED ORDER — INSULIN GLARGINE 100 UNIT/ML ~~LOC~~ SOLN
10.0000 [IU] | Freq: Every day | SUBCUTANEOUS | Status: DC
  Administered 2019-10-07: 10 [IU] via SUBCUTANEOUS
  Filled 2019-10-07 (×2): qty 0.1

## 2019-10-07 MED ORDER — SODIUM CHLORIDE 0.9 % IV SOLN: Freq: Once | INTRAVENOUS | Status: DC

## 2019-10-07 MED ORDER — INSULIN ASPART 100 UNIT/ML ~~LOC~~ SOLN: 0.0000 [IU] | Freq: Every day | SUBCUTANEOUS | Status: DC

## 2019-10-07 MED ORDER — SODIUM CHLORIDE 0.9 % IV SOLN: INTRAVENOUS | Status: AC

## 2019-10-07 MED ORDER — INSULIN ASPART 100 UNIT/ML ~~LOC~~ SOLN
0.0000 [IU] | Freq: Three times a day (TID) | SUBCUTANEOUS | Status: DC
  Administered 2019-10-08 (×2): 3 [IU] via SUBCUTANEOUS
  Administered 2019-10-08: 2 [IU] via SUBCUTANEOUS
  Filled 2019-10-07 (×3): qty 1

## 2019-10-07 MED ORDER — ENOXAPARIN SODIUM 40 MG/0.4ML ~~LOC~~ SOLN: 40.0000 mg | SUBCUTANEOUS | Status: DC

## 2019-10-07 MED ORDER — ASPIRIN EC 81 MG PO TBEC
81.0000 mg | DELAYED_RELEASE_TABLET | Freq: Every day | ORAL | Status: DC
  Administered 2019-10-08: 10:00:00 81 mg via ORAL
  Filled 2019-10-07: qty 1

## 2019-10-07 MED ORDER — SODIUM CHLORIDE 0.9 % IV SOLN
INTRAVENOUS | Status: AC
  Administered 2019-10-08: 02:00:00 via INTRAVENOUS

## 2019-10-07 MED ORDER — INSULIN ASPART 100 UNIT/ML ~~LOC~~ SOLN
5.0000 [IU] | Freq: Once | SUBCUTANEOUS | Status: AC
  Administered 2019-10-07: 12:00:00 5 [IU] via INTRAVENOUS
  Filled 2019-10-07: qty 1

## 2019-10-07 MED ORDER — SODIUM CHLORIDE 0.9 % IV BOLUS
1000.0000 mL | Freq: Once | INTRAVENOUS | Status: AC
  Administered 2019-10-07: 11:00:00 1000 mL via INTRAVENOUS

## 2019-10-07 MED ORDER — DEXTROSE-NACL 5-0.45 % IV SOLN: INTRAVENOUS | Status: DC

## 2019-10-07 MED ORDER — ENOXAPARIN SODIUM 40 MG/0.4ML ~~LOC~~ SOLN: 30.0000 mg | SUBCUTANEOUS | Status: DC

## 2019-10-07 MED ORDER — SODIUM CHLORIDE 0.9 % IV SOLN
INTRAVENOUS | Status: DC
  Administered 2019-10-07: 12:00:00 via INTRAVENOUS

## 2019-10-07 MED ORDER — ATORVASTATIN CALCIUM 20 MG PO TABS
80.0000 mg | ORAL_TABLET | Freq: Every evening | ORAL | Status: DC
  Administered 2019-10-08: 18:00:00 80 mg via ORAL
  Filled 2019-10-07: qty 4

## 2019-10-07 NOTE — Progress Notes (Signed)
PHARMACIST - PHYSICIAN COMMUNICATION  CONCERNING:  Enoxaparin (Lovenox) for DVT Prophylaxis    RECOMMENDATION: Patient was prescribed enoxaprin 47m q24 hours for VTE prophylaxis.   Filed Weights   10/07/19 0956  Weight: 130 lb (59 kg)    Body mass index is 20.98 kg/m.  Estimated Creatinine Clearance: 19.9 mL/min (A) (by C-G formula based on SCr of 2.84 mg/dL (H)).   Patient is candidate for enoxaparin 355mevery 24 hours based on CrCl <307min   DESCRIPTION: [facility]  Patient is now receiving enoxaparin 52m70mery 24 hours.  CharLu DuffelarmD, BCPS Clinical Pharmacist 10/07/2019 12:56 PM

## 2019-10-07 NOTE — ED Provider Notes (Addendum)
[facility] Emergency Department Provider Note   ____________________________________________   First MD Initiated Contact with Patient 10/07/19 1004     (approximate)  I have reviewed the triage vital signs and the nursing notes.   HISTORY  Chief Complaint Hyperglycemia, Emesis, and Diarrhea   HPI Susan Navarro is a 58 y.o. female who comes in breathing hard with nausea vomiting and diarrhea.  She has had a stool while she is here we will send it for PCR.  Her sugar is very high.  Blood pressures only 100/50.  She has a temperature 99.  Denies any abdominal pain.  Or any pain anywhere.         Past Medical History:  Diagnosis Date  . Anemia   . Chronic kidney disease   . Diabetes mellitus without complication ([place])   . Hypertension   . Stroke [facility]) 03/2018   Ischemic stroke per [facility] records    Patient Active Problem List   Diagnosis Date Noted  . DKA (diabetic ketoacidoses) ([place]) 12/09/2018    History reviewed. No pertinent surgical history.  Prior to Admission medications   Medication Sig Start Date End Date Taking? Authorizing Provider  amLODipine (NORVASC) 5 MG tablet Take 5 mg by mouth every evening.    Yes [provider]  aspirin EC 81 MG tablet Take 81 mg by mouth daily. 02/09/17  Yes [provider]  atorvastatin (LIPITOR) 80 MG tablet Take 80 mg by mouth every evening. 11/23/18 11/23/19 Yes [provider]  Insulin Glargine (LANTUS SOLOSTAR) 100 UNIT/ML Solostar Pen Inject 15 Units into the skin daily. 11/30/18  Yes [provider]  insulin lispro (HUMALOG KWIKPEN) 100 UNIT/ML KwikPen Inject 5 Units into the skin 3 (three) times daily before meals. 11/30/18 11/30/19 Yes [provider]  lisinopril (ZESTRIL) 2.5 MG tablet Take 2.5 mg by mouth every evening.    Yes [provider]  metoprolol tartrate (LOPRESSOR) 25 MG tablet Take 25 mg by mouth 2 (two) times daily.    Yes [provider]    Allergies Patient has no known allergies.  Family History  Problem Relation Age of Onset  . Diabetes Mother   . Diabetes Brother     Social History Social History   Tobacco Use  . Smoking status: Never Smoker  . Smokeless tobacco: Never Used  Substance Use Topics  . Alcohol use: No  . Drug use: Not on file    Review of Systems  Constitutional: No fever/chills Eyes: No visual changes. ENT: No sore throat. Cardiovascular: Denies chest pain. Respiratory: Denies shortness of breath. Gastrointestinal: No abdominal pain.  nausea, vomiting.   diarrhea.  No constipation. Genitourinary: Negative for dysuria. Musculoskeletal: Negative for back pain. Skin: Negative for rash. Neurological: Negative for headaches, focal weakness  ____________________________________________   PHYSICAL EXAM:  VITAL SIGNS: ED Triage Vitals  Enc Vitals Group     BP 10/07/19 0955 (!) 100/50     Pulse Rate 10/07/19 0955 99     Resp 10/07/19 0955 19     Temp 10/07/19 0955 99.2 F (37.3 C)     Temp Source 10/07/19 0955 Oral     SpO2 10/07/19 0955 98 %     Weight 10/07/19 0956 130 lb (59 kg)     Height 10/07/19 0956 5' 6"  (1.676 m)     Head Circumference --      Peak Flow --      Pain Score 10/07/19 0955 0  Pain Loc --      Pain Edu? --      Excl. in [place]? --     Constitutional: Alert and oriented. Well appearing and in no acute distress. Eyes: Conjunctivae are normal.  Head: Atraumatic. Nose: No congestion/rhinnorhea. Mouth/Throat: Mucous membranes are moist.  Oropharynx non-erythematous. Neck: No stridor.  Cardiovascular: Normal rate, regular rhythm. Grossly normal heart sounds.  Good peripheral circulation. Respiratory: Normal respiratory effort.  No retractions. Lungs CTAB. Gastrointestinal: Soft and nontender. No distention. No abdominal bruits. No CVA tenderness. Musculoskeletal: No lower extremity tenderness nor edema.   Neurologic:  Normal speech and  language. No gross focal neurologic deficits are appreciated.  Skin:  Skin is warm, dry and intact. No rash noted.  ____________________________________________   LABS (all labs ordered are listed, but only abnormal results are displayed)  Labs Reviewed  GLUCOSE, CAPILLARY - Abnormal; Notable for the following components:      Result Value   Glucose-Capillary >600 (*)    All other components within normal limits  COMPREHENSIVE METABOLIC PANEL - Abnormal; Notable for the following components:   Sodium 127 (*)    Potassium 5.4 (*)    Chloride 89 (*)    CO2 18 (*)    Glucose, Bld 1,015 (*)    BUN 78 (*)    Creatinine, Ser 2.84 (*)    Total Protein 5.9 (*)    ALT 66 (*)    Alkaline Phosphatase 347 (*)    Total Bilirubin 1.4 (*)    GFR calc non Af Amer 17 (*)    GFR calc Af Amer 20 (*)    Anion gap 20 (*)    All other components within normal limits  CBC - Abnormal; Notable for the following components:   RBC 2.79 (*)    Hemoglobin 8.3 (*)    HCT 25.7 (*)    Platelets 71 (*)    All other components within normal limits  BLOOD GAS, VENOUS - Abnormal; Notable for the following components:   pCO2, Ven 34 (*)    Bicarbonate 16.7 (*)    Acid-base deficit 8.8 (*)    All other components within normal limits  GLUCOSE, CAPILLARY - Abnormal; Notable for the following components:   Glucose-Capillary >600 (*)    All other components within normal limits  GLUCOSE, CAPILLARY - Abnormal; Notable for the following components:   Glucose-Capillary >600 (*)    All other components within normal limits  GLUCOSE, CAPILLARY - Abnormal; Notable for the following components:   Glucose-Capillary >600 (*)    All other components within normal limits  GI PATHOGEN PANEL BY PCR, STOOL  C DIFFICILE QUICK SCREEN W PCR REFLEX  SARS CORONAVIRUS 2 (TAT 6-24 HRS)  OVA + PARASITE EXAM  LIPASE, BLOOD  URINALYSIS, COMPLETE (UACMP) WITH MICROSCOPIC  BASIC METABOLIC PANEL  BASIC METABOLIC PANEL  BASIC  METABOLIC PANEL  MAGNESIUM  PHOSPHORUS  URINE DRUG SCREEN, QUALITATIVE ([facility] ONLY)  BASIC METABOLIC PANEL  POC URINE PREG, ED   ____________________________________________  EKG  EKG read interpreted by me shows normal sinus rhythm rate of 94 normal axis no acute ST-T wave changes ____________________________________________  RADIOLOGY  ED MD interpretation: Chest x-ray read by radiologist reviewed by me.  Patient has no pain in her ribs.  Radiologist thinks there may be some rib fractures which may be old.  Official radiology report(s): Dg Chest Portable 1 View  Result Date: 10/07/2019 CLINICAL DATA:  High blood sugar EXAM: PORTABLE CHEST 1 VIEW COMPARISON:  04/14/2019 FINDINGS: Lateral right seventh and eighth rib fractures. There may be associated callus. There is no edema, consolidation, effusion, or pneumothorax. Normal heart size and mediastinal contours. Artifact from EKG leads. IMPRESSION: 1. No evidence for pneumonia. 2. Right lateral seventh and eighth rib fractures could be acute or subacute. Electronically Signed   By: Monte Fantasia M.D.   On: 10/07/2019 10:48    ____________________________________________   PROCEDURES  Procedure(s) performed (including Critical Care): Critical care time 20 minutes this includes reviewing the patient's record and speaking with the hospitalist and evaluating the patient.  Procedures   ____________________________________________   INITIAL IMPRESSION / ASSESSMENT AND PLAN / ED COURSE  Patient again at 1 tells me she has no pain.  Her alk phos is elevated.  BUN and creatinine are higher than normal.  Alk phos and liver functions have been elevated in the past.  Patient has no abdominal pain.  Do not anticipate any problems with this.  Hospitalist has been notified for admission.    Yaritzy Huser was evaluated in Emergency Department on 10/07/2019 for the symptoms described in the history of present illness. She was  evaluated in the context of the global COVID-19 pandemic, which necessitated consideration that the patient might be at risk for infection with the SARS-CoV-2 virus that causes COVID-19. Institutional protocols and algorithms that pertain to the evaluation of patients at risk for COVID-19 are in a state of rapid change based on information released by regulatory bodies including the CDC and federal and state organizations. These policies and algorithms were followed during the patient's care in the ED.  ____________________________________________   FINAL CLINICAL IMPRESSION(S) / ED DIAGNOSES  Final diagnoses:  Type 2 diabetes mellitus with hyperosmolar nonketotic hyperglycemia [facility])     ED Discharge Orders    None       Note:  This document was prepared using Dragon voice recognition software and may include unintentional dictation errors.    Nena Polio, MD 10/07/19 1141    Nena Polio, MD 10/07/19 1409    Nena Polio, MD 10/07/19 [phone]

## 2019-10-07 NOTE — ED Notes (Signed)
ED TO INPATIENT HANDOFF REPORT  ED Nurse Name and Phone #:  Anson Crofts Name/Age/Gender Susan Navarro 58 y.o. female Room/Bed: ED01A/ED01A  Code Status   Code Status: Full Code  Home/SNF/Other Home Patient oriented to: self and place Is this baseline? Yes   Triage Complete: Triage complete  Chief Complaint Hyperglycemia  Triage Note Pt arrival via [facility] from home due to vomiting and diarrhea that started last night around 10 pm.   Pt reports feeling thirsty, lightheaded, and nauseas at this time. Denies known fevers at home.   Allergies No Known Allergies  Level of Care/Admitting Diagnosis ED Disposition    ED Disposition Condition [place] [facility]: [place] [[id number]]  Level of Care: Med-Surg [16]  Covid Evaluation: Asymptomatic Screening Protocol (No Symptoms)  Diagnosis: DKA (diabetic ketoacidoses) [facility]) [[id number]]  Admitting Physician: Otila Back [place]  Attending Physician: Otila Back [[id number]]  Estimated length of stay: past midnight tomorrow  Certification:: I certify this patient will need inpatient services for at least 2 midnights  PT Class (Do Not Modify): Inpatient [101]  PT Acc Code (Do Not Modify): Private [1]       B Medical/Surgery History Past Medical History:  Diagnosis Date  . Anemia   . Chronic kidney disease   . Diabetes mellitus without complication ([place])   . Hypertension   . Stroke [facility]) 03/2018   Ischemic stroke per [facility] records   History reviewed. No pertinent surgical history.   A IV Location/Drains/Wounds Patient Lines/Drains/Airways Status   Active Line/Drains/Airways    Name:   Placement date:   Placement time:   Site:   Days:   Peripheral IV 10/07/19 Left Antecubital   10/07/19    1002    Antecubital   less than 1   Peripheral IV Right Antecubital   -    -    Antecubital      External Urinary Catheter   08/05/19    1514    -   63   Wound / Incision (Open or Dehisced) 12/09/18 Diabetic ulcer  Foot Right;Other (Comment);Posterior dark purple   12/09/18    1500    Foot   302          Intake/Output Last 24 hours  Intake/Output Summary (Last 24 hours) at 10/07/2019 2123 Last data filed at 10/07/2019 1237 Gross per 24 hour  Intake 2000 ml  Output -  Net 2000 ml    Labs/Imaging Results for orders placed or performed during the hospital encounter of 10/07/19 (from the past 48 hour(s))  Glucose, capillary     Status: Abnormal   Collection Time: 10/07/19  9:55 AM  Result Value Ref Range   Glucose-Capillary >600 (HH) 70 - 99 mg/dL  Lipase, blood     Status: None   Collection Time: 10/07/19 10:05 AM  Result Value Ref Range   Lipase 27 11 - 51 U/L    Comment: Performed at [facility], [address] [place], [place] [address]  Comprehensive metabolic panel     Status: Abnormal   Collection Time: 10/07/19 10:05 AM  Result Value Ref Range   Sodium 127 (L) 135 - 145 mmol/L   Potassium 5.4 (H) 3.5 - 5.1 mmol/L   Chloride 89 (L) 98 - 111 mmol/L   CO2 18 (L) 22 - 32 mmol/L   Glucose, Bld 1,015 (HH) 70 - 99 mg/dL    Comment: CRITICAL RESULT CALLED TO, READ BACK BY AND VERIFIED WITH  CHRISTY BRAND AT 9480 10/07/2019.PMF   BUN 78 (H) 6 - 20 mg/dL   Creatinine, Ser 2.84 (H) 0.44 - 1.00 mg/dL   Calcium 9.0 8.9 - 10.3 mg/dL   Total Protein 5.9 (L) 6.5 - 8.1 g/dL   Albumin 3.5 3.5 - 5.0 g/dL   AST 31 15 - 41 U/L   ALT 66 (H) 0 - 44 U/L   Alkaline Phosphatase 347 (H) 38 - 126 U/L   Total Bilirubin 1.4 (H) 0.3 - 1.2 mg/dL   GFR calc non Af Amer 17 (L) >60 mL/min   GFR calc Af Amer 20 (L) >60 mL/min   Anion gap 20 (H) 5 - 15    Comment: Performed at [facility], [address] [place], [place] [address]  CBC     Status: Abnormal   Collection Time: 10/07/19 10:05 AM  Result Value Ref Range   WBC 6.4 4.0 - 10.5 K/uL   RBC 2.79 (L) 3.87 - 5.11 MIL/uL   Hemoglobin 8.3 (L) 12.0 - 15.0 g/dL   HCT 25.7 (L) 36.0 - 46.0 %   MCV 92.1 80.0 - 100.0 fL   MCH 29.7  26.0 - 34.0 pg   MCHC 32.3 30.0 - 36.0 g/dL   RDW 12.3 11.5 - 15.5 %   Platelets 71 (L) 150 - 400 K/uL    Comment: Immature Platelet Fraction may be clinically indicated, consider ordering this additional test [id number]    nRBC 0.0 0.0 - 0.2 %    Comment: Performed at [facility], [address] [place], [place] [address]  Blood gas, venous     Status: Abnormal   Collection Time: 10/07/19 10:09 AM  Result Value Ref Range   pH, Ven 7.30 7.250 - 7.430   pCO2, Ven 34 (L) 44.0 - 60.0 mmHg   pO2, Ven 42.0 32.0 - 45.0 mmHg   Bicarbonate 16.7 (L) 20.0 - 28.0 mmol/L   Acid-base deficit 8.8 (H) 0.0 - 2.0 mmol/L   O2 Saturation 71.3 %   Patient temperature 37.0    Collection site VENOUS    Sample type VENOUS     Comment: Performed at [facility], [address] [place], [place] [address]  Glucose, capillary     Status: Abnormal   Collection Time: 10/07/19 12:31 PM  Result Value Ref Range   Glucose-Capillary >600 (HH) 70 - 99 mg/dL  Glucose, capillary     Status: Abnormal   Collection Time: 10/07/19  1:45 PM  Result Value Ref Range   Glucose-Capillary >600 (HH) 70 - 99 mg/dL  Glucose, capillary     Status: Abnormal   Collection Time: 10/07/19  3:09 PM  Result Value Ref Range   Glucose-Capillary >600 (HH) 70 - 99 mg/dL  Basic metabolic panel     Status: Abnormal   Collection Time: 10/07/19  3:48 PM  Result Value Ref Range   Sodium 133 (L) 135 - 145 mmol/L   Potassium 3.7 3.5 - 5.1 mmol/L   Chloride 101 98 - 111 mmol/L   CO2 22 22 - 32 mmol/L   Glucose, Bld 687 (HH) 70 - 99 mg/dL    Comment: CRITICAL RESULT CALLED TO, READ BACK BY AND VERIFIED WITH BREANNA CHAPMAN AT 1622 10/07/2019  TFK    BUN 78 (H) 6 - 20 mg/dL   Creatinine, Ser 2.75 (H) 0.44 - 1.00 mg/dL   Calcium 8.6 (L) 8.9 - 10.3 mg/dL   GFR calc non Af Amer 18 (L) >60 mL/min  GFR calc Af Amer 21 (L) >60 mL/min   Anion gap 10 5 - 15    Comment: Performed at [facility], [address] [place], [place] [address]  Magnesium     Status: None   Collection Time: 10/07/19  3:48 PM  Result Value Ref Range   Magnesium 2.3 1.7 - 2.4 mg/dL    Comment: Performed at [facility], [address] [place], [place] [address]  Phosphorus     Status: None   Collection Time: 10/07/19  3:48 PM  Result Value Ref Range   Phosphorus 2.9 2.5 - 4.6 mg/dL    Comment: Performed at [facility] [address] [place], [place] [address]  Glucose, capillary     Status: Abnormal   Collection Time: 10/07/19  4:41 PM  Result Value Ref Range   Glucose-Capillary >600 (HH) 70 - 99 mg/dL  Glucose, capillary     Status: Abnormal   Collection Time: 10/07/19  6:08 PM  Result Value Ref Range   Glucose-Capillary 478 (H) 70 - 99 mg/dL  C difficile quick scan w PCR reflex     Status: None   Collection Time: 10/07/19  6:57 PM   Specimen: STOOL  Result Value Ref Range   C Diff antigen NEGATIVE NEGATIVE   C Diff toxin NEGATIVE NEGATIVE   C Diff interpretation No C. difficile detected.     Comment: Performed at [facility], [address] [place],  [address]  Glucose, capillary     Status: Abnormal   Collection Time: 10/07/19  7:18 PM  Result Value Ref Range   Glucose-Capillary 432 (H) 70 - 99 mg/dL  Glucose, capillary     Status: Abnormal   Collection Time: 10/07/19  8:50 PM  Result Value Ref Range   Glucose-Capillary 324 (H) 70 - 99 mg/dL   Dg Chest Portable 1 View  Result Date: 10/07/2019 CLINICAL DATA:  High blood sugar EXAM: PORTABLE CHEST 1 VIEW COMPARISON:  04/14/2019 FINDINGS: Lateral right seventh and eighth rib fractures. There may be associated callus. There is no edema, consolidation, effusion, or pneumothorax. Normal heart size and mediastinal contours. Artifact from EKG leads. IMPRESSION: 1. No evidence for pneumonia. 2. Right lateral seventh and eighth rib fractures could be acute or subacute. Electronically Signed   By: Monte Fantasia M.D.    On: 10/07/2019 10:48    Pending Labs Unresulted Labs (From admission, onward)    Start     Ordered   10/08/19 0500  CBC  Tomorrow morning,   STAT     10/07/19 1212   10/08/19 0500  Comprehensive metabolic panel  Tomorrow morning,   STAT     10/07/19 1212   10/08/19 0500  Magnesium  Tomorrow morning,   STAT     10/07/19 1212   10/08/19 0500  Phosphorus  Tomorrow morning,   STAT     10/07/19 1212   10/08/19 0500  Hemoglobin A1c  Tomorrow morning,   STAT     10/07/19 1212   10/08/19 1324  Basic metabolic panel  Daily,   STAT     10/07/19 2112   10/07/19 4010  Basic metabolic panel  STAT Now then every 4 hours ,   STAT     10/07/19 1210   10/07/19 1213  Urine Drug Screen, Qualitative ([place] only)  Once,   STAT     10/07/19 1212   10/07/19 1211  OVA + PARASITE EXAM  Once,   STAT  10/07/19 1212   10/07/19 1135  SARS CORONAVIRUS 2 (TAT 6-24 HRS) Nasopharyngeal Nasopharyngeal Swab  (Asymptomatic/Tier 2 Patients Labs)  Once,   STAT    Question Answer Comment  Is this test for diagnosis or screening Screening   Symptomatic for COVID-19 as defined by CDC No   Hospitalized for COVID-19 No   Admitted to ICU for COVID-19 No   Previously tested for COVID-19 Yes   Resident in a congregate (group) care setting No   Employed in healthcare setting No   Pregnant No      10/07/19 1134   10/07/19 1021  GI pathogen panel by PCR, stool  (Gastrointestinal Panel by PCR, Stool                                                                                                                                                     *Does Not include CLOSTRIDIUM DIFFICILE testing.**If CDIFF testing is needed, select the C Difficile Quick Screen w PCR reflex order below)  ONCE - STAT,   STAT     10/07/19 1020   10/07/19 0959  Urinalysis, Complete w Microscopic  ONCE - STAT,   STAT     10/07/19 1000          Vitals/Pain Today's Vitals   10/07/19 1730 10/07/19 1800 10/07/19 1830 10/07/19 1900  BP: (!)  109/54 (!) 107/51 (!) 113/53 132/63  Pulse: 91 91 91 94  Resp: 19 20 19 19   Temp:      TempSrc:      SpO2: 96% 96% 97% 93%  Weight:      Height:      PainSc:        Isolation Precautions Enteric precautions (UV disinfection)  Medications Medications  insulin regular, human (MYXREDLIN) 100 units/ 100 mL infusion (7.4 Units/hr Intravenous Rate/Dose Change 10/07/19 1929)  aspirin EC tablet 81 mg (has no administration in time range)  atorvastatin (LIPITOR) tablet 80 mg (has no administration in time range)  0.9 %  sodium chloride infusion (has no administration in time range)  enoxaparin (LOVENOX) injection 30 mg (has no administration in time range)  0.9 %  sodium chloride infusion (has no administration in time range)  insulin glargine (LANTUS) injection 10 Units (has no administration in time range)  insulin aspart (novoLOG) injection 0-9 Units (has no administration in time range)  insulin aspart (novoLOG) injection 0-5 Units (has no administration in time range)  sodium chloride 0.9 % bolus 1,000 mL (0 mLs Intravenous Stopped 10/07/19 1237)  sodium chloride 0.9 % bolus 1,000 mL (0 mLs Intravenous Stopped 10/07/19 1237)  insulin aspart (novoLOG) injection 5 Units (5 Units Intravenous Given 10/07/19 1136)    Mobility walks Low fall risk   Focused Assessments hyperglycemia    R Recommendations: See Admitting Provider Note  Report given to:  Additional Notes:  Insulin pump off

## 2019-10-07 NOTE — ED Notes (Signed)
Pt alert. RN asked if pt wanted staff to update family. Pt denied wanting anyone notified and reported she would update them later.

## 2019-10-07 NOTE — ED Notes (Signed)
Pericare provided 

## 2019-10-07 NOTE — ED Triage Notes (Signed)
Pt arrival via [facility] from home due to vomiting and diarrhea that started last night around 10 pm.   Pt reports feeling thirsty, lightheaded, and nauseas at this time. Denies known fevers at home.

## 2019-10-07 NOTE — H&P (Signed)
[facility] NAME: Akiah Bauch    MR#:  [id number]  DATE OF BIRTH:  Nov 27, 1961  DATE OF ADMISSION:  10/07/2019  PRIMARY CARE PHYSICIAN: Rochel Brome, MD   REQUESTING/REFERRING PHYSICIAN: Conni Slipper  CHIEF COMPLAINT:   Chief Complaint  Patient presents with  . Hyperglycemia  . Emesis  . Diarrhea    HISTORY OF PRESENT ILLNESS:  Susan Navarro  is a 58 y.o. female with a known history of diabetes mellitus, chronic kidney disease stage IV, CVA and hypertension who presented to the emergency room with complaints of nausea, vomiting and diarrhea which has been going on since yesterday.  Reported several loose stools.  No abdominal pains.  No fevers.  Patient was evaluated in the emergency room and diagnosed with DKA with blood sugar of 1015 with anion gap of 20.  Evidence of acute kidney injury.  Patient started on IV fluids.  Already given 2 L and continued on IV fluids as well as insulin drip.  Medical service called to admit patient for further evaluation and management.  PAST MEDICAL HISTORY:   Past Medical History:  Diagnosis Date  . Anemia   . Chronic kidney disease   . Diabetes mellitus without complication ([place])   . Hypertension   . Stroke ([place]) 03/2018   Ischemic stroke per [facility] records    PAST SURGICAL HISTORY:  History reviewed. No pertinent surgical history.  SOCIAL HISTORY:   Social History   Tobacco Use  . Smoking status: Never Smoker  . Smokeless tobacco: Never Used  Substance Use Topics  . Alcohol use: No    FAMILY HISTORY:   Family History  Problem Relation Age of Onset  . Diabetes Mother   . Diabetes Brother     DRUG ALLERGIES:  No Known Allergies  REVIEW OF SYSTEMS:   Review of Systems  Constitutional: Negative for chills and fever.  HENT: Negative for hearing loss and tinnitus.   Eyes: Negative for blurred vision and double vision.  Respiratory: Negative for cough and  hemoptysis.   Cardiovascular: Negative for chest pain and palpitations.  Gastrointestinal: Positive for diarrhea, nausea and vomiting. Negative for abdominal pain and heartburn.  Genitourinary: Negative for dysuria.  Musculoskeletal: Negative for myalgias and neck pain.  Skin: Negative for rash.  Neurological: Negative for dizziness and headaches.  Psychiatric/Behavioral: Negative for depression and hallucinations.    MEDICATIONS AT HOME:   Prior to Admission medications   Medication Sig Start Date End Date Taking? Authorizing Provider  amLODipine (NORVASC) 5 MG tablet Take 5 mg by mouth every evening.    Yes [provider]  aspirin EC 81 MG tablet Take 81 mg by mouth daily. 02/09/17  Yes [provider]  atorvastatin (LIPITOR) 80 MG tablet Take 80 mg by mouth every evening. 11/23/18 11/23/19 Yes [provider]  Insulin Glargine (LANTUS SOLOSTAR) 100 UNIT/ML Solostar Pen Inject 15 Units into the skin daily. 11/30/18  Yes [provider]  insulin lispro (HUMALOG KWIKPEN) 100 UNIT/ML KwikPen Inject 5 Units into the skin 3 (three) times daily before meals. 11/30/18 11/30/19 Yes [provider]  lisinopril (ZESTRIL) 2.5 MG tablet Take 2.5 mg by mouth every evening.    Yes [provider]  metoprolol tartrate (LOPRESSOR) 25 MG tablet Take 25 mg by mouth 2 (two) times daily.    Yes [provider]      VITAL SIGNS:  Blood pressure (!) 103/46, pulse 91, temperature 99.2  F (37.3 C), temperature source Oral, resp. rate 17, height 5' 6"  (1.676 m), weight 59 kg, SpO2 97 %.  PHYSICAL EXAMINATION:  Physical Exam  GENERAL:  58 y.o.-year-old patient lying in the bed with no acute distress.  EYES: Pupils equal, round, reactive to light and accommodation. No scleral icterus. Extraocular muscles intact.  HEENT: Head atraumatic, normocephalic.  Dry oral mucosa.    NECK:  Supple, no jugular venous distention. No thyroid enlargement, no  tenderness.  LUNGS: Normal breath sounds bilaterally, no wheezing, rales,rhonchi or crepitation. No use of accessory muscles of respiration.  CARDIOVASCULAR: S1, S2 normal. No murmurs, rubs, or gallops.  ABDOMEN: Soft, nontender, nondistended. Bowel sounds present. No organomegaly or mass.  EXTREMITIES: No pedal edema, cyanosis, or clubbing.  NEUROLOGIC: Cranial nerves II through XII are intact. Muscle strength 5/5 in all extremities. Sensation intact. Gait not checked.  PSYCHIATRIC: The patient is alert and oriented x 3.  SKIN: No obvious rash, lesion, or ulcer.   LABORATORY PANEL:   CBC Recent Labs  Lab 10/07/19 1005  WBC 6.4  HGB 8.3*  HCT 25.7*  PLT 71*   ------------------------------------------------------------------------------------------------------------------  Chemistries  Recent Labs  Lab 10/07/19 1005  NA 127*  K 5.4*  CL 89*  CO2 18*  GLUCOSE 1,015*  BUN 78*  CREATININE 2.84*  CALCIUM 9.0  AST 31  ALT 66*  ALKPHOS 347*  BILITOT 1.4*   ------------------------------------------------------------------------------------------------------------------  Cardiac Enzymes No results for input(s): TROPONINI in the last 168 hours. ------------------------------------------------------------------------------------------------------------------  RADIOLOGY:  Dg Chest Portable 1 View  Result Date: 10/07/2019 CLINICAL DATA:  High blood sugar EXAM: PORTABLE CHEST 1 VIEW COMPARISON:  04/14/2019 FINDINGS: Lateral right seventh and eighth rib fractures. There may be associated callus. There is no edema, consolidation, effusion, or pneumothorax. Normal heart size and mediastinal contours. Artifact from EKG leads. IMPRESSION: 1. No evidence for pneumonia. 2. Right lateral seventh and eighth rib fractures could be acute or subacute. Electronically Signed   By: Monte Fantasia M.D.   On: 10/07/2019 10:48      IMPRESSION AND PLAN:  Patient is a 58 year old female with  history of diabetes mellitus, chronic kidney disease stage IV, CVA and hypertension who presented to the emergency room with complaints of nausea, vomiting and diarrhea which has been going on since yesterday.  Diagnosed with DKA.  1.  Diabetic ketoacidosis. Patient claims to be compliant with diabetic regimen.  Has not taken insulin today however due to nausea vomiting and diarrhea. Continue insulin drip and IV fluids.  Being admitted to the stepdown unit once bed available. Monitor serial BMP every 4 hours and once anion gap corrected to wean off insulin drip to subcu insulin.  2.  Acute kidney injury Secondary to dehydration. Continue IV fluid hydration.  Follow-up on renal function in a.m.  If worsening of renal function to consider nephrology consult in a.m.   3.  Hyponatremia with sodium of 127 This is pseudohyponatremia secondary to hyperglycemia Expect correction with treatment of hypoglycemia  4.  Mild hyperkalemia with potassium of 5.4 Expect resolution with ongoing insulin drip  5.  Gastroenteritis Patient presented with nausea vomiting and diarrhea.  No abdominal pains. Follow-up on stool studies including C. Difficile, GI panel and ova and parasite. IV fluid hydration. Symptoms appear significantly improved at this time  6.  Hyperlipidemia Continue statins  DVT prophylaxis; Lovenox  All the records are reviewed and case discussed with ED provider. Management plans discussed with the patient, and she is in agreement  CODE STATUS: Full code  TOTAL TIME TAKING CARE OF THIS PATIENT: 62 minutes.    Jennell Janosik M.D on 10/07/2019 at 3:37 PM  Between 7am to 6pm - Pager - [phone]  After 6pm go to [email or web address] - password [email or web address] [facility]  [facility]  Office  [phone]  CC: Primary care physician; Rochel Brome, MD   Note: This dictation was prepared with Dragon dictation along with smaller phrase technology. Any transcriptional  errors that result from this process are unintentional.

## 2019-10-07 NOTE — ED Notes (Signed)
MD Ojie made aware of glucose of 687 and anion gap of 10. Pt continuing insulin drip at this time. No new orders at this time.

## 2019-10-07 NOTE — ED Notes (Signed)
Date and time results received: 10/07/19 1038 (use smartphrase ".now" to insert current time)  Test: Glucose Critical Value: 1015  Name of Provider Notified: Malinda  Orders Received? Or Actions Taken?:

## 2019-10-08 DIAGNOSIS — E1110 Type 2 diabetes mellitus with ketoacidosis without coma: Secondary | ICD-10-CM | POA: Diagnosis not present

## 2019-10-08 LAB — COMPREHENSIVE METABOLIC PANEL
ALT: 54 U/L — ABNORMAL HIGH (ref 0–44)
AST: 22 U/L (ref 15–41)
Albumin: 2.9 g/dL — ABNORMAL LOW (ref 3.5–5.0)
Alkaline Phosphatase: 221 U/L — ABNORMAL HIGH (ref 38–126)
Anion gap: 9 (ref 5–15)
BUN: 79 mg/dL — ABNORMAL HIGH (ref 6–20)
CO2: 22 mmol/L (ref 22–32)
Calcium: 8.6 mg/dL — ABNORMAL LOW (ref 8.9–10.3)
Chloride: 107 mmol/L (ref 98–111)
Creatinine, Ser: 2.38 mg/dL — ABNORMAL HIGH (ref 0.44–1.00)
GFR calc Af Amer: 25 mL/min — ABNORMAL LOW (ref >60)
GFR calc non Af Amer: 22 mL/min — ABNORMAL LOW (ref >60)
Glucose, Bld: 195 mg/dL — ABNORMAL HIGH (ref 70–99)
Potassium: 4.1 mmol/L (ref 3.5–5.1)
Sodium: 138 mmol/L (ref 135–145)
Total Bilirubin: 0.6 mg/dL (ref 0.3–1.2)
Total Protein: 5 g/dL — ABNORMAL LOW (ref 6.5–8.1)

## 2019-10-08 LAB — GASTROINTESTINAL PANEL BY PCR, STOOL (REPLACES STOOL CULTURE)

## 2019-10-08 LAB — CBC
HCT: 25.2 % — ABNORMAL LOW (ref 36.0–46.0)
Hemoglobin: 8.5 g/dL — ABNORMAL LOW (ref 12.0–15.0)
MCH: 29.9 pg (ref 26.0–34.0)
MCHC: 33.7 g/dL (ref 30.0–36.0)
MCV: 88.7 fL (ref 80.0–100.0)
Platelets: 80 10*3/uL — ABNORMAL LOW (ref 150–400)
RBC: 2.84 MIL/uL — ABNORMAL LOW (ref 3.87–5.11)
RDW: 12.6 % (ref 11.5–15.5)
WBC: 8.4 10*3/uL (ref 4.0–10.5)
nRBC: 0 % (ref 0.0–0.2)

## 2019-10-08 LAB — GLUCOSE, CAPILLARY
Glucose-Capillary: 151 mg/dL — ABNORMAL HIGH (ref 70–99)
Glucose-Capillary: 177 mg/dL — ABNORMAL HIGH (ref 70–99)
Glucose-Capillary: 205 mg/dL — ABNORMAL HIGH (ref 70–99)
Glucose-Capillary: 207 mg/dL — ABNORMAL HIGH (ref 70–99)
Glucose-Capillary: 224 mg/dL — ABNORMAL HIGH (ref 70–99)
Glucose-Capillary: 89 mg/dL (ref 70–99)

## 2019-10-08 LAB — PHOSPHORUS: Phosphorus: 3.4 mg/dL (ref 2.5–4.6)

## 2019-10-08 LAB — MAGNESIUM: Magnesium: 2.3 mg/dL (ref 1.7–2.4)

## 2019-10-08 LAB — HEMOGLOBIN A1C
Hgb A1c MFr Bld: 12.2 % — ABNORMAL HIGH (ref 4.8–5.6)
Mean Plasma Glucose: 303.44 mg/dL

## 2019-10-08 LAB — SARS CORONAVIRUS 2 (TAT 6-24 HRS): SARS Coronavirus 2: NEGATIVE

## 2019-10-08 MED ORDER — INSULIN LISPRO (1 UNIT DIAL) 100 UNIT/ML (KWIKPEN): 10.0000 [IU] | PEN_INJECTOR | Freq: Three times a day (TID) | SUBCUTANEOUS | 11 refills | Status: DC

## 2019-10-08 NOTE — Progress Notes (Signed)
Pt's ride present at the [place] entrance; pt discharged via wheelchair by nursing to the [facility] mall entrance

## 2019-10-08 NOTE — Plan of Care (Signed)

## 2019-10-08 NOTE — Care Management CC44 (Signed)
Condition Code 44 Documentation Completed  Patient Details  Name: Keirah Konitzer MRN: [id number] Date of Birth: 07-30-1961   Condition Code 44 given:  Yes Patient signature on Condition Code 44 notice:  Yes Documentation of 2 MD's agreement:  Yes Code 44 added to claim:  Yes    Latanya Maudlin, RN 10/08/2019, 3:29 PM

## 2019-10-08 NOTE — Progress Notes (Signed)
Received verbal order from Dr. Posey Pronto to discontinue Q1HR blood sugar check, and order from Dr. Marcille Blanco to discontinue heart monitor.

## 2019-10-08 NOTE — Plan of Care (Signed)

## 2019-10-08 NOTE — Discharge Summary (Signed)
[facility] NAME: Susan Navarro    MR#:  [id number]  DATE OF BIRTH:  10-12-1961  DATE OF ADMISSION:  10/07/2019 ADMITTING PHYSICIAN: Otila Back, MD  DATE OF DISCHARGE: 10/08/2019  PRIMARY CARE PHYSICIAN: Rochel Brome, MD    ADMISSION DIAGNOSIS:  Type 2 diabetes mellitus with hyperosmolar nonketotic hyperglycemia (HCC) [E11.00] DKA (diabetic ketoacidoses) ([place]) [E11.10]  DISCHARGE DIAGNOSIS:  DKA in Type 2 Diabetes CKD-III -diabetic nephropathy HTN  SECONDARY DIAGNOSIS:   Past Medical History:  Diagnosis Date  . Anemia   . Chronic kidney disease   . Diabetes mellitus without complication ([place])   . Hypertension   . Stroke [facility]) 03/2018   Ischemic stroke per [facility] COURSE:   Susan Navarro  is a 58 y.o. female with a known history of diabetes mellitus, chronic kidney disease stage IV, CVA and hypertension who presented to the emergency room with complaints of nausea, vomiting and diarrhea which has been going on since yesterday.   1.  Diabetic ketoacidosis in Type 2 Dm -now resolved -Patient claims to be compliant with diabetic regimen. howevere non compliant with diet -received IVF and  insulin drip  -AG normal -A1c 12 -Increased Humalog to 10 units bid and Lantus 15 units daily -advised strict diet  2.  Acute on Chronic kidney injury--III (DM nephropathy)  -baseline creat 1.7-1.8 -creat 2.75--2.3 -Secondary to dehydration. -recieved IV fluid hydration.   3.  Hyponatremia with sodium of 127--138 This is pseudohyponatremia secondary to hyperglycemia Expect correction with treatment of hypoglycemia  4.  Mild hyperkalemia with potassium of 5.4--4.1 Expect resolution with ongoing insulin drip  5. Acute  Gastroenteritis Patient presented with nausea vomiting and diarrhea.  No abdominal pains. Stool studies negative Symptoms appear significantly improved at this time  6.   Hyperlipidemia Continue statins  7DVT prophylaxis; Lovenox  8. HTN cont BB and low dose lisinopril -d/ced amlodipine. BP stable  Pt is advised to have close f/u with PCP as out pt. Pt is feeling at baseline and requesting to go home.  CONSULTS OBTAINED:    DRUG ALLERGIES:  No Known Allergies  DISCHARGE MEDICATIONS:   Allergies as of 10/08/2019   No Known Allergies     Medication List    STOP taking these medications   amLODipine 5 MG tablet Commonly known as: NORVASC     TAKE these medications   aspirin EC 81 MG tablet Take 81 mg by mouth daily.   atorvastatin 80 MG tablet Commonly known as: LIPITOR Take 80 mg by mouth every evening.   insulin lispro 100 UNIT/ML KwikPen Commonly known as: HumaLOG KwikPen Inject 0.1 mLs (10 Units total) into the skin 3 (three) times daily before meals. What changed: how much to take   Lantus SoloStar 100 UNIT/ML Solostar Pen Generic drug: Insulin Glargine Inject 15 Units into the skin daily.   lisinopril 2.5 MG tablet Commonly known as: ZESTRIL Take 2.5 mg by mouth every evening.   metoprolol tartrate 25 MG tablet Commonly known as: LOPRESSOR Take 25 mg by mouth 2 (two) times daily.       If you experience worsening of your admission symptoms, develop shortness of breath, life threatening emergency, suicidal or homicidal thoughts you must seek medical attention immediately by calling 911 or calling your MD immediately  if symptoms less severe.  You Must read complete instructions/literature along with all the possible adverse reactions/side effects for all the Medicines you  take and that have been prescribed to you. Take any new Medicines after you have completely understood and accept all the possible adverse reactions/side effects.   Please note  You were cared for by a hospitalist during your hospital stay. If you have any questions about your discharge medications or the care you received while you were in the  hospital after you are discharged, you can call the unit and asked to speak with the hospitalist on call if the hospitalist that took care of you is not available. Once you are discharged, your primary care physician will handle any further medical issues. Please note that NO REFILLS for any discharge medications will be authorized once you are discharged, as it is imperative that you return to your primary care physician (or establish a relationship with a primary care physician if you do not have one) for your aftercare needs so that they can reassess your need for medications and monitor your lab values. Today   SUBJECTIVE   Overall feels better. Sugars much improved  VITAL SIGNS:  Blood pressure (!) 96/51, pulse 84, temperature 99.1 F (37.3 C), temperature source Oral, resp. rate 18, height 5' 6"  (1.676 m), weight 64.9 kg, SpO2 96 %.  I/O:    Intake/Output Summary (Last 24 hours) at 10/08/2019 1140 Last data filed at 10/08/2019 0944 Gross per 24 hour  Intake 2769.38 ml  Output -  Net 2769.38 ml    PHYSICAL EXAMINATION:  GENERAL:  58 y.o.-year-old patient lying in the bed with no acute distress.  EYES: Pupils equal, round, reactive to light and accommodation. No scleral icterus. Extraocular muscles intact.  HEENT: Head atraumatic, normocephalic. Oropharynx and nasopharynx clear.  NECK:  Supple, no jugular venous distention. No thyroid enlargement, no tenderness.  LUNGS: Normal breath sounds bilaterally, no wheezing, rales,rhonchi or crepitation. No use of accessory muscles of respiration.  CARDIOVASCULAR: S1, S2 normal. No murmurs, rubs, or gallops.  ABDOMEN: Soft, non-tender, non-distended. Bowel sounds present. No organomegaly or mass.  EXTREMITIES: No pedal edema, cyanosis, or clubbing.  NEUROLOGIC: Cranial nerves II through XII are intact. Muscle strength 5/5 in all extremities. Sensation intact. Gait not checked.  PSYCHIATRIC: The patient is alert and oriented x 3.  SKIN: No  obvious rash, lesion, or ulcer.   DATA REVIEW:   CBC  Recent Labs  Lab 10/08/19 0608  WBC 8.4  HGB 8.5*  HCT 25.2*  PLT 80*    Chemistries  Recent Labs  Lab 10/08/19 0608  NA 138  K 4.1  CL 107  CO2 22  GLUCOSE 195*  BUN 79*  CREATININE 2.38*  CALCIUM 8.6*  MG 2.3  AST 22  ALT 54*  ALKPHOS 221*  BILITOT 0.6    Microbiology Results   Recent Results (from the past 240 hour(s))  SARS CORONAVIRUS 2 (TAT 6-24 HRS) Nasopharyngeal Nasopharyngeal Swab     Status: None   Collection Time: 10/07/19  1:54 PM   Specimen: Nasopharyngeal Swab  Result Value Ref Range Status   SARS Coronavirus 2 NEGATIVE NEGATIVE Final    Comment: (NOTE) SARS-CoV-2 target nucleic acids are NOT DETECTED. The SARS-CoV-2 RNA is generally detectable in upper and lower respiratory specimens during the acute phase of infection. Negative results do not preclude SARS-CoV-2 infection, do not rule out co-infections with other pathogens, and should not be used as the sole basis for treatment or other patient management decisions. Negative results must be combined with clinical observations, patient history, and epidemiological information. The expected result is Negative.  Fact Sheet for Patients: SugarRoll.be Fact Sheet for Healthcare Providers: https://www.woods-mathews.com/ This test is not yet approved or cleared by the [place] FDA and  has been authorized for detection and/or diagnosis of SARS-CoV-2 by FDA under an Emergency Use Authorization (EUA). This EUA will remain  in effect (meaning this test can be used) for the duration of the COVID-19 declaration under Section 56 4(b)(1) of the Act, 21 U.S.C. section 360bbb-3(b)(1), unless the authorization is terminated or revoked sooner. Performed at [facility], [place] [address], [place], [place] [address]   C difficile quick scan w PCR reflex     Status: None   Collection Time: 10/07/19  6:57  PM   Specimen: STOOL  Result Value Ref Range Status   C Diff antigen NEGATIVE NEGATIVE Final   C Diff toxin NEGATIVE NEGATIVE Final   C Diff interpretation No C. difficile detected.  Final    Comment: Performed at [facility], [address] [place], [place] [address]  Gastrointestinal Panel by PCR , Stool     Status: None   Collection Time: 10/07/19  6:57 PM  Result Value Ref Range Status   Campylobacter species NOT DETECTED NOT DETECTED Final   Plesimonas shigelloides NOT DETECTED NOT DETECTED Final   Salmonella species NOT DETECTED NOT DETECTED Final   Yersinia enterocolitica NOT DETECTED NOT DETECTED Final   Vibrio species NOT DETECTED NOT DETECTED Final   Vibrio cholerae NOT DETECTED NOT DETECTED Final   Enteroaggregative E coli (EAEC) NOT DETECTED NOT DETECTED Final   Enteropathogenic E coli (EPEC) NOT DETECTED NOT DETECTED Final   Enterotoxigenic E coli (ETEC) NOT DETECTED NOT DETECTED Final   Shiga like toxin producing E coli (STEC) NOT DETECTED NOT DETECTED Final   Shigella/Enteroinvasive E coli (EIEC) NOT DETECTED NOT DETECTED Final   Cryptosporidium NOT DETECTED NOT DETECTED Final   Cyclospora cayetanensis NOT DETECTED NOT DETECTED Final   Entamoeba histolytica NOT DETECTED NOT DETECTED Final   Giardia lamblia NOT DETECTED NOT DETECTED Final   Adenovirus F40/41 NOT DETECTED NOT DETECTED Final   Astrovirus NOT DETECTED NOT DETECTED Final   Norovirus GI/GII NOT DETECTED NOT DETECTED Final   Rotavirus A NOT DETECTED NOT DETECTED Final   Sapovirus (I, II, IV, and V) NOT DETECTED NOT DETECTED Final    Comment: Performed at [facility], [address] [place],  [address]    RADIOLOGY:  Dg Chest Portable 1 View  Result Date: 10/07/2019 CLINICAL DATA:  High blood sugar EXAM: PORTABLE CHEST 1 VIEW COMPARISON:  04/14/2019 FINDINGS: Lateral right seventh and eighth rib fractures. There may be associated callus. There is no edema, consolidation,  effusion, or pneumothorax. Normal heart size and mediastinal contours. Artifact from EKG leads. IMPRESSION: 1. No evidence for pneumonia. 2. Right lateral seventh and eighth rib fractures could be acute or subacute. Electronically Signed   By: Monte Fantasia M.D.   On: 10/07/2019 10:48     CODE STATUS:     Code Status Orders  (From admission, onward)         Start     Ordered   10/07/19 1207  Full code  Continuous     10/07/19 1210        Code Status History    Date Active Date Inactive Code Status Order ID Comments User Context   08/05/2019 1608 08/07/2019 1840 Full Code [id number]  Hillary Bow, MD ED   04/14/2019 0542 04/16/2019 2009 Full Code [id number]  Mansy, Arvella Merles,  MD ED   12/13/2018 1213 12/15/2018 1602 Full Code [id number]  Nicholes Mango, MD Inpatient   12/09/2018 1344 12/10/2018 1935 Full Code [id number]  Dustin Flock, MD ED   Advance Care Planning Activity      TOTAL TIME TAKING CARE OF THIS PATIENT: *40* minutes.    Fritzi Mandes M.D on 10/08/2019 at 11:40 AM  Between 7am to 6pm - Pager - [phone] After 6pm go to [email or web address] - password [email or web address] [place] [facility]  Office  [phone]  CC: Primary care physician; Rochel Brome, MD

## 2019-10-08 NOTE — Progress Notes (Signed)
MD order received in [facility] to discharge pt home today; verbally reviewed AVS with pt; Rx escribed to [facility] in  [place], [place]; no questions voiced at this time; pt's discharge pending arrival of her ride home; pt to call her sister for a ride home, advised her to have them call when they are at the [place] entrance and we will bring her out in a wheelchair

## 2019-10-08 NOTE — Discharge Instructions (Signed)
Check your sugars 3 times a day and keep log of it

## 2019-10-08 NOTE — Care Management Obs Status (Signed)
MEDICARE OBSERVATION STATUS NOTIFICATION   Patient Details  Name: Susan Navarro MRN: [id number] Date of Birth: 06/23/1961   Medicare Observation Status Notification Given:  Yes    Onna Nodal A Jaclyn Carew, RN 10/08/2019, 3:28 PM

## 2019-10-12 LAB — OVA + PARASITE EXAM

## 2019-10-12 LAB — O&P RESULT

## 2019-12-25 ENCOUNTER — Other Ambulatory Visit: Payer: Self-pay

## 2019-12-25 ENCOUNTER — Inpatient Hospital Stay
Admission: EM | Admit: 2019-12-25 | Discharge: 2019-12-30 | DRG: 637 | Disposition: A | Discharge status: Home Health | Payer: Medicare HMO | Attending: Internal Medicine | Admitting: Internal Medicine

## 2019-12-25 ENCOUNTER — Emergency Department: Payer: Medicare HMO

## 2019-12-25 ENCOUNTER — Inpatient Hospital Stay: Payer: Medicare HMO

## 2019-12-25 ENCOUNTER — Encounter: Payer: Self-pay | Admitting: Emergency Medicine

## 2019-12-25 DIAGNOSIS — K7469 Other cirrhosis of liver: Secondary | ICD-10-CM | POA: Diagnosis present

## 2019-12-25 DIAGNOSIS — N184 Chronic kidney disease, stage 4 (severe): Secondary | ICD-10-CM | POA: Diagnosis present

## 2019-12-25 DIAGNOSIS — N1832 Chronic kidney disease, stage 3b: Secondary | ICD-10-CM | POA: Diagnosis present

## 2019-12-25 DIAGNOSIS — R531 Weakness: Secondary | ICD-10-CM | POA: Diagnosis not present

## 2019-12-25 DIAGNOSIS — E1100 Type 2 diabetes mellitus with hyperosmolarity without nonketotic hyperglycemic-hyperosmolar coma (NKHHC): Secondary | ICD-10-CM | POA: Diagnosis present

## 2019-12-25 DIAGNOSIS — R4182 Altered mental status, unspecified: Secondary | ICD-10-CM | POA: Diagnosis not present

## 2019-12-25 DIAGNOSIS — E1110 Type 2 diabetes mellitus with ketoacidosis without coma: Secondary | ICD-10-CM | POA: Diagnosis present

## 2019-12-25 DIAGNOSIS — G9341 Metabolic encephalopathy: Secondary | ICD-10-CM | POA: Diagnosis present

## 2019-12-25 DIAGNOSIS — E860 Dehydration: Secondary | ICD-10-CM | POA: Diagnosis present

## 2019-12-25 DIAGNOSIS — N179 Acute kidney failure, unspecified: Secondary | ICD-10-CM

## 2019-12-25 DIAGNOSIS — Z20822 Contact with and (suspected) exposure to covid-19: Secondary | ICD-10-CM | POA: Diagnosis present

## 2019-12-25 DIAGNOSIS — D638 Anemia in other chronic diseases classified elsewhere: Secondary | ICD-10-CM | POA: Diagnosis not present

## 2019-12-25 DIAGNOSIS — Z7982 Long term (current) use of aspirin: Secondary | ICD-10-CM

## 2019-12-25 DIAGNOSIS — D696 Thrombocytopenia, unspecified: Secondary | ICD-10-CM | POA: Diagnosis present

## 2019-12-25 DIAGNOSIS — E0865 Diabetes mellitus due to underlying condition with hyperglycemia: Secondary | ICD-10-CM | POA: Diagnosis not present

## 2019-12-25 DIAGNOSIS — R748 Abnormal levels of other serum enzymes: Secondary | ICD-10-CM | POA: Diagnosis not present

## 2019-12-25 DIAGNOSIS — E875 Hyperkalemia: Secondary | ICD-10-CM | POA: Diagnosis present

## 2019-12-25 DIAGNOSIS — Z79899 Other long term (current) drug therapy: Secondary | ICD-10-CM | POA: Diagnosis not present

## 2019-12-25 DIAGNOSIS — E1022 Type 1 diabetes mellitus with diabetic chronic kidney disease: Secondary | ICD-10-CM | POA: Diagnosis present

## 2019-12-25 DIAGNOSIS — E1029 Type 1 diabetes mellitus with other diabetic kidney complication: Secondary | ICD-10-CM | POA: Diagnosis present

## 2019-12-25 DIAGNOSIS — R7401 Elevation of levels of liver transaminase levels: Secondary | ICD-10-CM

## 2019-12-25 DIAGNOSIS — E871 Hypo-osmolality and hyponatremia: Secondary | ICD-10-CM | POA: Diagnosis present

## 2019-12-25 DIAGNOSIS — Z8673 Personal history of transient ischemic attack (TIA), and cerebral infarction without residual deficits: Secondary | ICD-10-CM | POA: Diagnosis not present

## 2019-12-25 DIAGNOSIS — E1010 Type 1 diabetes mellitus with ketoacidosis without coma: Secondary | ICD-10-CM

## 2019-12-25 DIAGNOSIS — D631 Anemia in chronic kidney disease: Secondary | ICD-10-CM | POA: Diagnosis present

## 2019-12-25 DIAGNOSIS — E785 Hyperlipidemia, unspecified: Secondary | ICD-10-CM | POA: Diagnosis present

## 2019-12-25 DIAGNOSIS — IMO0002 Reserved for concepts with insufficient information to code with codable children: Secondary | ICD-10-CM | POA: Diagnosis present

## 2019-12-25 DIAGNOSIS — E440 Moderate protein-calorie malnutrition: Secondary | ICD-10-CM | POA: Diagnosis present

## 2019-12-25 DIAGNOSIS — I129 Hypertensive chronic kidney disease with stage 1 through stage 4 chronic kidney disease, or unspecified chronic kidney disease: Secondary | ICD-10-CM | POA: Diagnosis present

## 2019-12-25 DIAGNOSIS — Z833 Family history of diabetes mellitus: Secondary | ICD-10-CM

## 2019-12-25 DIAGNOSIS — Z794 Long term (current) use of insulin: Secondary | ICD-10-CM

## 2019-12-25 LAB — CBC WITH DIFFERENTIAL/PLATELET
Abs Immature Granulocytes: 0.05 10*3/uL (ref 0.00–0.07)
Basophils Absolute: 0 10*3/uL (ref 0.0–0.1)
Basophils Relative: 0 %
Eosinophils Absolute: 0 10*3/uL (ref 0.0–0.5)
Eosinophils Relative: 0 %
HCT: 28.1 % — ABNORMAL LOW (ref 36.0–46.0)
Hemoglobin: 8.8 g/dL — ABNORMAL LOW (ref 12.0–15.0)
Immature Granulocytes: 1 %
Lymphocytes Relative: 6 %
Lymphs Abs: 0.5 10*3/uL — ABNORMAL LOW (ref 0.7–4.0)
MCH: 30.1 pg (ref 26.0–34.0)
MCHC: 31.3 g/dL (ref 30.0–36.0)
MCV: 96.2 fL (ref 80.0–100.0)
Monocytes Absolute: 0.3 10*3/uL (ref 0.1–1.0)
Monocytes Relative: 3 %
Neutro Abs: 8 10*3/uL — ABNORMAL HIGH (ref 1.7–7.7)
Neutrophils Relative %: 90 %
Platelets: 109 10*3/uL — ABNORMAL LOW (ref 150–400)
RBC: 2.92 MIL/uL — ABNORMAL LOW (ref 3.87–5.11)
RDW: 12.8 % (ref 11.5–15.5)
WBC: 8.8 10*3/uL (ref 4.0–10.5)
nRBC: 0 % (ref 0.0–0.2)

## 2019-12-25 LAB — COMPREHENSIVE METABOLIC PANEL
ALT: 42 U/L (ref 0–44)
AST: 20 U/L (ref 15–41)
Albumin: 3.7 g/dL (ref 3.5–5.0)
Alkaline Phosphatase: 313 U/L — ABNORMAL HIGH (ref 38–126)
Anion gap: 24 — ABNORMAL HIGH (ref 5–15)
BUN: 68 mg/dL — ABNORMAL HIGH (ref 6–20)
CO2: 15 mmol/L — ABNORMAL LOW (ref 22–32)
Calcium: 9.3 mg/dL (ref 8.9–10.3)
Chloride: 86 mmol/L — ABNORMAL LOW (ref 98–111)
Creatinine, Ser: 2.66 mg/dL — ABNORMAL HIGH (ref 0.44–1.00)
GFR calc Af Amer: 22 mL/min — ABNORMAL LOW (ref >60)
GFR calc non Af Amer: 19 mL/min — ABNORMAL LOW (ref >60)
Glucose, Bld: 1075 mg/dL (ref 70–99)
Potassium: 5.8 mmol/L — ABNORMAL HIGH (ref 3.5–5.1)
Sodium: 125 mmol/L — ABNORMAL LOW (ref 135–145)
Total Bilirubin: 1.3 mg/dL — ABNORMAL HIGH (ref 0.3–1.2)
Total Protein: 6.6 g/dL (ref 6.5–8.1)

## 2019-12-25 LAB — BLOOD GAS, VENOUS
Acid-base deficit: 8.7 mmol/L — ABNORMAL HIGH (ref 0.0–2.0)
Bicarbonate: 13.3 mmol/L — ABNORMAL LOW (ref 20.0–28.0)
O2 Saturation: 99.8 %
Patient temperature: 37
pCO2, Ven: 20 mmHg — ABNORMAL LOW (ref 44.0–60.0)
pH, Ven: 7.43 (ref 7.250–7.430)
pO2, Ven: 210 mmHg — ABNORMAL HIGH (ref 32.0–45.0)

## 2019-12-25 LAB — GLUCOSE, CAPILLARY
Glucose-Capillary: >600 mg/dL (ref 70–99)
Glucose-Capillary: >600 mg/dL (ref 70–99)
Glucose-Capillary: >600 mg/dL (ref 70–99)
Glucose-Capillary: >600 mg/dL (ref 70–99)
Glucose-Capillary: >600 mg/dL (ref 70–99)
Glucose-Capillary: >600 mg/dL (ref 70–99)

## 2019-12-25 LAB — POTASSIUM: Potassium: 5.6 mmol/L — ABNORMAL HIGH (ref 3.5–5.1)

## 2019-12-25 LAB — TROPONIN I (HIGH SENSITIVITY): Troponin I (High Sensitivity): 11 ng/L (ref <18)

## 2019-12-25 LAB — BETA-HYDROXYBUTYRIC ACID: Beta-Hydroxybutyric Acid: 5.79 mmol/L — ABNORMAL HIGH (ref 0.05–0.27)

## 2019-12-25 LAB — SODIUM: Sodium: 129 mmol/L — ABNORMAL LOW (ref 135–145)

## 2019-12-25 IMAGING — MR MR HEAD W/O CM
5 series · 40 of 48 positions shown · non-contrast
Comparison: Head CT same day

CLINICAL DATA: Encephalopathy.  Multiple old infarctions by CT.

EXAM:
MRI HEAD WITHOUT CONTRAST
TECHNIQUE: Multiplanar, multiecho pulse sequences of the brain and surrounding
structures were obtained without intravenous contrast.

[Series 2: ax dwi_tracew · axial · 3.0mm · 0.71mm/px · z∈[-56,+108]mm · 9 of 56 slices shown]
[im 1/56]
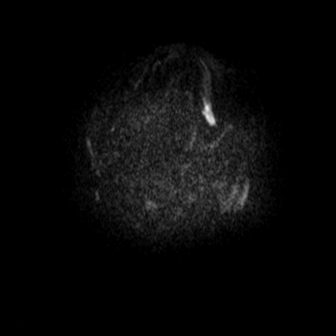
[im 11/56]
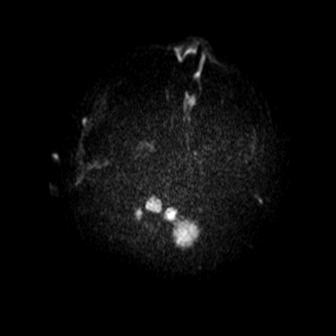
[im 16/56]
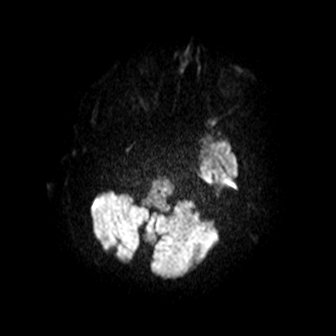
[im 26/56]
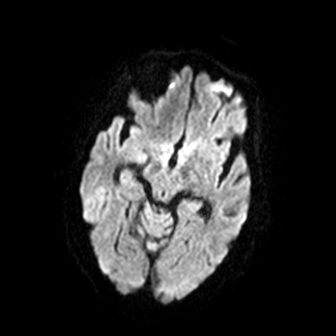
[im 31/56]
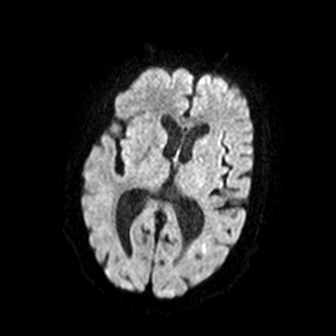
[im 41/56]
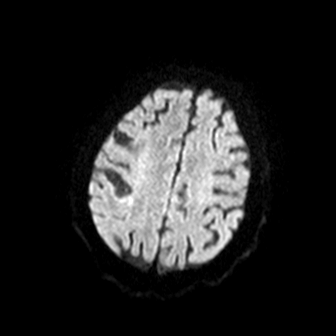
[im 46/56]
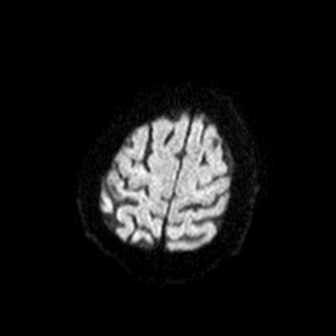
[im 51/56]
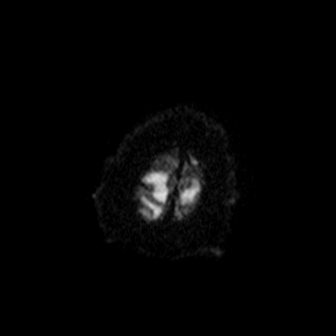
[im 56/56]
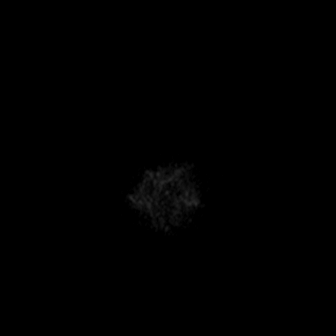

[Series 3: ax dwi_adc · axial · 3.0mm · 0.71mm/px · z∈[-56,+108]mm · 9 of 56 slices shown]
[im 1/56]
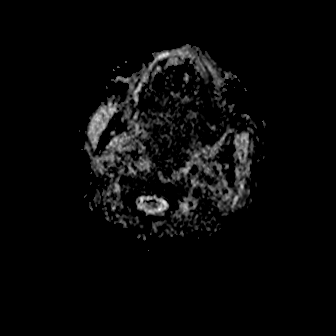
[im 11/56]
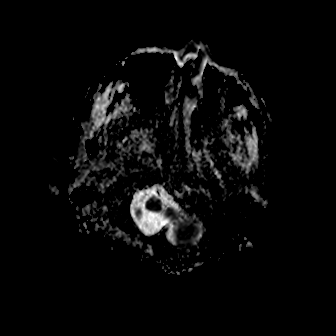
[im 16/56]
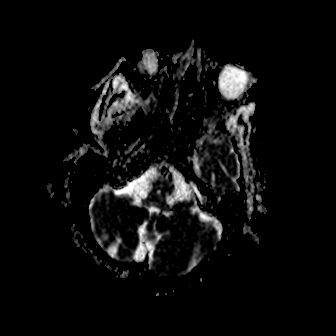
[im 26/56]
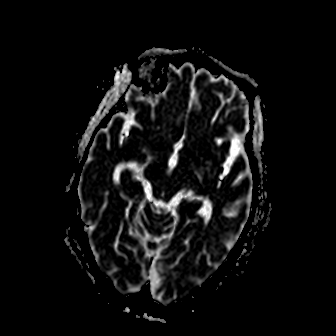
[im 31/56]
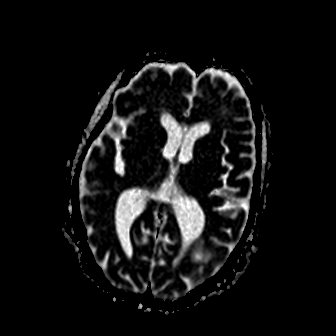
[im 41/56]
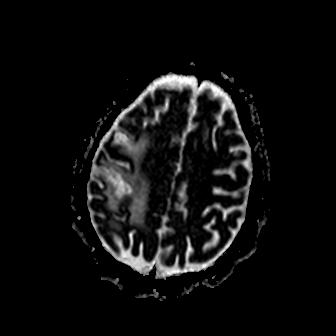
[im 46/56]
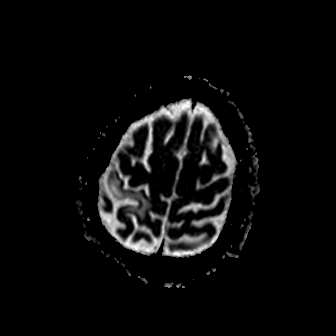
[im 51/56]
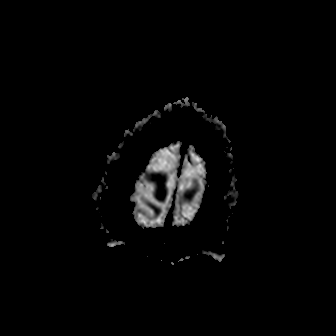
[im 56/56]
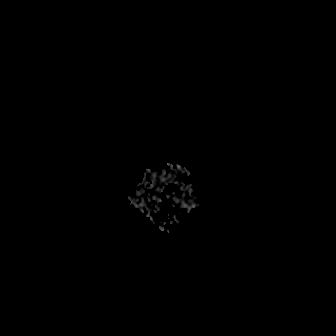

[Series 4: cor dwi_tracew · coronal · 5.0mm · 0.68mm/px · 9 of 40 slices shown]
[im 1/40]
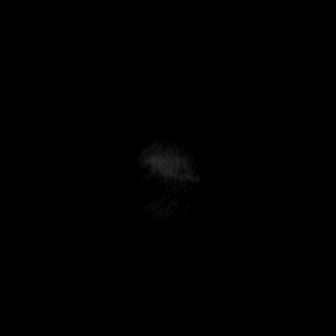
[im 5/40]
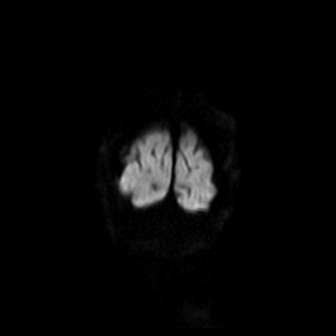
[im 10/40]
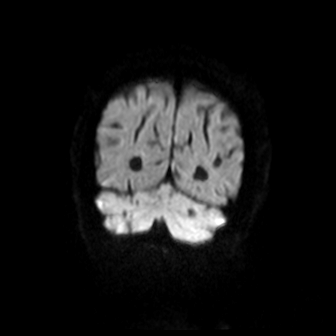
[im 15/40]
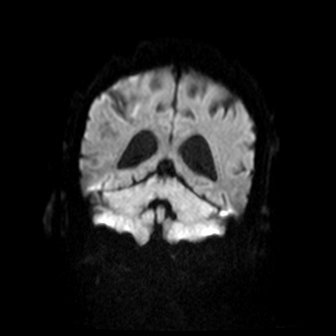
[im 20/40]
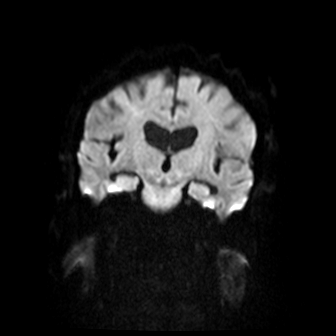
[im 25/40]
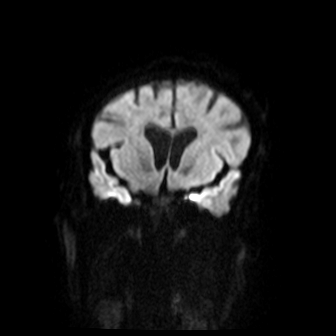
[im 30/40]
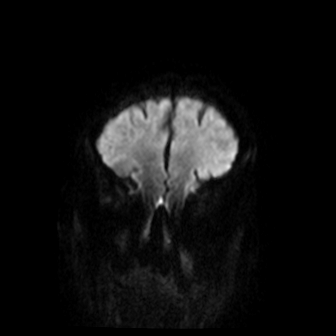
[im 35/40]
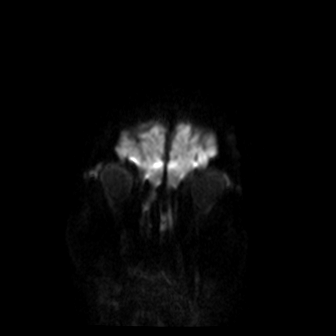
[im 40/40]
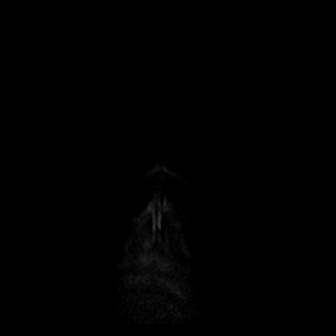

[Series 5: cor dwi_adc · coronal · 5.0mm · 0.68mm/px · 7 of 40 slices shown]
[im 1/40]
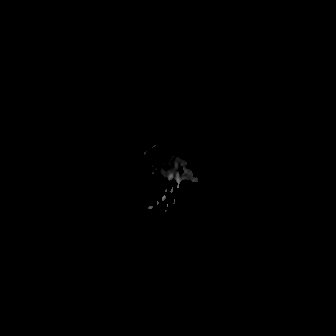
[im 5/40]
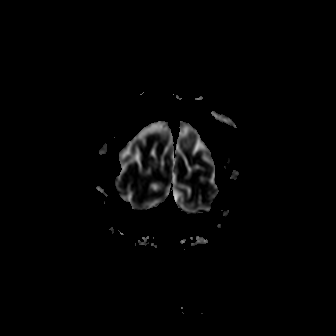
[im 10/40]
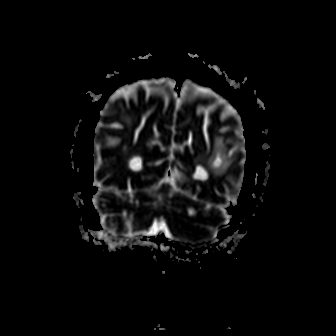
[im 15/40]
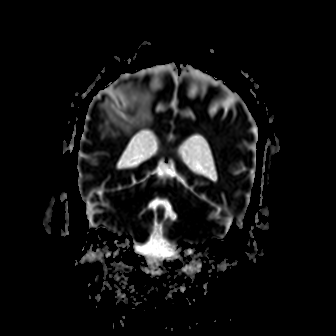
[im 25/40]
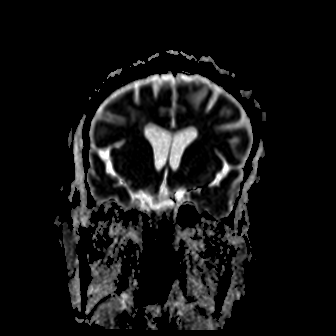
[im 30/40]
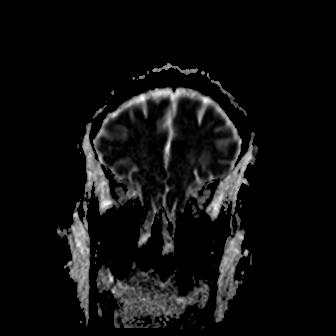
[im 35/40]
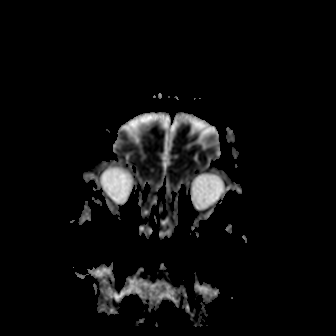

[Series 6: T2 · axial · 5.0mm · 0.45mm/px · z∈[-52,+103]mm · 6 of 27 slices shown]
[im 1/27]
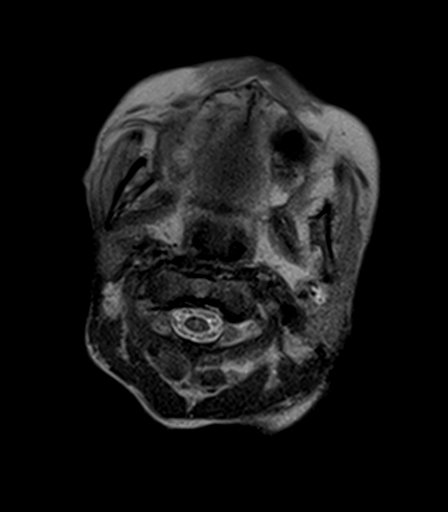
[im 6/27]
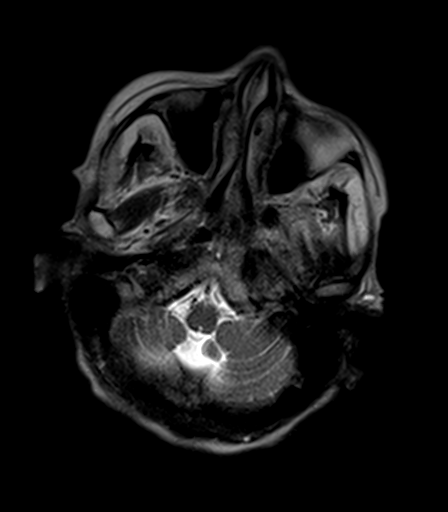
[im 11/27]
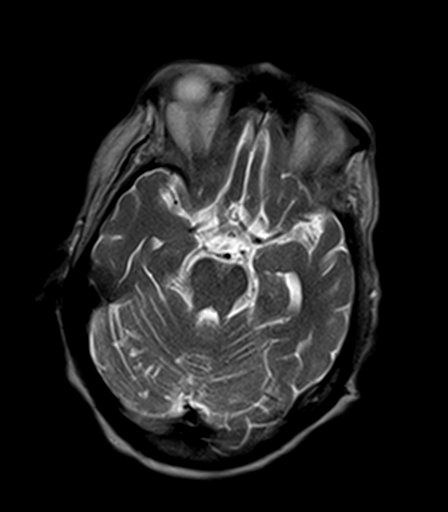
[im 16/27]
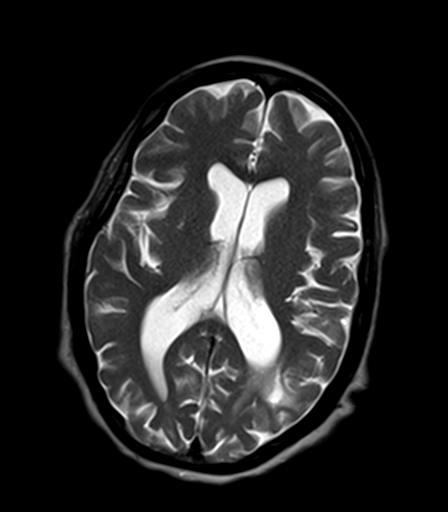
[im 21/27]
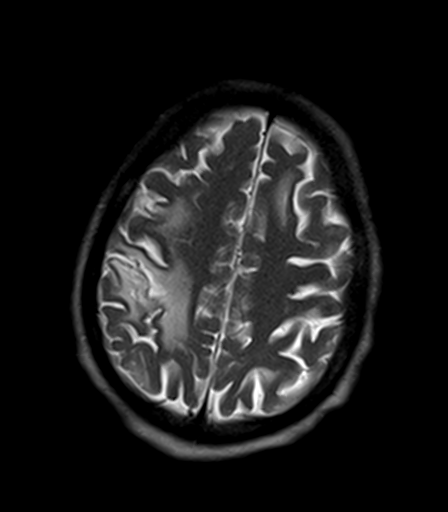
[im 27/27]
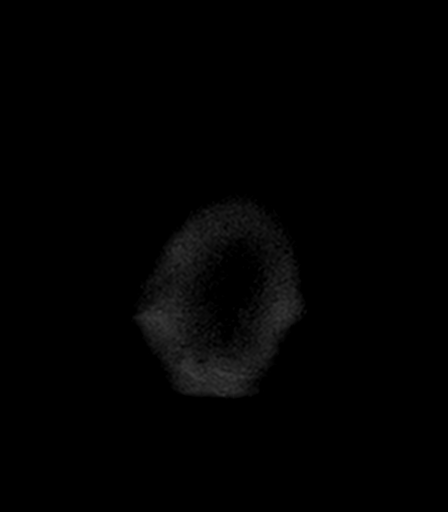

[40 of 48 positions shown; findings below may reference images not displayed]

FINDINGS: Brain: The patient would only allow diffusion imaging. Diffusion
imaging does not show any acute or subacute infarction. As shown by
CT, there are old small vessel cerebellar infarctions bilaterally,
and old left occipital cortical infarction, old left parietal
cortical infarction, old left frontal cortical infarction, and old
right frontoparietal cortical infarction. Some dystrophic
calcification associated with the right frontoparietal infarction.
No sign of mass lesion, recent hemorrhage, hydrocephalus or
extra-axial collection.

Vascular: Major vessels at the base of the brain show flow.

Skull and upper cervical spine: Negative

Sinuses/Orbits: Clear/normal

Other: None
IMPRESSION: Patient allowed only diffusion imaging to be performed. Diffusion
imaging does not show any acute or subacute infarction. Extensive
old infarctions throughout the brain as outlined above.

## 2019-12-25 IMAGING — CT CT HEAD W/O CM
3 series · 15 of 45 positions shown, 18 images · non-contrast
Comparison: None.

CLINICAL DATA: Encephalopathy

EXAM:
CT HEAD WITHOUT CONTRAST
TECHNIQUE: Contiguous axial images were obtained from the base of the skull
through the vertex without intravenous contrast.

[Series 2: head wo · axial · 0.47mm/px · z∈[-135,-20]mm · 9 of 28 slices shown, 12 images]
[im 3/28  brain]
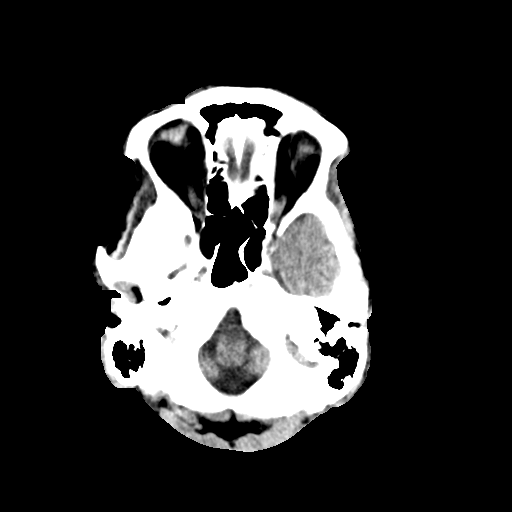
[im 3/28  bone]
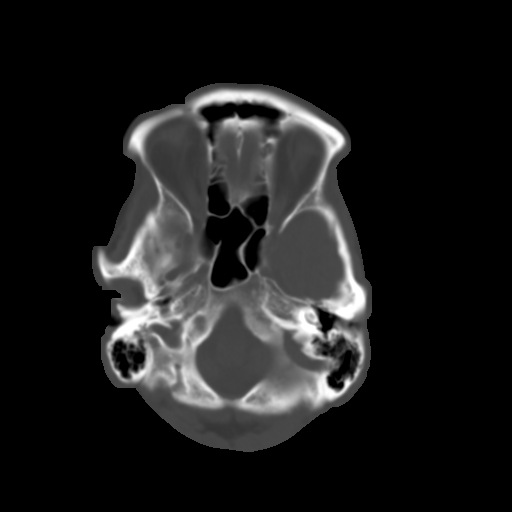
[im 6/28  brain]
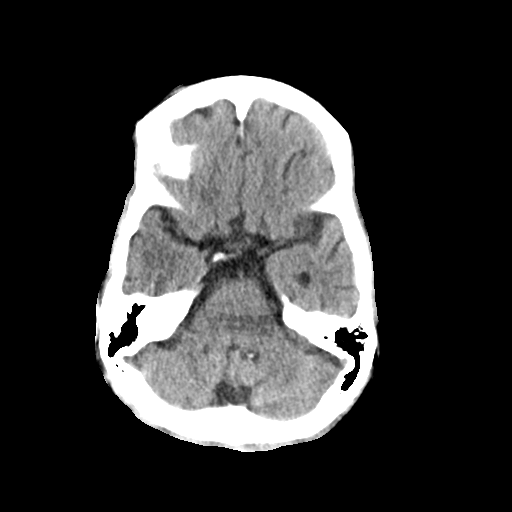
[im 9/28  brain]
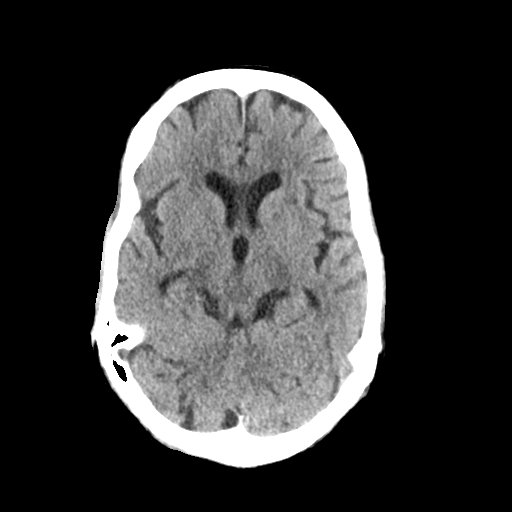
[im 12/28  brain]
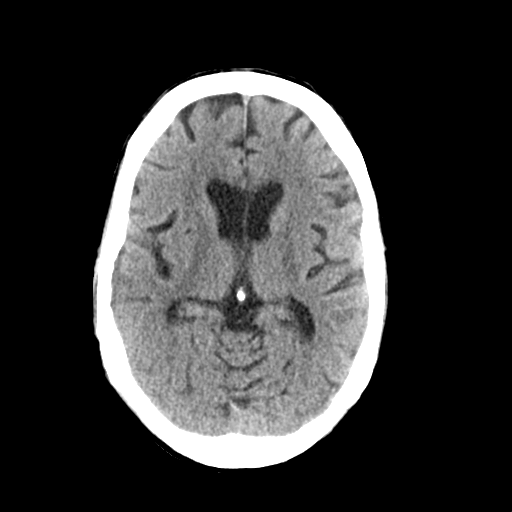
[im 15/28  brain]
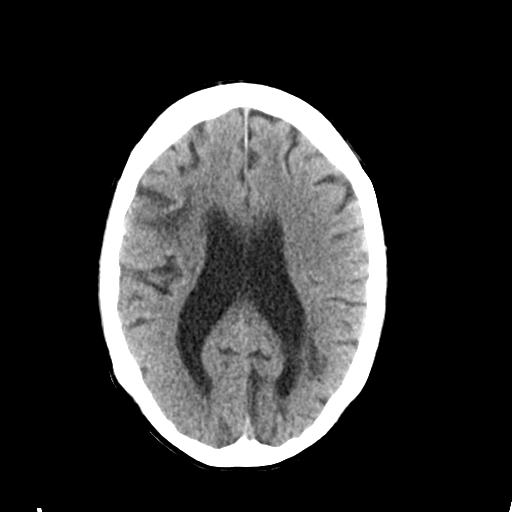
[im 15/28  bone]
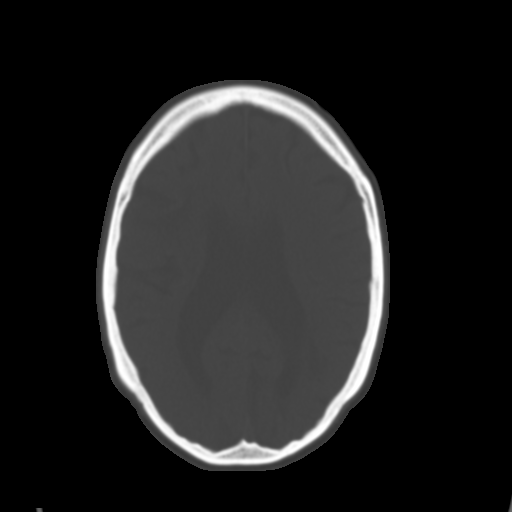
[im 17/28  brain]
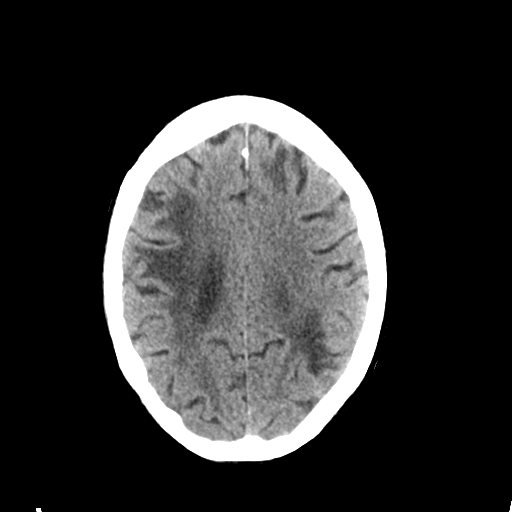
[im 20/28  brain]
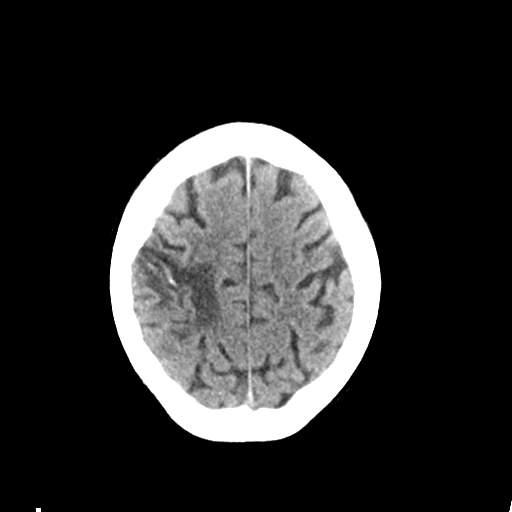
[im 23/28  brain]
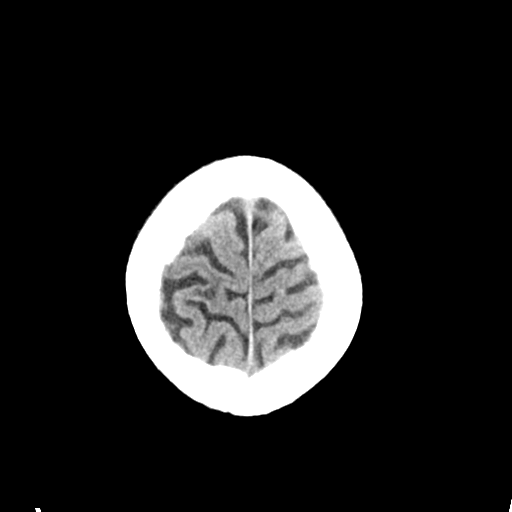
[im 26/28  brain]
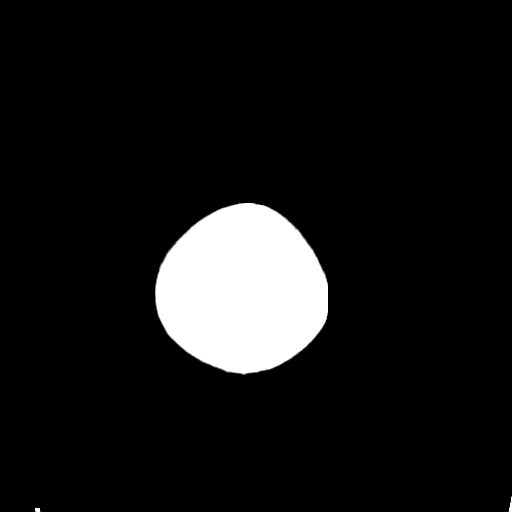
[im 26/28  bone]
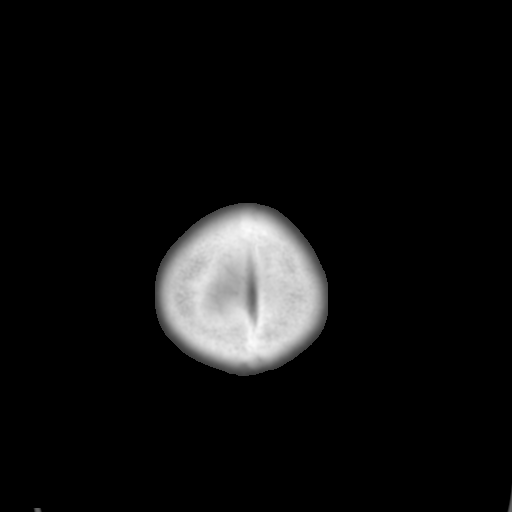

[Series 4: coronal soft tissue · coronal · 0.28mm/px · 3 of 66 slices shown]
[im 22/66  brain]
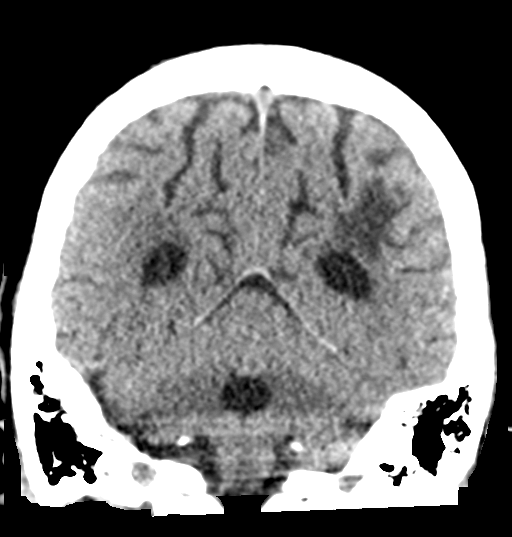
[im 29/66  brain]
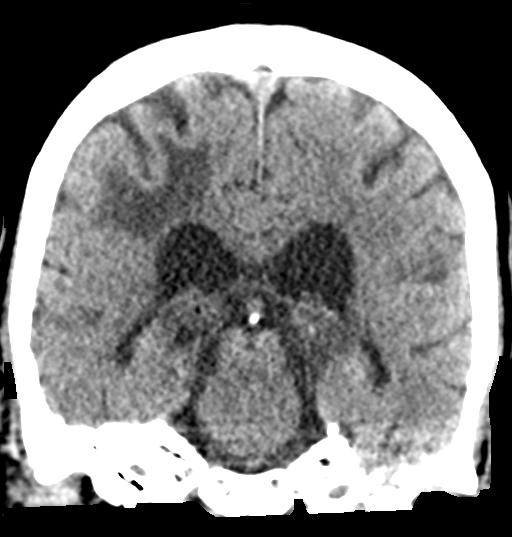
[im 37/66  brain]
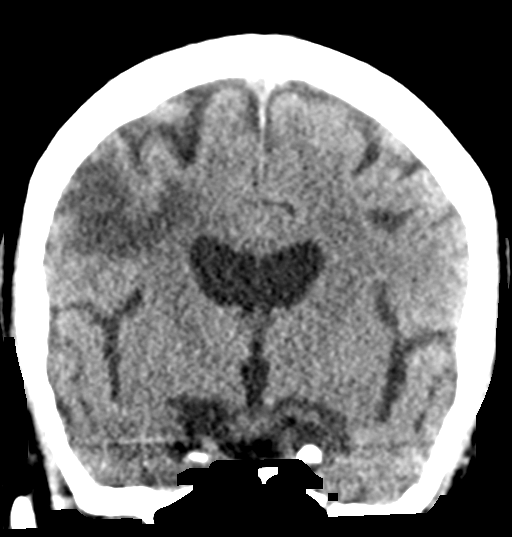

[Series 5: sagittal soft tissue · sagittal · 0.29mm/px · 3 of 48 slices shown]
[im 16/48  brain]
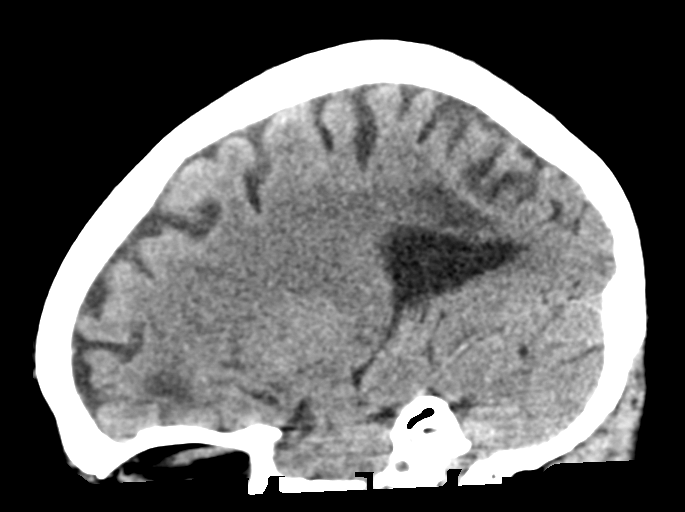
[im 24/48  brain]
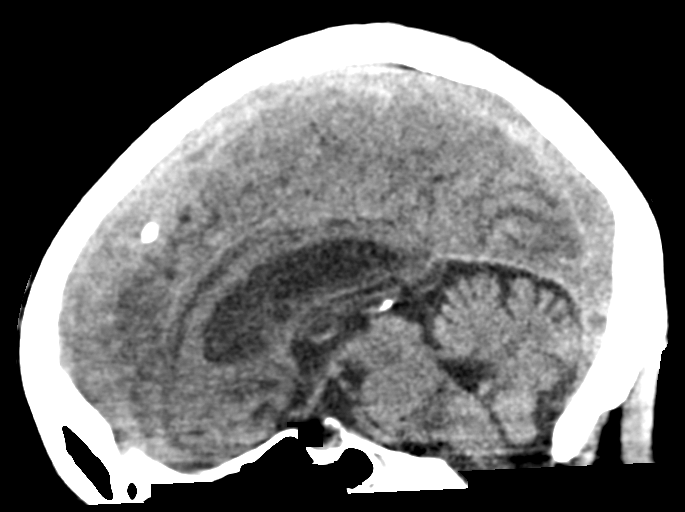
[im 32/48  brain]
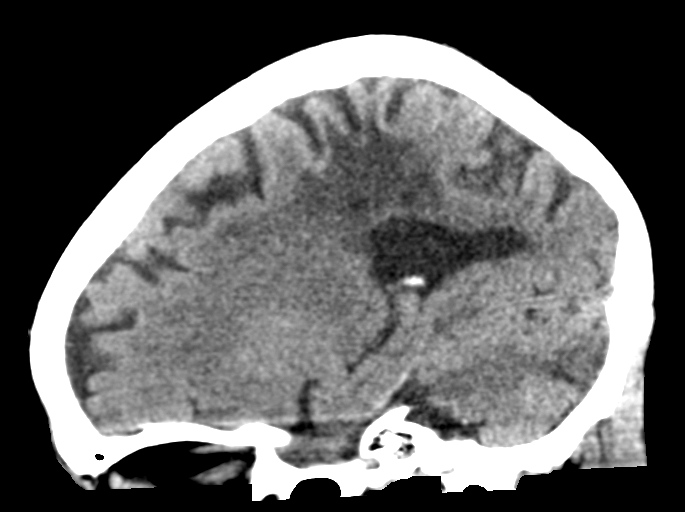

[15 of 45 positions shown; findings below may reference images not displayed]

FINDINGS: Brain: No definite evidence of acute infarction, hemorrhage,
hydrocephalus, extra-axial collection or mass lesion/mass effect.
There are multiple bilateral cerebral hypodensities, a large,
encephalomalacic appearing hypodensity of the frontoparietal right
hemisphere (series 2, image 19), of the left frontal pole (series 2,
image 70), and of the parietooccipital left hemisphere (series 2,
image 16).

Vascular: No hyperdense vessel or unexpected calcification.

Skull: Normal. Negative for fracture or focal lesion.

Sinuses/Orbits: No acute finding.

Other: None.
IMPRESSION: 1. Multiple bilateral cerebral hypodensities, a large,
encephalomalacic appearing hypodensity of the frontoparietal right
hemisphere, of the left frontal pole, and of the parietooccipital
left hemisphere. Findings are most consistent with multiple remote
infarcts.
2. Consider MR examination of the brain to better evaluate for acute
diffusion restricting infarction if suspected.

## 2019-12-25 MED ORDER — ONDANSETRON HCL 4 MG/2ML IJ SOLN: 4.0000 mg | Freq: Once | INTRAMUSCULAR | Status: AC

## 2019-12-25 MED ORDER — ONDANSETRON HCL 4 MG/2ML IJ SOLN: 4.0000 mg | Freq: Four times a day (QID) | INTRAMUSCULAR | Status: DC | PRN

## 2019-12-25 MED ORDER — INSULIN REGULAR(HUMAN) IN NACL 100-0.9 UT/100ML-% IV SOLN
INTRAVENOUS | Status: DC
  Administered 2019-12-25: 6 [IU]/h via INTRAVENOUS
  Filled 2019-12-25: qty 100

## 2019-12-25 MED ORDER — ASPIRIN 81 MG PO CHEW
81.0000 mg | CHEWABLE_TABLET | Freq: Every day | ORAL | Status: DC
  Administered 2019-12-26 – 2019-12-30 (×4): 81 mg via ORAL
  Filled 2019-12-25 (×4): qty 1

## 2019-12-25 MED ORDER — SENNOSIDES-DOCUSATE SODIUM 8.6-50 MG PO TABS: 1.0000 | ORAL_TABLET | Freq: Every evening | ORAL | Status: DC | PRN

## 2019-12-25 MED ORDER — ENOXAPARIN SODIUM 40 MG/0.4ML ~~LOC~~ SOLN: 40.0000 mg | SUBCUTANEOUS | Status: DC

## 2019-12-25 MED ORDER — SODIUM CHLORIDE 0.9 % IV SOLN: INTRAVENOUS | Status: DC

## 2019-12-25 MED ORDER — SODIUM CHLORIDE 0.9 % IV SOLN
1000.0000 mL | Freq: Once | INTRAVENOUS | Status: AC
  Administered 2019-12-25: 1000 mL via INTRAVENOUS

## 2019-12-25 MED ORDER — ATORVASTATIN CALCIUM 80 MG PO TABS
80.0000 mg | ORAL_TABLET | Freq: Every day | ORAL | Status: DC
  Administered 2019-12-26 – 2019-12-27 (×2): 80 mg via ORAL
  Filled 2019-12-25: qty 4
  Filled 2019-12-25 (×2): qty 1

## 2019-12-25 MED ORDER — ONDANSETRON HCL 4 MG/2ML IJ SOLN
INTRAMUSCULAR | Status: AC
  Administered 2019-12-25: 4 mg via INTRAVENOUS
  Filled 2019-12-25: qty 2

## 2019-12-25 MED ORDER — SODIUM CHLORIDE 0.9% FLUSH
3.0000 mL | Freq: Two times a day (BID) | INTRAVENOUS | Status: DC
  Administered 2019-12-26 – 2019-12-28 (×2): 3 mL via INTRAVENOUS

## 2019-12-25 MED ORDER — TRAMADOL HCL 50 MG PO TABS: 50.0000 mg | ORAL_TABLET | Freq: Four times a day (QID) | ORAL | Status: DC | PRN

## 2019-12-25 MED ORDER — ONDANSETRON HCL 4 MG PO TABS: 4.0000 mg | ORAL_TABLET | Freq: Four times a day (QID) | ORAL | Status: DC | PRN

## 2019-12-25 MED ORDER — METOPROLOL TARTRATE 25 MG PO TABS
25.0000 mg | ORAL_TABLET | Freq: Two times a day (BID) | ORAL | Status: DC
  Administered 2019-12-26 – 2019-12-30 (×7): 25 mg via ORAL
  Filled 2019-12-25 (×7): qty 1

## 2019-12-25 NOTE — ED Triage Notes (Signed)
Pt via EMS from home. Per EMS, pt here for altered mental status. Pt is very lethargic on arrival. Per EMS, pt is diabetic and doesn't know the last time she took her medication and she does not check her sugar. On EMS glucose meter, sugar read "HIGH". Pt started vomiting on the ride here.

## 2019-12-25 NOTE — ED Provider Notes (Addendum)
[facility] Emergency Department Provider Note       Time seen: ----------------------------------------- 3:54 PM on 12/25/2019 ----------------------------------------- Level V caveat: History/ROS limited by altered mental status  I have reviewed the triage vital signs and the nursing notes.  HISTORY   Chief Complaint No chief complaint on file.    HPI Susan Navarro is a 59 y.o. female with a history of anemia, chronic kidney disease, diabetes, hypertension, CVA who presents to the ED for altered mental status.  Patient reportedly was brought from home for confusion and high blood sugars.  It is unknown when she checked her sugar or took her medicines last.  She cannot give further review of systems or report.  Past Medical History:  Diagnosis Date  . Anemia   . Chronic kidney disease   . Diabetes mellitus without complication ([place])   . Hypertension   . Stroke [facility]) 03/2018   Ischemic stroke per [facility] records    Patient Active Problem List   Diagnosis Date Noted  . DKA (diabetic ketoacidoses) ([place]) 12/09/2018    No past surgical history on file.  Allergies Patient has no known allergies.  Social History Social History   Tobacco Use  . Smoking status: Never Smoker  . Smokeless tobacco: Never Used  Substance Use Topics  . Alcohol use: No  . Drug use: Not on file    Review of Systems Unknown, patient is confused  All systems negative/normal/unremarkable except as stated in the HPI  ____________________________________________   PHYSICAL EXAM:  VITAL SIGNS: ED Triage Vitals  Enc Vitals Group     BP      Pulse      Resp      Temp      Temp src      SpO2      Weight      Height      Head Circumference      Peak Flow      Pain Score      Pain Loc      Pain Edu?      Excl. in [place]?    Constitutional: Alert but disoriented.  Mild distress Eyes: Conjunctivae are normal. Normal extraocular movements. ENT      Head:  Normocephalic and atraumatic.      Nose: No congestion/rhinnorhea.      Mouth/Throat: Mucous membranes are moist.      Neck: No stridor. Cardiovascular: Normal rate, regular rhythm. No murmurs, rubs, or gallops. Respiratory: Normal respiratory effort without tachypnea nor retractions. Breath sounds are clear and equal bilaterally. No wheezes/rales/rhonchi. Gastrointestinal: Soft and nontender. Normal bowel sounds Musculoskeletal: Nontender with normal range of motion in extremities. No lower extremity tenderness nor edema. Neurologic:  Normal speech and language. No gross focal neurologic deficits are appreciated.  Skin:  Skin is warm, dry and intact. No rash noted. Psychiatric: Mood and affect are normal.  Confused ____________________________________________  EKG: Interpreted by me.  Sinus tachycardia with rate of 106 bpm, possible septal infarct age-indeterminate, normal axis, nonspecific ST changes, normal QT  ____________________________________________  ED COURSE:  As part of my medical decision making, I reviewed the following data within the [place] History obtained from family if available, nursing notes, old chart and ekg, as well as notes from prior ED visits. Patient presented for altered mental status and hyperglycemia, we will assess with labs and imaging as indicated at this time.   Procedures  Susan Navarro was evaluated in Emergency Department on 12/25/2019  for the symptoms described in the history of present illness. She was evaluated in the context of the global COVID-19 pandemic, which necessitated consideration that the patient might be at risk for infection with the SARS-CoV-2 virus that causes COVID-19. Institutional protocols and algorithms that pertain to the evaluation of patients at risk for COVID-19 are in a state of rapid change based on information released by regulatory bodies including the CDC and federal and state organizations. These  policies and algorithms were followed during the patient's care in the ED.  ____________________________________________   LABS (pertinent positives/negatives)  Labs Reviewed  CBC WITH DIFFERENTIAL/PLATELET - Abnormal; Notable for the following components:      Result Value   RBC 2.92 (*)    Hemoglobin 8.8 (*)    HCT 28.1 (*)    Platelets 109 (*)    Neutro Abs 8.0 (*)    Lymphs Abs 0.5 (*)    All other components within normal limits  COMPREHENSIVE METABOLIC PANEL - Abnormal; Notable for the following components:   Sodium 125 (*)    Potassium 5.8 (*)    Chloride 86 (*)    CO2 15 (*)    Glucose, Bld 1,075 (*)    BUN 68 (*)    Creatinine, Ser 2.66 (*)    Alkaline Phosphatase 313 (*)    Total Bilirubin 1.3 (*)    GFR calc non Af Amer 19 (*)    GFR calc Af Amer 22 (*)    Anion gap 24 (*)    All other components within normal limits  BETA-HYDROXYBUTYRIC ACID - Abnormal; Notable for the following components:   Beta-Hydroxybutyric Acid 5.79 (*)    All other components within normal limits  GLUCOSE, CAPILLARY - Abnormal; Notable for the following components:   Glucose-Capillary >600 (*)    All other components within normal limits  URINALYSIS, COMPLETE (UACMP) WITH MICROSCOPIC  BLOOD GAS, VENOUS  CBG MONITORING, ED  TROPONIN I (HIGH SENSITIVITY)   CRITICAL CARE Performed by: Laurence Aly   Total critical care time: 30 minutes  Critical care time was exclusive of separately billable procedures and treating other patients.  Critical care was necessary to treat or prevent imminent or life-threatening deterioration.  Critical care was time spent personally by me on the following activities: development of treatment plan with patient and/or surrogate as well as nursing, discussions with consultants, evaluation of patient's response to treatment, examination of patient, obtaining history from patient or surrogate, ordering and performing treatments and interventions,  ordering and review of laboratory studies, ordering and review of radiographic studies, pulse oximetry and re-evaluation of patient's condition.  RADIOLOGY Images were viewed by me  CT head IMPRESSION: 1. Multiple bilateral cerebral hypodensities, a large, encephalomalacic appearing hypodensity of the frontoparietal right hemisphere, of the left frontal pole, and of the parietooccipital left hemisphere. 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 2. Consider MR examination of the brain to better evaluate for acute diffusion restricting infarction if suspected. ____________________________________________   DIFFERENTIAL DIAGNOSIS   DKA, HH NK, dehydration, electrolyte abnormality, CVA  FINAL ASSESSMENT AND PLAN  Altered mental status, DKA, chronic kidney disease, CVA   Plan: The patient had presented for altered mental status. Patient's labs did reveal an elevated anion gap acidosis with a blood sugar over thousand.  This is likely severe DKA as well as a hyperosmolar state.  She has chronic kidney disease as well.  Patient's imaging has not revealed any acute process.  We have started her on  insulin drip as well as given IV fluids.  Imaging likely indicated multiple areas of cerebral infarction likely secondary to her untreated blood sugar.  She will require MR imaging at some point.  I will discuss with the hospitalist for admission.   Laurence Aly, MD    Note: This note was generated in part or whole with voice recognition software. Voice recognition is usually quite accurate but there are transcription errors that can and very often do occur. I apologize for any typographical errors that were not detected and corrected.     Earleen Newport, MD 12/25/19 1713    Earleen Newport, MD 12/25/19 1715    Earleen Newport, MD 12/25/19 [phone]

## 2019-12-25 NOTE — ED Notes (Signed)
Patient transported to CT 

## 2019-12-25 NOTE — H&P (Signed)
History and Physical    Susan Navarro IZT:[id number] DOB: [birth year] DOA: 12/25/2019  PCP: Rochel Brome, MD  Patient coming from: home    Chief Complaint: nausea & vomiting  HPI: 59 y/o F w/ PMH of DM1 uncontrolled, HTN, HLD who presents w/ nausea and vomiting x day of admission. Pt did not take her insulin for approx 1 week b/c "she was not feeling good." Pt denies any blood in her vomit. Pt denies any dizziness, fever, chills, sweating, chest pain, shortness of breath, abd pain, dysuria, urinary urgency, urinary frequency, diarrhea, or constipation. Of note, CT brain in the ER showed multiple remote infarcts. Pt is alert to person and place.     Review of Systems: As per HPI otherwise 10 point review of systems negative.   Past Medical History:  Diagnosis Date  . Anemia   . Chronic kidney disease   . Diabetes mellitus without complication ([place])   . Hypertension   . Stroke [facility]) 03/2018   Ischemic stroke per [facility] records    History reviewed. No pertinent surgical history. Pt denies any past hx of surgery    reports that she has never smoked. She has never used smokeless tobacco. She reports that she does not drink alcohol. No history on file for drug.  No Known Allergies  Family History  Problem Relation Age of Onset  . Diabetes Mother   . Diabetes Brother      Prior to Admission medications   Medication Sig Start Date End Date Taking? Authorizing Provider  amLODipine (NORVASC) 5 MG tablet Take 5 mg by mouth daily. 12/15/19  Yes [provider]  aspirin EC 81 MG tablet Take 81 mg by mouth daily. 02/09/17  Yes [provider]  atorvastatin (LIPITOR) 80 MG tablet Take 80 mg by mouth every evening. 11/23/18 12/25/19 Yes [provider]  Insulin Glargine (LANTUS SOLOSTAR) 100 UNIT/ML Solostar Pen Inject 10 Units into the skin daily.    Yes [provider]  insulin lispro (HUMALOG KWIKPEN) 100 UNIT/ML KwikPen Inject 0.1 mLs (10 Units  total) into the skin 3 (three) times daily before meals. 10/08/19 10/07/20 Yes Fritzi Mandes, MD  lisinopril (ZESTRIL) 2.5 MG tablet Take 2.5 mg by mouth every evening.    Yes [provider]  metoprolol tartrate (LOPRESSOR) 25 MG tablet Take 25 mg by mouth 2 (two) times daily.    Yes [provider]    Physical Exam: Vitals:   12/25/19 1606 12/25/19 1611 12/25/19 1636  BP:  130/61   Pulse:  (!) 104   Resp:  18   Temp:   99.4 F (37.4 C)  TempSrc:   Oral  SpO2:  100%   Weight: 65.8 kg    Height: 5' 6"  (1.676 m)      Constitutional: NAD, calm, comfortable Vitals:   12/25/19 1606 12/25/19 1611 12/25/19 1636  BP:  130/61   Pulse:  (!) 104   Resp:  18   Temp:   99.4 F (37.4 C)  TempSrc:   Oral  SpO2:  100%   Weight: 65.8 kg    Height: 5' 6"  (1.676 m)     Eyes: PERRL, lids and conjunctivae normal ENMT: Mucous membranes are dry. Posterior pharynx clear of any exudate or lesions. Neck: normal, supple,  Respiratory: diminished breath sounds b/l, no wheezing, no crackles. Normal respiratory effort.  Cardiovascular: Normal S1,S2. no rubs / gallops. No extremity edema.  Abdomen: Soft, ND. No tenderness,.  Bowel sounds positive.  Musculoskeletal:  no clubbing / cyanosis. No joint deformity upper and lower extremities. Good ROM.Marland Kitchen Normal muscle tone.  Skin: no rashes, lesions, ulcers.  Neurologic: CN 2-12 grossly intact. Sensation intact. Decreased strength of b/l LE   Psychiatric: abnormal judgment and insight. Alert and oriented x person & place. Agitated   Labs on Admission: I have personally reviewed following labs and imaging studies  CBC: Recent Labs  Lab 12/25/19 1555  WBC 8.8  NEUTROABS 8.0*  HGB 8.8*  HCT 28.1*  MCV 96.2  PLT 831*   Basic Metabolic Panel: Recent Labs  Lab 12/25/19 1555  NA 125*  K 5.8*  CL 86*  CO2 15*  GLUCOSE 1,075*  BUN 68*  CREATININE 2.66*  CALCIUM 9.3   GFR: Estimated Creatinine Clearance: 21.3 mL/min (A) (by C-G  formula based on SCr of 2.66 mg/dL (H)). Liver Function Tests: Recent Labs  Lab 12/25/19 1555  AST 20  ALT 42  ALKPHOS 313*  BILITOT 1.3*  PROT 6.6  ALBUMIN 3.7   No results for input(s): LIPASE, AMYLASE in the last 168 hours. No results for input(s): AMMONIA in the last 168 hours. Coagulation Profile: No results for input(s): INR, PROTIME in the last 168 hours. Cardiac Enzymes: No results for input(s): CKTOTAL, CKMB, CKMBINDEX, TROPONINI in the last 168 hours. BNP (last 3 results) No results for input(s): PROBNP in the last 8760 hours. HbA1C: No results for input(s): HGBA1C in the last 72 hours. CBG: Recent Labs  Lab 12/25/19 1608  GLUCAP >600*   Lipid Profile: No results for input(s): CHOL, HDL, LDLCALC, TRIG, CHOLHDL, LDLDIRECT in the last 72 hours. Thyroid Function Tests: No results for input(s): TSH, T4TOTAL, FREET4, T3FREE, THYROIDAB in the last 72 hours. Anemia Panel: No results for input(s): VITAMINB12, FOLATE, FERRITIN, TIBC, IRON, RETICCTPCT in the last 72 hours. Urine analysis:    Component Value Date/Time   COLORURINE YELLOW (A) 08/05/2019 1401   APPEARANCEUR CLOUDY (A) 08/05/2019 1401   LABSPEC 1.014 08/05/2019 1401   PHURINE 5.0 08/05/2019 1401   GLUCOSEU >=500 (A) 08/05/2019 1401   HGBUR SMALL (A) 08/05/2019 1401   BILIRUBINUR NEGATIVE 08/05/2019 1401   KETONESUR 20 (A) 08/05/2019 1401   PROTEINUR NEGATIVE 08/05/2019 1401   NITRITE NEGATIVE 08/05/2019 1401   LEUKOCYTESUR MODERATE (A) 08/05/2019 1401    Radiological Exams on Admission: CT Head Wo Contrast  Result Date: 12/25/2019 CLINICAL DATA:  Encephalopathy EXAM: CT HEAD WITHOUT CONTRAST TECHNIQUE: Contiguous axial images were obtained from the base of the skull through the vertex without intravenous contrast. COMPARISON:  None. FINDINGS: Brain: No definite evidence of acute infarction, hemorrhage, hydrocephalus, extra-axial collection or mass lesion/mass effect. There are multiple bilateral  cerebral hypodensities, a large, encephalomalacic appearing hypodensity of the frontoparietal right hemisphere (series 2, image 19), of the left frontal pole (series 2, image 70), and of the parietooccipital left hemisphere (series 2, image 16). Vascular: No hyperdense vessel or unexpected calcification. Skull: Normal. Negative for fracture or focal lesion. Sinuses/Orbits: No acute finding. Other: None. IMPRESSION: 1. Multiple bilateral cerebral hypodensities, a large, encephalomalacic appearing hypodensity of the frontoparietal right hemisphere, of the left frontal pole, and of the parietooccipital left hemisphere. 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 2. Consider MR examination of the brain to better evaluate for acute diffusion restricting infarction if suspected. Electronically Signed   By: Eddie Candle M.D.   On: 12/25/2019 17:46    EKG: Independently reviewed.   Assessment/Plan Active Problems:   DKA (diabetic ketoacidoses) ([place])  DKA: continue on  IV insulin drip protocal. Anion gap is 24.0. Continue on IVFs. NPO except w/ sips w/ meds. DM coordinator consulted  DM1: poorly controlled. HbA1c 12.2 in Oct 2020. Continue on IV insulin drip   Hyperkalemia: will recheck K level this evening. Insulin drip will drive potassium in cells. Continue on tele.  Hyponatremia: likely secondary to DKA. Continue on IVFs. Repeat Na level this evening  Acute metabolic encephalopathy: likely secondary to DKA. CT brain shows remote infarcts. Will order MRI brain   CKDIV: baseline Cr unknown. Currently stage IV. Continue on IVFs. 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 No need for a transfusion at this time. Will continue to monitor  Thrombocytopenia: etiology unclear. Will continue to monitor   HLD: continue on statin   HTN: will continue on home dose of metoprolol, hold for MAP <65 and/or HR <65. Hold home dose of amlodipine and  lisinopril      DVT prophylaxis: lovenox Code Status: full  Family Communication:  Disposition Plan:  Consults called: none Admission status: inpatient  Wyvonnia Dusky MD [facility] Pager [phone]   If 7PM-7AM, please contact night-coverage [email or web address] Password [facility]  12/25/2019, 5:48 PM

## 2019-12-25 NOTE — ED Notes (Signed)
Date and time results received: 12/25/19 1654  Test: Glucose Critical Value: 1075  Name of Provider Notified: Dr. Jimmye Norman, MD

## 2019-12-26 ENCOUNTER — Other Ambulatory Visit: Payer: Self-pay

## 2019-12-26 ENCOUNTER — Encounter: Payer: Self-pay | Admitting: Internal Medicine

## 2019-12-26 DIAGNOSIS — R4182 Altered mental status, unspecified: Secondary | ICD-10-CM | POA: Insufficient documentation

## 2019-12-26 DIAGNOSIS — G9341 Metabolic encephalopathy: Secondary | ICD-10-CM | POA: Insufficient documentation

## 2019-12-26 LAB — COMPREHENSIVE METABOLIC PANEL
ALT: 41 U/L (ref 0–44)
AST: 23 U/L (ref 15–41)
Albumin: 3.4 g/dL — ABNORMAL LOW (ref 3.5–5.0)
Alkaline Phosphatase: 270 U/L — ABNORMAL HIGH (ref 38–126)
Anion gap: 14 (ref 5–15)
BUN: 78 mg/dL — ABNORMAL HIGH (ref 6–20)
CO2: 22 mmol/L (ref 22–32)
Calcium: 9 mg/dL (ref 8.9–10.3)
Chloride: 98 mmol/L (ref 98–111)
Creatinine, Ser: 2.96 mg/dL — ABNORMAL HIGH (ref 0.44–1.00)
GFR calc Af Amer: 19 mL/min — ABNORMAL LOW (ref >60)
GFR calc non Af Amer: 17 mL/min — ABNORMAL LOW (ref >60)
Glucose, Bld: 644 mg/dL (ref 70–99)
Potassium: 4.1 mmol/L (ref 3.5–5.1)
Sodium: 134 mmol/L — ABNORMAL LOW (ref 135–145)
Total Bilirubin: 0.6 mg/dL (ref 0.3–1.2)
Total Protein: 6 g/dL — ABNORMAL LOW (ref 6.5–8.1)

## 2019-12-26 LAB — BASIC METABOLIC PANEL
Anion gap: 12 (ref 5–15)
Anion gap: 13 (ref 5–15)
BUN: 79 mg/dL — ABNORMAL HIGH (ref 6–20)
BUN: 80 mg/dL — ABNORMAL HIGH (ref 6–20)
CO2: 22 mmol/L (ref 22–32)
CO2: 25 mmol/L (ref 22–32)
Calcium: 8.8 mg/dL — ABNORMAL LOW (ref 8.9–10.3)
Calcium: 9.2 mg/dL (ref 8.9–10.3)
Chloride: 97 mmol/L — ABNORMAL LOW (ref 98–111)
Chloride: 100 mmol/L (ref 98–111)
Creatinine, Ser: 2.96 mg/dL — ABNORMAL HIGH (ref 0.44–1.00)
Creatinine, Ser: 3.06 mg/dL — ABNORMAL HIGH (ref 0.44–1.00)
GFR calc Af Amer: 19 mL/min — ABNORMAL LOW (ref >60)
GFR calc Af Amer: 18 mL/min — ABNORMAL LOW (ref >60)
GFR calc non Af Amer: 17 mL/min — ABNORMAL LOW (ref >60)
GFR calc non Af Amer: 16 mL/min — ABNORMAL LOW (ref >60)
Glucose, Bld: 773 mg/dL (ref 70–99)
Glucose, Bld: 167 mg/dL — ABNORMAL HIGH (ref 70–99)
Potassium: 4.1 mmol/L (ref 3.5–5.1)
Potassium: 4.2 mmol/L (ref 3.5–5.1)
Sodium: 131 mmol/L — ABNORMAL LOW (ref 135–145)
Sodium: 138 mmol/L (ref 135–145)

## 2019-12-26 LAB — GLUCOSE, CAPILLARY
Glucose-Capillary: >600 mg/dL (ref 70–99)
Glucose-Capillary: >600 mg/dL (ref 70–99)
Glucose-Capillary: >600 mg/dL (ref 70–99)
Glucose-Capillary: 597 mg/dL (ref 70–99)
Glucose-Capillary: 519 mg/dL (ref 70–99)
Glucose-Capillary: 440 mg/dL — ABNORMAL HIGH (ref 70–99)
Glucose-Capillary: 291 mg/dL — ABNORMAL HIGH (ref 70–99)
Glucose-Capillary: 160 mg/dL — ABNORMAL HIGH (ref 70–99)
Glucose-Capillary: 132 mg/dL — ABNORMAL HIGH (ref 70–99)
Glucose-Capillary: 189 mg/dL — ABNORMAL HIGH (ref 70–99)
Glucose-Capillary: 170 mg/dL — ABNORMAL HIGH (ref 70–99)
Glucose-Capillary: 153 mg/dL — ABNORMAL HIGH (ref 70–99)
Glucose-Capillary: 89 mg/dL (ref 70–99)
Glucose-Capillary: 103 mg/dL — ABNORMAL HIGH (ref 70–99)

## 2019-12-26 LAB — MAGNESIUM: Magnesium: 2 mg/dL (ref 1.7–2.4)

## 2019-12-26 LAB — URINALYSIS, COMPLETE (UACMP) WITH MICROSCOPIC
Bilirubin Urine: NEGATIVE
Glucose, UA: >=500 mg/dL — AB
Hgb urine dipstick: NEGATIVE
Ketones, ur: 5 mg/dL — AB
Leukocytes,Ua: NEGATIVE
Nitrite: NEGATIVE
Protein, ur: NEGATIVE mg/dL
Specific Gravity, Urine: 1.016 (ref 1.005–1.030)
pH: 5 (ref 5.0–8.0)

## 2019-12-26 LAB — CBC
HCT: 24.5 % — ABNORMAL LOW (ref 36.0–46.0)
Hemoglobin: 8.3 g/dL — ABNORMAL LOW (ref 12.0–15.0)
MCH: 30.2 pg (ref 26.0–34.0)
MCHC: 33.9 g/dL (ref 30.0–36.0)
MCV: 89.1 fL (ref 80.0–100.0)
Platelets: 100 10*3/uL — ABNORMAL LOW (ref 150–400)
RBC: 2.75 MIL/uL — ABNORMAL LOW (ref 3.87–5.11)
RDW: 12.7 % (ref 11.5–15.5)
WBC: 9.6 10*3/uL (ref 4.0–10.5)
nRBC: 0 % (ref 0.0–0.2)

## 2019-12-26 LAB — PHOSPHORUS: Phosphorus: 2.8 mg/dL (ref 2.5–4.6)

## 2019-12-26 LAB — HIV ANTIBODY (ROUTINE TESTING W REFLEX): HIV Screen 4th Generation wRfx: NONREACTIVE

## 2019-12-26 MED ORDER — INSULIN ASPART 100 UNIT/ML ~~LOC~~ SOLN: 0.0000 [IU] | Freq: Every day | SUBCUTANEOUS | Status: DC

## 2019-12-26 MED ORDER — INSULIN ASPART 100 UNIT/ML ~~LOC~~ SOLN
0.0000 [IU] | Freq: Every day | SUBCUTANEOUS | Status: DC
  Administered 2019-12-28 – 2019-12-29 (×2): 3 [IU] via SUBCUTANEOUS
  Filled 2019-12-26 (×2): qty 1

## 2019-12-26 MED ORDER — ENOXAPARIN SODIUM 40 MG/0.4ML ~~LOC~~ SOLN
30.0000 mg | SUBCUTANEOUS | Status: DC
  Administered 2019-12-26 – 2019-12-29 (×4): 30 mg via SUBCUTANEOUS
  Filled 2019-12-26 (×4): qty 0.4

## 2019-12-26 MED ORDER — INSULIN GLARGINE 100 UNIT/ML ~~LOC~~ SOLN
10.0000 [IU] | SUBCUTANEOUS | Status: DC
  Administered 2019-12-26 – 2019-12-28 (×3): 10 [IU] via SUBCUTANEOUS
  Filled 2019-12-26 (×5): qty 0.1

## 2019-12-26 MED ORDER — INSULIN ASPART 100 UNIT/ML ~~LOC~~ SOLN: 0.0000 [IU] | Freq: Three times a day (TID) | SUBCUTANEOUS | Status: DC

## 2019-12-26 MED ORDER — INSULIN REGULAR(HUMAN) IN NACL 100-0.9 UT/100ML-% IV SOLN
INTRAVENOUS | Status: DC
  Administered 2019-12-26: 7 [IU]/h via INTRAVENOUS
  Filled 2019-12-26: qty 100

## 2019-12-26 MED ORDER — DEXTROSE 50 % IV SOLN: 0.0000 mL | INTRAVENOUS | Status: DC | PRN

## 2019-12-26 MED ORDER — INSULIN ASPART 100 UNIT/ML ~~LOC~~ SOLN
0.0000 [IU] | Freq: Three times a day (TID) | SUBCUTANEOUS | Status: DC
  Administered 2019-12-26: 2 [IU] via SUBCUTANEOUS
  Administered 2019-12-27: 1 [IU] via SUBCUTANEOUS
  Administered 2019-12-27: 3 [IU] via SUBCUTANEOUS
  Administered 2019-12-27: 2 [IU] via SUBCUTANEOUS
  Administered 2019-12-28: 7 [IU] via SUBCUTANEOUS
  Administered 2019-12-28: 3 [IU] via SUBCUTANEOUS
  Administered 2019-12-28: 9 [IU] via SUBCUTANEOUS
  Administered 2019-12-29: 7 [IU] via SUBCUTANEOUS
  Administered 2019-12-29 – 2019-12-30 (×2): 9 [IU] via SUBCUTANEOUS
  Administered 2019-12-30: 5 [IU] via SUBCUTANEOUS
  Filled 2019-12-26 (×11): qty 1

## 2019-12-26 MED ORDER — INSULIN ASPART 100 UNIT/ML ~~LOC~~ SOLN
2.0000 [IU] | Freq: Three times a day (TID) | SUBCUTANEOUS | Status: DC
  Administered 2019-12-26 – 2019-12-28 (×7): 2 [IU] via SUBCUTANEOUS
  Filled 2019-12-26 (×7): qty 1

## 2019-12-26 NOTE — ED Notes (Addendum)
Spoke with MD Rip Harbour. Per RN DC and instructions for pt's plan of care given. Insulin clamped, Per RN Anderson Malta DC continue hourly BS checks and adjust according to Endo Tool and start Dextrose and collect any needed labs.   Pt given meal tray at this time per MD Admit Jimmye Norman and wait on dextrose until he says. Check BS after meal eaten.

## 2019-12-26 NOTE — Progress Notes (Signed)
Inpatient Diabetes Program Recommendations  AACE/ADA: New Consensus Statement on Inpatient Glycemic Control (2015)  Target Ranges:  Prepandial:   less than 140 mg/dL      Peak postprandial:   less than 180 mg/dL (1-2 hours)      Critically ill patients:  140 - 180 mg/dL   Lab Results  Component Value Date   GLUCAP 160 (H) 12/26/2019   HGBA1C 12.2 (H) 10/08/2019    Review of Glycemic Control Results for Susan Navarro, Susan Navarro (MRN [id number]) as of 12/26/2019 09:35  Ref. Range 12/26/2019 02:05 12/26/2019 03:12 12/26/2019 03:47 12/26/2019 04:50 12/26/2019 06:08 12/26/2019 08:19  Glucose-Capillary Latest Ref Range: 70 - 99 mg/dL >600 (HH) >600 (HH) 597 (HH) 519 (HH) 440 (H) 291 (H)   Diabetes history: Type 1 DM  Outpatient Diabetes medications:  Lantus 10 units daily, Humalog 3 units tid with meals Current orders for Inpatient glycemic control:  IV insulin- DKA order set  Inpatient Diabetes Program Recommendations:    Blood sugar now 160 mg/dL.  EndoTool has instructed RN to turn insulin drip off for next hour. Once acidosis cleared, will need basal insulin 1-2 hours prior to d/c of insulin drip.  Consider Lantus 10 units daily.   Called and discussed with MD.  Orders received for stat BMP, Lantus 10 units daily, Novolog sensitive tid with meals and HS and Novolog meal coverage 2 units tid with meals as well.     Thanks,  Adah Perl, RN, BC-ADM Inpatient Diabetes Coordinator Pager [phone] (8a-5p)

## 2019-12-26 NOTE — ED Notes (Signed)
Pt given crackers, peanut butter and some applesauce at this time.

## 2019-12-26 NOTE — ED Notes (Signed)
Admit MD Williams at bedside and updated on latest with pt at this time. Pt's level of care to be changed depending on pt coming off the drip

## 2019-12-26 NOTE — ED Notes (Signed)
Spoke with Anderson Malta DC RN and informed to give Lantus and then overlap insulin drip by about an hour according to Endo tool. Also update on BMP results when back.

## 2019-12-26 NOTE — ED Notes (Signed)
Messaged Admit MD Elyn Aquas RN DC regarding BS for pt

## 2019-12-26 NOTE — ED Notes (Signed)
Per MD and RN Anderson Malta DC restart drip after BS check and continue until insulin is brought up. New orders for BMP placed.

## 2019-12-26 NOTE — ED Notes (Signed)
ED TO INPATIENT HANDOFF REPORT  ED Nurse Name and Phone #: Joelene Millin [phone]  S Name/Age/Gender Susan Navarro 59 y.o. female Room/Bed: ED05A/ED05A  Code Status   Code Status: Full Code  Home/SNF/Other Home Patient oriented to: self, place, time and situation Is this baseline? Yes   Triage Complete: Triage complete  Chief Complaint Hyperosmolar hyperglycemic state (HHS) ([place]) [E11.00, E11.65] DKA (diabetic ketoacidoses) ([place]) [E11.10]  Triage Note Pt via EMS from home. Per EMS, pt here for altered mental status. Pt is very lethargic on arrival. Per EMS, pt is diabetic and doesn't know the last time she took her medication and she does not check her sugar. On EMS glucose meter, sugar read "HIGH". Pt started vomiting on the ride here.     Allergies No Known Allergies  Level of Care/Admitting Diagnosis ED Disposition    ED Disposition Condition [facility] Area: [place] [[id number]]  Level of Care: Med-Surg [16]  Covid Evaluation: Asymptomatic Screening Protocol (No Symptoms)  Diagnosis: Hyperosmolar hyperglycemic state (HHS) [facility]) [[id number]]  Admitting Physician: Wyvonnia Dusky [[id number]]  Attending Physician: Wyvonnia Dusky [[id number]]  Estimated length of stay: past midnight tomorrow  Certification:: I certify this patient will need inpatient services for at least 2 midnights       B Medical/Surgery History Past Medical History:  Diagnosis Date  . Anemia   . Chronic kidney disease   . Diabetes mellitus without complication ([place])   . Hypertension   . Stroke [facility]) 03/2018   Ischemic stroke per [facility] records   History reviewed. No pertinent surgical history.   A IV Location/Drains/Wounds Patient Lines/Drains/Airways Status   Active Line/Drains/Airways    Name:   Placement date:   Placement time:   Site:   Days:   Peripheral IV 12/25/19 Right External jugular   12/25/19    1616    External jugular   1   Peripheral IV  12/25/19 Right;Anterior Forearm   12/25/19    1839    Forearm   1   Wound / Incision (Open or Dehisced) 12/09/18 Diabetic ulcer Foot Right;Other (Comment);Posterior dark purple   12/09/18    1500    Foot   382          Intake/Output Last 24 hours  Intake/Output Summary (Last 24 hours) at 12/26/2019 2132 Last data filed at 12/26/2019 1657 Gross per 24 hour  Intake 1460.72 ml  Output --  Net 1460.72 ml    Labs/Imaging Results for orders placed or performed during the hospital encounter of 12/25/19 (from the past 48 hour(s))  CBC with Differential     Status: Abnormal   Collection Time: 12/25/19  3:55 PM  Result Value Ref Range   WBC 8.8 4.0 - 10.5 K/uL   RBC 2.92 (L) 3.87 - 5.11 MIL/uL   Hemoglobin 8.8 (L) 12.0 - 15.0 g/dL   HCT 28.1 (L) 36.0 - 46.0 %   MCV 96.2 80.0 - 100.0 fL   MCH 30.1 26.0 - 34.0 pg   MCHC 31.3 30.0 - 36.0 g/dL   RDW 12.8 11.5 - 15.5 %   Platelets 109 (L) 150 - 400 K/uL    Comment: Immature Platelet Fraction may be clinically indicated, consider ordering this additional test [id number]    nRBC 0.0 0.0 - 0.2 %   Neutrophils Relative % 90 %   Neutro Abs 8.0 (H) 1.7 - 7.7 K/uL   Lymphocytes Relative 6 %   Lymphs Abs 0.5 (L) 0.7 -  4.0 K/uL   Monocytes Relative 3 %   Monocytes Absolute 0.3 0.1 - 1.0 K/uL   Eosinophils Relative 0 %   Eosinophils Absolute 0.0 0.0 - 0.5 K/uL   Basophils Relative 0 %   Basophils Absolute 0.0 0.0 - 0.1 K/uL   Immature Granulocytes 1 %   Abs Immature Granulocytes 0.05 0.00 - 0.07 K/uL    Comment: Performed at [facility], [address] [place], [place] [address]  Comprehensive metabolic panel     Status: Abnormal   Collection Time: 12/25/19  3:55 PM  Result Value Ref Range   Sodium 125 (L) 135 - 145 mmol/L    Comment: ELECTROLYTES REPEATED   Potassium 5.8 (H) 3.5 - 5.1 mmol/L   Chloride 86 (L) 98 - 111 mmol/L   CO2 15 (L) 22 - 32 mmol/L   Glucose, Bld 1,075 (HH) 70 - 99 mg/dL    Comment: CRITICAL RESULT CALLED  TO, READ BACK BY AND VERIFIED WITH ASHLEY MURRAY @1648  ON 12/25/2019 BY FMW    BUN 68 (H) 6 - 20 mg/dL   Creatinine, Ser 2.66 (H) 0.44 - 1.00 mg/dL   Calcium 9.3 8.9 - 10.3 mg/dL   Total Protein 6.6 6.5 - 8.1 g/dL   Albumin 3.7 3.5 - 5.0 g/dL   AST 20 15 - 41 U/L   ALT 42 0 - 44 U/L   Alkaline Phosphatase 313 (H) 38 - 126 U/L   Total Bilirubin 1.3 (H) 0.3 - 1.2 mg/dL   GFR calc non Af Amer 19 (L) >60 mL/min   GFR calc Af Amer 22 (L) >60 mL/min   Anion gap 24 (H) 5 - 15    Comment: Performed at [facility], [address], [place], [place] [address]  Troponin I (High Sensitivity)     Status: None   Collection Time: 12/25/19  3:55 PM  Result Value Ref Range   Troponin I (High Sensitivity) 11 <18 ng/L    Comment: (NOTE) Elevated high sensitivity troponin I (hsTnI) values and significant  changes across serial measurements may suggest ACS but many other  chronic and acute conditions are known to elevate hsTnI results.  Refer to the "Links" section for chest pain algorithms and additional  guidance. Performed at [facility], [address] [place], [place] [address]   Blood gas, venous     Status: Abnormal   Collection Time: 12/25/19  3:55 PM  Result Value Ref Range   pH, Ven 7.43 7.250 - 7.430   pCO2, Ven 20 (L) 44.0 - 60.0 mmHg   pO2, Ven 210.0 (H) 32.0 - 45.0 mmHg   Bicarbonate 13.3 (L) 20.0 - 28.0 mmol/L   Acid-base deficit 8.7 (H) 0.0 - 2.0 mmol/L   O2 Saturation 99.8 %   Patient temperature 37.0    Collection site VEIN    Sample type VEIN     Comment: Performed at [facility], [address], [place], [place] [address]  Beta-hydroxybutyric acid     Status: Abnormal   Collection Time: 12/25/19  3:55 PM  Result Value Ref Range   Beta-Hydroxybutyric Acid 5.79 (H) 0.05 - 0.27 mmol/L    Comment: RESULT CONFIRMED BY MANUAL DILUTION FMW Performed at [facility], [address] [place], [place] [address]   Glucose, capillary      Status: Abnormal   Collection Time: 12/25/19  4:08 PM  Result Value Ref Range   Glucose-Capillary >600 (HH) 70 - 99 mg/dL  Glucose, capillary  Status: Abnormal   Collection Time: 12/25/19  7:38 PM  Result Value Ref Range   Glucose-Capillary >600 (HH) 70 - 99 mg/dL  Glucose, capillary     Status: Abnormal   Collection Time: 12/25/19  8:14 PM  Result Value Ref Range   Glucose-Capillary >600 (HH) 70 - 99 mg/dL  Glucose, capillary     Status: Abnormal   Collection Time: 12/25/19  9:04 PM  Result Value Ref Range   Glucose-Capillary >600 (HH) 70 - 99 mg/dL  HIV Antibody (routine testing w rflx)     Status: None   Collection Time: 12/25/19  9:06 PM  Result Value Ref Range   HIV Screen 4th Generation wRfx NON REACTIVE NON REACTIVE    Comment: Performed at [facility], [place] [address], [place], [place] [address]  Sodium     Status: Abnormal   Collection Time: 12/25/19  9:06 PM  Result Value Ref Range   Sodium 129 (L) 135 - 145 mmol/L    Comment: Performed at [facility], [address] [place], [place] [address]  Potassium     Status: Abnormal   Collection Time: 12/25/19  9:06 PM  Result Value Ref Range   Potassium 5.6 (H) 3.5 - 5.1 mmol/L    Comment: Performed at [facility], [address] [place], [place] [address]  Glucose, capillary     Status: Abnormal   Collection Time: 12/25/19  9:42 PM  Result Value Ref Range   Glucose-Capillary >600 (HH) 70 - 99 mg/dL  Glucose, capillary     Status: Abnormal   Collection Time: 12/25/19 10:33 PM  Result Value Ref Range   Glucose-Capillary >600 (HH) 70 - 99 mg/dL  Glucose, capillary     Status: Abnormal   Collection Time: 12/26/19  1:03 AM  Result Value Ref Range   Glucose-Capillary >600 (HH) 70 - 99 mg/dL  Basic metabolic panel     Status: Abnormal   Collection Time: 12/26/19  1:52 AM  Result Value Ref Range   Sodium 131 (L) 135 - 145 mmol/L   Potassium 4.1 3.5 - 5.1 mmol/L   Chloride 97 (L) 98 - 111  mmol/L   CO2 22 22 - 32 mmol/L   Glucose, Bld 773 (HH) 70 - 99 mg/dL    Comment: CRITICAL RESULT CALLED TO, READ BACK BY AND VERIFIED WITH NOEL WEBSTER AT 0313 ON 12/26/19 RWW    BUN 79 (H) 6 - 20 mg/dL   Creatinine, Ser 2.96 (H) 0.44 - 1.00 mg/dL   Calcium 8.8 (L) 8.9 - 10.3 mg/dL   GFR calc non Af Amer 17 (L) >60 mL/min   GFR calc Af Amer 19 (L) >60 mL/min   Anion gap 12 5 - 15    Comment: Performed at [facility], [address] [place], [place] [address]  Glucose, capillary     Status: Abnormal   Collection Time: 12/26/19  2:05 AM  Result Value Ref Range   Glucose-Capillary >600 (HH) 70 - 99 mg/dL  Glucose, capillary     Status: Abnormal   Collection Time: 12/26/19  3:12 AM  Result Value Ref Range   Glucose-Capillary >600 (HH) 70 - 99 mg/dL  Glucose, capillary     Status: Abnormal   Collection Time: 12/26/19  3:47 AM  Result Value Ref Range   Glucose-Capillary 597 (HH) 70 - 99 mg/dL   Comment 1 Document in Chart   CBC     Status: Abnormal   Collection Time:  12/26/19  4:13 AM  Result Value Ref Range   WBC 9.6 4.0 - 10.5 K/uL   RBC 2.75 (L) 3.87 - 5.11 MIL/uL   Hemoglobin 8.3 (L) 12.0 - 15.0 g/dL   HCT 24.5 (L) 36.0 - 46.0 %   MCV 89.1 80.0 - 100.0 fL    Comment: REPEATED TO VERIFY   MCH 30.2 26.0 - 34.0 pg   MCHC 33.9 30.0 - 36.0 g/dL   RDW 12.7 11.5 - 15.5 %   Platelets 100 (L) 150 - 400 K/uL    Comment: REPEATED TO VERIFY SPECIMEN CHECKED FOR CLOTS Immature Platelet Fraction may be clinically indicated, consider ordering this additional test [id number]    nRBC 0.0 0.0 - 0.2 %    Comment: Performed at [facility], [address] [place], [place] [address]  Comprehensive metabolic panel     Status: Abnormal   Collection Time: 12/26/19  4:13 AM  Result Value Ref Range   Sodium 134 (L) 135 - 145 mmol/L   Potassium 4.1 3.5 - 5.1 mmol/L   Chloride 98 98 - 111 mmol/L   CO2 22 22 - 32 mmol/L   Glucose, Bld 644 (HH) 70 - 99 mg/dL    Comment:  CRITICAL RESULT CALLED TO, READ BACK BY AND VERIFIED WITH NOEL WEBSTER 12/26/19 0515 SJL    BUN 78 (H) 6 - 20 mg/dL   Creatinine, Ser 2.96 (H) 0.44 - 1.00 mg/dL   Calcium 9.0 8.9 - 10.3 mg/dL   Total Protein 6.0 (L) 6.5 - 8.1 g/dL   Albumin 3.4 (L) 3.5 - 5.0 g/dL   AST 23 15 - 41 U/L   ALT 41 0 - 44 U/L   Alkaline Phosphatase 270 (H) 38 - 126 U/L   Total Bilirubin 0.6 0.3 - 1.2 mg/dL   GFR calc non Af Amer 17 (L) >60 mL/min   GFR calc Af Amer 19 (L) >60 mL/min   Anion gap 14 5 - 15    Comment: Performed at [facility], [address] [place], [place] [address]  Magnesium     Status: None   Collection Time: 12/26/19  4:13 AM  Result Value Ref Range   Magnesium 2.0 1.7 - 2.4 mg/dL    Comment: Performed at [facility], [address] [place], [place] [address]  Phosphorus     Status: None   Collection Time: 12/26/19  4:13 AM  Result Value Ref Range   Phosphorus 2.8 2.5 - 4.6 mg/dL    Comment: Performed at [facility], [address] [place], [place] [address]  Urinalysis, Complete w Microscopic     Status: Abnormal   Collection Time: 12/26/19  4:14 AM  Result Value Ref Range   Color, Urine YELLOW (A) YELLOW   APPearance CLEAR (A) CLEAR   Specific Gravity, Urine 1.016 1.005 - 1.030   pH 5.0 5.0 - 8.0   Glucose, UA >=500 (A) NEGATIVE mg/dL   Hgb urine dipstick NEGATIVE NEGATIVE   Bilirubin Urine NEGATIVE NEGATIVE   Ketones, ur 5 (A) NEGATIVE mg/dL   Protein, ur NEGATIVE NEGATIVE mg/dL   Nitrite NEGATIVE NEGATIVE   Leukocytes,Ua NEGATIVE NEGATIVE   RBC / HPF 0-5 0 - 5 RBC/hpf   WBC, UA 0-5 0 - 5 WBC/hpf   Bacteria, UA RARE (A) NONE SEEN   Squamous Epithelial / LPF 0-5 0 - 5    Comment: Performed at [facility], [address] [place], [place] [address]  Glucose, capillary  Status: Abnormal   Collection Time: 12/26/19  4:50 AM  Result Value Ref Range   Glucose-Capillary 519 (HH) 70 - 99 mg/dL   Comment 1 Document in Chart    Glucose, capillary     Status: Abnormal   Collection Time: 12/26/19  6:08 AM  Result Value Ref Range   Glucose-Capillary 440 (H) 70 - 99 mg/dL  Glucose, capillary     Status: Abnormal   Collection Time: 12/26/19  8:19 AM  Result Value Ref Range   Glucose-Capillary 291 (H) 70 - 99 mg/dL  Glucose, capillary     Status: Abnormal   Collection Time: 12/26/19  9:42 AM  Result Value Ref Range   Glucose-Capillary 160 (H) 70 - 99 mg/dL  Glucose, capillary     Status: Abnormal   Collection Time: 12/26/19 10:49 AM  Result Value Ref Range   Glucose-Capillary 132 (H) 70 - 99 mg/dL  Basic metabolic panel     Status: Abnormal   Collection Time: 12/26/19 11:04 AM  Result Value Ref Range   Sodium 138 135 - 145 mmol/L   Potassium 4.2 3.5 - 5.1 mmol/L   Chloride 100 98 - 111 mmol/L   CO2 25 22 - 32 mmol/L   Glucose, Bld 167 (H) 70 - 99 mg/dL   BUN 80 (H) 6 - 20 mg/dL   Creatinine, Ser 3.06 (H) 0.44 - 1.00 mg/dL   Calcium 9.2 8.9 - 10.3 mg/dL   GFR calc non Af Amer 16 (L) >60 mL/min   GFR calc Af Amer 18 (L) >60 mL/min   Anion gap 13 5 - 15    Comment: Performed at [facility], [address] [place], [place] [address]  Glucose, capillary     Status: Abnormal   Collection Time: 12/26/19 12:17 PM  Result Value Ref Range   Glucose-Capillary 189 (H) 70 - 99 mg/dL  Glucose, capillary     Status: Abnormal   Collection Time: 12/26/19  1:31 PM  Result Value Ref Range   Glucose-Capillary 170 (H) 70 - 99 mg/dL  Glucose, capillary     Status: Abnormal   Collection Time: 12/26/19  2:31 PM  Result Value Ref Range   Glucose-Capillary 153 (H) 70 - 99 mg/dL  Glucose, capillary     Status: None   Collection Time: 12/26/19  4:57 PM  Result Value Ref Range   Glucose-Capillary 89 70 - 99 mg/dL   CT Head Wo Contrast  Result Date: 12/25/2019 CLINICAL DATA:  Encephalopathy EXAM: CT HEAD WITHOUT CONTRAST TECHNIQUE: Contiguous axial images were obtained from the base of the skull through the  vertex without intravenous contrast. COMPARISON:  None. FINDINGS: Brain: No definite evidence of acute infarction, hemorrhage, hydrocephalus, extra-axial collection or mass lesion/mass effect. There are multiple bilateral cerebral hypodensities, a large, encephalomalacic appearing hypodensity of the frontoparietal right hemisphere (series 2, image 19), of the left frontal pole (series 2, image 70), and of the parietooccipital left hemisphere (series 2, image 16). Vascular: No hyperdense vessel or unexpected calcification. Skull: Normal. Negative for fracture or focal lesion. Sinuses/Orbits: No acute finding. Other: None. IMPRESSION: 1. Multiple bilateral cerebral hypodensities, a large, encephalomalacic appearing hypodensity of the frontoparietal right hemisphere, of the left frontal pole, and of the parietooccipital left hemisphere. 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 2. Consider MR examination of the brain to better evaluate for acute diffusion restricting infarction if suspected. Electronically Signed   By: Eddie Candle M.D.   On: 12/25/2019 17:46   MR  BRAIN WO CONTRAST  Result Date: 12/25/2019 CLINICAL DATA:  Encephalopathy.  Multiple old infarctions by CT. EXAM: MRI HEAD WITHOUT CONTRAST TECHNIQUE: Multiplanar, multiecho pulse sequences of the brain and surrounding structures were obtained without intravenous contrast. COMPARISON:  Head CT same day FINDINGS: Brain: The patient would only allow diffusion imaging. Diffusion imaging does not show any acute or subacute infarction. As shown by CT, there are old small vessel cerebellar infarctions bilaterally, and old left occipital cortical infarction, old left parietal cortical infarction, old left frontal cortical infarction, and old right frontoparietal cortical infarction. Some dystrophic calcification associated with the right frontoparietal infarction. No sign of mass lesion, recent hemorrhage, hydrocephalus or extra-axial  collection. Vascular: Major vessels at the base of the brain show flow. Skull and upper cervical spine: Negative Sinuses/Orbits: Clear/normal Other: None IMPRESSION: Patient allowed only diffusion imaging to be performed. Diffusion imaging does not show any acute or subacute infarction. Extensive old infarctions throughout the brain as outlined above. Electronically Signed   By: Nelson Chimes M.D.   On: 12/25/2019 19:34    Pending Labs Unresulted Labs (From admission, onward)    Start     Ordered   12/26/19 1715  SARS CORONAVIRUS 2 (TAT 6-24 HRS) Nasopharyngeal Nasopharyngeal Swab  Once,   STAT    Question Answer Comment  Is this test for diagnosis or screening Diagnosis of ill patient   Symptomatic for COVID-19 as defined by CDC No   Hospitalized for COVID-19 Unknown   Admitted to ICU for COVID-19 No   Previously tested for COVID-19 Yes   Resident in a congregate (group) care setting No   Employed in healthcare setting No   Pregnant No      12/26/19 1714   12/26/19 0500  CBC  Daily,   STAT     12/25/19 1803   12/26/19 0500  Comprehensive metabolic panel  Daily,   STAT     12/25/19 1803   12/26/19 0500  Magnesium  Daily,   STAT     12/25/19 1803   12/26/19 0500  Phosphorus  Daily,   STAT     12/25/19 1803   12/25/19 1800  Urine Culture  Once,   STAT     12/25/19 1759          Vitals/Pain Today's Vitals   12/26/19 1830 12/26/19 1900 12/26/19 1930 12/26/19 2000  BP: 139/61 (!) 132/59 117/64 126/61  Pulse: 89   (!) 111  Resp: 17 15 17 18   Temp:      TempSrc:      SpO2: 100%   95%  Weight:      Height:      PainSc:        Isolation Precautions No active isolations  Medications Medications  sodium chloride flush (NS) 0.9 % injection 3 mL ( Intravenous Canceled Entry 12/26/19 0849)  traMADol (ULTRAM) tablet 50 mg (has no administration in time range)  senna-docusate (Senokot-S) tablet 1 tablet (has no administration in time range)  ondansetron (ZOFRAN) tablet 4 mg (has  no administration in time range)    Or  ondansetron (ZOFRAN) injection 4 mg (has no administration in time range)  metoprolol tartrate (LOPRESSOR) tablet 25 mg (25 mg Oral Not Given 12/26/19 0850)  atorvastatin (LIPITOR) tablet 80 mg (80 mg Oral Given 12/26/19 1841)  aspirin chewable tablet 81 mg (81 mg Oral Given 12/26/19 0856)  0.9 %  sodium chloride infusion ( Intravenous Rate/Dose Verify 12/26/19 1657)  insulin glargine (LANTUS) injection 10 Units (  10 Units Subcutaneous Given 12/26/19 1223)  insulin aspart (novoLOG) injection 2 Units (0 Units Subcutaneous Hold 12/26/19 1702)  insulin aspart (novoLOG) injection 0-9 Units (0 Units Subcutaneous Hold 12/26/19 1701)  insulin aspart (novoLOG) injection 0-5 Units (has no administration in time range)  enoxaparin (LOVENOX) injection 30 mg (has no administration in time range)  0.9 %  sodium chloride infusion (0 mLs Intravenous Stopped 12/25/19 1831)  ondansetron (ZOFRAN) injection 4 mg (4 mg Intravenous Given 12/25/19 1614)    Mobility walks Low fall risk   Focused Assessments Diabetic Ketoacidosis    R Recommendations: See Admitting Provider Note  Report given to:   Additional Notes:

## 2019-12-26 NOTE — ED Notes (Addendum)
CRITICAL LAB: GLUCOSE is 773, Reginold Agent, [place], NP notified, orders received

## 2019-12-26 NOTE — ED Notes (Signed)
Pt ate an orange and orange juice and couple bites of biscuit. BS checked at this time.

## 2019-12-26 NOTE — ED Notes (Signed)
Called lab and spoke with Susan Navarro and they are receiving the COVID swab now

## 2019-12-26 NOTE — ED Notes (Signed)
Pt assisted up to toilet. Pt ambulatory with 1 assist. Pt's bed linens changed and new chux placed on bed. Pt assisted back into bed. Pt placed back on monitor at this time. Pt given warm blankets. Pt given meal tray.

## 2019-12-26 NOTE — Progress Notes (Signed)
PROGRESS NOTE    Susan Navarro  LKG:[id number] DOB: 1961-03-26 DOA: 12/25/2019 PCP: Rochel Brome, MD       Assessment & Plan:   Active Problems:   DKA (diabetic ketoacidoses) ([place])  DKA: d/c insulin drip. Started on aspart w/ meals, lantus & SSI w/ accuchecks.  Anion gap is 13.0, improving. Continue on IVFs. Carb modified diet. DM coordinator following and recs apprec   DM1: poorly controlled. HbA1c 12.2 in Oct 2020. Continue on sq insulin. Carb modified diet   Hyperkalemia: resolved  Hyponatremia: likely secondary to DKA. Resolved  Acute metabolic encephalopathy: likely secondary to DKA. Improving. CT brain shows remote infarcts. MRI brain shows patient allowed only diffusion imaging to be performed. Diffusion imaging does not show any acute or subacute infarction. 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 Continue on IVFs. Continue to hold home dose of lisinopril . 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 No need for a transfusion at this time. Will continue to monitor  Thrombocytopenia: etiology unclear. Will continue to monitor   HLD: continue on statin   HTN: will continue on home dose of metoprolol, hold for MAP <65 and/or HR <65. Hold home dose of amlodipine and lisinopril    DVT prophylaxis: lovenox Code Status: full  Family Communication:  Disposition Plan:    Consultants:   nephro   Procedures:   Antimicrobials:   Subjective: Pt c/o fatigue  Objective: Vitals:   12/26/19 0733 12/26/19 0810 12/26/19 0830 12/26/19 0850  BP:  (!) 101/50 (!) 99/48 (!) 99/48  Pulse:  86 85 87  Resp:  17 16   Temp:      TempSrc:      SpO2:  100% 100%   Weight: 65.8 kg     Height: 5' 6"  (1.676 m)       Intake/Output Summary (Last 24 hours) at 12/26/2019 1540 Last data filed at 12/26/2019 0851 Gross per 24 hour  Intake 2064.27 ml  Output --  Net 2064.27 ml   Filed Weights    12/25/19 1606 12/26/19 0733  Weight: 65.8 kg 65.8 kg    Examination:  General exam: Appears calm and comfortable  Respiratory system: decreased breath sounds b/l otherwise clear. Respiratory effort normal. Cardiovascular system: S1 & S2 +. No rubs, gallops or clicks. No pedal edema. Gastrointestinal system: Abdomen is nondistended, soft and nontender.. Normal bowel sounds heard. Central nervous system: Alert and oriented. Moves all 4 extremities  Psychiatry: Judgement and insight appear normal. Flat mood and affect     Data Reviewed: I have personally reviewed following labs and imaging studies  CBC: Recent Labs  Lab 12/25/19 1555 12/26/19 0413  WBC 8.8 9.6  NEUTROABS 8.0*  --   HGB 8.8* 8.3*  HCT 28.1* 24.5*  MCV 96.2 89.1  PLT 109* 664*   Basic Metabolic Panel: Recent Labs  Lab 12/25/19 1555 12/25/19 2106 12/26/19 0152 12/26/19 0413 12/26/19 1104  NA 125* 129* 131* 134* 138  K 5.8* 5.6* 4.1 4.1 4.2  CL 86*  --  97* 98 100  CO2 15*  --  22 22 25   GLUCOSE 1,075*  --  773* 644* 167*  BUN 68*  --  79* 78* 80*  CREATININE 2.66*  --  2.96* 2.96* 3.06*  CALCIUM 9.3  --  8.8* 9.0 9.2  MG  --   --   --  2.0  --   PHOS  --   --   --  2.8  --    GFR: Estimated Creatinine Clearance: 18.5 mL/min (A) (by C-G formula based on SCr of 3.06 mg/dL (H)). Liver Function Tests: Recent Labs  Lab 12/25/19 1555 12/26/19 0413  AST 20 23  ALT 42 41  ALKPHOS 313* 270*  BILITOT 1.3* 0.6  PROT 6.6 6.0*  ALBUMIN 3.7 3.4*   No results for input(s): LIPASE, AMYLASE in the last 168 hours. No results for input(s): AMMONIA in the last 168 hours. Coagulation Profile: No results for input(s): INR, PROTIME in the last 168 hours. Cardiac Enzymes: No results for input(s): CKTOTAL, CKMB, CKMBINDEX, TROPONINI in the last 168 hours. BNP (last 3 results) No results for input(s): PROBNP in the last 8760 hours. HbA1C: No results for input(s): HGBA1C in the last 72 hours. CBG: Recent Labs    Lab 12/26/19 0942 12/26/19 1049 12/26/19 1217 12/26/19 1331 12/26/19 1431  GLUCAP 160* 132* 189* 170* 153*   Lipid Profile: No results for input(s): CHOL, HDL, LDLCALC, TRIG, CHOLHDL, LDLDIRECT in the last 72 hours. Thyroid Function Tests: No results for input(s): TSH, T4TOTAL, FREET4, T3FREE, THYROIDAB in the last 72 hours. Anemia Panel: No results for input(s): VITAMINB12, FOLATE, FERRITIN, TIBC, IRON, RETICCTPCT in the last 72 hours. Sepsis Labs: No results for input(s): PROCALCITON, LATICACIDVEN in the last 168 hours.  No results found for this or any previous visit (from the past 240 hour(s)).       Radiology Studies: CT Head Wo Contrast  Result Date: 12/25/2019 CLINICAL DATA:  Encephalopathy EXAM: CT HEAD WITHOUT CONTRAST TECHNIQUE: Contiguous axial images were obtained from the base of the skull through the vertex without intravenous contrast. COMPARISON:  None. FINDINGS: Brain: No definite evidence of acute infarction, hemorrhage, hydrocephalus, extra-axial collection or mass lesion/mass effect. There are multiple bilateral cerebral hypodensities, a large, encephalomalacic appearing hypodensity of the frontoparietal right hemisphere (series 2, image 19), of the left frontal pole (series 2, image 70), and of the parietooccipital left hemisphere (series 2, image 16). Vascular: No hyperdense vessel or unexpected calcification. Skull: Normal. Negative for fracture or focal lesion. Sinuses/Orbits: No acute finding. Other: None. IMPRESSION: 1. Multiple bilateral cerebral hypodensities, a large, encephalomalacic appearing hypodensity of the frontoparietal right hemisphere, of the left frontal pole, and of the parietooccipital left hemisphere. 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 2. Consider MR examination of the brain to better evaluate for acute diffusion restricting infarction if suspected. Electronically Signed   By: Eddie Candle M.D.   On: 12/25/2019 17:46    MR BRAIN WO CONTRAST  Result Date: 12/25/2019 CLINICAL DATA:  Encephalopathy.  Multiple old infarctions by CT. EXAM: MRI HEAD WITHOUT CONTRAST TECHNIQUE: Multiplanar, multiecho pulse sequences of the brain and surrounding structures were obtained without intravenous contrast. COMPARISON:  Head CT same day FINDINGS: Brain: The patient would only allow diffusion imaging. Diffusion imaging does not show any acute or subacute infarction. As shown by CT, there are old small vessel cerebellar infarctions bilaterally, and old left occipital cortical infarction, old left parietal cortical infarction, old left frontal cortical infarction, and old right frontoparietal cortical infarction. Some dystrophic calcification associated with the right frontoparietal infarction. No sign of mass lesion, recent hemorrhage, hydrocephalus or extra-axial collection. Vascular: Major vessels at the base of the brain show flow. Skull and upper cervical spine: Negative Sinuses/Orbits: Clear/normal Other: None IMPRESSION: Patient allowed only diffusion imaging to be performed. Diffusion imaging does not show any acute or subacute infarction. Extensive old infarctions throughout the brain as outlined above. Electronically Signed  By: Nelson Chimes M.D.   On: 12/25/2019 19:34        Scheduled Meds: . aspirin  81 mg Oral Daily  . atorvastatin  80 mg Oral q1800  . insulin aspart  0-5 Units Subcutaneous QHS  . insulin aspart  0-9 Units Subcutaneous TID WC  . insulin aspart  2 Units Subcutaneous TID WC  . insulin glargine  10 Units Subcutaneous Q24H  . metoprolol tartrate  25 mg Oral BID  . sodium chloride flush  3 mL Intravenous Q12H   Continuous Infusions: . sodium chloride 50 mL/hr at 12/26/19 0851     LOS: 1 day    Time spent: 30 mins    Wyvonnia Dusky, MD [facility] Pager [phone]  If 7PM-7AM, please contact night-coverage [email or web address] Password [facility] 12/26/2019, 3:40 PM

## 2019-12-26 NOTE — ED Notes (Signed)
Called pharmacy and they will send up medication

## 2019-12-26 NOTE — ED Notes (Signed)
Called and spoke with Pharmacy regarding pt's lantus insulin and to bring up when ready.

## 2019-12-26 NOTE — ED Notes (Signed)
MD [place] regarding pt's level of care order for room assignment

## 2019-12-26 NOTE — ED Notes (Signed)
COVID swab walked to lab by EDT Mel

## 2019-12-26 NOTE — ED Notes (Signed)
Per Admit MD let pt eat right now and then check BS before hanging any dextrose.

## 2019-12-26 NOTE — ED Notes (Signed)
Pt informed to call husband.

## 2019-12-26 NOTE — ED Notes (Signed)
Pt given meal tray.

## 2019-12-26 NOTE — ED Notes (Addendum)
Insulin held at this time due to BS-89. Admit MD Jimmye Norman informed

## 2019-12-26 NOTE — ED Notes (Signed)
Pt wet bedding changed, pt on bedpan to collected urine, pt reports unable after several minutes on bedpan, pt given warm blankets and repositioned  Mouth care performed

## 2019-12-27 ENCOUNTER — Inpatient Hospital Stay: Payer: Medicare HMO

## 2019-12-27 DIAGNOSIS — D638 Anemia in other chronic diseases classified elsewhere: Secondary | ICD-10-CM | POA: Diagnosis present

## 2019-12-27 DIAGNOSIS — N179 Acute kidney failure, unspecified: Secondary | ICD-10-CM

## 2019-12-27 DIAGNOSIS — R531 Weakness: Secondary | ICD-10-CM

## 2019-12-27 LAB — GLUCOSE, CAPILLARY
Glucose-Capillary: 84 mg/dL (ref 70–99)
Glucose-Capillary: 238 mg/dL — ABNORMAL HIGH (ref 70–99)
Glucose-Capillary: 163 mg/dL — ABNORMAL HIGH (ref 70–99)
Glucose-Capillary: 132 mg/dL — ABNORMAL HIGH (ref 70–99)
Glucose-Capillary: 155 mg/dL — ABNORMAL HIGH (ref 70–99)

## 2019-12-27 LAB — COMPREHENSIVE METABOLIC PANEL
ALT: 60 U/L — ABNORMAL HIGH (ref 0–44)
AST: 61 U/L — ABNORMAL HIGH (ref 15–41)
Albumin: 2.9 g/dL — ABNORMAL LOW (ref 3.5–5.0)
Alkaline Phosphatase: 221 U/L — ABNORMAL HIGH (ref 38–126)
Anion gap: 8 (ref 5–15)
BUN: 78 mg/dL — ABNORMAL HIGH (ref 6–20)
CO2: 25 mmol/L (ref 22–32)
Calcium: 8.6 mg/dL — ABNORMAL LOW (ref 8.9–10.3)
Chloride: 106 mmol/L (ref 98–111)
Creatinine, Ser: 2.63 mg/dL — ABNORMAL HIGH (ref 0.44–1.00)
GFR calc Af Amer: 22 mL/min — ABNORMAL LOW (ref >60)
GFR calc non Af Amer: 19 mL/min — ABNORMAL LOW (ref >60)
Glucose, Bld: 244 mg/dL — ABNORMAL HIGH (ref 70–99)
Potassium: 4.5 mmol/L (ref 3.5–5.1)
Sodium: 139 mmol/L (ref 135–145)
Total Bilirubin: 0.9 mg/dL (ref 0.3–1.2)
Total Protein: 5.3 g/dL — ABNORMAL LOW (ref 6.5–8.1)

## 2019-12-27 LAB — PHOSPHORUS: Phosphorus: 2.5 mg/dL (ref 2.5–4.6)

## 2019-12-27 LAB — CBC
HCT: 24.7 % — ABNORMAL LOW (ref 36.0–46.0)
Hemoglobin: 8.2 g/dL — ABNORMAL LOW (ref 12.0–15.0)
MCH: 30.1 pg (ref 26.0–34.0)
MCHC: 33.2 g/dL (ref 30.0–36.0)
MCV: 90.8 fL (ref 80.0–100.0)
Platelets: 72 10*3/uL — ABNORMAL LOW (ref 150–400)
RBC: 2.72 MIL/uL — ABNORMAL LOW (ref 3.87–5.11)
RDW: 13.2 % (ref 11.5–15.5)
WBC: 5.3 10*3/uL (ref 4.0–10.5)
nRBC: 0 % (ref 0.0–0.2)

## 2019-12-27 LAB — PROTEIN / CREATININE RATIO, URINE
Creatinine, Urine: 102 mg/dL
Protein Creatinine Ratio: 0.25 mg/mg{Cre} — ABNORMAL HIGH (ref 0.00–0.15)
Total Protein, Urine: 25 mg/dL

## 2019-12-27 LAB — SARS CORONAVIRUS 2 (TAT 6-24 HRS): SARS Coronavirus 2: NEGATIVE

## 2019-12-27 LAB — URINE CULTURE

## 2019-12-27 LAB — MAGNESIUM: Magnesium: 1.9 mg/dL (ref 1.7–2.4)

## 2019-12-27 IMAGING — US US RENAL
1 series · 14 of 25 positions shown · non-contrast
Comparison: Renal ultrasound [DATE].

CLINICAL DATA: 58-year-old female with acute renal failure.

EXAM:
RENAL / URINARY TRACT ULTRASOUND COMPLETE

[Series 1: us renal · 14 of 38 slices shown]
[im 1/38]
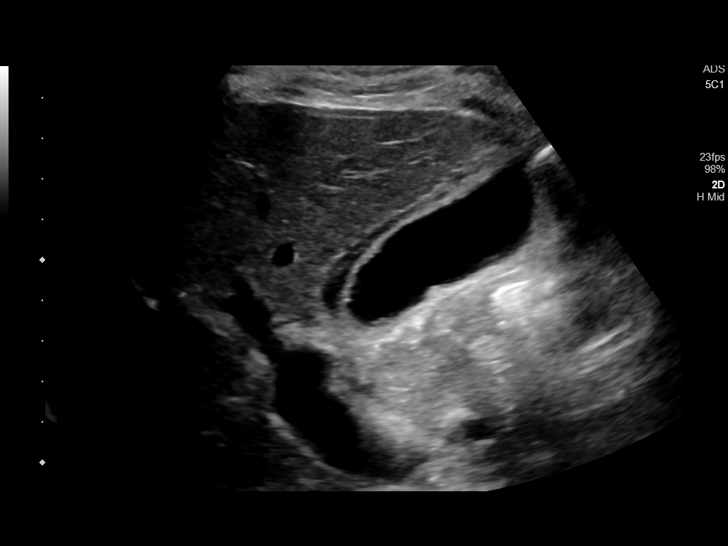
[im 4/38]
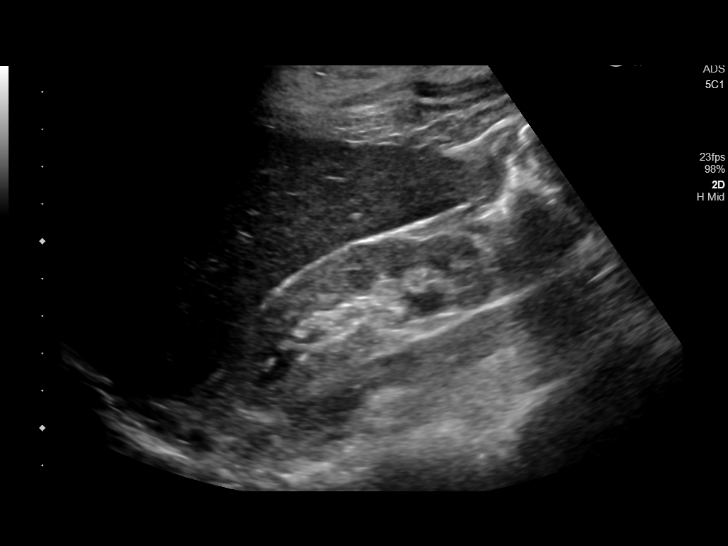
[im 7/38]
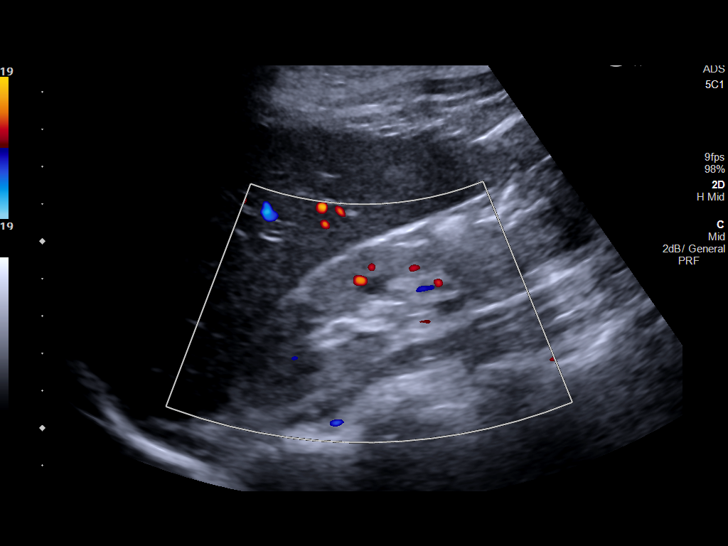
[im 10/38]
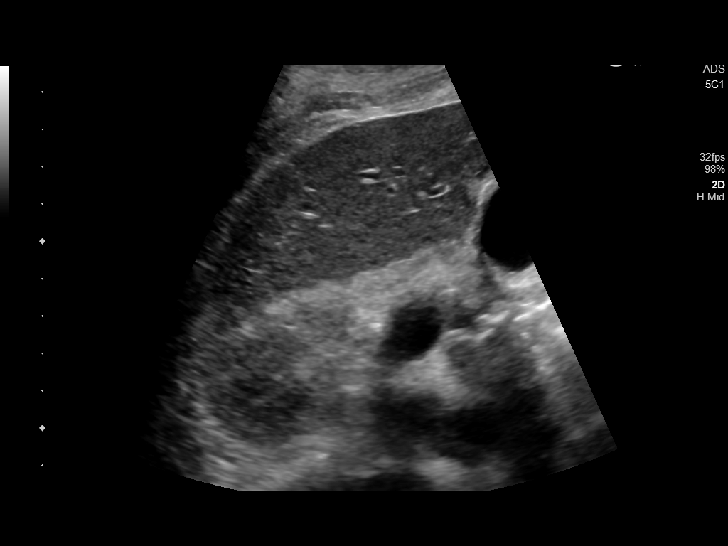
[im 13/38]
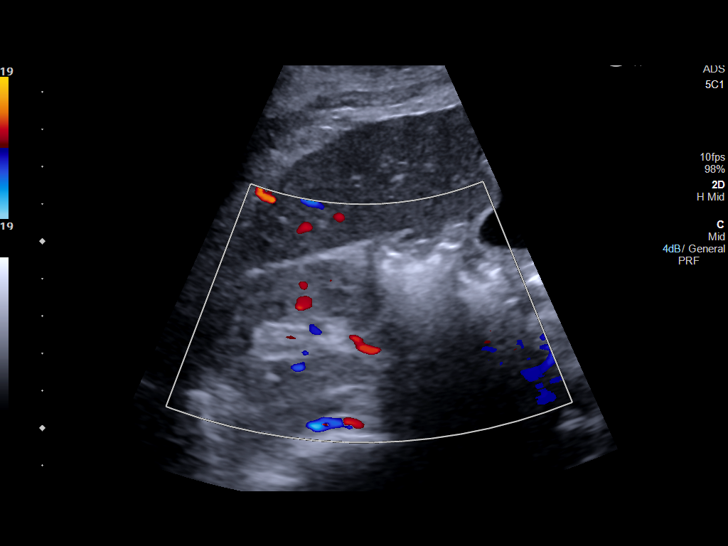
[im 14/38]
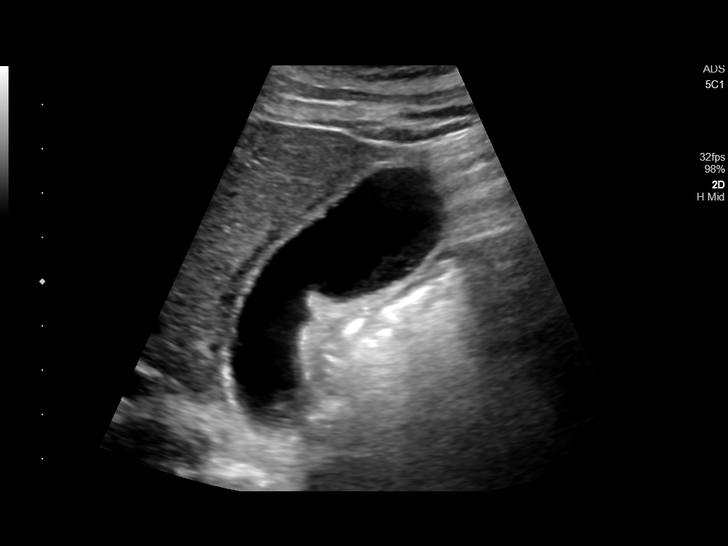
[im 17/38]
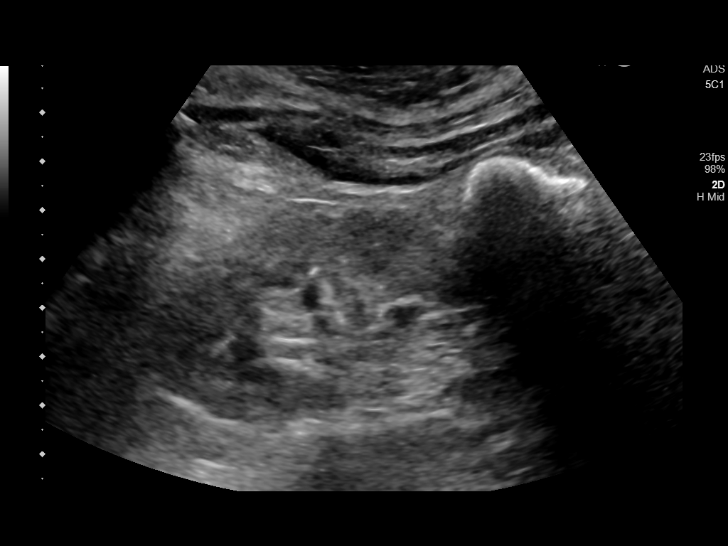
[im 21/38]
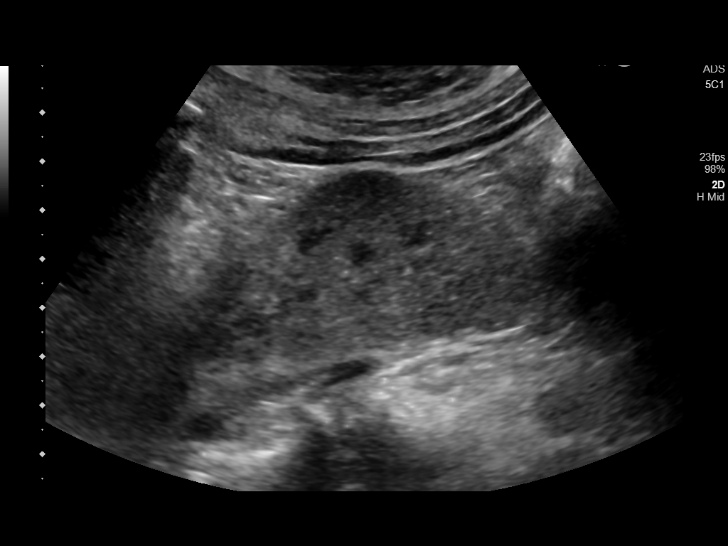
[im 24/38]
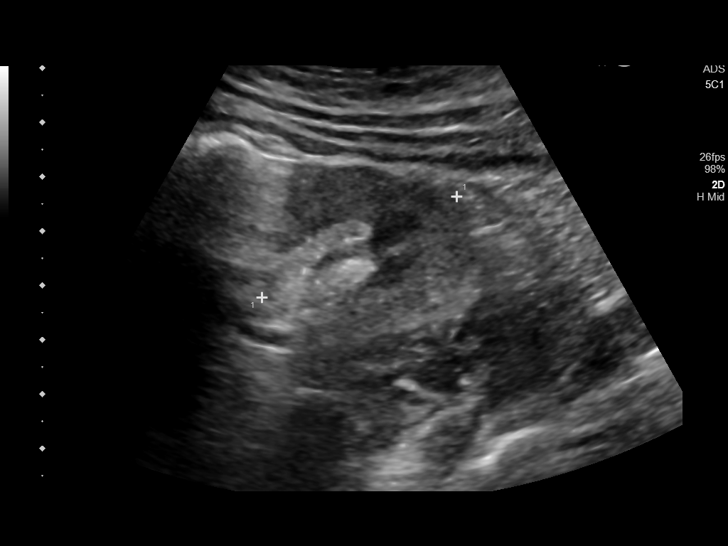
[im 25/38]
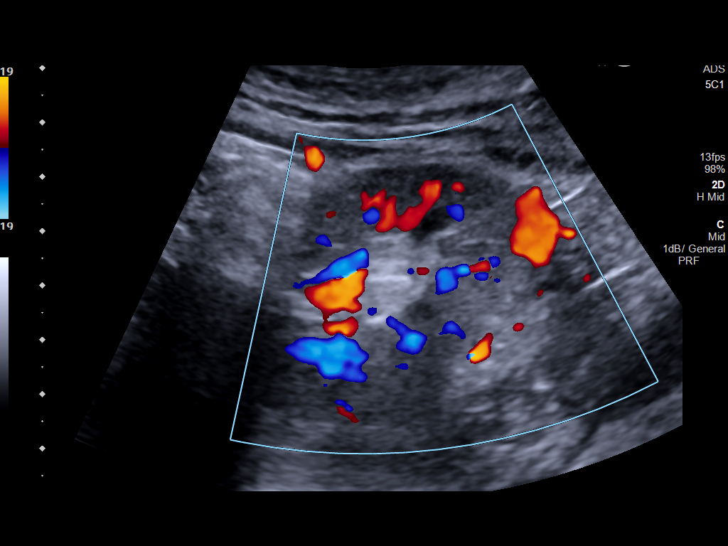
[im 28/38]
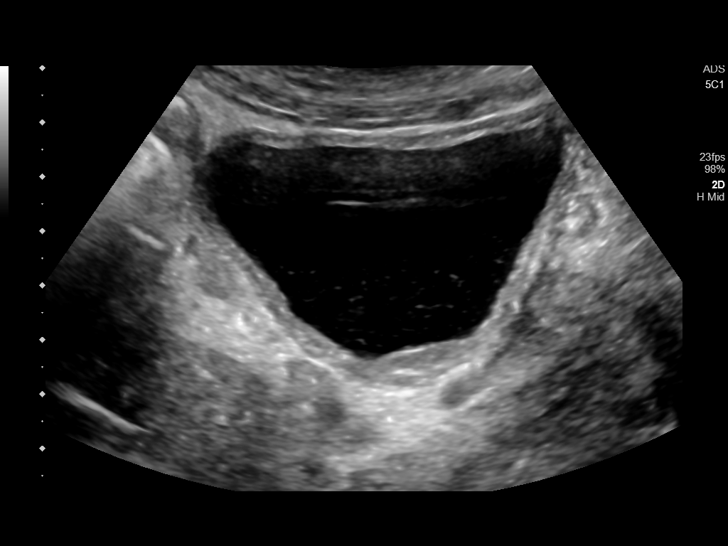
[im 31/38]
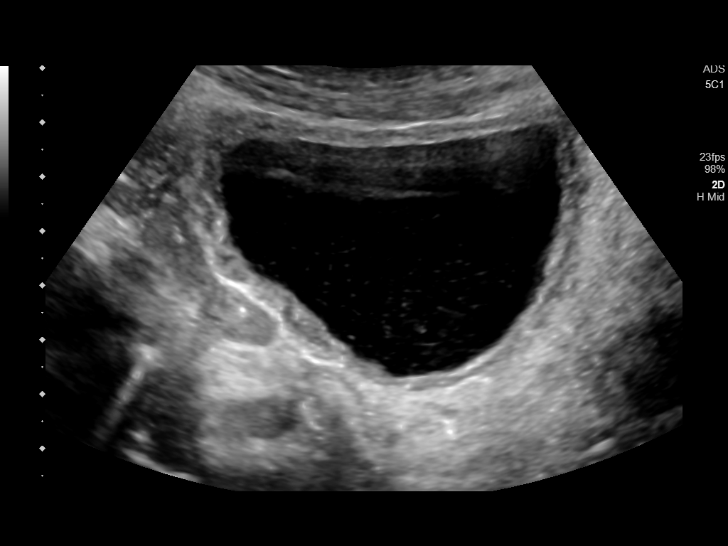
[im 34/38]
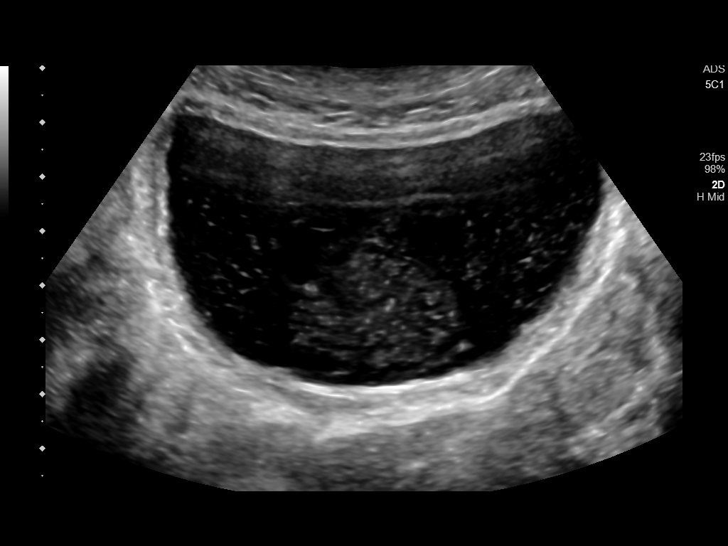
[im 38/38]
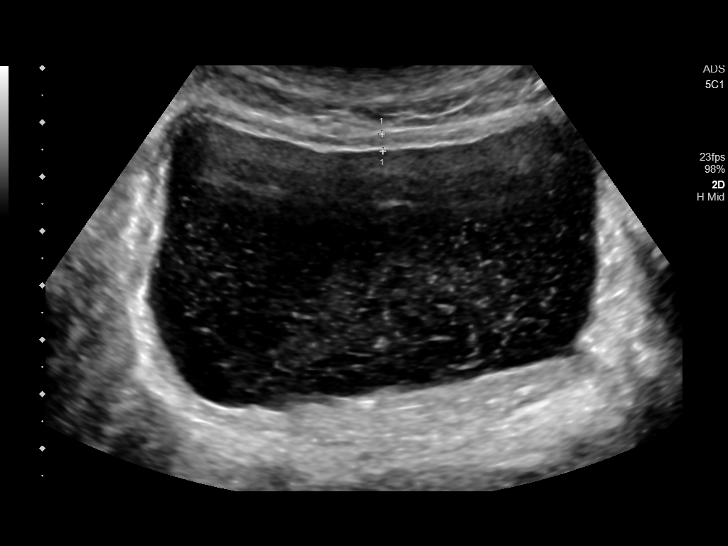

[14 of 25 positions shown; findings below may reference images not displayed]

FINDINGS: Right Kidney:

Renal measurements: 7.8 x 2.9 x 4.4 centimeters = volume: 53 mL
(previously 84 mL). Echogenic right kidney (image 7) with no
hydronephrosis or solid renal mass.

Left Kidney:

Renal measurements: 8.3 x 4.3 x 4.0 centimeters = volume: 76 mL
(previously 69 mL). Left renal cortex echogenicity appears more
normal. No left hydronephrosis or solid renal mass.

Bladder:

Abundant echogenic urinary debris within the bladder (image 28).
Confluent debris depicted on image 34. Mild circumferential bladder
wall thickening.

Other:

Abnormal appearance of the gallbladder with suggestion of
pericholecystic fluid (image 15) and layering sludge. Gallbladder
wall thickness seems to remain normal.
IMPRESSION: 1. Abundant debris within the urinary bladder. Recommend correlation
with urinalysis.
2. No acute renal findings.
3. Abnormal gallbladder with layering sludge and pericholecystic
fluid.
The appearance is nonspecific. If there is right upper quadrant
abdominal pain recommend dedicated gallbladder ultrasound.

## 2019-12-27 MED ORDER — ENSURE MAX PROTEIN PO LIQD
11.0000 [oz_av] | Freq: Two times a day (BID) | ORAL | Status: DC
  Administered 2019-12-27 – 2019-12-30 (×4): 11 [oz_av] via ORAL
  Filled 2019-12-27: qty 330

## 2019-12-27 NOTE — Evaluation (Signed)
Physical Therapy Evaluation Patient Details Name: Susan Navarro MRN: [id number] DOB: [birth year] Today's Date: 12/27/2019   History of Present Illness  Pt is 59 y/o F with PMH DM1-poorly controlled, CKD IV, Thrombocytopenia, HLD, and HTN. Pt adm for DKA and acute metabolic encephalopathy.  Clinical Impression  Patient received in recliner, agrees to PT assessment. Reports she is feeling better. Performed sit to stand transfer with mod independence, ambulated 150 feet with RW and min guard. No lob but patient veers left and right with ambulation. She will continue to benefit from skilled PT while here to improve safety with mobility and improve strength.     Follow Up Recommendations No PT follow up    Equipment Recommendations  Rolling walker with 5" wheels    Recommendations for Other Services       Precautions / Restrictions Precautions Precautions: Fall Restrictions Weight Bearing Restrictions: No      Mobility  Bed Mobility Overal bed mobility: Modified Independent             General bed mobility comments: Patient up in recliner  Transfers Overall transfer level: Modified independent Equipment used: Rolling walker (2 wheeled) Transfers: Sit to/from Stand Sit to Stand: Modified independent (Device/Increase time) Stand pivot transfers: Min assist          Ambulation/Gait Ambulation/Gait assistance: Min guard Gait Distance (Feet): 150 Feet Assistive device: Rolling walker (2 wheeled) Gait Pattern/deviations: Step-through pattern;Drifts right/left Gait velocity: WNL   General Gait Details: no LOB using RW, min guard for safety, drifts to right and left during ambulation  Stairs            Wheelchair Mobility    Modified Rankin (Stroke Patients Only)       Balance Overall balance assessment: Mild deficits observed, not formally tested                                           Pertinent Vitals/Pain Pain Assessment: No/denies  pain    Home Living Family/patient expects to be discharged to:: Private residence Living Arrangements: Alone Available Help at Discharge: Friend(s);Available PRN/intermittently Type of Home: Apartment Home Access: Stairs to enter Entrance Stairs-Rails: None Entrance Stairs-Number of Steps: 1 curb Home Layout: One level Home Equipment: Walker - 2 wheels;Cane - single point Additional Comments: states she does not use AD. Does state that she is wobbly and that bathroom in her apt is far from bedroom-trouble getting to it at night time.    Prior Function Level of Independence: Independent         Comments: Per OT note: reports no falls in last 6 months to a year. States she has a motorized scooter that she can "drive" to a store that is near her apartment. Has [employer] for transportation to [place] appointments. States her boyfriend helps with transport at times as well.     Hand Dominance   Dominant Hand: Right    Extremity/Trunk Assessment   Upper Extremity Assessment Upper Extremity Assessment: Defer to OT evaluation    Lower Extremity Assessment Lower Extremity Assessment: Generalized weakness    Cervical / Trunk Assessment Cervical / Trunk Assessment: Normal  Communication   Communication: No difficulties  Cognition Arousal/Alertness: Awake/alert Behavior During Therapy: WFL for tasks assessed/performed Overall Cognitive Status: No family/caregiver present to determine baseline cognitive functioning Area of Impairment: Orientation;Memory;Following commands;Safety/judgement;Problem solving  Orientation Level: Disoriented to;Time   Memory: Decreased short-term memory Following Commands: Follows one step commands with increased time Safety/Judgement: Decreased awareness of safety;Decreased awareness of deficits   Problem Solving: Slow processing General Comments: Pt mostly appropriate with MD note stating that acute metabolic  encephalopathy appears to be resolving. But pt with some notable increased processing time.      General Comments      Exercises Other Exercises Other Exercises: OT facilitates safety education with pt including fall prevention, safe use of RW for fxl mobility and transfers (hand placement), and use of call bell while in acute setting d/t attached to IV. Pt verbalized understanding, but some decreased problem solving-requires reinforcement. Other Exercises: OT facilitates education re: general role of OT as well as role of OT in acute setting. Pt verbalized understanding, needs reinforcement.   Assessment/Plan    PT Assessment Patient needs continued PT services  PT Problem List Decreased strength;Decreased mobility;Decreased activity tolerance;Decreased safety awareness       PT Treatment Interventions Gait training;Therapeutic exercise;Functional mobility training;Therapeutic activities;Patient/family education    PT Goals (Current goals can be found in the Care Plan section)  Acute Rehab PT Goals Patient Stated Goal: to get to feeling better and get home PT Goal Formulation: With patient Time For Goal Achievement: 01/03/20 Potential to Achieve Goals: Good    Frequency Min 2X/week   Barriers to discharge Decreased caregiver support      Co-evaluation               AM-PAC PT "6 Clicks" Mobility  Outcome Measure Help needed turning from your back to your side while in a flat bed without using bedrails?: None Help needed moving from lying on your back to sitting on the side of a flat bed without using bedrails?: None Help needed moving to and from a bed to a chair (including a wheelchair)?: A Little Help needed standing up from a chair using your arms (e.g., wheelchair or bedside chair)?: None Help needed to walk in hospital room?: A Little Help needed climbing 3-5 steps with a railing? : A Little 6 Click Score: 21    End of Session Equipment Utilized During  Treatment: Gait belt Activity Tolerance: Patient tolerated treatment well Patient left: in chair;with call bell/phone within reach Nurse Communication: Mobility status PT Visit Diagnosis: Muscle weakness (generalized) (M62.81)    Time: 1030-1050 PT Time Calculation (min) (ACUTE ONLY): 20 min   Charges:   PT Evaluation $PT Eval Moderate Complexity: 1 Mod PT Treatments $Gait Training: 8-22 mins        Yotam Rhine, PT, GCS 12/27/19,11:00 AM

## 2019-12-27 NOTE — Evaluation (Signed)
Occupational Therapy Evaluation Patient Details Name: Susan Navarro MRN: [id number] DOB: Apr 22, 1961 Today's Date: 12/27/2019    History of Present Illness Pt is 59 y/o F with PMH DM1-poorly controlled, CKD IV, Thrombocytopenia, HLD, and HTN. Pt adm for DKA and acute metabolic encephalopathy.   Clinical Impression   Pt seen for OT evaluation this date. Prior to hospital admission, pt was Indep with self care ADLs and household IADLs. Pt was using motorized scooter to get from apt to store and for further transport-uses "[facility]" transport or her boyfriend drives her.  Pt lives in level apartment with 1 STE from the curb.  Currently pt demonstrates impairments in standing balance and tolerance requiring MIN A for ADL transfers, MIN/MOD A for dynamic standing balance ADLs such as clothing mgt over hips and peri care.  Pt would benefit from skilled OT to address noted impairments and functional limitations (see below for any additional details) in order to maximize safety and independence while minimizing falls risk and caregiver burden.  Upon hospital discharge, recommend pt discharge to home with [facility] to assess for safety modifications that can be made to home environment for fall prevention. In addition, recommend intermittent supv for when pt is performing standing ADLs/IADLs at least initially following d/c from acute setting.    Follow Up Recommendations  Home health OT;Supervision - Intermittent(supv for OOB/standing ADLs/fxl mobility at least initially on d/c home)    Equipment Recommendations  3 in 1 bedside commode;Tub/shower seat(2WW)    Recommendations for Other Services       Precautions / Restrictions Precautions Precautions: Fall Restrictions Weight Bearing Restrictions: No      Mobility Bed Mobility Overal bed mobility: Modified Independent                Transfers Overall transfer level: Needs assistance Equipment used: Rolling walker (2  wheeled) Transfers: Sit to/from [facility] Sit to Stand: Min assist Stand pivot transfers: Min assist            Balance Overall balance assessment: Mild deficits observed, not formally tested                                         ADL either performed or assessed with clinical judgement   ADL Overall ADL's : Needs assistance/impaired Eating/Feeding: Independent;Sitting   Grooming: Wash/dry hands;Wash/dry face;Oral care;Supervision/safety;Standing Grooming Details (indicate cue type and reason): with RW Upper Body Bathing: Set up;Sitting   Lower Body Bathing: Minimal assistance;Moderate assistance;Sit to/from stand   Upper Body Dressing : Set up;Sitting Upper Body Dressing Details (indicate cue type and reason): setup d/t IV line on R UE Lower Body Dressing: Minimal assistance;Sit to/from stand Lower Body Dressing Details (indicate cue type and reason): pt able to don socks in sitting with setup, requires MIN A for simulating standing clothing mgt over hips. Toilet Transfer: Minimal Systems analyst Details (indicate cue type and reason): verbal cues for safe use of RW, pt keeps attempting to pick up versus rolling. Toileting- Clothing Manipulation and Hygiene: Moderate assistance;Sit to/from stand Toileting - Clothing Manipulation Details (indicate cue type and reason): Pt requires UE support on RW in standing for peri care, able to alternate hands to moderately paricipate     Functional mobility during ADLs: Min guard;Rolling walker       Vision Baseline Vision/History: Wears glasses Wears Glasses: At all times Patient Visual Report: No change from  baseline Additional Comments: pt with some blurred vision, decreased peripheral vision. Pt reports it has been that way for a while, but cannot pinpoint start.     Perception     Praxis      Pertinent Vitals/Pain Pain Assessment: No/denies pain     Hand  Dominance Right   Extremity/Trunk Assessment Upper Extremity Assessment Upper Extremity Assessment: Generalized weakness(L slightly weaker than R, pt reports hx stroke.)   Lower Extremity Assessment Lower Extremity Assessment: Defer to PT evaluation;Generalized weakness       Communication Communication Communication: No difficulties   Cognition Arousal/Alertness: Awake/alert Behavior During Therapy: WFL for tasks assessed/performed Overall Cognitive Status: Impaired/Different from baseline Area of Impairment: Orientation;Memory;Following commands;Safety/judgement;Problem solving                 Orientation Level: Disoriented to;Time(pt initially states Dec 2020, but with context cues, is able to discern that it is Jan 2021)   Memory: Decreased short-term memory Following Commands: Follows one step commands with increased time Safety/Judgement: Decreased awareness of safety;Decreased awareness of deficits   Problem Solving: Slow processing General Comments: Pt mostly appropriate with MD note stating that acute metabolic encephalopathy appears to be resolving. But pt with some notable increased processing time.   General Comments       Exercises Other Exercises Other Exercises: OT facilitates safety education with pt including fall prevention, safe use of RW for fxl mobility and transfers (hand placement), and use of call bell while in acute setting d/t attached to IV. Pt verbalized understanding, but some decreased problem solving-requires reinforcement. Other Exercises: OT facilitates education re: general role of OT as well as role of OT in acute setting. Pt verbalized understanding, needs reinforcement.   Shoulder Instructions      Home Living Family/patient expects to be discharged to:: Private residence Living Arrangements: Alone Available Help at Discharge: Friend(s);Available PRN/intermittently;Family(dtr lives in [place], came home temporarily and can help pt for  limited time. Pt states her boyfriend can help some as well.) Type of Home: Apartment Home Access: Stairs to enter Entrance Stairs-Number of Steps: 1 curb Entrance Stairs-Rails: None Home Layout: One level               Home Equipment: [place] - 2 wheels;Cane - single point(states her walker is old and she needs a new one.)   Additional Comments: states she does not use AD. Does state that she is wobbly and that bathroom in her apt is far from bedroom-trouble getting to it at night time.      Prior Functioning/Environment Level of Independence: Independent        Comments: reports no falls in last 6 months to a year. States she has a motorized scooter that she can "drive" to a store that is near her apartment. Has [facility] for transportation to [place] appointments. States her boyfriend helps with transport at times as well.        OT Problem List: Decreased strength;Decreased activity tolerance;Impaired balance (sitting and/or standing);Decreased safety awareness;Decreased knowledge of use of DME or AE      OT Treatment/Interventions: Self-care/ADL training;Therapeutic exercise;DME and/or AE instruction;Therapeutic activities;Balance training    OT Goals(Current goals can be found in the care plan section) Acute Rehab OT Goals Patient Stated Goal: to get to feeling better and get home OT Goal Formulation: With patient Time For Goal Achievement: 01/10/20 Potential to Achieve Goals: Good  OT Frequency: Min 1X/week   Barriers to D/C:  Co-evaluation              AM-PAC OT "6 Clicks" Daily Activity     Outcome Measure Help from another person eating meals?: None Help from another person taking care of personal grooming?: A Little Help from another person toileting, which includes using toliet, bedpan, or urinal?: A Little Help from another person bathing (including washing, rinsing, drying)?: A Little Help from another person to put on and  taking off regular upper body clothing?: None Help from another person to put on and taking off regular lower body clothing?: A Little 6 Click Score: 20   End of Session Equipment Utilized During Treatment: Gait belt;Rolling walker Nurse Communication: Mobility status  Activity Tolerance: Patient tolerated treatment well Patient left: in chair;with call bell/phone within reach;with nursing/sitter in room(no chair alarms on unit, OT reported this to Network engineer, Nurse, learning disability. CNA present in room with pt at end of OT session and pt notified to use call bell at this time.)  OT Visit Diagnosis: Unsteadiness on feet (R26.81);Muscle weakness (generalized) (M62.81)                Time: 6389-3734 OT Time Calculation (min): 49 min Charges:  OT General Charges $OT Visit: 1 Visit OT Evaluation $OT Eval Moderate Complexity: 1 Mod OT Treatments $Self Care/Home Management : 8-22 mins $Therapeutic Activity: 8-22 mins  Gerrianne Scale, MS, OTR/L ascom [phone] 12/27/19, 10:17 AM

## 2019-12-27 NOTE — Progress Notes (Signed)
Inpatient Diabetes Program Recommendations  AACE/ADA: New Consensus Statement on Inpatient Glycemic Control (2015)  Target Ranges:  Prepandial:   less than 140 mg/dL      Peak postprandial:   less than 180 mg/dL (1-2 hours)      Critically ill patients:  140 - 180 mg/dL   Lab Results  Component Value Date   GLUCAP 163 (H) 12/27/2019   HGBA1C 12.2 (H) 10/08/2019      Diabetes history:  DM1 Outpatient Diabetes medications: Lantus 10 units daily; Humalog 10 units TID with meals Current orders for Inpatient glycemic control: Lantus 10 units daily; Novolog 0-9 units TID with meals + 0-5 QHS; Novolog 2 units TID with meals  Inpatient Diabetes Program Recommendations:     Note:  Spoke with patient at bedside.   Patient admitted with DKA.  Per patient, she did not take her insulin X 1 day because she was not feeling well. Reviewed patient's current A1c of 12.2% as of 10/08/19. Explained what a A1c is and what it measures. Also reviewed goal A1c with patient, importance of good glucose control @ home, and blood sugar goals.  Denies difficulties obtaining medicaytions.  She states she is current with her PCP-Can't remember her name because she is new to the practice.  Checks her CBG at least one time a day.  Denies drinking drinks with sugar.  Reviewed foods with carbohydrates and importance of limiting them.  Reviewed hypoglycemia and treatment.    Thank you, Geoffry Paradise, RN, BSN Diabetes Coordinator Inpatient Diabetes Program [phone] (team pager from 8a-5p)

## 2019-12-27 NOTE — Progress Notes (Addendum)
PROGRESS NOTE    Susan Navarro  EZM:[id number] DOB: 07/30/1961 DOA: 12/25/2019 PCP: Rochel Brome, MD       Assessment & Plan:   Active Problems:   DKA (diabetic ketoacidoses) ([place])   Hyperosmolar hyperglycemic state (HHS) ([place])  DKA: Continue on aspart w/ meals, lantus & SSI w/ accuchecks.  Anion gap is 8, normal now. Continue on IVFs. Carb modified diet. DM coordinator following and recs apprec   DM1: poorly controlled. HbA1c 12.2 in Oct 2020. Continue on sq insulin. Carb modified diet   Hyperkalemia: resolved  Hyponatremia: likely secondary to DKA. Resolved  Acute metabolic encephalopathy: likely secondary to DKA. Resolved, likely back to baseline. CT brain shows remote infarcts. MRI brain shows patient allowed only diffusion imaging to be performed. Diffusion imaging does not show any acute or subacute infarction. 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 Currently stage IV. Cr is trending down today. Continue on IVFs. Continue to hold home dose of lisinopril . 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 No need for a transfusion at this time. Will continue to monitor  Thrombocytopenia: etiology unclear. Will continue to monitor   HLD: continue on statin   HTN: will continue on home dose of metoprolol, hold for MAP <65 and/or HR <65. Hold home dose of amlodipine and lisinopril  Transaminitis: etiology unclear. Will continue to monitor   Elevated alkaline phosphatase: etiology unclear, trending down. Will continue to monitor   Generalized weakness: OT is recommending home health OT & walker; Pt is refusing home health    DVT prophylaxis: lovenox Code Status: full  Family Communication:  Disposition Plan: needs IV hydration x 1 day more, likely d/c tomorrow after nephro sees the pt;   Consultants:   nephro   Procedures:   Antimicrobials: n/a   Subjective: Pt c/o  malaise  Objective: Vitals:   12/26/19 2000 12/26/19 2228 12/27/19 0437 12/27/19 0739  BP: 126/61 127/69 (!) 105/59 111/68  Pulse: (!) 111 88 80 83  Resp: 18 16 16 16   Temp:  98.4 F (36.9 C) 98.7 F (37.1 C) 98 F (36.7 C)  TempSrc:  Oral Oral Oral  SpO2: 95% 100% 99% 97%  Weight:      Height:        Intake/Output Summary (Last 24 hours) at 12/27/2019 0803 Last data filed at 12/26/2019 2315 Gross per 24 hour  Intake 1119.15 ml  Output --  Net 1119.15 ml   Filed Weights   12/25/19 1606 12/26/19 0733  Weight: 65.8 kg 65.8 kg    Examination:  General exam: Appears calm and comfortable  Respiratory system: diminished breath sounds b/l otherwise clear. Respiratory effort normal. Cardiovascular system: S1 & S2 +. No rubs, gallops or clicks. No pedal edema. Gastrointestinal system: Abdomen is nondistended, soft and nontender.. Normal bowel sounds heard. Central nervous system: Alert and oriented. Moves all 4 extremities  Psychiatry: Judgement and insight appear normal. Flat mood and affect     Data Reviewed: I have personally reviewed following labs and imaging studies  CBC: Recent Labs  Lab 12/25/19 1555 12/26/19 0413 12/27/19 0609  WBC 8.8 9.6 5.3  NEUTROABS 8.0*  --   --   HGB 8.8* 8.3* 8.2*  HCT 28.1* 24.5* 24.7*  MCV 96.2 89.1 90.8  PLT 109* 100* 72*   Basic Metabolic Panel: Recent Labs  Lab 12/25/19 1555 12/25/19 2106 12/26/19 0152 12/26/19 0413 12/26/19 1104 12/27/19 0609  NA 125*  129* 131* 134* 138 139  K 5.8* 5.6* 4.1 4.1 4.2 4.5  CL 86*  --  97* 98 100 106  CO2 15*  --  22 22 25 25   GLUCOSE 1,075*  --  773* 644* 167* 244*  BUN 68*  --  79* 78* 80* 78*  CREATININE 2.66*  --  2.96* 2.96* 3.06* 2.63*  CALCIUM 9.3  --  8.8* 9.0 9.2 8.6*  MG  --   --   --  2.0  --  1.9  PHOS  --   --   --  2.8  --  2.5   GFR: Estimated Creatinine Clearance: 21.6 mL/min (A) (by C-G formula based on SCr of 2.63 mg/dL (H)). Liver Function Tests: Recent Labs  Lab  12/25/19 1555 12/26/19 0413 12/27/19 0609  AST 20 23 61*  ALT 42 41 60*  ALKPHOS 313* 270* 221*  BILITOT 1.3* 0.6 0.9  PROT 6.6 6.0* 5.3*  ALBUMIN 3.7 3.4* 2.9*   No results for input(s): LIPASE, AMYLASE in the last 168 hours. No results for input(s): AMMONIA in the last 168 hours. Coagulation Profile: No results for input(s): INR, PROTIME in the last 168 hours. Cardiac Enzymes: No results for input(s): CKTOTAL, CKMB, CKMBINDEX, TROPONINI in the last 168 hours. BNP (last 3 results) No results for input(s): PROBNP in the last 8760 hours. HbA1C: No results for input(s): HGBA1C in the last 72 hours. CBG: Recent Labs  Lab 12/26/19 1431 12/26/19 1657 12/26/19 2137 12/26/19 2306 12/27/19 0741  GLUCAP 153* 89 103* 84 238*   Lipid Profile: No results for input(s): CHOL, HDL, LDLCALC, TRIG, CHOLHDL, LDLDIRECT in the last 72 hours. Thyroid Function Tests: No results for input(s): TSH, T4TOTAL, FREET4, T3FREE, THYROIDAB in the last 72 hours. Anemia Panel: No results for input(s): VITAMINB12, FOLATE, FERRITIN, TIBC, IRON, RETICCTPCT in the last 72 hours. Sepsis Labs: No results for input(s): PROCALCITON, LATICACIDVEN in the last 168 hours.  Recent Results (from the past 240 hour(s))  SARS CORONAVIRUS 2 (TAT 6-24 HRS) Nasopharyngeal Nasopharyngeal Swab     Status: None   Collection Time: 12/26/19  5:15 PM   Specimen: Nasopharyngeal Swab  Result Value Ref Range Status   SARS Coronavirus 2 NEGATIVE NEGATIVE Final    Comment: (NOTE) SARS-CoV-2 target nucleic acids are NOT DETECTED. The SARS-CoV-2 RNA is generally detectable in upper and lower respiratory specimens during the acute phase of infection. Negative results do not preclude SARS-CoV-2 infection, do not rule out co-infections with other pathogens, and should not be used as the sole basis for treatment or other patient management decisions. Negative results must be combined with clinical observations, patient history, and  epidemiological information. The expected result is Negative. Fact Sheet for Patients: SugarRoll.be Fact Sheet for Healthcare Providers: https://www.woods-mathews.com/ This test is not yet approved or cleared by the [place] FDA and  has been authorized for detection and/or diagnosis of SARS-CoV-2 by FDA under an Emergency Use Authorization (EUA). This EUA will remain  in effect (meaning this test can be used) for the duration of the COVID-19 declaration under Section 56 4(b)(1) of the Act, 21 U.S.C. section 360bbb-3(b)(1), unless the authorization is terminated or revoked sooner. Performed at [facility], [place] [address] [place], [place] [address]          Radiology Studies: CT Head Wo Contrast  Result Date: 12/25/2019 CLINICAL DATA:  Encephalopathy EXAM: CT HEAD WITHOUT CONTRAST TECHNIQUE: Contiguous axial images were obtained from the base of the skull through the  vertex without intravenous contrast. COMPARISON:  None. FINDINGS: Brain: No definite evidence of acute infarction, hemorrhage, hydrocephalus, extra-axial collection or mass lesion/mass effect. There are multiple bilateral cerebral hypodensities, a large, encephalomalacic appearing hypodensity of the frontoparietal right hemisphere (series 2, image 19), of the left frontal pole (series 2, image 70), and of the parietooccipital left hemisphere (series 2, image 16). Vascular: No hyperdense vessel or unexpected calcification. Skull: Normal. Negative for fracture or focal lesion. Sinuses/Orbits: No acute finding. Other: None. IMPRESSION: 1. Multiple bilateral cerebral hypodensities, a large, encephalomalacic appearing hypodensity of the frontoparietal right hemisphere, of the left frontal pole, and of the parietooccipital left hemisphere. 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 2. Consider MR examination of the brain to better evaluate for acute diffusion  restricting infarction if suspected. Electronically Signed   By: Eddie Candle M.D.   On: 12/25/2019 17:46   MR BRAIN WO CONTRAST  Result Date: 12/25/2019 CLINICAL DATA:  Encephalopathy.  Multiple old infarctions by CT. EXAM: MRI HEAD WITHOUT CONTRAST TECHNIQUE: Multiplanar, multiecho pulse sequences of the brain and surrounding structures were obtained without intravenous contrast. COMPARISON:  Head CT same day FINDINGS: Brain: The patient would only allow diffusion imaging. Diffusion imaging does not show any acute or subacute infarction. As shown by CT, there are old small vessel cerebellar infarctions bilaterally, and old left occipital cortical infarction, old left parietal cortical infarction, old left frontal cortical infarction, and old right frontoparietal cortical infarction. Some dystrophic calcification associated with the right frontoparietal infarction. No sign of mass lesion, recent hemorrhage, hydrocephalus or extra-axial collection. Vascular: Major vessels at the base of the brain show flow. Skull and upper cervical spine: Negative Sinuses/Orbits: Clear/normal Other: None IMPRESSION: Patient allowed only diffusion imaging to be performed. Diffusion imaging does not show any acute or subacute infarction. Extensive old infarctions throughout the brain as outlined above. Electronically Signed   By: Nelson Chimes M.D.   On: 12/25/2019 19:34        Scheduled Meds: . aspirin  81 mg Oral Daily  . atorvastatin  80 mg Oral q1800  . enoxaparin (LOVENOX) injection  30 mg Subcutaneous Q24H  . insulin aspart  0-5 Units Subcutaneous QHS  . insulin aspart  0-9 Units Subcutaneous TID WC  . insulin aspart  2 Units Subcutaneous TID WC  . insulin glargine  10 Units Subcutaneous Q24H  . metoprolol tartrate  25 mg Oral BID  . sodium chloride flush  3 mL Intravenous Q12H   Continuous Infusions: . sodium chloride 50 mL/hr at 12/27/19 0616     LOS: 2 days    Time spent: 32 mins    Wyvonnia Dusky, MD [facility] Pager [phone]  If 7PM-7AM, please contact night-coverage [email or web address] Password [email or web address] 12/27/2019, 8:03 AM

## 2019-12-27 NOTE — Consult Note (Signed)
[facility] CONSULT NOTE    Date: 12/27/2019                  Patient Name:  Susan Navarro  MRN: [id number]  DOB: 08/05/1961  Age / Sex: 59 y.o., female         PCP: Rochel Brome, MD                 Service Requesting Consult:  Hospitalist                 Reason for Consult:  Acute kidney injury/chronic kidney disease stage IIIb            History of Present Illness: Patient is a 59 y.o. 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  Patient reports that she has not taken her insulin over the past week.  She had significant hyperglycemia upon admission.  She has known underlying chronic kidney disease with a baseline creatinine of 9 2 with an EGFR of 32.  Her overall glycemic control is poor as most recent hemoglobin A1c was 12.2.  Yesterday BUN was 8 with a creatinine of 3.06.  Today BUN is down to 78 with a creatinine of 2.63.  Overall her condition has improved as compared to admission.   Medications: Outpatient medications: Medications Prior to Admission  Medication Sig Dispense Refill Last Dose  . amLODipine (NORVASC) 5 MG tablet Take 5 mg by mouth daily.   24+ hours at Unknown  . aspirin EC 81 MG tablet Take 81 mg by mouth daily.   24+ hours at Unknown  . atorvastatin (LIPITOR) 80 MG tablet Take 80 mg by mouth every evening.   24+ hours at Unknown  . Insulin Glargine (LANTUS SOLOSTAR) 100 UNIT/ML Solostar Pen Inject 10 Units into the skin daily.    24+ hours at Unknown  . insulin lispro (HUMALOG KWIKPEN) 100 UNIT/ML KwikPen Inject 0.1 mLs (10 Units total) into the skin 3 (three) times daily before meals. 15 mL 11 24+ hours at Unknown  . lisinopril (ZESTRIL) 2.5 MG tablet Take 2.5 mg by mouth every evening.    24+ hours at Unknown  . metoprolol tartrate (LOPRESSOR) 25 MG tablet Take 25 mg  by mouth 2 (two) times daily.    24+ hours at Unknown    Current medications: Current Facility-Administered Medications  Medication Dose Route Frequency Provider Last Rate Last Admin  . 0.9 %  sodium chloride infusion   Intravenous Continuous Wyvonnia Dusky, MD 50 mL/hr at 12/27/19 0616 New Bag at 12/27/19 5681  . aspirin chewable tablet 81 mg  81 mg Oral Daily Wyvonnia Dusky, MD   81 mg at 12/27/19 2751  . atorvastatin (LIPITOR) tablet 80 mg  80 mg Oral q1800 Wyvonnia Dusky, MD   80 mg at 12/26/19 1841  . enoxaparin (LOVENOX) injection 30 mg  30 mg Subcutaneous Q24H Wyvonnia Dusky, MD   30 mg at 12/26/19 2314  . insulin aspart (novoLOG) injection 0-5 Units  0-5 Units Subcutaneous QHS Wyvonnia Dusky, MD      . insulin aspart (novoLOG) injection 0-9 Units  0-9 Units Subcutaneous TID WC Wyvonnia Dusky, MD   3 Units at 12/27/19 [phone]  . insulin aspart (novoLOG) injection 2 Units  2 Units Subcutaneous TID WC Wyvonnia Dusky, MD  2 Units at 12/27/19 0277  . insulin glargine (LANTUS) injection 10 Units  10 Units Subcutaneous Q24H Wyvonnia Dusky, MD   10 Units at 12/27/19 [phone]  . metoprolol tartrate (LOPRESSOR) tablet 25 mg  25 mg Oral BID Wyvonnia Dusky, MD   25 mg at 12/27/19 7867  . ondansetron (ZOFRAN) tablet 4 mg  4 mg Oral Q6H PRN Wyvonnia Dusky, MD       Or  . ondansetron [facility]) injection 4 mg  4 mg Intravenous Q6H PRN Wyvonnia Dusky, MD      . senna-docusate (Senokot-S) tablet 1 tablet  1 tablet Oral QHS PRN Wyvonnia Dusky, MD      . sodium chloride flush (NS) 0.9 % injection 3 mL  3 mL Intravenous Q12H Wyvonnia Dusky, MD   3 mL at 12/26/19 2315  . traMADol (ULTRAM) tablet 50 mg  50 mg Oral Q6H PRN Wyvonnia Dusky, MD          Allergies: No Known Allergies    Past Medical History: Past Medical History:  Diagnosis Date  . Anemia   . Chronic kidney disease   . Diabetes mellitus without complication ([place])   .  Hypertension   . Stroke [facility]) 03/2018   Ischemic stroke per [facility] records     Past Surgical History: History reviewed. No pertinent surgical history.   Family History: Family History  Problem Relation Age of Onset  . Diabetes Mother   . Diabetes Brother      Social History: Social History   Socioeconomic History  . Marital status: Single    Spouse name: Not on file  . Number of children: Not on file  . Years of education: Not on file  . Highest education level: Not on file  Occupational History  . Not on file  Tobacco Use  . Smoking status: Never Smoker  . Smokeless tobacco: Never Used  Substance and Sexual Activity  . Alcohol use: No  . Drug use: Not on file  . Sexual activity: Not on file  Other Topics Concern  . Not on file  Social History Narrative  . Not on file   Social Determinants of Health   Financial Resource Strain:   . Difficulty of Paying Living Expenses: Not on file  Food Insecurity:   . Worried About Charity fundraiser in the Last Year: Not on file  . Ran Out of Food in the Last Year: Not on file  Transportation Needs:   . Lack of Transportation (Medical): Not on file  . Lack of Transportation (Non-Medical): Not on file  Physical Activity:   . Days of Exercise per Week: Not on file  . Minutes of Exercise per Session: Not on file  Stress:   . Feeling of Stress : Not on file  Social Connections:   . Frequency of Communication with Friends and Family: Not on file  . Frequency of Social Gatherings with Friends and Family: Not on file  . Attends Religious Services: Not on file  . Active Member of Clubs or Organizations: Not on file  . Attends Archivist Meetings: Not on file  . Marital Status: Not on file  Intimate Partner Violence:   . Fear of Current or Ex-Partner: Not on file  . Emotionally Abused: Not on file  . Physically Abused: Not on file  . Sexually Abused: Not on file     Review of Systems: Review of Systems   Constitutional: Positive for  malaise/fatigue. Negative for chills and fever.  HENT: Negative for congestion, hearing loss and tinnitus.   Eyes: Negative for blurred vision and double vision.  Respiratory: Negative for cough and hemoptysis.   Cardiovascular: Negative for chest pain, palpitations and leg swelling.  Gastrointestinal: Positive for nausea and vomiting. Negative for heartburn.  Genitourinary: Negative for dysuria, frequency and urgency.  Musculoskeletal: Negative for joint pain and myalgias.  Skin: Negative for itching and rash.  Neurological: Positive for weakness. Negative for dizziness.  Endo/Heme/Allergies: Does not bruise/bleed easily.  Psychiatric/Behavioral: Negative for depression. The patient is not nervous/anxious.      Vital Signs: Blood pressure 111/68, pulse 83, temperature 98 F (36.7 C), temperature source Oral, resp. rate 16, height 5' 6"  (1.676 m), weight 65.8 kg, SpO2 97 %.  Weight trends: Filed Weights   12/25/19 1606 12/26/19 0733  Weight: 65.8 kg 65.8 kg    Physical Exam: General: NAD, resting in bed  Head: Normocephalic, atraumatic.  Eyes: Anicteric, EOMI  Nose: Mucous membranes moist, not inflammed, nonerythematous.  Throat: Oropharynx nonerythematous, no exudate appreciated.   Neck: Supple, trachea midline.  Lungs:  Normal respiratory effort. Clear to auscultation BL without crackles or wheezes.  Heart: RRR. S1 and S2 normal without gallop, murmur, or rubs.  Abdomen:  BS normoactive. Soft, Nondistended, non-tender.  No masses or organomegaly.  Extremities: No pretibial edema.  Neurologic: A&O X3, Motor strength is 5/5 in the all 4 extremities  Skin: No visible rashes, scars.    Lab results: Basic Metabolic Panel: Recent Labs  Lab 12/26/19 0413 12/26/19 1104 12/27/19 0609  NA 134* 138 139  K 4.1 4.2 4.5  CL 98 100 106  CO2 22 25 25   GLUCOSE 644* 167* 244*  BUN 78* 80* 78*  CREATININE 2.96* 3.06* 2.63*  CALCIUM 9.0 9.2 8.6*  MG  2.0  --  1.9  PHOS 2.8  --  2.5    Liver Function Tests: Recent Labs  Lab 12/25/19 1555 12/26/19 0413 12/27/19 0609  AST 20 23 61*  ALT 42 41 60*  ALKPHOS 313* 270* 221*  BILITOT 1.3* 0.6 0.9  PROT 6.6 6.0* 5.3*  ALBUMIN 3.7 3.4* 2.9*   No results for input(s): LIPASE, AMYLASE in the last 168 hours. No results for input(s): AMMONIA in the last 168 hours.  CBC: Recent Labs  Lab 12/25/19 1555 12/26/19 0413 12/27/19 0609  WBC 8.8 9.6 5.3  NEUTROABS 8.0*  --   --   HGB 8.8* 8.3* 8.2*  HCT 28.1* 24.5* 24.7*  MCV 96.2 89.1 90.8  PLT 109* 100* 72*    Cardiac Enzymes: No results for input(s): CKTOTAL, CKMB, CKMBINDEX, TROPONINI in the last 168 hours.  BNP: Invalid input(s): POCBNP  CBG: Recent Labs  Lab 12/26/19 1657 12/26/19 2137 12/26/19 2306 12/27/19 0741 12/27/19 1150  GLUCAP 89 103* 84 238* 163*    Microbiology: Results for orders placed or performed during the hospital encounter of 12/25/19  Urine Culture     Status: Abnormal   Collection Time: 12/26/19  4:13 AM   Specimen: Urine, Random  Result Value Ref Range Status   Specimen Description   Final    URINE, RANDOM Performed at [facility], [address], [place], [place] [address]    Special Requests   Final    NONE Performed at [facility], [address] [place], [place] [address]    Culture MULTIPLE SPECIES PRESENT, SUGGEST RECOLLECTION (A)  Final   Report Status 12/27/2019 FINAL  Final  SARS CORONAVIRUS 2 (TAT 6-24 HRS) Nasopharyngeal Nasopharyngeal Swab     Status: None   Collection Time: 12/26/19  5:15 PM   Specimen: Nasopharyngeal Swab  Result Value Ref Range Status   SARS Coronavirus 2 NEGATIVE NEGATIVE Final    Comment: (NOTE) SARS-CoV-2 target nucleic acids are NOT DETECTED. The SARS-CoV-2 RNA is generally detectable in upper and lower respiratory specimens during the acute phase of infection. Negative results do not preclude SARS-CoV-2 infection, do not rule  out co-infections with other pathogens, and should not be used as the sole basis for treatment or other patient management decisions. Negative results must be combined with clinical observations, patient history, and epidemiological information. The expected result is Negative. Fact Sheet for Patients: SugarRoll.be Fact Sheet for Healthcare Providers: https://www.woods-mathews.com/ This test is not yet approved or cleared by the [place] FDA and  has been authorized for detection and/or diagnosis of SARS-CoV-2 by FDA under an Emergency Use Authorization (EUA). This EUA will remain  in effect (meaning this test can be used) for the duration of the COVID-19 declaration under Section 56 4(b)(1) of the Act, 21 U.S.C. section 360bbb-3(b)(1), unless the authorization is terminated or revoked sooner. Performed at [facility], [place] [address], [place], [place] [address]     Coagulation Studies: No results for input(s): LABPROT, INR in the last 72 hours.  Urinalysis: Recent Labs    12/26/19 0414  COLORURINE YELLOW*  LABSPEC 1.016  PHURINE 5.0  GLUCOSEU >=500*  HGBUR NEGATIVE  BILIRUBINUR NEGATIVE  KETONESUR 5*  PROTEINUR NEGATIVE  NITRITE NEGATIVE  LEUKOCYTESUR NEGATIVE      Imaging: CT Head Wo Contrast  Result Date: 12/25/2019 CLINICAL DATA:  Encephalopathy EXAM: CT HEAD WITHOUT CONTRAST TECHNIQUE: Contiguous axial images were obtained from the base of the skull through the vertex without intravenous contrast. COMPARISON:  None. FINDINGS: Brain: No definite evidence of acute infarction, hemorrhage, hydrocephalus, extra-axial collection or mass lesion/mass effect. There are multiple bilateral cerebral hypodensities, a large, encephalomalacic appearing hypodensity of the frontoparietal right hemisphere (series 2, image 19), of the left frontal pole (series 2, image 70), and of the parietooccipital left hemisphere (series 2, image  16). Vascular: No hyperdense vessel or unexpected calcification. Skull: Normal. Negative for fracture or focal lesion. Sinuses/Orbits: No acute finding. Other: None. IMPRESSION: 1. Multiple bilateral cerebral hypodensities, a large, encephalomalacic appearing hypodensity of the frontoparietal right hemisphere, of the left frontal pole, and of the parietooccipital left hemisphere. 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 2. Consider MR examination of the brain to better evaluate for acute diffusion restricting infarction if suspected. Electronically Signed   By: Eddie Candle M.D.   On: 12/25/2019 17:46   MR BRAIN WO CONTRAST  Result Date: 12/25/2019 CLINICAL DATA:  Encephalopathy.  Multiple old infarctions by CT. EXAM: MRI HEAD WITHOUT CONTRAST TECHNIQUE: Multiplanar, multiecho pulse sequences of the brain and surrounding structures were obtained without intravenous contrast. COMPARISON:  Head CT same day FINDINGS: Brain: The patient would only allow diffusion imaging. Diffusion imaging does not show any acute or subacute infarction. As shown by CT, there are old small vessel cerebellar infarctions bilaterally, and old left occipital cortical infarction, old left parietal cortical infarction, old left frontal cortical infarction, and old right frontoparietal cortical infarction. Some dystrophic calcification associated with the right frontoparietal infarction. No sign of mass lesion, recent hemorrhage, hydrocephalus or extra-axial collection. Vascular: Major vessels at the base of the brain show flow. Skull and upper cervical spine: Negative Sinuses/Orbits: Clear/normal Other:  None IMPRESSION: Patient allowed only diffusion imaging to be performed. Diffusion imaging does not show any acute or subacute infarction. Extensive old infarctions throughout the brain as outlined above. Electronically Signed   By: Nelson Chimes M.D.   On: 12/25/2019 19:34      Assessment & Plan: Pt is a 59 y.o.  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   1.  Acute kidney injury/chronic kidney disease stage IIIb baseline EGFR 32/diabetes mellitus type 1 with chronic kidney disease.  The patient's acute kidney injury is likely related to nausea, vomiting, and recent severe hyperglycemia.  Check renal ultrasound to make sure there is no underlying obstruction.  Maintain the patient on 0.0 normal saline as renal function appears to be improving.  We counseled the patient that she will need long-term follow-up for her underlying chronic kidney disease.  Her glycemic control has been suboptimal as most recent hemoglobin A1c was 12.2.  She will need outpatient nephrology follow-up with [place] as well.  2.  Anemia of chronic kidney disease.  Hemoglobin currently 8.2.  Check SPEP and UPEP.  We will need to consider Epogen as an outpatient as well.  3.  Thanks for consultation.

## 2019-12-27 NOTE — TOC Initial Note (Signed)
Transition of Care [facility]) - Initial/Assessment Note    Patient Details  Name: Susan Navarro MRN: [id number] Date of Birth: [birth year]  Transition of [facility]) CM/SW Contact:    Shelbie Hutching, RN Phone Number: 12/27/2019, 12:11 PM  Clinical Narrative:                 Patient admitted for DKA.  Patient is from home and lives alone, but she said her daughter came home yesterday and is probably staying for a while.  Patient is independent in ADL's. Patient does not drive she reports she takes the bus to appointments.  Patient reports that she is followed at [facility] internal medicine for her diabetes.  Patient refuses home health services at this time.  Patient does need a rolling walker and 3 in 1 delivered to the room before discharge.  [employer] will provide equipment.    Expected Discharge Plan: Home/Self Care Barriers to Discharge: Continued Medical Work up   Patient Goals and CMS Choice Patient states their goals for this hospitalization and ongoing recovery are:: to get home      Expected Discharge Plan and Services Expected Discharge Plan: Home/Self Care   Discharge Planning Services: CM Consult   Living arrangements for the past 2 months: Apartment                 DME Arranged: 3-N-1, Walker rolling DME Agency: [employer] Date DME Agency Contacted: 12/27/19 Time DME Agency Contacted: 1209 Representative spoke with at DME Agency: [place] Arranged: Refused HH          Prior Living Arrangements/Services Living arrangements for the past 2 months: Apartment Lives with:: Self Patient language and need for interpreter reviewed:: Yes Do you feel safe going back to the place where you live?: Yes      Need for Family Participation in Patient Care: Yes (Comment)(diabetes) Care giver support system in place?: Yes (comment)(daughter)   Criminal Activity/Legal Involvement Pertinent to Current Situation/Hospitalization: No - Comment as needed  Activities of Daily Living Home  Assistive Devices/Equipment: Eyeglasses, CBG Meter ADL Screening (condition at time of admission) Patient's cognitive ability adequate to safely complete daily activities?: Yes Is the patient deaf or have difficulty hearing?: Yes Does the patient have difficulty seeing, even when wearing glasses/contacts?: No Does the patient have difficulty concentrating, remembering, or making decisions?: No Patient able to express need for assistance with ADLs?: Yes Does the patient have difficulty dressing or bathing?: No Independently performs ADLs?: Yes (appropriate for developmental age) Does the patient have difficulty walking or climbing stairs?: No Weakness of Legs: Both Weakness of Arms/Hands: None  Permission Sought/Granted Permission sought to share information with : Case Manager Permission granted to share information with : Yes, Verbal Permission Granted              Emotional Assessment Appearance:: Appears stated age Attitude/Demeanor/Rapport: Engaged Affect (typically observed): Accepting Orientation: : Oriented to Self, Oriented to Place, Oriented to  Time, Oriented to Situation Alcohol / Substance Use: Not Applicable Psych Involvement: No (comment)  Admission diagnosis:  DKA (diabetic ketoacidoses) ([place]) [E11.10] Altered mental status, unspecified altered mental status type [R41.82] Diabetic ketoacidosis without coma associated with type 1 diabetes mellitus ([place]) [E10.10] Hyperosmolar hyperglycemic state (HHS) ([place]) [E11.00, E11.65] Patient Active Problem List   Diagnosis Date Noted  . Hyperosmolar hyperglycemic state (HHS) ([place]) 12/26/2019  . Altered mental status   . DKA (diabetic ketoacidoses) ([place]) 12/25/2019  . Hyponatremia   . Hyperkalemia    PCP:  Rochel Brome, MD Pharmacy:   [facility], [place] [address] [place] [address] [place] [address] [place] [address] Phone: [phone] Fax:  [phone]     Social Determinants of Health (SDOH) Interventions    Readmission Risk Interventions No flowsheet data found.

## 2019-12-27 NOTE — Progress Notes (Signed)
Initial Nutrition Assessment  DOCUMENTATION CODES:   Non-severe (moderate) malnutrition in context of chronic illness  INTERVENTION:  Provide Ensure Max Protein po BID, each supplement provides 150 kcal and 30 grams of protein.  NUTRITION DIAGNOSIS:   Moderate Malnutrition related to chronic illness(poorly controlled DM type 1, CKD, inadequate oral intake) as evidenced by moderate fat depletion, severe fat depletion, moderate muscle depletion.  GOAL:   Patient will meet greater than or equal to 90% of their needs  MONITOR:   PO intake, Supplement acceptance, Labs, Weight trends, I & O's  REASON FOR ASSESSMENT:   Malnutrition Screening Tool    ASSESSMENT:   59 year old female with PMHx of DM type 1, HTN, hx CVA, CKD admitted with DKA, acute metabolic encephalopathy.   Met with patient at bedside. She reports she has had a decreased appetite and intake for about one week PTA. She was attempting to eat meals but could not eat much due to not feeling well. She is unable to describe exact details of her intake PTA. After further investigation she reports she has actually not been eating well for a while and has been losing weight. She reports her appetite is starting to slowly improve now. She is eating about 50% of her meals here. Discussed importance of adequate intake at meals to prevent unintentional weight loss/loss of lean body mass. Patient is amenable to drinking oral nutrition supplement to help meet calorie/protein needs and prefers vanilla flavor. RD offered patient education on nutrition for diabetes but patient refuses at this time and states she understands carbohydrate-modified diet and does not have any education needs.  Patient reports her UBW was 167 lbs. Do not see a weight that high in the chart. Patient was 64.9 kg on 10/08/2019. RD obtained bed scale weight of 61.8 kg (136.24 lbs). She has lost 3.1 kg (4.8% body weight) over the past 3 months, which is not significant  for time frame.  Medications reviewed and include: Novolog 0-9 units TID, Novolog 0-5 units QHS, Novolog 2 units TID with meals, Lantus 10 units daily, NS at 50 mL/hr.  Labs reviewed: CBG 84-238, BUN 78, Creatinine 2.63.  NUTRITION - FOCUSED PHYSICAL EXAM:    Most Recent Value  Orbital Region  Severe depletion  Upper Arm Region  Moderate depletion  Thoracic and Lumbar Region  Moderate depletion  Buccal Region  Severe depletion  Temple Region  Severe depletion  Clavicle Bone Region  Moderate depletion  Clavicle and Acromion Bone Region  Moderate depletion  Scapular Bone Region  Unable to assess  Dorsal Hand  Moderate depletion  Patellar Region  Mild depletion  Anterior Thigh Region  Mild depletion  Posterior Calf Region  Moderate depletion  Edema (RD Assessment)  Mild  Hair  Reviewed  Eyes  Reviewed  Mouth  Reviewed  Skin  Reviewed  Nails  Reviewed     Diet Order:   Diet Order            Diet Carb Modified Fluid consistency: Thin; Room service appropriate? Yes  Diet effective now             EDUCATION NEEDS:   Not appropriate for education at this time  Skin:  Skin Assessment: Reviewed RN Assessment  Last BM:  12/26/2019  Height:   Ht Readings from Last 1 Encounters:  12/26/19 5' 6"  (1.676 m)   Weight:   Wt Readings from Last 1 Encounters:  12/27/19 61.8 kg   Ideal Body Weight:  59.1  kg  BMI:  Body mass index is 21.99 kg/m.  Estimated Nutritional Needs:   Kcal:  1500-1700  Protein:  75-85 grams  Fluid:  1.5-1.7 L/day  Jacklynn Barnacle, MS, RD, LDN Office: [phone] Pager: [phone] After Hours/Weekend Pager: [phone]

## 2019-12-28 ENCOUNTER — Inpatient Hospital Stay: Payer: Medicare HMO

## 2019-12-28 DIAGNOSIS — R7401 Elevation of levels of liver transaminase levels: Secondary | ICD-10-CM

## 2019-12-28 DIAGNOSIS — D696 Thrombocytopenia, unspecified: Secondary | ICD-10-CM

## 2019-12-28 DIAGNOSIS — K7469 Other cirrhosis of liver: Secondary | ICD-10-CM

## 2019-12-28 DIAGNOSIS — E440 Moderate protein-calorie malnutrition: Secondary | ICD-10-CM | POA: Diagnosis present

## 2019-12-28 LAB — CBC
HCT: 26.7 % — ABNORMAL LOW (ref 36.0–46.0)
Hemoglobin: 8.8 g/dL — ABNORMAL LOW (ref 12.0–15.0)
MCH: 30.4 pg (ref 26.0–34.0)
MCHC: 33 g/dL (ref 30.0–36.0)
MCV: 92.4 fL (ref 80.0–100.0)
Platelets: 53 10*3/uL — ABNORMAL LOW (ref 150–400)
RBC: 2.89 MIL/uL — ABNORMAL LOW (ref 3.87–5.11)
RDW: 13.2 % (ref 11.5–15.5)
WBC: 2 10*3/uL — ABNORMAL LOW (ref 4.0–10.5)
nRBC: 0 % (ref 0.0–0.2)

## 2019-12-28 LAB — COMPREHENSIVE METABOLIC PANEL
ALT: 280 U/L — ABNORMAL HIGH (ref 0–44)
AST: 346 U/L — ABNORMAL HIGH (ref 15–41)
Albumin: 2.9 g/dL — ABNORMAL LOW (ref 3.5–5.0)
Alkaline Phosphatase: 264 U/L — ABNORMAL HIGH (ref 38–126)
Anion gap: 6 (ref 5–15)
BUN: 69 mg/dL — ABNORMAL HIGH (ref 6–20)
CO2: 26 mmol/L (ref 22–32)
Calcium: 8.6 mg/dL — ABNORMAL LOW (ref 8.9–10.3)
Chloride: 106 mmol/L (ref 98–111)
Creatinine, Ser: 2.1 mg/dL — ABNORMAL HIGH (ref 0.44–1.00)
GFR calc Af Amer: 29 mL/min — ABNORMAL LOW (ref >60)
GFR calc non Af Amer: 25 mL/min — ABNORMAL LOW (ref >60)
Glucose, Bld: 150 mg/dL — ABNORMAL HIGH (ref 70–99)
Potassium: 4.2 mmol/L (ref 3.5–5.1)
Sodium: 138 mmol/L (ref 135–145)
Total Bilirubin: 1 mg/dL (ref 0.3–1.2)
Total Protein: 5.5 g/dL — ABNORMAL LOW (ref 6.5–8.1)

## 2019-12-28 LAB — GLUCOSE, CAPILLARY
Glucose-Capillary: 192 mg/dL — ABNORMAL HIGH (ref 70–99)
Glucose-Capillary: 207 mg/dL — ABNORMAL HIGH (ref 70–99)
Glucose-Capillary: 318 mg/dL — ABNORMAL HIGH (ref 70–99)
Glucose-Capillary: 391 mg/dL — ABNORMAL HIGH (ref 70–99)
Glucose-Capillary: 277 mg/dL — ABNORMAL HIGH (ref 70–99)

## 2019-12-28 LAB — PROTEIN ELECTROPHORESIS, SERUM
A/G Ratio: 1.3 (ref 0.7–1.7)
Albumin ELP: 3.4 g/dL (ref 2.9–4.4)
Alpha-1-Globulin: 0.2 g/dL (ref 0.0–0.4)
Alpha-2-Globulin: 0.6 g/dL (ref 0.4–1.0)
Beta Globulin: 1 g/dL (ref 0.7–1.3)
Gamma Globulin: 0.8 g/dL (ref 0.4–1.8)
Globulin, Total: 2.6 g/dL (ref 2.2–3.9)
Total Protein ELP: 6 g/dL (ref 6.0–8.5)

## 2019-12-28 LAB — HEPATITIS PANEL, ACUTE
HCV Ab: NONREACTIVE
Hep A IgM: NONREACTIVE
Hep B C IgM: NONREACTIVE
Hepatitis B Surface Ag: NONREACTIVE

## 2019-12-28 LAB — C3 COMPLEMENT: C3 Complement: 97 mg/dL (ref 82–167)

## 2019-12-28 LAB — C4 COMPLEMENT: Complement C4, Body Fluid: 19 mg/dL (ref 12–38)

## 2019-12-28 LAB — PROTIME-INR
INR: 1 (ref 0.8–1.2)
Prothrombin Time: 12.8 seconds (ref 11.4–15.2)

## 2019-12-28 LAB — ANA W/REFLEX IF POSITIVE: Anti Nuclear Antibody (ANA): NEGATIVE

## 2019-12-28 LAB — PHOSPHORUS: Phosphorus: 2.8 mg/dL (ref 2.5–4.6)

## 2019-12-28 LAB — MAGNESIUM: Magnesium: 1.8 mg/dL (ref 1.7–2.4)

## 2019-12-28 IMAGING — US US ABDOMEN LIMITED
1 series · 13 of 25 positions shown · non-contrast
Comparison: Renal ultrasound [DATE]

CLINICAL DATA: Abnormal appearance of the gallbladder on previous
study. Transaminitis.

EXAM:
ULTRASOUND ABDOMEN LIMITED RIGHT UPPER QUADRANT

[Series 1: us abdomen limited · 13 of 79 slices shown]
[im 1/79]
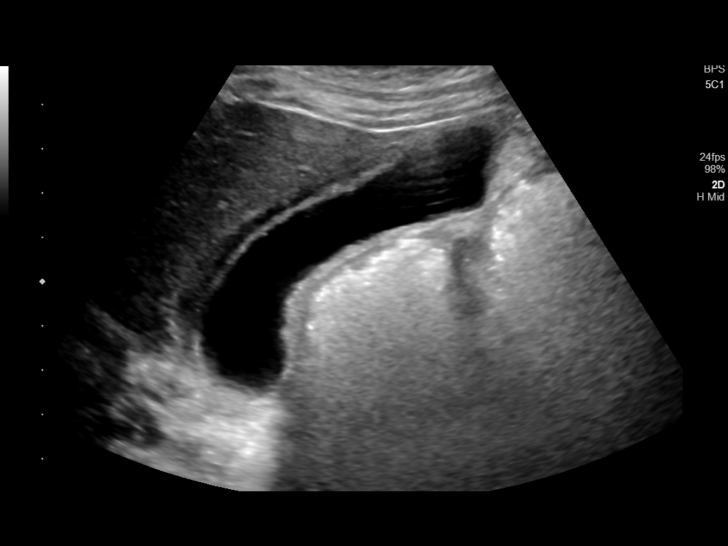
[im 7/79]
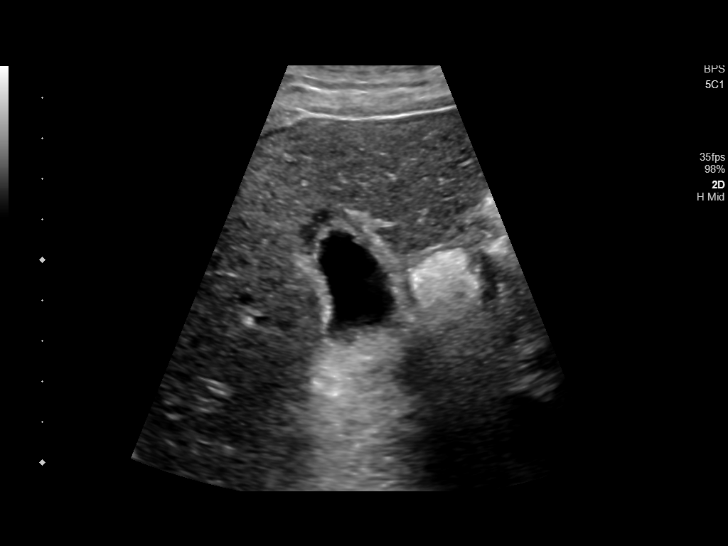
[im 14/79]
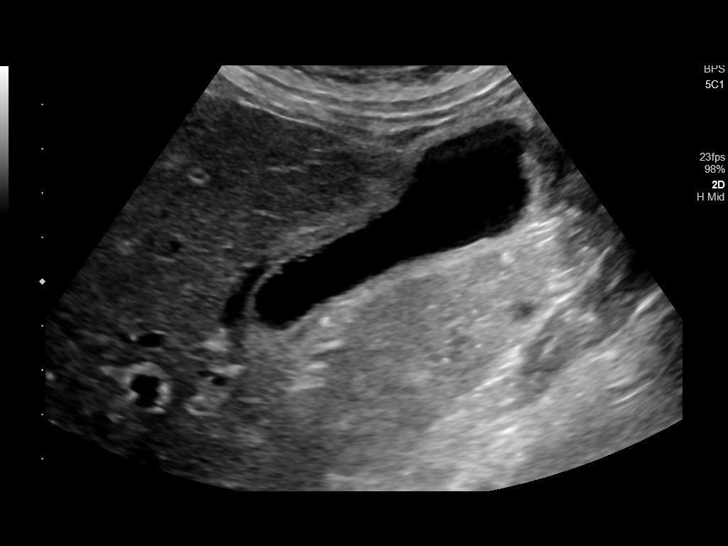
[im 20/79]
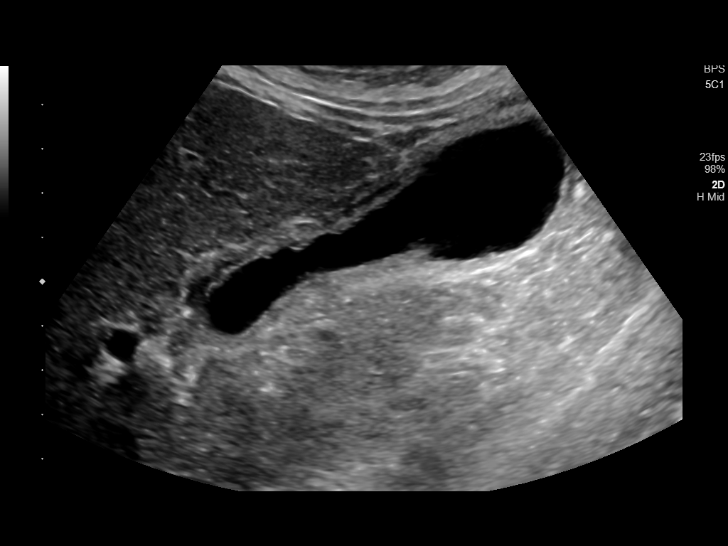
[im 27/79]
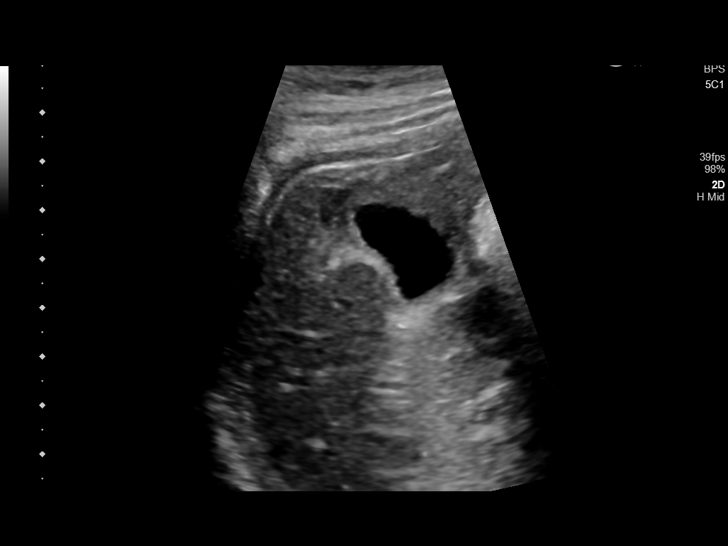
[im 33/79]
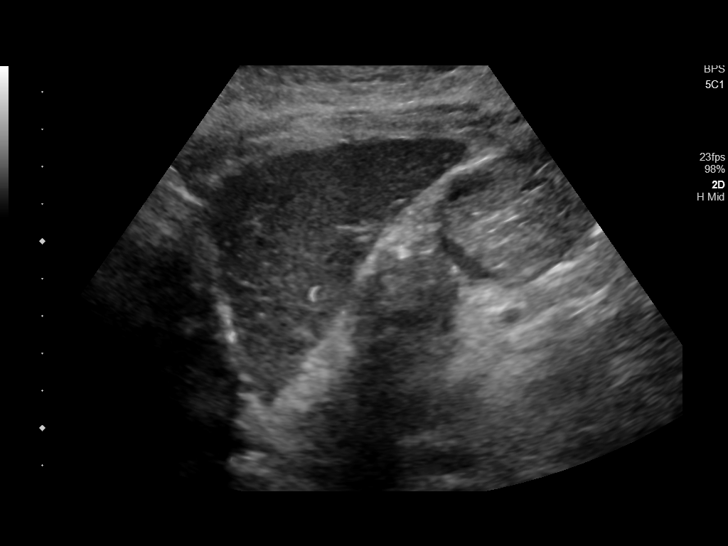
[im 40/79]
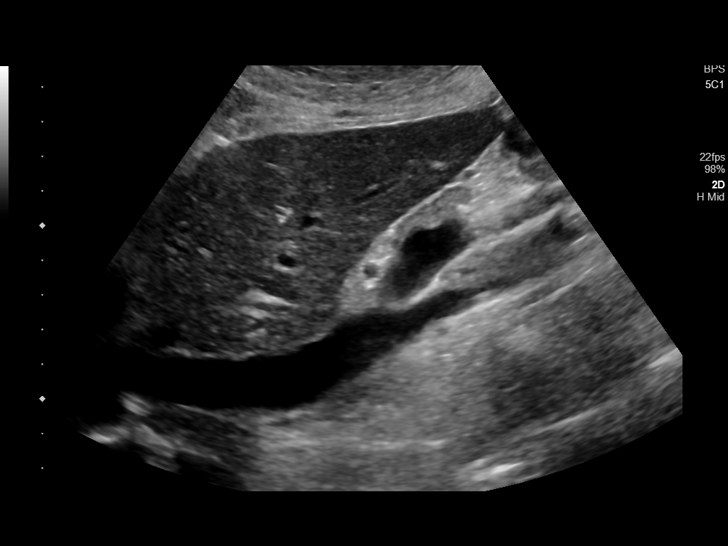
[im 46/79]
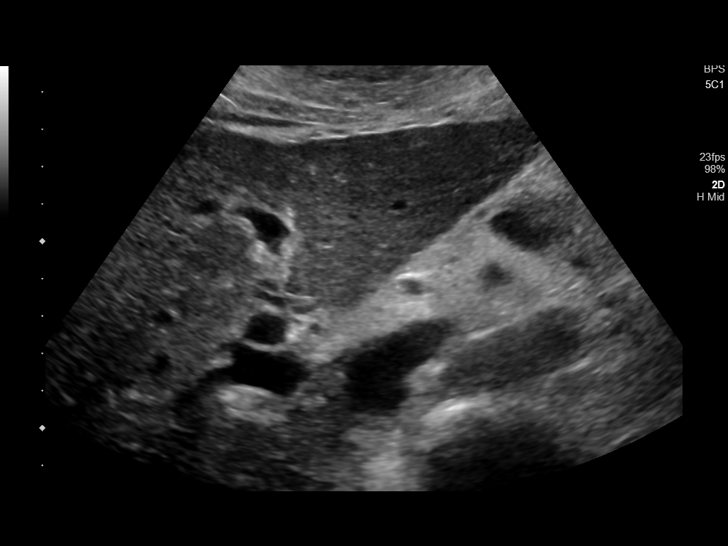
[im 53/79]
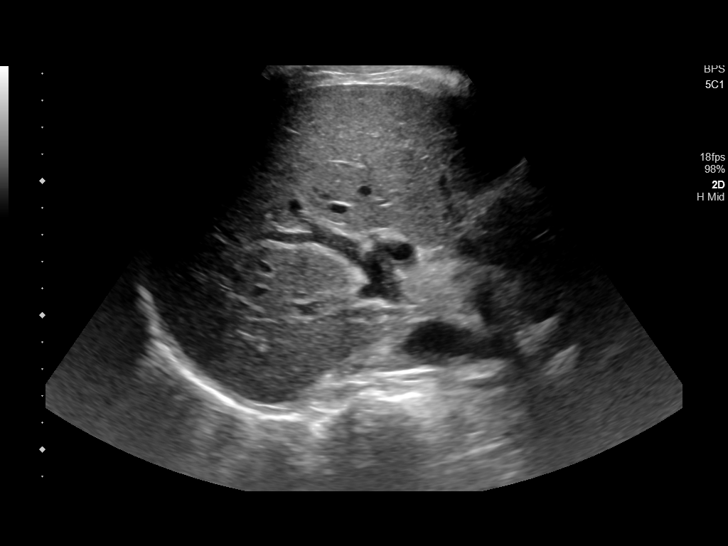
[im 59/79]
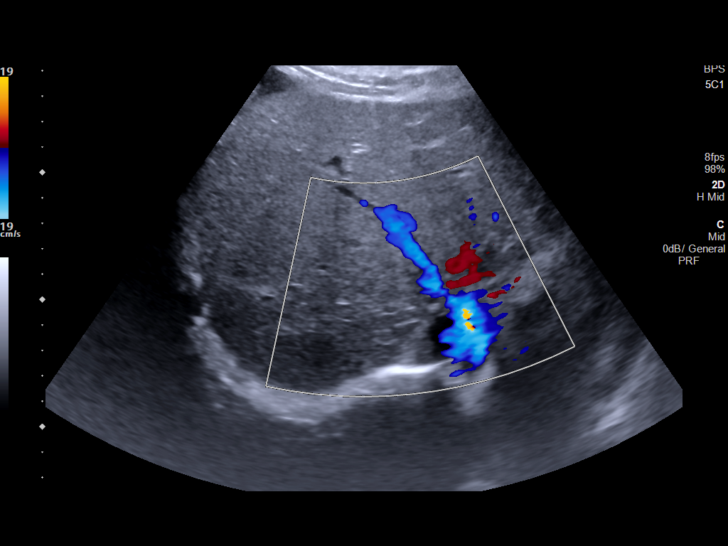
[im 66/79]
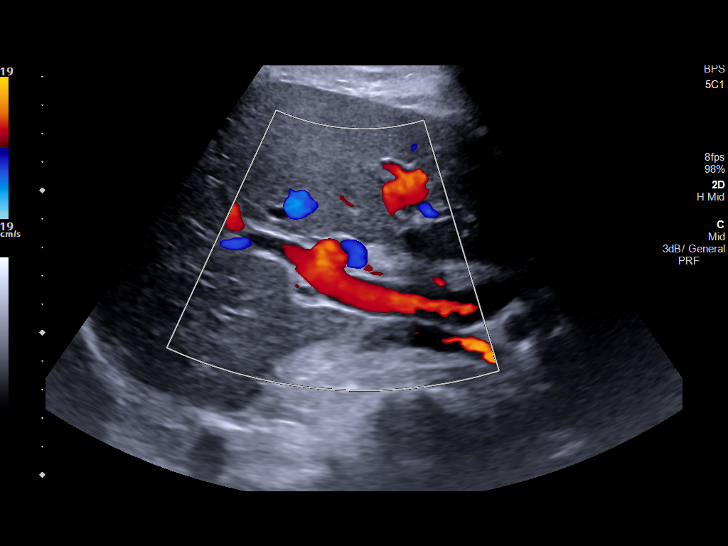
[im 72/79]
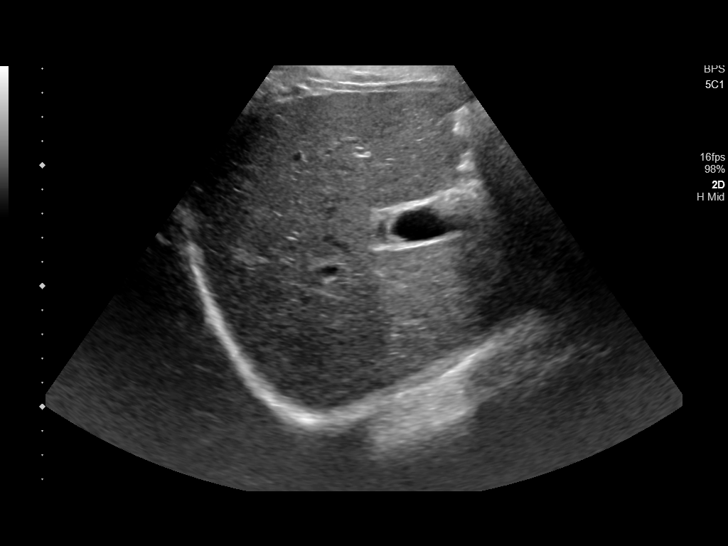
[im 79/79]
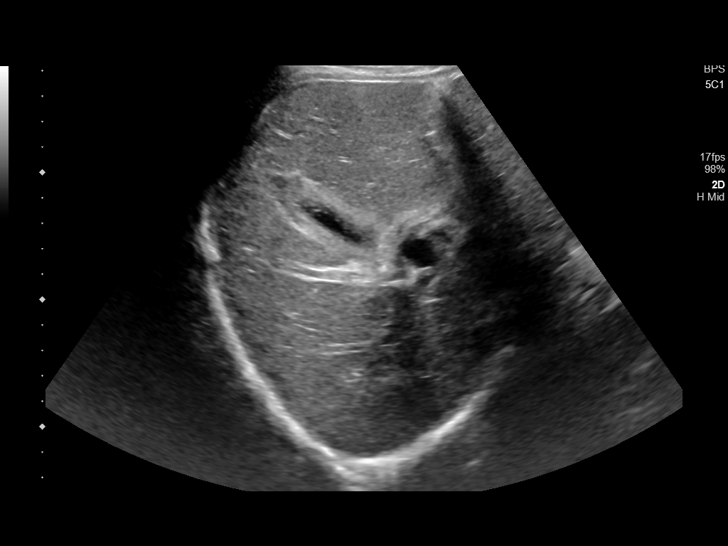

[13 of 25 positions shown; findings below may reference images not displayed]

FINDINGS: Gallbladder:

Gallbladder wall is diffusely thickened measuring up to 7 mm in
thickness. There is prominent pericholecystic edema. Gallbladder is
not dilated. There are no shadowing gallstones evident. No
sonographic Murphy sign noted by sonographer.

Common bile duct:

Diameter: 5 mm

Liver:

Subtly nodular surface contour of the liver with coarsened hepatic
echotexture. No focal lesion identified. Portal vein is patent on
color Doppler imaging with normal direction of blood flow towards
the liver.

Other: None.
IMPRESSION: 1. Heterogeneous appearance of the hepatic parenchyma with coarsened
echotexture and a subtly nodular surface contour. Findings can be
seen in the setting of cirrhosis as well as hepatitis.
2. Diffuse gallbladder wall thickening with associated
pericholecystic edema. Findings can be seen in the setting of acute
cholecystitis, although are not specific. Notably, the sonographer
reported a negative sonographic Murphy's sign. Other hepatic and
systemic processes can also result in these findings including
cirrhosis, hepatitis, and low protein states. If there is clinical
suspicion for acute cholecystitis, a nuclear medicine hepatobiliary
scan could be performed to evaluate patency of the cystic and common
bile ducts.

## 2019-12-28 NOTE — Care Management Important Message (Signed)
Important Message  Patient Details  Name: Susan Navarro MRN: [id number] Date of Birth: September 23, 1961   Medicare Important Message Given:  Yes     Juliann Pulse A Robbi Spells 12/28/2019, 10:59 AM

## 2019-12-28 NOTE — Progress Notes (Signed)
PROGRESS NOTE                                                                                                                                                                                                             Patient Demographics:    Susan Navarro, is a 59 y.o. female, DOB - 06/17/1961, ZOX:[id number]  Admit date - 12/25/2019   Admitting Physician Wyvonnia Dusky, MD  Outpatient Primary MD for the patient is Rochel Brome, MD  LOS - 3    Chief Complaint  Patient presents with   Altered Mental Status       Brief Narrative 59 year old female with uncontrolled type 1 diabetes mellitus (last A1c of 12.2) presented with DKA after missing her insulin dose since she was not feeling well.  Patient also had acute on chronic metabolic encephalopathy, acute on chronic kidney disease stage III and transaminitis.    Subjective:   CBG better controlled.  Denies any abdominal pain, nausea or vomiting.   Assessment  & Plan :    Active Problems:   DKA (diabetic ketoacidoses) ([place]) Uncontrolled type 1 diabetes mellitus.  Reportedly missed her insulin after not feeling well.  DKA resolved with IV fluids, IV insulin, now on Lantus and sliding scale coverage. Diabetic coordinator consult appreciated.  Active symptoms Acute kidney injury superimposed on chronic kidney disease stage IIIb Likely prerenal with dehydration and DKA.  Renal function improving with hydration.  Nephrology consult appreciated.  Avoid nephrotoxins.  Outpatient follow-up.  Acute transaminitis Hepatitis panel negative.  Worsen LFTs in a.m. lab.  Ultrasound abdomen showing cirrhotic changes.  Denies history of alcohol or IV drug use.  Has underlying chronic thrombocytopenia dating back to 2017 with elevated alkaline phosphatase and intermittently high transaminases which has never been addressed in the past..  Ultrasound abdomen shows diffuse  gallbladder thickening with pericholecystic edema.  Cannot rule out acute cholecystitis although Percell Miller sign is negative.  Will check HIDA scan. Discontinue statin.  Monitor LFTs closely.  Check INR. Consult GI.  Malnutrition of moderate degree Continue supplement  Anemia of chronic kidney disease Hemoglobin currently stable.  epo as outpatient   History of stroke Hold statin given transaminitis.   Code Status : Full code  Family Communication  : None  Disposition Plan  : Home pending improvement in her LFTs  Barriers For  Discharge : Active symptoms  Consults  : None  Procedures  : Renal ultrasound, abdominal ultrasound  DVT Prophylaxis  : SCDs  Lab Results  Component Value Date   PLT 53 (L) 12/28/2019    Antibiotics  :    Anti-infectives (From admission, onward)   None        Objective:   Vitals:   12/27/19 2127 12/28/19 0550 12/28/19 0811 12/28/19 0936  BP: 108/76 (!) 144/75 136/73 139/75  Pulse: 80 73 82 82  Resp:  14 17   Temp:  98.3 F (36.8 C) 97.8 F (36.6 C)   TempSrc:  Oral Oral   SpO2:  98% 100%   Weight:      Height:        Wt Readings from Last 3 Encounters:  12/27/19 61.8 kg  10/08/19 64.9 kg  08/05/19 63 kg     Intake/Output Summary (Last 24 hours) at 12/28/2019 1529 Last data filed at 12/28/2019 1300 Gross per 24 hour  Intake 1671.47 ml  Output 1000 ml  Net 671.47 ml     Physical Exam  Gen: not in distress HEENT: Pallor present, moist mucosa, supple neck Chest: clear b/l, no added sounds CVS: N S1&S2, no murmurs,  GI: soft, NT, ND, BS+ Musculoskeletal: warm, no edema     Data Review:    CBC Recent Labs  Lab 12/25/19 1555 12/26/19 0413 12/27/19 0609 12/28/19 0353  WBC 8.8 9.6 5.3 2.0*  HGB 8.8* 8.3* 8.2* 8.8*  HCT 28.1* 24.5* 24.7* 26.7*  PLT 109* 100* 72* 53*  MCV 96.2 89.1 90.8 92.4  MCH 30.1 30.2 30.1 30.4  MCHC 31.3 33.9 33.2 33.0  RDW 12.8 12.7 13.2 13.2  LYMPHSABS 0.5*  --   --   --   MONOABS 0.3   --   --   --   EOSABS 0.0  --   --   --   BASOSABS 0.0  --   --   --     Chemistries  Recent Labs  Lab 12/25/19 1555 12/26/19 0152 12/26/19 0413 12/26/19 1104 12/27/19 0609 12/28/19 0353  NA 125* 131* 134* 138 139 138  K 5.8* 4.1 4.1 4.2 4.5 4.2  CL 86* 97* 98 100 106 106  CO2 15* 22 22 25 25 26   GLUCOSE 1,075* 773* 644* 167* 244* 150*  BUN 68* 79* 78* 80* 78* 69*  CREATININE 2.66* 2.96* 2.96* 3.06* 2.63* 2.10*  CALCIUM 9.3 8.8* 9.0 9.2 8.6* 8.6*  MG  --   --  2.0  --  1.9 1.8  AST 20  --  23  --  61* 346*  ALT 42  --  41  --  60* 280*  ALKPHOS 313*  --  270*  --  221* 264*  BILITOT 1.3*  --  0.6  --  0.9 1.0   ------------------------------------------------------------------------------------------------------------------ No results for input(s): CHOL, HDL, LDLCALC, TRIG, CHOLHDL, LDLDIRECT in the last 72 hours.  Lab Results  Component Value Date   HGBA1C 12.2 (H) 10/08/2019   ------------------------------------------------------------------------------------------------------------------ No results for input(s): TSH, T4TOTAL, T3FREE, THYROIDAB in the last 72 hours.  Invalid input(s): FREET3 ------------------------------------------------------------------------------------------------------------------ No results for input(s): VITAMINB12, FOLATE, FERRITIN, TIBC, IRON, RETICCTPCT in the last 72 hours.  Coagulation profile No results for input(s): INR, PROTIME in the last 168 hours.  No results for input(s): DDIMER in the last 72 hours.  Cardiac Enzymes No results for input(s): CKMB, TROPONINI, MYOGLOBIN in the last 168 hours.  Invalid input(s): CK ------------------------------------------------------------------------------------------------------------------  No results found for: BNP  Inpatient Medications  Scheduled Meds:  aspirin  81 mg Oral Daily   enoxaparin (LOVENOX) injection  30 mg Subcutaneous Q24H   insulin aspart  0-5 Units Subcutaneous  QHS   insulin aspart  0-9 Units Subcutaneous TID WC   insulin aspart  2 Units Subcutaneous TID WC   insulin glargine  10 Units Subcutaneous Q24H   metoprolol tartrate  25 mg Oral BID   Ensure Max Protein  11 oz Oral BID BM   sodium chloride flush  3 mL Intravenous Q12H   Continuous Infusions:  sodium chloride 50 mL/hr at 12/28/19 0455   PRN Meds:.ondansetron **OR** ondansetron (ZOFRAN) IV, senna-docusate, traMADol  Micro Results Recent Results (from the past 240 hour(s))  Urine Culture     Status: Abnormal   Collection Time: 12/26/19  4:13 AM   Specimen: Urine, Random  Result Value Ref Range Status   Specimen Description   Final    URINE, RANDOM Performed at [facility], [address], [place], [place] [address]    Special Requests   Final    NONE Performed at [facility], [address] [place], [place] [address]    Culture MULTIPLE SPECIES PRESENT, SUGGEST RECOLLECTION (A)  Final   Report Status 12/27/2019 FINAL  Final  SARS CORONAVIRUS 2 (TAT 6-24 HRS) Nasopharyngeal Nasopharyngeal Swab     Status: None   Collection Time: 12/26/19  5:15 PM   Specimen: Nasopharyngeal Swab  Result Value Ref Range Status   SARS Coronavirus 2 NEGATIVE NEGATIVE Final    Comment: (NOTE) SARS-CoV-2 target nucleic acids are NOT DETECTED. The SARS-CoV-2 RNA is generally detectable in upper and lower respiratory specimens during the acute phase of infection. Negative results do not preclude SARS-CoV-2 infection, do not rule out co-infections with other pathogens, and should not be used as the sole basis for treatment or other patient management decisions. Negative results must be combined with clinical observations, patient history, and epidemiological information. The expected result is Negative. Fact Sheet for Patients: SugarRoll.be Fact Sheet for Healthcare Providers: https://www.woods-mathews.com/ This test is not yet  approved or cleared by the [place] FDA and  has been authorized for detection and/or diagnosis of SARS-CoV-2 by FDA under an Emergency Use Authorization (EUA). This EUA will remain  in effect (meaning this test can be used) for the duration of the COVID-19 declaration under Section 56 4(b)(1) of the Act, 21 U.S.C. section 360bbb-3(b)(1), unless the authorization is terminated or revoked sooner. Performed at [facility], [place] [address] [place], [place] [address]     Radiology Reports CT Head Wo Contrast  Result Date: 12/25/2019 CLINICAL DATA:  Encephalopathy EXAM: CT HEAD WITHOUT CONTRAST TECHNIQUE: Contiguous axial images were obtained from the base of the skull through the vertex without intravenous contrast. COMPARISON:  None. FINDINGS: Brain: No definite evidence of acute infarction, hemorrhage, hydrocephalus, extra-axial collection or mass lesion/mass effect. There are multiple bilateral cerebral hypodensities, a large, encephalomalacic appearing hypodensity of the frontoparietal right hemisphere (series 2, image 19), of the left frontal pole (series 2, image 70), and of the parietooccipital left hemisphere (series 2, image 16). Vascular: No hyperdense vessel or unexpected calcification. Skull: Normal. Negative for fracture or focal lesion. Sinuses/Orbits: No acute finding. Other: None. IMPRESSION: 1. Multiple bilateral cerebral hypodensities, a large, encephalomalacic appearing hypodensity of the frontoparietal right hemisphere, of the left frontal pole, and of the parietooccipital left hemisphere. 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 2. Consider  MR examination of the brain to better evaluate for acute diffusion restricting infarction if suspected. Electronically Signed   By: Eddie Candle M.D.   On: 12/25/2019 17:46   MR BRAIN WO CONTRAST  Result Date: 12/25/2019 CLINICAL DATA:  Encephalopathy.  Multiple old infarctions by CT. EXAM: MRI HEAD WITHOUT CONTRAST  TECHNIQUE: Multiplanar, multiecho pulse sequences of the brain and surrounding structures were obtained without intravenous contrast. COMPARISON:  Head CT same day FINDINGS: Brain: The patient would only allow diffusion imaging. Diffusion imaging does not show any acute or subacute infarction. As shown by CT, there are old small vessel cerebellar infarctions bilaterally, and old left occipital cortical infarction, old left parietal cortical infarction, old left frontal cortical infarction, and old right frontoparietal cortical infarction. Some dystrophic calcification associated with the right frontoparietal infarction. No sign of mass lesion, recent hemorrhage, hydrocephalus or extra-axial collection. Vascular: Major vessels at the base of the brain show flow. Skull and upper cervical spine: Negative Sinuses/Orbits: Clear/normal Other: None IMPRESSION: Patient allowed only diffusion imaging to be performed. Diffusion imaging does not show any acute or subacute infarction. Extensive old infarctions throughout the brain as outlined above. Electronically Signed   By: Nelson Chimes M.D.   On: 12/25/2019 19:34   US Renal  Result Date: 12/27/2019 CLINICAL DATA:  59 year old female with acute renal failure. EXAM: RENAL / URINARY TRACT ULTRASOUND COMPLETE COMPARISON:  Renal ultrasound 12/10/2018. FINDINGS: Right Kidney: Renal measurements: 7.8 x 2.9 x 4.4 centimeters = volume: 53 mL (previously 84 mL). Echogenic right kidney (image 7) with no hydronephrosis or solid renal mass. Left Kidney: Renal measurements: 8.3 x 4.3 x 4.0 centimeters = volume: 76 mL (previously 69 mL). Left renal cortex echogenicity appears more normal. No left hydronephrosis or solid renal mass. Bladder: Abundant echogenic urinary debris within the bladder (image 28). Confluent debris depicted on image 34. Mild circumferential bladder wall thickening. Other: Abnormal appearance of the gallbladder with suggestion of pericholecystic fluid (image 15)  and layering sludge. Gallbladder wall thickness seems to remain normal. IMPRESSION: 1. Abundant debris within the urinary bladder. Recommend correlation with urinalysis. 2. No acute renal findings. 3. Abnormal gallbladder with layering sludge and pericholecystic fluid. The appearance is nonspecific. If there is right upper quadrant abdominal pain recommend dedicated gallbladder ultrasound. Electronically Signed   By: Genevie Ann M.D.   On: 12/27/2019 14:01   US Abdomen Limited RUQ  Result Date: 12/28/2019 CLINICAL DATA:  Abnormal appearance of the gallbladder on previous study. Transaminitis. EXAM: ULTRASOUND ABDOMEN LIMITED RIGHT UPPER QUADRANT COMPARISON:  Renal ultrasound 12/27/2019 FINDINGS: Gallbladder: Gallbladder wall is diffusely thickened measuring up to 7 mm in thickness. There is prominent pericholecystic edema. Gallbladder is not dilated. There are no shadowing gallstones evident. No sonographic Murphy sign noted by sonographer. Common bile duct: Diameter: 5 mm Liver: Subtly nodular surface contour of the liver with coarsened hepatic echotexture. No focal lesion identified. Portal vein is patent on color Doppler imaging with normal direction of blood flow towards the liver. Other: None. IMPRESSION: 1. Heterogeneous appearance of the hepatic parenchyma with coarsened echotexture and a subtly nodular surface contour. Findings can be seen in the setting of cirrhosis as well as hepatitis. 2. Diffuse gallbladder wall thickening with associated pericholecystic edema. Findings can be seen in the setting of acute cholecystitis, although are not specific. Notably, the sonographer reported a negative sonographic Murphy's sign. Other hepatic and systemic processes can also result in these findings including cirrhosis, hepatitis, and low protein states. If there is clinical suspicion  for acute cholecystitis, a nuclear medicine hepatobiliary scan could be performed to evaluate patency of the cystic and common bile  ducts. Electronically Signed   By: Davina Poke D.O.   On: 12/28/2019 09:09    Time Spent in minutes  35   Crista Nuon M.D on 12/28/2019 at 3:29 PM  Between 7am to 7pm - Pager - [phone]  After 7pm go to [email or web address] - password [facility]  [facility] -  Office  [phone]

## 2019-12-28 NOTE — Progress Notes (Signed)
[facility]  ROUNDING NOTE   Subjective:  Patient seen and evaluated at bedside. Creatinine now down to 2.10 with a BUN of 69. Patient feeling much better as compared to admission.   Objective:  Vital signs in last 24 hours:  Temp:  [97.8 F (36.6 C)-98.4 F (36.9 C)] 97.8 F (36.6 C) (01/06 0811) Pulse Rate:  [73-82] 82 (01/06 0936) Resp:  [14-18] 17 (01/06 0811) BP: (108-144)/(70-76) 139/75 (01/06 0936) SpO2:  [98 %-100 %] 100 % (01/06 0811) Weight:  [61.8 kg] 61.8 kg (01/05 1540)  Weight change: -3.972 kg Filed Weights   12/25/19 1606 12/26/19 0733 12/27/19 1540  Weight: 65.8 kg 65.8 kg 61.8 kg    Intake/Output: I/O last 3 completed shifts: In: 1962.8 [P.O.:240; I.V.:1722.8] Out: 800 [Urine:800]   Intake/Output this shift:  Total I/O In: -  Out: 200 [Urine:200]  Physical Exam: General: No acute distress  Head: Normocephalic, atraumatic. Moist oral mucosal membranes  Eyes: Anicteric  Neck: Supple, trachea midline  Lungs:  Clear to auscultation, normal effort  Heart: S1S2 no rubs  Abdomen:  Soft, nontender, bowel sounds present  Extremities: No peripheral edema.  Neurologic: Awake, alert, following commands  Skin: No lesions       Basic Metabolic Panel: Recent Labs  Lab 12/26/19 0152 12/26/19 0413 12/26/19 1104 12/27/19 0609 12/28/19 0353  NA 131* 134* 138 139 138  K 4.1 4.1 4.2 4.5 4.2  CL 97* 98 100 106 106  CO2 _0 GLUCOSE 773* 644* 167* 244* 150*  BUN 79* 78* 80* 78* 69*  CREATININE 2.96* 2.96* 3.06* 2.63* 2.10*  CALCIUM 8.8* 9.0 9.2 8.6* 8.6*  MG  --  2.0  --  1.9 1.8  PHOS  --  2.8  --  2.5 2.8    Liver Function Tests: Recent Labs  Lab 12/25/19 1555 12/26/19 0413 12/27/19 0609 12/28/19 0353  AST 20 23 61* 346*  ALT 42 41 60* 280*  ALKPHOS 313* 270* 221* 264*  BILITOT 1.3* 0.6 0.9 1.0  PROT 6.6 6.0* 5.3* 5.5*  ALBUMIN 3.7 3.4* 2.9* 2.9*   No results for input(s): LIPASE, AMYLASE in the last 168  hours. No results for input(s): AMMONIA in the last 168 hours.  CBC: Recent Labs  Lab 12/25/19 1555 12/26/19 0413 12/27/19 0609 12/28/19 0353  WBC 8.8 9.6 5.3 2.0*  NEUTROABS 8.0*  --   --   --   HGB 8.8* 8.3* 8.2* 8.8*  HCT 28.1* 24.5* 24.7* 26.7*  MCV 96.2 89.1 90.8 92.4  PLT 109* 100* 72* 53*    Cardiac Enzymes: No results for input(s): CKTOTAL, CKMB, CKMBINDEX, TROPONINI in the last 168 hours.  BNP: Invalid input(s): POCBNP  CBG: Recent Labs  Lab 12/27/19 1641 12/27/19 2127 12/28/19 0813 12/28/19 0923 12/28/19 1146  GLUCAP 132* 155* 192* 207* 318*    Microbiology: Results for orders placed or performed during the hospital encounter of 12/25/19  Urine Culture     Status: Abnormal   Collection Time: 12/26/19  4:13 AM   Specimen: Urine, Random  Result Value Ref Range Status   Specimen Description   Final    URINE, RANDOM Performed at [facility], [address] [place], [place] [address]    Special Requests   Final    NONE Performed at [facility], [address], [place], [place] [address]    Culture MULTIPLE SPECIES PRESENT, SUGGEST RECOLLECTION (A)  Final   Report Status 12/27/2019 FINAL  Final  SARS CORONAVIRUS 2 (TAT 6-24 HRS) Nasopharyngeal Nasopharyngeal Swab     Status: None   Collection Time: 12/26/19  5:15 PM   Specimen: Nasopharyngeal Swab  Result Value Ref Range Status   SARS Coronavirus 2 NEGATIVE NEGATIVE Final    Comment: (NOTE) SARS-CoV-2 target nucleic acids are NOT DETECTED. The SARS-CoV-2 RNA is generally detectable in upper and lower respiratory specimens during the acute phase of infection. Negative results do not preclude SARS-CoV-2 infection, do not rule out co-infections with other pathogens, and should not be used as the sole basis for treatment or other patient management decisions. Negative results must be combined with clinical observations, patient history, and epidemiological information. The  expected result is Negative. Fact Sheet for Patients: SugarRoll.be Fact Sheet for Healthcare Providers: https://www.woods-mathews.com/ This test is not yet approved or cleared by the [place] FDA and  has been authorized for detection and/or diagnosis of SARS-CoV-2 by FDA under an Emergency Use Authorization (EUA). This EUA will remain  in effect (meaning this test can be used) for the duration of the COVID-19 declaration under Section 56 4(b)(1) of the Act, 21 U.S.C. section 360bbb-3(b)(1), unless the authorization is terminated or revoked sooner. Performed at [facility], [place] [address] [place], [place] [address]     Coagulation Studies: No results for input(s): LABPROT, INR in the last 72 hours.  Urinalysis: Recent Labs    12/26/19 0414  COLORURINE YELLOW*  LABSPEC 1.016  PHURINE 5.0  GLUCOSEU >=500*  HGBUR NEGATIVE  BILIRUBINUR NEGATIVE  KETONESUR 5*  PROTEINUR NEGATIVE  NITRITE NEGATIVE  LEUKOCYTESUR NEGATIVE      Imaging: US Renal  Result Date: 12/27/2019 CLINICAL DATA:  59 year old female with acute renal failure. EXAM: RENAL / URINARY TRACT ULTRASOUND COMPLETE COMPARISON:  Renal ultrasound 12/10/2018. FINDINGS: Right Kidney: Renal measurements: 7.8 x 2.9 x 4.4 centimeters = volume: 53 mL (previously 84 mL). Echogenic right kidney (image 7) with no hydronephrosis or solid renal mass. Left Kidney: Renal measurements: 8.3 x 4.3 x 4.0 centimeters = volume: 76 mL (previously 69 mL). Left renal cortex echogenicity appears more normal. No left hydronephrosis or solid renal mass. Bladder: Abundant echogenic urinary debris within the bladder (image 28). Confluent debris depicted on image 34. Mild circumferential bladder wall thickening. Other: Abnormal appearance of the gallbladder with suggestion of pericholecystic fluid (image 15) and layering sludge. Gallbladder wall thickness seems to remain normal. IMPRESSION: 1.  Abundant debris within the urinary bladder. Recommend correlation with urinalysis. 2. No acute renal findings. 3. Abnormal gallbladder with layering sludge and pericholecystic fluid. The appearance is nonspecific. If there is right upper quadrant abdominal pain recommend dedicated gallbladder ultrasound. Electronically Signed   By: Genevie Ann M.D.   On: 12/27/2019 14:01   US Abdomen Limited RUQ  Result Date: 12/28/2019 CLINICAL DATA:  Abnormal appearance of the gallbladder on previous study. Transaminitis. EXAM: ULTRASOUND ABDOMEN LIMITED RIGHT UPPER QUADRANT COMPARISON:  Renal ultrasound 12/27/2019 FINDINGS: Gallbladder: Gallbladder wall is diffusely thickened measuring up to 7 mm in thickness. There is prominent pericholecystic edema. Gallbladder is not dilated. There are no shadowing gallstones evident. No sonographic Murphy sign noted by sonographer. Common bile duct: Diameter: 5 mm Liver: Subtly nodular surface contour of the liver with coarsened hepatic echotexture. No focal lesion identified. Portal vein is patent on color Doppler imaging with normal direction of blood flow towards the liver. Other: None. IMPRESSION: 1. Heterogeneous appearance of the hepatic parenchyma with coarsened echotexture and a subtly nodular surface contour. Findings can be seen  in the setting of cirrhosis as well as hepatitis. 2. Diffuse gallbladder wall thickening with associated pericholecystic edema. Findings can be seen in the setting of acute cholecystitis, although are not specific. Notably, the sonographer reported a negative sonographic Murphy's sign. Other hepatic and systemic processes can also result in these findings including cirrhosis, hepatitis, and low protein states. If there is clinical suspicion for acute cholecystitis, a nuclear medicine hepatobiliary scan could be performed to evaluate patency of the cystic and common bile ducts. Electronically Signed   By: Davina Poke D.O.   On: 12/28/2019 09:09      Medications:   . sodium chloride 50 mL/hr at 12/28/19 0455   . aspirin  81 mg Oral Daily  . enoxaparin (LOVENOX) injection  30 mg Subcutaneous Q24H  . insulin aspart  0-5 Units Subcutaneous QHS  . insulin aspart  0-9 Units Subcutaneous TID WC  . insulin aspart  2 Units Subcutaneous TID WC  . insulin glargine  10 Units Subcutaneous Q24H  . metoprolol tartrate  25 mg Oral BID  . Ensure Max Protein  11 oz Oral BID BM  . sodium chloride flush  3 mL Intravenous Q12H   ondansetron **OR** ondansetron (ZOFRAN) IV, senna-docusate, traMADol  Assessment/ Plan:  59 y.o. female with a PMHx of longstanding diabetes mellitus type 1, hypertension, history of CVA, chronic kidney disease stage IIIb EGFR 32, who was admitted to [facility] on 12/25/2019 for evaluation of nausea and vomiting.   1.  Acute kidney injury/chronic kidney disease stage IIIb baseline EGFR 32/diabetes mellitus type 1 with chronic kidney disease.  Acute kidney injury secondary to nausea, vomiting, and severe hyperglycemia upon admission.  Both kidneys noted to be atrophic on renal ultrasound which is consistent with chronic kidney disease.  Advised the patient that she will need follow-up for her underlying chronic kidney disease.  Continue hydration for now.  2.  Anemia of chronic kidney disease.  Hemoglobin currently 8.8.  Consider Epogen as an outpatient.   LOS: 3 Susan Navarro 1/6/202112:50 PM

## 2019-12-29 ENCOUNTER — Encounter: Payer: Self-pay | Admitting: Internal Medicine

## 2019-12-29 ENCOUNTER — Inpatient Hospital Stay: Payer: Medicare HMO

## 2019-12-29 DIAGNOSIS — E0865 Diabetes mellitus due to underlying condition with hyperglycemia: Secondary | ICD-10-CM

## 2019-12-29 DIAGNOSIS — Z794 Long term (current) use of insulin: Secondary | ICD-10-CM

## 2019-12-29 DIAGNOSIS — E440 Moderate protein-calorie malnutrition: Secondary | ICD-10-CM

## 2019-12-29 DIAGNOSIS — R748 Abnormal levels of other serum enzymes: Secondary | ICD-10-CM

## 2019-12-29 LAB — HEPATITIS B CORE ANTIBODY, TOTAL: Hep B Core Total Ab: NONREACTIVE

## 2019-12-29 LAB — PROTEIN ELECTRO, RANDOM URINE
Albumin ELP, Urine: 26.2 %
Alpha-1-Globulin, U: 0.5 %
Alpha-2-Globulin, U: 12.8 %
Beta Globulin, U: 38.8 %
Gamma Globulin, U: 21.7 %
M Component, Ur: 19.5 % — ABNORMAL HIGH
Total Protein, Urine: 21.2 mg/dL

## 2019-12-29 LAB — GLUCOSE, CAPILLARY
Glucose-Capillary: 170 mg/dL — ABNORMAL HIGH (ref 70–99)
Glucose-Capillary: 313 mg/dL — ABNORMAL HIGH (ref 70–99)
Glucose-Capillary: 425 mg/dL — ABNORMAL HIGH (ref 70–99)
Glucose-Capillary: 252 mg/dL — ABNORMAL HIGH (ref 70–99)

## 2019-12-29 LAB — COMPREHENSIVE METABOLIC PANEL
ALT: 220 U/L — ABNORMAL HIGH (ref 0–44)
AST: 158 U/L — ABNORMAL HIGH (ref 15–41)
Albumin: 2.5 g/dL — ABNORMAL LOW (ref 3.5–5.0)
Alkaline Phosphatase: 291 U/L — ABNORMAL HIGH (ref 38–126)
Anion gap: 5 (ref 5–15)
BUN: 64 mg/dL — ABNORMAL HIGH (ref 6–20)
CO2: 27 mmol/L (ref 22–32)
Calcium: 8.3 mg/dL — ABNORMAL LOW (ref 8.9–10.3)
Chloride: 107 mmol/L (ref 98–111)
Creatinine, Ser: 1.76 mg/dL — ABNORMAL HIGH (ref 0.44–1.00)
GFR calc Af Amer: 36 mL/min — ABNORMAL LOW (ref >60)
GFR calc non Af Amer: 31 mL/min — ABNORMAL LOW (ref >60)
Glucose, Bld: 151 mg/dL — ABNORMAL HIGH (ref 70–99)
Potassium: 3.9 mmol/L (ref 3.5–5.1)
Sodium: 139 mmol/L (ref 135–145)
Total Bilirubin: 0.7 mg/dL (ref 0.3–1.2)
Total Protein: 5 g/dL — ABNORMAL LOW (ref 6.5–8.1)

## 2019-12-29 LAB — CBC
HCT: 26.9 % — ABNORMAL LOW (ref 36.0–46.0)
Hemoglobin: 8.8 g/dL — ABNORMAL LOW (ref 12.0–15.0)
MCH: 30.2 pg (ref 26.0–34.0)
MCHC: 32.7 g/dL (ref 30.0–36.0)
MCV: 92.4 fL (ref 80.0–100.0)
Platelets: 54 10*3/uL — ABNORMAL LOW (ref 150–400)
RBC: 2.91 MIL/uL — ABNORMAL LOW (ref 3.87–5.11)
RDW: 13.1 % (ref 11.5–15.5)
WBC: 2.7 10*3/uL — ABNORMAL LOW (ref 4.0–10.5)
nRBC: 0 % (ref 0.0–0.2)

## 2019-12-29 LAB — FERRITIN: Ferritin: 268 ng/mL (ref 11–307)

## 2019-12-29 LAB — PHOSPHORUS: Phosphorus: 3.2 mg/dL (ref 2.5–4.6)

## 2019-12-29 LAB — IRON AND TIBC
Iron: 71 ug/dL (ref 28–170)
Saturation Ratios: 29 % (ref 10.4–31.8)
TIBC: 245 ug/dL — ABNORMAL LOW (ref 250–450)
UIBC: 174 ug/dL

## 2019-12-29 LAB — GAMMA GT: GGT: 338 U/L — ABNORMAL HIGH (ref 7–50)

## 2019-12-29 LAB — MAGNESIUM: Magnesium: 1.5 mg/dL — ABNORMAL LOW (ref 1.7–2.4)

## 2019-12-29 IMAGING — NM NM HEPATO W/GB/PHARM/[PERSON_NAME]
4 series · 16 of 16 positions shown · non-contrast
Comparison: Ultrasound [DATE].

CLINICAL DATA: Cholecystitis.

EXAM:
NUCLEAR MEDICINE HEPATOBILIARY IMAGING WITH GALLBLADDER EF
TECHNIQUE: Sequential images of the abdomen were obtained [DATE] minutes
following intravenous administration of radiopharmaceutical. After
oral ingestion of Ensure, gallbladder ejection fraction was
determined.
RADIOPHARMACEUTICALS:  5.4 mCi [DF]  Choletec IV

[Series 1000: hepatobiliary scan · 9.59mm/px · 6 of 60 frames shown]
[frame 6/60]
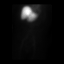
[frame 16/60]
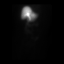
[frame 26/60]
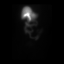
[frame 36/60]
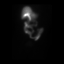
[frame 46/60]
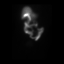
[frame 56/60]
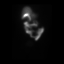

[Series 1000: gallbladder ef · 4.80mm/px · 6 of 120 frames shown]
[frame 11/120]
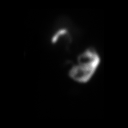
[frame 31/120]
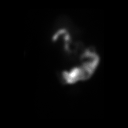
[frame 51/120]
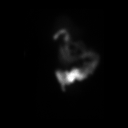
[frame 71/120]
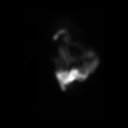
[frame 91/120]
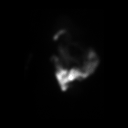
[frame 111/120]
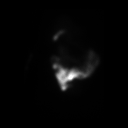

[Series 1000: gallbladder delays · 3.30mm/px · 3 of 3 slices shown (1 of 2)]
[im 1/3]
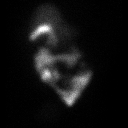
[im 2/3]
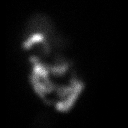
[im 3/3]
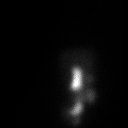

[Series 1000: gallbladder delays · 3.30mm/px · 1 of 1 slices shown (2 of 2)]
[im 1/1]
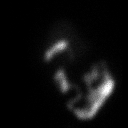

[16 of 16 positions shown; findings below may reference images not displayed]

FINDINGS: Prompt uptake and biliary excretion of activity by the liver is
seen. Gallbladder activity is visualized, consistent with patency of
cystic duct. Biliary activity passes into small bowel, consistent
with patent common bile duct.

Gallbladder ejection fraction was determined at 2 hours to ensure
that measurement of the gallbladder was obtained. Calculated
gallbladder ejection fraction is 67%. (Normal gallbladder ejection
fraction with Ensure is greater than 33%.)
IMPRESSION: Normal exam.

## 2019-12-29 MED ORDER — TECHNETIUM TC 99M MEBROFENIN IV KIT
5.4160 | PACK | Freq: Once | INTRAVENOUS | Status: AC | PRN
  Administered 2019-12-29: 5.416 via INTRAVENOUS

## 2019-12-29 MED ORDER — MAGNESIUM SULFATE 2 GM/50ML IV SOLN
2.0000 g | Freq: Once | INTRAVENOUS | Status: AC
  Administered 2019-12-29: 2 g via INTRAVENOUS
  Filled 2019-12-29: qty 50

## 2019-12-29 MED ORDER — INSULIN ASPART 100 UNIT/ML ~~LOC~~ SOLN
5.0000 [IU] | Freq: Three times a day (TID) | SUBCUTANEOUS | Status: DC
  Administered 2019-12-29 (×2): 5 [IU] via SUBCUTANEOUS
  Filled 2019-12-29 (×2): qty 1

## 2019-12-29 NOTE — Consult Note (Signed)
Susan Antigua, MD [address] [place], [place], [place], [address] [id number] [place], [place], [place], [address] Phone: [phone]  Fax: [phone]  Consultation  Referring Provider:     Dr. Clementeen Graham  Primary Care Physician:  Rochel Brome, MD Reason for Consultation:    Elevated liver enzymes  Date of Admission:  12/25/2019 Date of Consultation:  12/29/2019         HPI:   Susan Navarro is a 59 y.o. female admitted with DKA, with GI being consulted for elevated liver enzymes.  Patient has had elevated transaminases and alk phos chronically, dating back to at least 2019.  Bilirubin is normal. She denies knowing of any liver problems. Denies current or previous alcohol use. Denies any recent tylenol use or herbal products or supplements.   Acute hepatitis panel on this admission was negative.  Viral hepatitis work-up in December 2019 was negative as well, except for positive hepatitis B surface antibody.  INR is normal, with chronically low platelets.  Right upper quadrant ultrasound on this admission shows heterogenous hepatic parenchyma with coarsened architecture and subtly nodular surface counter, suggesting cirrhosis.  Diffuse gallbladder wall thickening and pericholecystic edema was also reported.  HIDA scan today was negative.  Past Medical History:  Diagnosis Date  . Anemia   . Chronic kidney disease   . Diabetes mellitus without complication ([place])   . Hypertension   . Stroke [facility]) 03/2018   Ischemic stroke per [facility] records    History reviewed. No pertinent surgical history.  Prior to Admission medications   Medication Sig Start Date End Date Taking? Authorizing Provider  amLODipine (NORVASC) 5 MG tablet Take 5 mg by mouth daily. 12/15/19  Yes [provider]  aspirin EC 81 MG tablet Take 81 mg by mouth daily. 02/09/17  Yes [provider]  atorvastatin (LIPITOR) 80 MG tablet Take 80 mg by mouth every evening. 11/23/18 12/25/19 Yes  [provider]  Insulin Glargine (LANTUS SOLOSTAR) 100 UNIT/ML Solostar Pen Inject 10 Units into the skin daily.    Yes [provider]  insulin lispro (HUMALOG KWIKPEN) 100 UNIT/ML KwikPen Inject 0.1 mLs (10 Units total) into the skin 3 (three) times daily before meals. 10/08/19 10/07/20 Yes Fritzi Mandes, MD  lisinopril (ZESTRIL) 2.5 MG tablet Take 2.5 mg by mouth every evening.    Yes [provider]  metoprolol tartrate (LOPRESSOR) 25 MG tablet Take 25 mg by mouth 2 (two) times daily.    Yes [provider]    Family History  Problem Relation Age of Onset  . Diabetes Mother   . Diabetes Brother      Social History   Tobacco Use  . Smoking status: Never Smoker  . Smokeless tobacco: Never Used  Substance Use Topics  . Alcohol use: No  . Drug use: Not on file    Allergies as of 12/25/2019  . (No Known Allergies)    Review of Systems:    All systems reviewed and negative except where noted in HPI.   Physical Exam:  Vital signs in last 24 hours: Vitals:   12/28/19 0936 12/28/19 1530 12/28/19 2209 12/28/19 2306  BP: 139/75 (!) 142/71 122/60 (!) 100/57  Pulse: 82 83 78 88  Resp:  18  16  Temp:  98.4 F (36.9 C)  98.9 F (37.2 C)  TempSrc:  Oral  Oral  SpO2:  100%  98%  Weight:      Height:  Last BM Date: 12/28/19 General:   Pleasant, cooperative in NAD Head:  Normocephalic and atraumatic. Eyes:   No icterus.   Conjunctiva pink. PERRLA. Ears:  Normal auditory acuity. Neck:  Supple; no masses or thyroidomegaly Lungs: Respirations even and unlabored. Lungs clear to auscultation bilaterally.   No wheezes, crackles, or rhonchi.  Abdomen:  Soft, nondistended, nontender. Normal bowel sounds. No appreciable masses or hepatomegaly.  No rebound or guarding.  Neurologic:  Alert and oriented x3;  grossly normal neurologically. Skin:  Intact without significant lesions or rashes. Cervical Nodes:  No significant cervical  adenopathy. Psych:  Alert and cooperative. Normal affect.  LAB RESULTS: Recent Labs    12/27/19 0609 12/28/19 0353 12/29/19 0444  WBC 5.3 2.0* 2.7*  HGB 8.2* 8.8* 8.8*  HCT 24.7* 26.7* 26.9*  PLT 72* 53* 54*   BMET Recent Labs    12/27/19 0609 12/28/19 0353 12/29/19 0444  NA 139 138 139  K 4.5 4.2 3.9  CL 106 106 107  CO2 _0 GLUCOSE 244* 150* 151*  BUN 78* 69* 64*  CREATININE 2.63* 2.10* 1.76*  CALCIUM 8.6* 8.6* 8.3*   LFT Recent Labs    12/29/19 0444  PROT 5.0*  ALBUMIN 2.5*  AST 158*  ALT 220*  ALKPHOS 291*  BILITOT 0.7   PT/INR Recent Labs    12/28/19 1634  LABPROT 12.8  INR 1.0    STUDIES: US Renal  Result Date: 12/27/2019 CLINICAL DATA:  59 year old female with acute renal failure. EXAM: RENAL / URINARY TRACT ULTRASOUND COMPLETE COMPARISON:  Renal ultrasound 12/10/2018. FINDINGS: Right Kidney: Renal measurements: 7.8 x 2.9 x 4.4 centimeters = volume: 53 mL (previously 84 mL). Echogenic right kidney (image 7) with no hydronephrosis or solid renal mass. Left Kidney: Renal measurements: 8.3 x 4.3 x 4.0 centimeters = volume: 76 mL (previously 69 mL). Left renal cortex echogenicity appears more normal. No left hydronephrosis or solid renal mass. Bladder: Abundant echogenic urinary debris within the bladder (image 28). Confluent debris depicted on image 34. Mild circumferential bladder wall thickening. Other: Abnormal appearance of the gallbladder with suggestion of pericholecystic fluid (image 15) and layering sludge. Gallbladder wall thickness seems to remain normal. IMPRESSION: 1. Abundant debris within the urinary bladder. Recommend correlation with urinalysis. 2. No acute renal findings. 3. Abnormal gallbladder with layering sludge and pericholecystic fluid. The appearance is nonspecific. If there is right upper quadrant abdominal pain recommend dedicated gallbladder ultrasound. Electronically Signed   By: Genevie Ann M.D.   On: 12/27/2019 14:01   NM Hepato  W/EjeCT Fract  Result Date: 12/29/2019 CLINICAL DATA:  Cholecystitis. EXAM: NUCLEAR MEDICINE HEPATOBILIARY IMAGING WITH GALLBLADDER EF TECHNIQUE: Sequential images of the abdomen were obtained out to 60 minutes following intravenous administration of radiopharmaceutical. After oral ingestion of Ensure, gallbladder ejection fraction was determined. RADIOPHARMACEUTICALS:  5.4 mCi Tc-7m Choletec IV COMPARISON:  Ultrasound 12/28/2019. FINDINGS: Prompt uptake and biliary excretion of activity by the liver is seen. Gallbladder activity is visualized, consistent with patency of cystic duct. Biliary activity passes into small bowel, consistent with patent common bile duct. Gallbladder ejection fraction was determined at 2 hours to ensure that measurement of the gallbladder was obtained. Calculated gallbladder ejection fraction is 67%. (Normal gallbladder ejection fraction with Ensure is greater than 33%.) IMPRESSION: Normal exam. Electronically Signed   By: TMarcello Moores Register   On: 12/29/2019 11:49   [place]Abdomen Limited RUQ  Result Date: 12/28/2019 CLINICAL DATA:  Abnormal appearance of the gallbladder on previous study. Transaminitis.  EXAM: ULTRASOUND ABDOMEN LIMITED RIGHT UPPER QUADRANT COMPARISON:  Renal ultrasound 12/27/2019 FINDINGS: Gallbladder: Gallbladder wall is diffusely thickened measuring up to 7 mm in thickness. There is prominent pericholecystic edema. Gallbladder is not dilated. There are no shadowing gallstones evident. No sonographic Murphy sign noted by sonographer. Common bile duct: Diameter: 5 mm Liver: Subtly nodular surface contour of the liver with coarsened hepatic echotexture. No focal lesion identified. Portal vein is patent on color Doppler imaging with normal direction of blood flow towards the liver. Other: None. IMPRESSION: 1. Heterogeneous appearance of the hepatic parenchyma with coarsened echotexture and a subtly nodular surface contour. Findings can be seen in the setting of cirrhosis as  well as hepatitis. 2. Diffuse gallbladder wall thickening with associated pericholecystic edema. Findings can be seen in the setting of acute cholecystitis, although are not specific. Notably, the sonographer reported a negative sonographic Murphy's sign. Other hepatic and systemic processes can also result in these findings including cirrhosis, hepatitis, and low protein states. If there is clinical suspicion for acute cholecystitis, a nuclear medicine hepatobiliary scan could be performed to evaluate patency of the cystic and common bile ducts. Electronically Signed   By: Davina Poke D.O.   On: 12/28/2019 09:09      Impression / Plan:   Jamey Demchak is a 59 y.o. y/o female with admission for DKA, with GI consulted for elevated liver enzymes which are chronic  With no right upper quadrant abdominal pain, normal CBD, normal bilirubin, this does not appear to be from bile duct obstruction  Chronically elevated liver enzymes likely due to underlying cirrhosis  However, patient will need complete work-up as she has not had it before, including autoimmune labs. Will order  Avoid hepatotoxic drugs. Pt advised to continue to abstain from alcohol or any OTC products  Follow up in GI clinic as an outpatient as well.   Thank you for involving me in the care of this patient.      LOS: 4 days   Virgel Manifold, MD  12/29/2019, 1:28 PM

## 2019-12-29 NOTE — Progress Notes (Addendum)
PROGRESS NOTE                                                                                                                                                                                                             Patient Demographics:    Susan Navarro, is a 59 y.o. female, DOB - 1961-02-13, EVO:[id number]  Admit date - 12/25/2019   Admitting Physician Wyvonnia Dusky, MD  Outpatient Primary MD for the patient is Rochel Brome, MD  LOS - 4    Chief Complaint  Patient presents with  . Altered Mental Status       Brief Narrative 59 year old female with uncontrolled type 1 diabetes mellitus (last A1c of 12.2) presented with DKA after missing her insulin dose since she was not feeling well.  Patient also had acute on chronic metabolic encephalopathy, acute on chronic kidney disease stage III and transaminitis.    Subjective:   CBG better controlled.  Denies any abdominal pain, nausea or vomiting.   Assessment  & Plan :    Active Problems:   DKA (diabetic ketoacidoses) ([place]) Uncontrolled type 1 diabetes mellitus.  Reportedly missed her insulin after not feeling well.  DKA resolved with IV fluids, IV insulin, now on Lantus and sliding scale coverage.  Added premeal aspart. Diabetic coordinator consult appreciated.  Active symptoms Acute kidney injury superimposed on chronic kidney disease stage IIIb Likely prerenal with dehydration and DKA.  Renal function continues improve with hydration.  Nephrology consult appreciated.  Avoid nephrotoxins.  Outpatient follow-up.  Acute transaminitis Has underlying chronic thrombocytopenia dating back to 2017 with elevated alkaline phosphatase and intermittently high transaminases which has never been addressed in the past.  Autoimmune work-up ordered..   Denies history of IV drug use or alcohol.  LFTs slowly improving.  Ultrasound abdomen showing cirrhotic changes.     Ultrasound abdomen shows diffuse gallbladder thickening with pericholecystic edema.  Cannot rule out acute cholecystitis although Percell Miller sign is negative.  HIDA scan negative. Discontinue statin.  INR normal.  Monitor LFTs closely. GI consult appreciated.  Recommends outpatient follow-up.  Malnutrition of moderate degree Continue supplement  Anemia of chronic kidney disease Hemoglobin currently stable.  epo as outpatient   History of stroke Hold statin given transaminitis.  Hypomagnesemia Replenished  Code Status : Full code  Family Communication  : None  Disposition  Plan  : Home Home in a.m. if LFTs continue to improve.  Barriers For Discharge : persistent transaminitis  Consults  : None  Procedures  : Renal ultrasound, abdominal ultrasound  DVT Prophylaxis  : SCDs  Lab Results  Component Value Date   PLT 54 (L) 12/29/2019    Antibiotics  :    Anti-infectives (From admission, onward)   None        Objective:   Vitals:   12/28/19 0936 12/28/19 1530 12/28/19 2209 12/28/19 2306  BP: 139/75 (!) 142/71 122/60 (!) 100/57  Pulse: 82 83 78 88  Resp:  18  16  Temp:  98.4 F (36.9 C)  98.9 F (37.2 C)  TempSrc:  Oral  Oral  SpO2:  100%  98%  Weight:      Height:        Wt Readings from Last 3 Encounters:  12/27/19 61.8 kg  10/08/19 64.9 kg  08/05/19 63 kg     Intake/Output Summary (Last 24 hours) at 12/29/2019 1354 Last data filed at 12/29/2019 0400 Gross per 24 hour  Intake 1593.96 ml  Output 300 ml  Net 1293.96 ml   Physical exam Not in distress HEENT: Pallor +, moist mucosa, supple Chest: Clear CVs: Normal S1-S2 GI: Soft, nondistended, nontender Musculoskeletal: Warm, no edema       Data Review:    CBC Recent Labs  Lab 12/25/19 1555 12/26/19 0413 12/27/19 0609 12/28/19 0353 12/29/19 0444  WBC 8.8 9.6 5.3 2.0* 2.7*  HGB 8.8* 8.3* 8.2* 8.8* 8.8*  HCT 28.1* 24.5* 24.7* 26.7* 26.9*  PLT 109* 100* 72* 53* 54*  MCV 96.2 89.1 90.8  92.4 92.4  MCH 30.1 30.2 30.1 30.4 30.2  MCHC 31.3 33.9 33.2 33.0 32.7  RDW 12.8 12.7 13.2 13.2 13.1  LYMPHSABS 0.5*  --   --   --   --   MONOABS 0.3  --   --   --   --   EOSABS 0.0  --   --   --   --   BASOSABS 0.0  --   --   --   --     Chemistries  Recent Labs  Lab 12/25/19 1555 12/25/19 2106 12/26/19 0413 12/26/19 1104 12/27/19 0609 12/28/19 0353 12/29/19 0444  NA 125*  --  134* 138 139 138 139  K 5.8*  --  4.1 4.2 4.5 4.2 3.9  CL 86*   < > 98 100 106 106 107  CO2 15*   < > 22 25 25 26 27   GLUCOSE 1,075*   < > 644* 167* 244* 150* 151*  BUN 68*   < > 78* 80* 78* 69* 64*  CREATININE 2.66*   < > 2.96* 3.06* 2.63* 2.10* 1.76*  CALCIUM 9.3   < > 9.0 9.2 8.6* 8.6* 8.3*  MG  --   --  2.0  --  1.9 1.8 1.5*  AST 20  --  23  --  61* 346* 158*  ALT 42  --  41  --  60* 280* 220*  ALKPHOS 313*  --  270*  --  221* 264* 291*  BILITOT 1.3*  --  0.6  --  0.9 1.0 0.7   < > = values in this interval not displayed.   ------------------------------------------------------------------------------------------------------------------ No results for input(s): CHOL, HDL, LDLCALC, TRIG, CHOLHDL, LDLDIRECT in the last 72 hours.  Lab Results  Component Value Date   HGBA1C 12.2 (H) 10/08/2019   ------------------------------------------------------------------------------------------------------------------ No results for  input(s): TSH, T4TOTAL, T3FREE, THYROIDAB in the last 72 hours.  Invalid input(s): FREET3 ------------------------------------------------------------------------------------------------------------------ No results for input(s): VITAMINB12, FOLATE, FERRITIN, TIBC, IRON, RETICCTPCT in the last 72 hours.  Coagulation profile Recent Labs  Lab 12/28/19 1634  INR 1.0    No results for input(s): DDIMER in the last 72 hours.  Cardiac Enzymes No results for input(s): CKMB, TROPONINI, MYOGLOBIN in the last 168 hours.  Invalid input(s):  CK ------------------------------------------------------------------------------------------------------------------ No results found for: BNP  Inpatient Medications  Scheduled Meds: . aspirin  81 mg Oral Daily  . enoxaparin (LOVENOX) injection  30 mg Subcutaneous Q24H  . insulin aspart  0-5 Units Subcutaneous QHS  . insulin aspart  0-9 Units Subcutaneous TID WC  . insulin aspart  5 Units Subcutaneous TID WC  . insulin glargine  10 Units Subcutaneous Q24H  . metoprolol tartrate  25 mg Oral BID  . Ensure Max Protein  11 oz Oral BID BM  . sodium chloride flush  3 mL Intravenous Q12H   Continuous Infusions: . sodium chloride 50 mL/hr at 12/28/19 2214  . magnesium sulfate bolus IVPB     PRN Meds:.ondansetron **OR** ondansetron (ZOFRAN) IV, senna-docusate, traMADol  Micro Results Recent Results (from the past 240 hour(s))  Urine Culture     Status: Abnormal   Collection Time: 12/26/19  4:13 AM   Specimen: Urine, Random  Result Value Ref Range Status   Specimen Description   Final    URINE, RANDOM Performed at [facility], [address], [place], [place] [address]    Special Requests   Final    NONE Performed at [facility], [address] [place], [place] [address]    Culture MULTIPLE SPECIES PRESENT, SUGGEST RECOLLECTION (A)  Final   Report Status 12/27/2019 FINAL  Final  SARS CORONAVIRUS 2 (TAT 6-24 HRS) Nasopharyngeal Nasopharyngeal Swab     Status: None   Collection Time: 12/26/19  5:15 PM   Specimen: Nasopharyngeal Swab  Result Value Ref Range Status   SARS Coronavirus 2 NEGATIVE NEGATIVE Final    Comment: (NOTE) SARS-CoV-2 target nucleic acids are NOT DETECTED. The SARS-CoV-2 RNA is generally detectable in upper and lower respiratory specimens during the acute phase of infection. Negative results do not preclude SARS-CoV-2 infection, do not rule out co-infections with other pathogens, and should not be used as the sole basis for treatment or  other patient management decisions. Negative results must be combined with clinical observations, patient history, and epidemiological information. The expected result is Negative. Fact Sheet for Patients: SugarRoll.be Fact Sheet for Healthcare Providers: https://www.woods-mathews.com/ This test is not yet approved or cleared by the [place] FDA and  has been authorized for detection and/or diagnosis of SARS-CoV-2 by FDA under an Emergency Use Authorization (EUA). This EUA will remain  in effect (meaning this test can be used) for the duration of the COVID-19 declaration under Section 56 4(b)(1) of the Act, 21 U.S.C. section 360bbb-3(b)(1), unless the authorization is terminated or revoked sooner. Performed at [facility], [place] [address] [place], [place] [address]     Radiology Reports CT Head Wo Contrast  Result Date: 12/25/2019 CLINICAL DATA:  Encephalopathy EXAM: CT HEAD WITHOUT CONTRAST TECHNIQUE: Contiguous axial images were obtained from the base of the skull through the vertex without intravenous contrast. COMPARISON:  None. FINDINGS: Brain: No definite evidence of acute infarction, hemorrhage, hydrocephalus, extra-axial collection or mass lesion/mass effect. There are multiple bilateral cerebral hypodensities, a large, encephalomalacic appearing hypodensity of the frontoparietal  right hemisphere (series 2, image 19), of the left frontal pole (series 2, image 70), and of the parietooccipital left hemisphere (series 2, image 16). Vascular: No hyperdense vessel or unexpected calcification. Skull: Normal. Negative for fracture or focal lesion. Sinuses/Orbits: No acute finding. Other: None. IMPRESSION: 1. Multiple bilateral cerebral hypodensities, a large, encephalomalacic appearing hypodensity of the frontoparietal right hemisphere, of the left frontal pole, and of the parietooccipital left hemisphere. Findings are most consistent with  multiple remote infarcts. 2. Consider MR examination of the brain to better evaluate for acute diffusion restricting infarction if suspected. Electronically Signed   By: Eddie Candle M.D.   On: 12/25/2019 17:46   MR BRAIN WO CONTRAST  Result Date: 12/25/2019 CLINICAL DATA:  Encephalopathy.  Multiple old infarctions by CT. EXAM: MRI HEAD WITHOUT CONTRAST TECHNIQUE: Multiplanar, multiecho pulse sequences of the brain and surrounding structures were obtained without intravenous contrast. COMPARISON:  Head CT same day FINDINGS: Brain: The patient would only allow diffusion imaging. Diffusion imaging does not show any acute or subacute infarction. As shown by CT, there are old small vessel cerebellar infarctions bilaterally, and old left occipital cortical infarction, old left parietal cortical infarction, old left frontal cortical infarction, and old right frontoparietal cortical infarction. Some dystrophic calcification associated with the right frontoparietal infarction. No sign of mass lesion, recent hemorrhage, hydrocephalus or extra-axial collection. Vascular: Major vessels at the base of the brain show flow. Skull and upper cervical spine: Negative Sinuses/Orbits: Clear/normal Other: None IMPRESSION: Patient allowed only diffusion imaging to be performed. Diffusion imaging does not show any acute or subacute infarction. Extensive old infarctions throughout the brain as outlined above. Electronically Signed   By: Nelson Chimes M.D.   On: 12/25/2019 19:34   US Renal  Result Date: 12/27/2019 CLINICAL DATA:  59 year old female with acute renal failure. EXAM: RENAL / URINARY TRACT ULTRASOUND COMPLETE COMPARISON:  Renal ultrasound 12/10/2018. FINDINGS: Right Kidney: Renal measurements: 7.8 x 2.9 x 4.4 centimeters = volume: 53 mL (previously 84 mL). Echogenic right kidney (image 7) with no hydronephrosis or solid renal mass. Left Kidney: Renal measurements: 8.3 x 4.3 x 4.0 centimeters = volume: 76 mL (previously 69  mL). Left renal cortex echogenicity appears more normal. No left hydronephrosis or solid renal mass. Bladder: Abundant echogenic urinary debris within the bladder (image 28). Confluent debris depicted on image 34. Mild circumferential bladder wall thickening. Other: Abnormal appearance of the gallbladder with suggestion of pericholecystic fluid (image 15) and layering sludge. Gallbladder wall thickness seems to remain normal. IMPRESSION: 1. Abundant debris within the urinary bladder. Recommend correlation with urinalysis. 2. No acute renal findings. 3. Abnormal gallbladder with layering sludge and pericholecystic fluid. The appearance is nonspecific. If there is right upper quadrant abdominal pain recommend dedicated gallbladder ultrasound. Electronically Signed   By: Genevie Ann M.D.   On: 12/27/2019 14:01   NM Hepato W/EjeCT Fract  Result Date: 12/29/2019 CLINICAL DATA:  Cholecystitis. EXAM: NUCLEAR MEDICINE HEPATOBILIARY IMAGING WITH GALLBLADDER EF TECHNIQUE: Sequential images of the abdomen were obtained out to 60 minutes following intravenous administration of radiopharmaceutical. After oral ingestion of Ensure, gallbladder ejection fraction was determined. RADIOPHARMACEUTICALS:  5.4 mCi Tc-60m Choletec IV COMPARISON:  Ultrasound 12/28/2019. FINDINGS: Prompt uptake and biliary excretion of activity by the liver is seen. Gallbladder activity is visualized, consistent with patency of cystic duct. Biliary activity passes into small bowel, consistent with patent common bile duct. Gallbladder ejection fraction was determined at 2 hours to ensure that measurement of the gallbladder was obtained.  Calculated gallbladder ejection fraction is 67%. (Normal gallbladder ejection fraction with Ensure is greater than 33%.) IMPRESSION: Normal exam. Electronically Signed   By: Marcello Moores  Register   On: 12/29/2019 11:49   US Abdomen Limited RUQ  Result Date: 12/28/2019 CLINICAL DATA:  Abnormal appearance of the gallbladder on  previous study. Transaminitis. EXAM: ULTRASOUND ABDOMEN LIMITED RIGHT UPPER QUADRANT COMPARISON:  Renal ultrasound 12/27/2019 FINDINGS: Gallbladder: Gallbladder wall is diffusely thickened measuring up to 7 mm in thickness. There is prominent pericholecystic edema. Gallbladder is not dilated. There are no shadowing gallstones evident. No sonographic Murphy sign noted by sonographer. Common bile duct: Diameter: 5 mm Liver: Subtly nodular surface contour of the liver with coarsened hepatic echotexture. No focal lesion identified. Portal vein is patent on color Doppler imaging with normal direction of blood flow towards the liver. Other: None. IMPRESSION: 1. Heterogeneous appearance of the hepatic parenchyma with coarsened echotexture and a subtly nodular surface contour. Findings can be seen in the setting of cirrhosis as well as hepatitis. 2. Diffuse gallbladder wall thickening with associated pericholecystic edema. Findings can be seen in the setting of acute cholecystitis, although are not specific. Notably, the sonographer reported a negative sonographic Murphy's sign. Other hepatic and systemic processes can also result in these findings including cirrhosis, hepatitis, and low protein states. If there is clinical suspicion for acute cholecystitis, a nuclear medicine hepatobiliary scan could be performed to evaluate patency of the cystic and common bile ducts. Electronically Signed   By: Davina Poke D.O.   On: 12/28/2019 09:09    Time Spent in minutes  35   Dyesha Henault M.D on 12/29/2019 at 1:54 PM  Between 7am to 7pm - Pager - [phone]  After 7pm go to [email or web address] - password [facility]  [facility] -  Office  [phone]

## 2019-12-29 NOTE — Progress Notes (Signed)
Inpatient Diabetes Program Recommendations  AACE/ADA: New Consensus Statement on Inpatient Glycemic Control (2015)  Target Ranges:  Prepandial:   less than 140 mg/dL      Peak postprandial:   less than 180 mg/dL (1-2 hours)      Critically ill patients:  140 - 180 mg/dL   Lab Results  Component Value Date   GLUCAP 170 (H) 12/29/2019   HGBA1C 12.2 (H) 10/08/2019    Review of Glycemic Control Results for Susan Navarro, Susan Navarro (MRN [id number]) as of 12/29/2019 10:51  Ref. Range 12/28/2019 09:23 12/28/2019 11:46 12/28/2019 17:40 12/28/2019 21:20 12/29/2019 07:42  Glucose-Capillary Latest Ref Range: 70 - 99 mg/dL 207 (H) 318 (H) 391 (H) 277 (H) 170 (H)    Diabetes history: DM1 Outpatient Diabetes medications: Lantus 10 units daily; Humalog 10 units TID with meals Current orders for Inpatient glycemic control:  Lantus 10 units daily; Novolog 0-9 units TID with meals + 0-5 QHS; Novolog 2 units TID with meals  Inpatient Diabetes Program Recommendations:     -Please consider Novolog 6 units TID with meals if eats at least 50% of meal  Thank you, Geoffry Paradise, RN, BSN Diabetes Coordinator Inpatient Diabetes Program [phone] (team pager from 8a-5p)

## 2019-12-29 NOTE — Progress Notes (Signed)
OT Cancellation Note  Patient Details Name: Susan Navarro MRN: [id number] DOB: [birth year]   Cancelled Treatment:    Reason Eval/Treat Not Completed: Patient at procedure or test/ unavailable. Pt out of room upon attempt. Will re-attempt OT tx at later date/time as pt is available and medically appropriate.   Jeni Salles, MPH, MS, OTR/L ascom [phone] 12/29/19, 11:18 AM

## 2019-12-30 DIAGNOSIS — N184 Chronic kidney disease, stage 4 (severe): Secondary | ICD-10-CM

## 2019-12-30 DIAGNOSIS — IMO0002 Reserved for concepts with insufficient information to code with codable children: Secondary | ICD-10-CM | POA: Diagnosis present

## 2019-12-30 DIAGNOSIS — D696 Thrombocytopenia, unspecified: Secondary | ICD-10-CM | POA: Diagnosis present

## 2019-12-30 DIAGNOSIS — K7469 Other cirrhosis of liver: Secondary | ICD-10-CM | POA: Diagnosis present

## 2019-12-30 DIAGNOSIS — N178 Other acute kidney failure: Secondary | ICD-10-CM

## 2019-12-30 DIAGNOSIS — R7401 Elevation of levels of liver transaminase levels: Secondary | ICD-10-CM | POA: Diagnosis present

## 2019-12-30 DIAGNOSIS — E1065 Type 1 diabetes mellitus with hyperglycemia: Secondary | ICD-10-CM

## 2019-12-30 DIAGNOSIS — N179 Acute kidney failure, unspecified: Secondary | ICD-10-CM | POA: Diagnosis present

## 2019-12-30 DIAGNOSIS — E1029 Type 1 diabetes mellitus with other diabetic kidney complication: Secondary | ICD-10-CM

## 2019-12-30 LAB — COMPREHENSIVE METABOLIC PANEL
ALT: 165 U/L — ABNORMAL HIGH (ref 0–44)
AST: 77 U/L — ABNORMAL HIGH (ref 15–41)
Albumin: 2.4 g/dL — ABNORMAL LOW (ref 3.5–5.0)
Alkaline Phosphatase: 287 U/L — ABNORMAL HIGH (ref 38–126)
Anion gap: 8 (ref 5–15)
BUN: 58 mg/dL — ABNORMAL HIGH (ref 6–20)
CO2: 25 mmol/L (ref 22–32)
Calcium: 8.3 mg/dL — ABNORMAL LOW (ref 8.9–10.3)
Chloride: 105 mmol/L (ref 98–111)
Creatinine, Ser: 1.73 mg/dL — ABNORMAL HIGH (ref 0.44–1.00)
GFR calc Af Amer: 37 mL/min — ABNORMAL LOW (ref >60)
GFR calc non Af Amer: 32 mL/min — ABNORMAL LOW (ref >60)
Glucose, Bld: 270 mg/dL — ABNORMAL HIGH (ref 70–99)
Potassium: 4 mmol/L (ref 3.5–5.1)
Sodium: 138 mmol/L (ref 135–145)
Total Bilirubin: 0.8 mg/dL (ref 0.3–1.2)
Total Protein: 5 g/dL — ABNORMAL LOW (ref 6.5–8.1)

## 2019-12-30 LAB — IGG, IGA, IGM
IgA: 472 mg/dL — ABNORMAL HIGH (ref 87–352)
IgG (Immunoglobin G), Serum: 1005 mg/dL (ref 586–1602)
IgM (Immunoglobulin M), Srm: 54 mg/dL (ref 26–217)

## 2019-12-30 LAB — CERULOPLASMIN: Ceruloplasmin: 27 mg/dL (ref 19.0–39.0)

## 2019-12-30 LAB — PHOSPHORUS: Phosphorus: 2.7 mg/dL (ref 2.5–4.6)

## 2019-12-30 LAB — CBC
HCT: 27.5 % — ABNORMAL LOW (ref 36.0–46.0)
Hemoglobin: 9 g/dL — ABNORMAL LOW (ref 12.0–15.0)
MCH: 30.3 pg (ref 26.0–34.0)
MCHC: 32.7 g/dL (ref 30.0–36.0)
MCV: 92.6 fL (ref 80.0–100.0)
Platelets: 56 10*3/uL — ABNORMAL LOW (ref 150–400)
RBC: 2.97 MIL/uL — ABNORMAL LOW (ref 3.87–5.11)
RDW: 13.3 % (ref 11.5–15.5)
WBC: 3.2 10*3/uL — ABNORMAL LOW (ref 4.0–10.5)
nRBC: 0 % (ref 0.0–0.2)

## 2019-12-30 LAB — GLUCOSE, CAPILLARY
Glucose-Capillary: 362 mg/dL — ABNORMAL HIGH (ref 70–99)
Glucose-Capillary: 255 mg/dL — ABNORMAL HIGH (ref 70–99)

## 2019-12-30 LAB — ANTI-SMOOTH MUSCLE ANTIBODY, IGG: F-Actin IgG: 15 Units (ref 0–19)

## 2019-12-30 LAB — MAGNESIUM: Magnesium: 2 mg/dL (ref 1.7–2.4)

## 2019-12-30 LAB — MITOCHONDRIAL ANTIBODIES: Mitochondrial M2 Ab, IgG: 37.9 Units — ABNORMAL HIGH (ref 0.0–20.0)

## 2019-12-30 LAB — ANTI-MICROSOMAL ANTIBODY LIVER / KIDNEY: LKM1 Ab: 0.8 Units (ref 0.0–20.0)

## 2019-12-30 MED ORDER — INSULIN GLARGINE 100 UNIT/ML ~~LOC~~ SOLN
15.0000 [IU] | SUBCUTANEOUS | Status: DC
  Administered 2019-12-30: 15 [IU] via SUBCUTANEOUS
  Filled 2019-12-30 (×2): qty 0.15

## 2019-12-30 MED ORDER — INSULIN ASPART 100 UNIT/ML ~~LOC~~ SOLN
8.0000 [IU] | Freq: Three times a day (TID) | SUBCUTANEOUS | Status: DC
  Administered 2019-12-30 (×2): 8 [IU] via SUBCUTANEOUS
  Filled 2019-12-30 (×2): qty 1

## 2019-12-30 MED ORDER — LANTUS SOLOSTAR 100 UNIT/ML ~~LOC~~ SOPN: 20.0000 [IU] | PEN_INJECTOR | Freq: Every day | SUBCUTANEOUS | 11 refills | Status: DC

## 2019-12-30 MED ORDER — ENSURE MAX PROTEIN PO LIQD: 11.0000 [oz_av] | Freq: Two times a day (BID) | ORAL | 12 refills | Status: AC

## 2019-12-30 NOTE — TOC Transition Note (Signed)
Transition of Care [facility]) - CM/SW Discharge Note   Patient Details  Name: Chrisann Melaragno MRN: [id number] Date of Birth: 08/30/1961  Transition of [facility]) CM/SW Contact:  Su Hilt, RN Phone Number: 12/30/2019, 11:52 AM   Clinical Narrative:     Patient refuses [place] services, She was provided  a3 in 1 and a RW by [employer] and it is in the room for her to take with her home NO additional needs Final next level of care: Home/Self Care Barriers to Discharge: Barriers Resolved   Patient Goals and CMS Choice Patient states their goals for this hospitalization and ongoing recovery are:: to get home      Discharge Placement                       Discharge Plan and Services   Discharge Planning Services: CM Consult            DME Arranged: 3-N-1, [place] rolling DME Agency: [employer] Date DME Agency Contacted: 12/27/19 Time DME Agency Contacted: 1209 Representative spoke with at DME Agency: [place] Arranged: Refused [place]          Social Determinants of Health ([place]) Interventions     Readmission Risk Interventions Readmission Risk Prevention Plan 12/28/2019  Transportation Screening Complete  HRI or [place] Complete  Palliative Care Screening Not Applicable  Medication Review (RN Care Manager) Referral to Pharmacy  Some recent data might be hidden

## 2019-12-30 NOTE — Progress Notes (Signed)
[facility]  ROUNDING NOTE   Subjective:  Patient appears to be in good spirits today. Creatinine appears to have stabilized at 1.7. Gastroenterology has also evaluated the patient.   Objective:  Vital signs in last 24 hours:  Temp:  [98 F (36.7 C)-98.4 F (36.9 C)] 98 F (36.7 C) (01/08 0743) Pulse Rate:  [86-87] 86 (01/08 0743) Resp:  [16] 16 (01/08 0743) BP: (116-140)/(58-79) 140/79 (01/08 0743) SpO2:  [99 %-100 %] 100 % (01/08 0743)  Weight change:  Filed Weights   12/25/19 1606 12/26/19 0733 12/27/19 1540  Weight: 65.8 kg 65.8 kg 61.8 kg    Intake/Output: I/O last 3 completed shifts: In: 1567.8 [P.O.:200; I.V.:1334.1; IV Piggyback:33.7] Out: -    Intake/Output this shift:  No intake/output data recorded.  Physical Exam: General: No acute distress  Head: Normocephalic, atraumatic. Moist oral mucosal membranes  Eyes: Anicteric  Neck: Supple, trachea midline  Lungs:  Clear to auscultation, normal effort  Heart: S1S2 no rubs  Abdomen:  Soft, nontender, bowel sounds present  Extremities: No peripheral edema.  Neurologic: Awake, alert, following commands  Skin: No lesions       Basic Metabolic Panel: Recent Labs  Lab 12/26/19 0413 12/26/19 1104 12/27/19 0609 12/28/19 0353 12/29/19 0444 12/30/19 0316  NA 134* 138 139 138 139 138  K 4.1 4.2 4.5 4.2 3.9 4.0  CL 98 100 106 106 107 105  CO2 22 25 25 26 27 25   GLUCOSE 644* 167* 244* 150* 151* 270*  BUN 78* 80* 78* 69* 64* 58*  CREATININE 2.96* 3.06* 2.63* 2.10* 1.76* 1.73*  CALCIUM 9.0 9.2 8.6* 8.6* 8.3* 8.3*  MG 2.0  --  1.9 1.8 1.5* 2.0  PHOS 2.8  --  2.5 2.8 3.2 2.7    Liver Function Tests: Recent Labs  Lab 12/26/19 0413 12/27/19 0609 12/28/19 0353 12/29/19 0444 12/30/19 0316  AST 23 61* 346* 158* 77*  ALT 41 60* 280* 220* 165*  ALKPHOS 270* 221* 264* 291* 287*  BILITOT 0.6 0.9 1.0 0.7 0.8  PROT 6.0* 5.3* 5.5* 5.0* 5.0*  ALBUMIN 3.4* 2.9* 2.9* 2.5* 2.4*   No results for  input(s): LIPASE, AMYLASE in the last 168 hours. No results for input(s): AMMONIA in the last 168 hours.  CBC: Recent Labs  Lab 12/25/19 1555 12/26/19 0413 12/27/19 0609 12/28/19 0353 12/29/19 0444 12/30/19 0316  WBC 8.8 9.6 5.3 2.0* 2.7* 3.2*  NEUTROABS 8.0*  --   --   --   --   --   HGB 8.8* 8.3* 8.2* 8.8* 8.8* 9.0*  HCT 28.1* 24.5* 24.7* 26.7* 26.9* 27.5*  MCV 96.2 89.1 90.8 92.4 92.4 92.6  PLT 109* 100* 72* 53* 54* 56*    Cardiac Enzymes: No results for input(s): CKTOTAL, CKMB, CKMBINDEX, TROPONINI in the last 168 hours.  BNP: Invalid input(s): POCBNP  CBG: Recent Labs  Lab 12/29/19 1144 12/29/19 1701 12/29/19 2142 12/30/19 0740 12/30/19 1214  GLUCAP 313* 425* 252* 362* 255*    Microbiology: Results for orders placed or performed during the hospital encounter of 12/25/19  Urine Culture     Status: Abnormal   Collection Time: 12/26/19  4:13 AM   Specimen: Urine, Random  Result Value Ref Range Status   Specimen Description   Final    URINE, RANDOM Performed at [facility], [address], [place], [place] [address]    Special Requests   Final    NONE Performed at [facility], [address] [place],  [place] [address]    Culture MULTIPLE SPECIES PRESENT, SUGGEST RECOLLECTION (A)  Final   Report Status 12/27/2019 FINAL  Final  SARS CORONAVIRUS 2 (TAT 6-24 HRS) Nasopharyngeal Nasopharyngeal Swab     Status: None   Collection Time: 12/26/19  5:15 PM   Specimen: Nasopharyngeal Swab  Result Value Ref Range Status   SARS Coronavirus 2 NEGATIVE NEGATIVE Final    Comment: (NOTE) SARS-CoV-2 target nucleic acids are NOT DETECTED. The SARS-CoV-2 RNA is generally detectable in upper and lower respiratory specimens during the acute phase of infection. Negative results do not preclude SARS-CoV-2 infection, do not rule out co-infections with other pathogens, and should not be used as the sole basis for treatment or other patient management  decisions. Negative results must be combined with clinical observations, patient history, and epidemiological information. The expected result is Negative. Fact Sheet for Patients: SugarRoll.be Fact Sheet for Healthcare Providers: https://www.woods-mathews.com/ This test is not yet approved or cleared by the [place] FDA and  has been authorized for detection and/or diagnosis of SARS-CoV-2 by FDA under an Emergency Use Authorization (EUA). This EUA will remain  in effect (meaning this test can be used) for the duration of the COVID-19 declaration under Section 56 4(b)(1) of the Act, 21 U.S.C. section 360bbb-3(b)(1), unless the authorization is terminated or revoked sooner. Performed at [facility], [place] [address] [place], [place] [address]     Coagulation Studies: Recent Labs    12/28/19 1634  LABPROT 12.8  INR 1.0    Urinalysis: No results for input(s): COLORURINE, LABSPEC, PHURINE, GLUCOSEU, HGBUR, BILIRUBINUR, KETONESUR, PROTEINUR, UROBILINOGEN, NITRITE, LEUKOCYTESUR in the last 72 hours.  Invalid input(s): APPERANCEUR    Imaging: NM Hepato W/EjeCT Fract  Result Date: 12/29/2019 CLINICAL DATA:  Cholecystitis. EXAM: NUCLEAR MEDICINE HEPATOBILIARY IMAGING WITH GALLBLADDER EF TECHNIQUE: Sequential images of the abdomen were obtained out to 60 minutes following intravenous administration of radiopharmaceutical. After oral ingestion of Ensure, gallbladder ejection fraction was determined. RADIOPHARMACEUTICALS:  5.4 mCi Tc-61m Choletec IV COMPARISON:  Ultrasound 12/28/2019. FINDINGS: Prompt uptake and biliary excretion of activity by the liver is seen. Gallbladder activity is visualized, consistent with patency of cystic duct. Biliary activity passes into small bowel, consistent with patent common bile duct. Gallbladder ejection fraction was determined at 2 hours to ensure that measurement of the gallbladder was obtained.  Calculated gallbladder ejection fraction is 67%. (Normal gallbladder ejection fraction with Ensure is greater than 33%.) IMPRESSION: Normal exam. Electronically Signed   By: T[place]  On: 12/29/2019 11:49     Medications:   . sodium chloride 50 mL/hr at 12/30/19 0300   . aspirin  81 mg Oral Daily  . enoxaparin (LOVENOX) injection  30 mg Subcutaneous Q24H  . insulin aspart  0-5 Units Subcutaneous QHS  . insulin aspart  0-9 Units Subcutaneous TID WC  . insulin aspart  8 Units Subcutaneous TID WC  . insulin glargine  15 Units Subcutaneous Q24H  . metoprolol tartrate  25 mg Oral BID  . Ensure Max Protein  11 oz Oral BID BM  . sodium chloride flush  3 mL Intravenous Q12H   ondansetron **OR** ondansetron (ZOFRAN) IV, senna-docusate, traMADol  Assessment/ Plan:  59y.o. female with a PMHx of longstanding diabetes mellitus type 1, hypertension, history of CVA, chronic kidney disease stage IIIb EGFR 32, [facility] of nausea and vomiting.   1.  Acute kidney injury/chronic kidney disease stage IIIb baseline EGFR 32/diabetes  mellitus type 1 with chronic kidney disease.  Acute kidney injury secondary to nausea, vomiting, and severe hyperglycemia upon admission.  Both kidneys noted to be atrophic on renal ultrasound which is consistent with chronic kidney disease. -Creatinine appears to have stabilized at 1.7 with an EGFR of 37 which is actually better than her EGFR as an outpatient.  Advised the patient that she will need continued monitoring of her renal function as an outpatient.  Okay to stop IV fluid hydration.  We will set up follow-up in our office for her underlying chronic kidney disease.  2.  Anemia of chronic kidney disease.  We plan to repeat CBC as an outpatient.  Consider hematology referral for Epogen and iron as an outpatient.   LOS: 5 Susan Navarro 1/8/20211:18 PM

## 2019-12-30 NOTE — Discharge Instructions (Signed)
= Cirrhosis  Cirrhosis is long-term (chronic) liver injury. The liver is the body's largest internal organ, and it performs many functions. It converts food into energy, removes toxic material from the blood, makes important proteins, and absorbs necessary vitamins from food. In cirrhosis, healthy liver cells are replaced by scar tissue. This prevents blood from flowing through the liver, making it difficult for the liver to function. Scarring of the liver cannot be reversed, but treatment can prevent it from getting worse. What are the causes? Common causes of this condition are hepatitis C and long-term alcohol abuse. Other causes include:  Nonalcoholic fatty liver disease. This happens when fat is deposited in the liver by causes other than alcohol.  Hepatitis B infection.  Autoimmune hepatitis. In this condition, the body's defense system (immune system) mistakenly attacks the liver cells, causing irritation and swelling (inflammation).  Diseases that cause blockage of ducts inside the liver.  Inherited liver diseases, such as hemochromatosis. This is one of the most common inherited liver diseases. In this disease, deposits of iron collect in the liver and other organs.  Reactions to certain long-term medicines, such as amiodarone, a heart medicine.  Parasitic infections. These include schistosomiasis, which is caused by a flatworm.  Long-term contact to certain toxins. These toxins include certain organic solvents, such as toluene and chloroform. What increases the risk? You are more likely to develop this condition if:  You have certain types of viral hepatitis.  You abuse alcohol, especially if you are female.  You are overweight.  You share needles.  You have unprotected sex with someone who has viral hepatitis. What are the signs or symptoms? You may not have any signs and symptoms at first. Symptoms may not develop until the damage to your liver starts to get  worse. Early symptoms may include:  Weakness and tiredness (fatigue).  Changes in sleep patterns or having trouble sleeping.  Itchiness.  Tenderness in the right-upper part of your abdomen.  Weight loss and muscle loss.  Nausea.  Loss of appetite.  Appearance of tiny blood vessels under the skin. Later symptoms may include:  Fatigue or weakness that is getting worse.  Yellow skin and eyes (jaundice).  Buildup of fluid in the abdomen (ascites). You may notice that your clothes are tight around your waist.  Weight gain.  Swelling of the feet and ankles (edema).  Trouble breathing.  Easy bruising and bleeding.  Vomiting blood.  Black or bloody stool.  Mental confusion. How is this diagnosed? Your health care provider may suspect cirrhosis based on your symptoms and medical history, especially if you have other medical conditions or a history of alcohol abuse. Your health care provider will do a physical exam to feel your liver and to check for signs of cirrhosis. He or she may perform other tests, including:  Blood tests to check: ? For hepatitis B or C. ? Kidney function. ? Liver function.  Imaging tests such as: ? MRI or CT scan to look for changes seen in advanced cirrhosis. ? Ultrasound to see if normal liver tissue is being replaced by scar tissue.  A procedure in which a long needle is used to take a sample of liver tissue to be checked in a lab (biopsy). Liver biopsy can confirm the diagnosis of cirrhosis. How is this treated? Treatment for this condition depends on how damaged your liver is and what caused the damage. It may include treating the symptoms of cirrhosis, or treating the underlying causes in order  to slow the damage. Treatment may include:  Making lifestyle changes, such as: ? Eating a healthy diet. You may need to work with your health care provider or a diet and nutrition specialist (dietitian) to develop an eating plan. ? Restricting salt  intake. ? Maintaining a healthy weight. ? Not abusing drugs or alcohol.  Taking medicines to: ? Treat liver infections or other infections. ? Control itching. ? Reduce fluid buildup. ? Reduce certain blood toxins. ? Reduce risk of bleeding from enlarged blood vessels in the stomach or esophagus (varices).  Liver transplant. In this procedure, a liver from a donor is used to replace your diseased liver. This is done if cirrhosis has caused liver failure. Other treatments and procedures may be done depending on the problems that you get from cirrhosis. Common problems include liver-related kidney failure (hepatorenal syndrome). Follow these instructions at home:   Take medicines only as told by your health care provider. Do not use medicines that are toxic to your liver. Ask your health care provider before taking any new medicines, including over-the-counter medicines.  Rest as needed.  Eat a well-balanced diet. Ask your health care provider or dietitian for more information.  Limit your salt or water intake, if your health care provider asks you to do this.  Do not drink alcohol. This is especially important if you are taking acetaminophen.  Keep all follow-up visits as told by your health care provider. This is important. Contact a health care provider if you:  Have fatigue or weakness that is getting worse.  Develop swelling of the hands, feet, legs, or face.  Have a fever.  Develop loss of appetite.  Have nausea or vomiting.  Develop jaundice.  Develop easy bruising or bleeding. Get help right away if you:  Vomit bright red blood or a material that looks like coffee grounds.  Have blood in your stools.  Notice that your stools appear black and tarry.  Become confused.  Have chest pain or trouble breathing. Summary  Cirrhosis is chronic liver injury. Liver damage cannot be reversed. Common causes are hepatitis C and long-term alcohol abuse.  Tests used to  diagnose cirrhosis include blood tests, imaging tests, and liver biopsy.  Treatment for this condition involves treating the underlying cause. Avoid alcohol, drugs, salt, and medicines that may damage your liver.  Contact your health care provider if you develop ascites, edema, jaundice, fever, nausea or vomiting, easy bruising or bleeding, or worsening fatigue. This information is not intended to replace advice given to you by your health care provider. Make sure you discuss any questions you have with your health care provider. Document Revised: 03/30/2019 Document Reviewed: 10/28/2017 Elsevier Patient Education  [place].   Diabetes Mellitus and Nutrition, Adult When you have diabetes (diabetes mellitus), it is very important to have healthy eating habits because your blood sugar (glucose) levels are greatly affected by what you eat and drink. Eating healthy foods in the appropriate amounts, at about the same times every day, can help you:  Control your blood glucose.  Lower your risk of heart disease.  Improve your blood pressure.  Reach or maintain a healthy weight. Every person with diabetes is different, and each person has different needs for a meal plan. Your health care provider may recommend that you work with a diet and nutrition specialist (dietitian) to make a meal plan that is best for you. Your meal plan may vary depending on factors such as:  The calories you need.  The medicines you take.  Your weight.  Your blood glucose, blood pressure, and cholesterol levels.  Your activity level.  Other health conditions you have, such as heart or kidney disease. How do carbohydrates affect me? Carbohydrates, also called carbs, affect your blood glucose level more than any other type of food. Eating carbs naturally raises the amount of glucose in your blood. Carb counting is a method for keeping track of how many carbs you eat. Counting carbs is important to keep your  blood glucose at a healthy level, especially if you use insulin or take certain oral diabetes medicines. It is important to know how many carbs you can safely have in each meal. This is different for every person. Your dietitian can help you calculate how many carbs you should have at each meal and for each snack. Foods that contain carbs include:  Bread, cereal, rice, pasta, and crackers.  Potatoes and corn.  Peas, beans, and lentils.  Milk and yogurt.  Fruit and juice.  Desserts, such as cakes, cookies, ice cream, and candy. How does alcohol affect me? Alcohol can cause a sudden decrease in blood glucose (hypoglycemia), especially if you use insulin or take certain oral diabetes medicines. Hypoglycemia can be a life-threatening condition. Symptoms of hypoglycemia (sleepiness, dizziness, and confusion) are similar to symptoms of having too much alcohol. If your health care provider says that alcohol is safe for you, follow these guidelines:  Limit alcohol intake to no more than 1 drink per day for nonpregnant women and 2 drinks per day for men. One drink equals 12 oz of beer, 5 oz of wine, or 1 oz of hard liquor.  Do not drink on an empty stomach.  Keep yourself hydrated with water, diet soda, or unsweetened iced tea.  Keep in mind that regular soda, juice, and other mixers may contain a lot of sugar and must be counted as carbs. What are tips for following this plan?  Reading food labels  Start by checking the serving size on the "Nutrition Facts" label of packaged foods and drinks. The amount of calories, carbs, fats, and other nutrients listed on the label is based on one serving of the item. Many items contain more than one serving per package.  Check the total grams (g) of carbs in one serving. You can calculate the number of servings of carbs in one serving by dividing the total carbs by 15. For example, if a food has 30 g of total carbs, it would be equal to 2 servings of  carbs.  Check the number of grams (g) of saturated and trans fats in one serving. Choose foods that have low or no amount of these fats.  Check the number of milligrams (mg) of salt (sodium) in one serving. Most people should limit total sodium intake to less than 2,300 mg per day.  Always check the nutrition information of foods labeled as "low-fat" or "nonfat". These foods may be higher in added sugar or refined carbs and should be avoided.  Talk to your dietitian to identify your daily goals for nutrients listed on the label. Shopping  Avoid buying canned, premade, or processed foods. These foods tend to be high in fat, sodium, and added sugar.  Shop around the outside edge of the grocery store. This includes fresh fruits and vegetables, bulk grains, fresh meats, and fresh dairy. Cooking  Use low-heat cooking methods, such as baking, instead of high-heat cooking methods like deep frying.  Cook using healthy oils, such  as olive, canola, or sunflower oil.  Avoid cooking with butter, cream, or high-fat meats. Meal planning  Eat meals and snacks regularly, preferably at the same times every day. Avoid going long periods of time without eating.  Eat foods high in fiber, such as fresh fruits, vegetables, beans, and whole grains. Talk to your dietitian about how many servings of carbs you can eat at each meal.  Eat 4-6 ounces (oz) of lean protein each day, such as lean meat, chicken, fish, eggs, or tofu. One oz of lean protein is equal to: ? 1 oz of meat, chicken, or fish. ? 1 egg. ?  cup of tofu.  Eat some foods each day that contain healthy fats, such as avocado, nuts, seeds, and fish. Lifestyle  Check your blood glucose regularly.  Exercise regularly as told by your health care provider. This may include: ? 150 minutes of moderate-intensity or vigorous-intensity exercise each week. This could be brisk walking, biking, or water aerobics. ? Stretching and doing strength  exercises, such as yoga or weightlifting, at least 2 times a week.  Take medicines as told by your health care provider.  Do not use any products that contain nicotine or tobacco, such as cigarettes and e-cigarettes. If you need help quitting, ask your health care provider.  Work with a Social worker or diabetes educator to identify strategies to manage stress and any emotional and social challenges. Questions to ask a health care provider  Do I need to meet with a diabetes educator?  Do I need to meet with a dietitian?  What number can I call if I have questions?  When are the best times to check my blood glucose? Where to find more information:  American Diabetes Association: diabetes.org  Academy of Nutrition and Dietetics: www.eatright.[employer] of Diabetes and Digestive and Kidney Diseases (NIH): [employer] Summary  A healthy meal plan will help you control your blood glucose and maintain a healthy lifestyle.  Working with a diet and nutrition specialist (dietitian) can help you make a meal plan that is best for you.  Keep in mind that carbohydrates (carbs) and alcohol have immediate effects on your blood glucose levels. It is important to count carbs and to use alcohol carefully. This information is not intended to replace advice given to you by your health care provider. Make sure you discuss any questions you have with your health care provider. Document Revised: 11/20/2017 Document Reviewed: 01/12/2017 Elsevier Patient Education  2020 [employer].

## 2019-12-30 NOTE — Care Management Important Message (Signed)
Important Message  Patient Details  Name: Susan Navarro MRN: [id number] Date of Birth: 04/30/1961   Medicare Important Message Given:  Yes     Juliann Pulse A Elisea Khader 12/30/2019, 11:01 AM

## 2019-12-30 NOTE — Progress Notes (Signed)
Physical Therapy Treatment Patient Details Name: Susan Navarro MRN: [id number] DOB: May 29, 1961 Today's Date: 12/30/2019    History of Present Illness Pt is 59 y/o F with PMH DM1-poorly controlled, CKD IV, Thrombocytopenia, HLD, and HTN. Pt adm for DKA and acute metabolic encephalopathy.    PT Comments    Pt was able to get out of bed and easily ambulate around the nurses station with appropriate speed, safety and no use of AD.  She showed no overt safety issues, good confidence and reports being essentially at her baseline.  Pt feels good about being able to go home and is eager to do so.     Follow Up Recommendations  No PT follow up     Equipment Recommendations  None recommended by PT    Recommendations for Other Services       Precautions / Restrictions Precautions Precautions: Fall Restrictions Weight Bearing Restrictions: No    Mobility  Bed Mobility Overal bed mobility: Independent             General bed mobility comments: easily gets to sitting EOB  Transfers Overall transfer level: Independent Equipment used: None             General transfer comment: easily gets to standing w/o assist  Ambulation/Gait Ambulation/Gait assistance: Modified independent (Device/Increase time) Gait Distance (Feet): 250 Feet Assistive device: None       General Gait Details: Pt was able to do 2 loops w/o AD, w/o CGA and with good confidence and community appropriate speed.  Her O2 remained in the high 90s and her HR stayed stable in the 90s pre, during and post ambulation.    Stairs             Wheelchair Mobility    Modified Rankin (Stroke Patients Only)       Balance Overall balance assessment: Independent                                          Cognition Arousal/Alertness: Awake/alert                                            Exercises General Exercises - Lower Extremity Ankle Circles/Pumps: AROM;10  reps Long Arc Quad: Strengthening;10 reps Heel Slides: Strengthening;10 reps Hip ABduction/ADduction: Strengthening;10 reps Hip Flexion/Marching: Strengthening;10 reps    General Comments        Pertinent Vitals/Pain Pain Assessment: No/denies pain    Home Living                      Prior Function            PT Goals (current goals can now be found in the care plan section) Progress towards PT goals: Progressing toward goals    Frequency    Min 2X/week      PT Plan Current plan remains appropriate    Co-evaluation              AM-PAC PT "6 Clicks" Mobility   Outcome Measure  Help needed turning from your back to your side while in a flat bed without using bedrails?: None Help needed moving from lying on your back to sitting on the side of a flat bed without using bedrails?: None Help needed  moving to and from a bed to a chair (including a wheelchair)?: None Help needed standing up from a chair using your arms (e.g., wheelchair or bedside chair)?: None Help needed to walk in hospital room?: None Help needed climbing 3-5 steps with a railing? : None 6 Click Score: 24    End of Session Equipment Utilized During Treatment: Gait belt Activity Tolerance: Patient tolerated treatment well Patient left: with call bell/phone within reach;with chair alarm set Nurse Communication: Mobility status PT Visit Diagnosis: Muscle weakness (generalized) (M62.81)     Time: 1287-8676 PT Time Calculation (min) (ACUTE ONLY): 25 min  Charges:  $Gait Training: 8-22 mins $Therapeutic Exercise: 8-22 mins                     Kreg Shropshire, DPT 12/30/2019, 11:00 AM

## 2019-12-30 NOTE — Discharge Summary (Signed)
Physician Discharge Summary  Susan Navarro VVZ:[id number] DOB: 1961/07/11 DOA: 12/25/2019  PCP: Rochel Brome, MD  Admit date: 12/25/2019 Discharge date: 12/30/2019  Admitted From: Home Disposition: Home Recommendations for Outpatient Follow-up:  1. Follow up with PCP in 1-2 weeks.  Please check CBC and LFTs during outpatient visit. 2. 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  Patient also had acute on chronic metabolic encephalopathy, acute on chronic kidney disease stage III -IV and transaminitis.  Hospital course   Active Problems:   DKA (diabetic ketoacidoses) ([place]) Uncontrolled type 1 diabetes mellitus.  Reportedly missed her insulin after not feeling well.  DKA resolved with IV fluids, IV insulin, now on Lantus and sliding scale coverage.  Added premeal aspart. Diabetic coordinator consult appreciated.  A1c of 12.2.  I have increased her Lantus to 20 units daily.  Continue premeal aspart.  Close follow-up as outpatient needed.  Active symptoms Acute kidney injury superimposed on chronic kidney disease stage IIIb Likely prerenal with dehydration and DKA.  Renal function continues  improve with hydration.  Nephrology consult appreciated.    Plans on outpatient follow-up.  Acute transaminitis Has underlying chronic thrombocytopenia dating back to 2017 with elevated alkaline phosphatase and intermittently high transaminases which has never been addressed in the past.  Autoimmune work-up ordered..   Has elevated GGT but patient denies alcohol use upon repeat questioning.  No IV drug use.  Hepatitis panel negative. LFTs slowly improving.  Ultrasound abdomen showing cirrhotic changes.      Also shows diffuse gallbladder thickening with pericholecystic edema.    HIDA scan negative for acute cholecystitis.  Discontinued statin.   Follow LFTs as outpatient. GI consult appreciated.  Recommends outpatient follow-up.   Malnutrition of moderate degree Continue supplement  Anemia of chronic kidney disease Hemoglobin currently stable.  epo as outpatient   History of stroke Statin discontinued given transaminitis  Hypomagnesemia Replenished  Generalized weakness Home health PT arranged  Family Communication  :  Discussed with daughter on the phone  Disposition Plan  : Home    Consults  : None  Procedures  : Renal ultrasound, abdominal ultrasound, HIDA scan  Discharge Instructions   Allergies as of 12/30/2019   No Known Allergies     Medication List    STOP taking these medications   atorvastatin 80 MG tablet Commonly known as: LIPITOR     TAKE these medications   amLODipine 5 MG tablet Commonly known as: NORVASC Take 5 mg by mouth daily.   aspirin EC 81 MG tablet Take 81 mg by mouth daily.   Ensure Max Protein Liqd Take 330 mLs (11 oz total) by mouth 2 (two) times daily between meals.   insulin lispro 100 UNIT/ML KwikPen Commonly  known as: HumaLOG KwikPen Inject 0.1 mLs (10 Units total) into the skin 3 (three) times daily before meals.   Lantus SoloStar 100 UNIT/ML Solostar Pen Generic drug: Insulin Glargine Inject 20 Units into the  skin at bedtime. What changed:   how much to take  when to take this   lisinopril 2.5 MG tablet Commonly known as: ZESTRIL Take 2.5 mg by mouth every evening.   metoprolol tartrate 25 MG tablet Commonly known as: LOPRESSOR Take 25 mg by mouth 2 (two) times daily.            Durable Medical Equipment  (From admission, onward)         Start     Ordered   12/30/19 1141  For home use only DME Walker rolling  Once    Question Answer Comment  Walker: With [place] Wheels   Patient needs a walker to treat with the following condition Weakness      12/30/19 1140   12/29/19 1150  For home use only DME 3 n 1  Once     12/29/19 1149   12/27/19 1229  For home use only DME Walker rolling  Once    Comments: 3 in 1  Question:  Patient needs a walker to treat with the following condition  Answer:  Generalized weakness   12/27/19 1229         Follow-up Information    Rochel Brome, MD Follow up in 1 week(s).   Specialty: Internal Medicine Contact information: [address] [facility] [place] [address] [phone]        Virgel Manifold, MD Follow up in 3 week(s).   Specialty: Gastroenterology Why: office will call Contact information: [place] [address] [phone]        Anthonette Legato, MD Follow up in 4 week(s).   Specialty: Nephrology Why: office will call  Contact information: [place] [address] [phone]          No Known Allergies     Procedures/Studies: CT Head Wo Contrast  Result Date: 12/25/2019 CLINICAL DATA:  Encephalopathy EXAM: CT HEAD WITHOUT CONTRAST TECHNIQUE: Contiguous axial images were obtained from the base of the skull through the vertex without intravenous contrast. COMPARISON:  None. FINDINGS: Brain: No definite evidence of acute infarction, hemorrhage, hydrocephalus, extra-axial collection or mass lesion/mass effect. There are multiple  bilateral cerebral hypodensities, a large, encephalomalacic appearing hypodensity of the frontoparietal right hemisphere (series 2, image 19), of the left frontal pole (series 2, image 70), and of the parietooccipital left hemisphere (series 2, image 16). Vascular: No hyperdense vessel or unexpected calcification. Skull: Normal. Negative for fracture or focal lesion. Sinuses/Orbits: No acute finding. Other: None. IMPRESSION: 1. Multiple bilateral cerebral hypodensities, a large, encephalomalacic appearing hypodensity of the frontoparietal right hemisphere, of the left frontal pole, and of the parietooccipital left hemisphere. 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 2. Consider MR examination of the brain to better evaluate for acute diffusion restricting infarction if suspected. Electronically Signed   By: Eddie Candle M.D.   On: 12/25/2019 17:46   MR BRAIN WO CONTRAST  Result Date: 12/25/2019 CLINICAL DATA:  Encephalopathy.  Multiple old infarctions by CT. EXAM: MRI HEAD WITHOUT CONTRAST TECHNIQUE: Multiplanar, multiecho pulse sequences of the brain and surrounding structures were obtained without intravenous contrast. COMPARISON:  Head CT same day FINDINGS: Brain: The patient would only allow diffusion imaging. Diffusion  imaging does not show any acute or subacute infarction. As shown by CT, there are old small vessel cerebellar infarctions bilaterally, and old left occipital cortical infarction, old left parietal cortical infarction, old left frontal cortical infarction, and old right frontoparietal cortical infarction. Some dystrophic calcification associated with the right frontoparietal infarction. No sign of mass lesion, recent hemorrhage, hydrocephalus or extra-axial collection. Vascular: Major vessels at the base of the brain show flow. Skull and upper cervical spine: Negative Sinuses/Orbits: Clear/normal Other: None IMPRESSION: Patient allowed only diffusion imaging to be performed.  Diffusion imaging does not show any acute or subacute infarction. Extensive old infarctions throughout the brain as outlined above. Electronically Signed   By: Nelson Chimes M.D.   On: 12/25/2019 19:34   US Renal  Result Date: 12/27/2019 CLINICAL DATA:  59 year old female with acute renal failure. EXAM: RENAL / URINARY TRACT ULTRASOUND COMPLETE COMPARISON:  Renal ultrasound 12/10/2018. FINDINGS: Right Kidney: Renal measurements: 7.8 x 2.9 x 4.4 centimeters = volume: 53 mL (previously 84 mL). Echogenic right kidney (image 7) with no hydronephrosis or solid renal mass. Left Kidney: Renal measurements: 8.3 x 4.3 x 4.0 centimeters = volume: 76 mL (previously 69 mL). Left renal cortex echogenicity appears more normal. No left hydronephrosis or solid renal mass. Bladder: Abundant echogenic urinary debris within the bladder (image 28). Confluent debris depicted on image 34. Mild circumferential bladder wall thickening. Other: Abnormal appearance of the gallbladder with suggestion of pericholecystic fluid (image 15) and layering sludge. Gallbladder wall thickness seems to remain normal. IMPRESSION: 1. Abundant debris within the urinary bladder. Recommend correlation with urinalysis. 2. No acute renal findings. 3. Abnormal gallbladder with layering sludge and pericholecystic fluid. The appearance is nonspecific. If there is right upper quadrant abdominal pain recommend dedicated gallbladder ultrasound. Electronically Signed   By: Genevie Ann M.D.   On: 12/27/2019 14:01   NM Hepato W/EjeCT Fract  Result Date: 12/29/2019 CLINICAL DATA:  Cholecystitis. EXAM: NUCLEAR MEDICINE HEPATOBILIARY IMAGING WITH GALLBLADDER EF TECHNIQUE: Sequential images of the abdomen were obtained out to 60 minutes following intravenous administration of radiopharmaceutical. After oral ingestion of Ensure, gallbladder ejection fraction was determined. RADIOPHARMACEUTICALS:  5.4 mCi Tc-31m Choletec IV COMPARISON:  Ultrasound 12/28/2019. FINDINGS:  Prompt uptake and biliary excretion of activity by the liver is seen. Gallbladder activity is visualized, consistent with patency of cystic duct. Biliary activity passes into small bowel, consistent with patent common bile duct. Gallbladder ejection fraction was determined at 2 hours to ensure that measurement of the gallbladder was obtained. Calculated gallbladder ejection fraction is 67%. (Normal gallbladder ejection fraction with Ensure is greater than 33%.) IMPRESSION: Normal exam. Electronically Signed   By: TMarcello Moores Register   On: 12/29/2019 11:49   UKoreaAbdomen Limited RUQ  Result Date: 12/28/2019 CLINICAL DATA:  Abnormal appearance of the gallbladder on previous study. Transaminitis. EXAM: ULTRASOUND ABDOMEN LIMITED RIGHT UPPER QUADRANT COMPARISON:  Renal ultrasound 12/27/2019 FINDINGS: Gallbladder: Gallbladder wall is diffusely thickened measuring up to 7 mm in thickness. There is prominent pericholecystic edema. Gallbladder is not dilated. There are no shadowing gallstones evident. No sonographic Murphy sign noted by sonographer. Common bile duct: Diameter: 5 mm Liver: Subtly nodular surface contour of the liver with coarsened hepatic echotexture. No focal lesion identified. Portal vein is patent on color Doppler imaging with normal direction of blood flow towards the liver. Other: None. IMPRESSION: 1. Heterogeneous appearance of the hepatic parenchyma with coarsened echotexture and a subtly nodular surface contour. Findings can be seen in the setting of cirrhosis  as well as hepatitis. 2. Diffuse gallbladder wall thickening with associated pericholecystic edema. Findings can be seen in the setting of acute cholecystitis, although are not specific. Notably, the sonographer reported a negative sonographic Murphy's sign. Other hepatic and systemic processes can also result in these findings including cirrhosis, hepatitis, and low protein states. If there is clinical suspicion for acute cholecystitis, a  nuclear medicine hepatobiliary scan could be performed to evaluate patency of the cystic and common bile ducts. Electronically Signed   By: Davina Poke D.O.   On: 12/28/2019 09:09       Subjective: CBG elevated yesterday.  Stable overnight.  No other issues.  Discharge Exam: Vitals:   12/29/19 2226 12/30/19 0743  BP: (!) 116/58 140/79  Pulse: 87 86  Resp:  16  Temp:  98 F (36.7 C)  SpO2: 99% 100%   Vitals:   12/28/19 2306 12/29/19 1702 12/29/19 2226 12/30/19 0743  BP: (!) 100/57 135/71 (!) 116/58 140/79  Pulse: 88 87 87 86  Resp: 16 16  16   Temp: 98.9 F (37.2 C) 98.4 F (36.9 C)  98 F (36.7 C)  TempSrc: Oral Oral    SpO2: 98% 100% 99% 100%  Weight:      Height:        General: Middle-aged female not in distress HEENT: Moist mucosa, supple neck Chest: Clear CVs: Normal S1-S2 GI: Soft, nondistended, nontender Musculoskeletal: Warm, no edema    The results of significant diagnostics from this hospitalization (including imaging, microbiology, ancillary and laboratory) are listed below for reference.     Microbiology: Recent Results (from the past 240 hour(s))  Urine Culture     Status: Abnormal   Collection Time: 12/26/19  4:13 AM   Specimen: Urine, Random  Result Value Ref Range Status   Specimen Description   Final    URINE, RANDOM Performed at [facility], [address], [place], [place] [address]    Special Requests   Final    NONE Performed at [facility], [address] [place], [place] [address]    Culture MULTIPLE SPECIES PRESENT, SUGGEST RECOLLECTION (A)  Final   Report Status 12/27/2019 FINAL  Final  SARS CORONAVIRUS 2 (TAT 6-24 HRS) Nasopharyngeal Nasopharyngeal Swab     Status: None   Collection Time: 12/26/19  5:15 PM   Specimen: Nasopharyngeal Swab  Result Value Ref Range Status   SARS Coronavirus 2 NEGATIVE NEGATIVE Final    Comment: (NOTE) SARS-CoV-2 target nucleic acids are NOT DETECTED. The SARS-CoV-2  RNA is generally detectable in upper and lower respiratory specimens during the acute phase of infection. Negative results do not preclude SARS-CoV-2 infection, do not rule out co-infections with other pathogens, and should not be used as the sole basis for treatment or other patient management decisions. Negative results must be combined with clinical observations, patient history, and epidemiological information. The expected result is Negative. Fact Sheet for Patients: SugarRoll.be Fact Sheet for Healthcare Providers: https://www.woods-mathews.com/ This test is not yet approved or cleared by the [place] FDA and  has been authorized for detection and/or diagnosis of SARS-CoV-2 by FDA under an Emergency Use Authorization (EUA). This EUA will remain  in effect (meaning this test can be used) for the duration of the COVID-19 declaration under Section 56 4(b)(1) of the Act, 21 U.S.C. section 360bbb-3(b)(1), unless the authorization is terminated or revoked sooner. Performed at [facility], [place] [address], [place], [place] [address]      Labs: BNP (  last 3 results) No results for input(s): BNP in the last 8760 hours. Basic Metabolic Panel: Recent Labs  Lab 12/26/19 0413 12/26/19 1104 12/27/19 0609 12/28/19 0353 12/29/19 0444 12/30/19 0316  NA 134* 138 139 138 139 138  K 4.1 4.2 4.5 4.2 3.9 4.0  CL 98 100 106 106 107 105  CO2 22 25 25 26 27 25   GLUCOSE 644* 167* 244* 150* 151* 270*  BUN 78* 80* 78* 69* 64* 58*  CREATININE 2.96* 3.06* 2.63* 2.10* 1.76* 1.73*  CALCIUM 9.0 9.2 8.6* 8.6* 8.3* 8.3*  MG 2.0  --  1.9 1.8 1.5* 2.0  PHOS 2.8  --  2.5 2.8 3.2 2.7   Liver Function Tests: Recent Labs  Lab 12/26/19 0413 12/27/19 0609 12/28/19 0353 12/29/19 0444 12/30/19 0316  AST 23 61* 346* 158* 77*  ALT 41 60* 280* 220* 165*  ALKPHOS 270* 221* 264* 291* 287*  BILITOT 0.6 0.9 1.0 0.7 0.8  PROT 6.0* 5.3* 5.5* 5.0* 5.0*   ALBUMIN 3.4* 2.9* 2.9* 2.5* 2.4*   No results for input(s): LIPASE, AMYLASE in the last 168 hours. No results for input(s): AMMONIA in the last 168 hours. CBC: Recent Labs  Lab 12/25/19 1555 12/26/19 0413 12/27/19 0609 12/28/19 0353 12/29/19 0444 12/30/19 0316  WBC 8.8 9.6 5.3 2.0* 2.7* 3.2*  NEUTROABS 8.0*  --   --   --   --   --   HGB 8.8* 8.3* 8.2* 8.8* 8.8* 9.0*  HCT 28.1* 24.5* 24.7* 26.7* 26.9* 27.5*  MCV 96.2 89.1 90.8 92.4 92.4 92.6  PLT 109* 100* 72* 53* 54* 56*   Cardiac Enzymes: No results for input(s): CKTOTAL, CKMB, CKMBINDEX, TROPONINI in the last 168 hours. BNP: Invalid input(s): POCBNP CBG: Recent Labs  Lab 12/29/19 0742 12/29/19 1144 12/29/19 1701 12/29/19 2142 12/30/19 0740  GLUCAP 170* 313* 425* 252* 362*   D-Dimer No results for input(s): DDIMER in the last 72 hours. Hgb A1c No results for input(s): HGBA1C in the last 72 hours. Lipid Profile No results for input(s): CHOL, HDL, LDLCALC, TRIG, CHOLHDL, LDLDIRECT in the last 72 hours. Thyroid function studies No results for input(s): TSH, T4TOTAL, T3FREE, THYROIDAB in the last 72 hours.  Invalid input(s): FREET3 Anemia work up Recent Labs    12/29/19 1442  FERRITIN 268  TIBC 245*  IRON 71   Urinalysis    Component Value Date/Time   COLORURINE YELLOW (A) 12/26/2019 0414   APPEARANCEUR CLEAR (A) 12/26/2019 0414   LABSPEC 1.016 12/26/2019 0414   PHURINE 5.0 12/26/2019 0414   GLUCOSEU >=500 (A) 12/26/2019 0414   HGBUR NEGATIVE 12/26/2019 [place] NEGATIVE 12/26/2019 0414   KETONESUR 5 (A) 12/26/2019 0414   PROTEINUR NEGATIVE 12/26/2019 0414   NITRITE NEGATIVE 12/26/2019 0414   LEUKOCYTESUR NEGATIVE 12/26/2019 0414   Sepsis Labs Invalid input(s): PROCALCITONIN,  WBC,  LACTICIDVEN Microbiology Recent Results (from the past 240 hour(s))  Urine Culture     Status: Abnormal   Collection Time: 12/26/19  4:13 AM   Specimen: Urine, Random  Result Value Ref Range Status    Specimen Description   Final    URINE, RANDOM Performed at [facility], [address], [place], [place] [address]    Special Requests   Final    NONE Performed at [facility], [address] [place], [place] [address]    Culture MULTIPLE SPECIES PRESENT, SUGGEST RECOLLECTION (A)  Final   Report Status 12/27/2019 FINAL  Final  SARS CORONAVIRUS 2 (TAT  6-24 HRS) Nasopharyngeal Nasopharyngeal Swab     Status: None   Collection Time: 12/26/19  5:15 PM   Specimen: Nasopharyngeal Swab  Result Value Ref Range Status   SARS Coronavirus 2 NEGATIVE NEGATIVE Final    Comment: (NOTE) SARS-CoV-2 target nucleic acids are NOT DETECTED. The SARS-CoV-2 RNA is generally detectable in upper and lower respiratory specimens during the acute phase of infection. Negative results do not preclude SARS-CoV-2 infection, do not rule out co-infections with other pathogens, and should not be used as the sole basis for treatment or other patient management decisions. Negative results must be combined with clinical observations, patient history, and epidemiological information. The expected result is Negative. Fact Sheet for Patients: SugarRoll.be Fact Sheet for Healthcare Providers: https://www.woods-mathews.com/ This test is not yet approved or cleared by the [place] FDA and  has been authorized for detection and/or diagnosis of SARS-CoV-2 by FDA under an Emergency Use Authorization (EUA). This EUA will remain  in effect (meaning this test can be used) for the duration of the COVID-19 declaration under Section 56 4(b)(1) of the Act, 21 U.S.C. section 360bbb-3(b)(1), unless the authorization is terminated or revoked sooner. Performed at [facility], [place] [address], [place], [place] [address]      Time coordinating discharge: 35 minutes  SIGNED:   Louellen Molder, MD  [facility] 12/30/2019, 11:48 AM Pager   If  7PM-7AM, please contact night-coverage [email or web address] Password [email or web address]

## 2019-12-30 NOTE — Progress Notes (Signed)
Occupational Therapy Treatment Patient Details Name: Susan Navarro MRN: [id number] DOB: January 27, 1961 Today's Date: 12/30/2019    History of present illness Pt is 59 y/o F with PMH DM1-poorly controlled, CKD IV, Thrombocytopenia, HLD, and HTN. Pt adm for DKA and acute metabolic encephalopathy.   OT comments  Pt seen for OT tx this date. Pt eager to return home later this date and agreeable to bathing and dressing in preparation for discharge later. Pt required set up and supervision for UB/LB bathing and dressing with intermittent Minimal assist for washing back, clasping bra in back, and ensuring her IV site didn't get caught on her shirt sleeve. No LOB noted throughout sit to stand ADL transfers. Recommendations updated based on pt's progress.    Follow Up Recommendations  Supervision - Intermittent;No OT follow up    Equipment Recommendations  3 in 1 bedside commode;Tub/shower seat    Recommendations for Other Services      Precautions / Restrictions Precautions Precautions: Fall Restrictions Weight Bearing Restrictions: No       Mobility Bed Mobility Overal bed mobility: Independent             General bed mobility comments: easily gets to sitting EOB  Transfers Overall transfer level: Independent Equipment used: None Transfers: Sit to/from [employer] transfer comment: easily gets to standing w/o assist    Balance Overall balance assessment: Independent                                         ADL either performed or assessed with clinical judgement   ADL Overall ADL's : Needs assistance/impaired     Grooming: Wash/dry hands;Wash/dry face;Oral care;Supervision/safety;Standing   Upper Body Bathing: Set up;Sitting;Supervision/ safety   Lower Body Bathing: Supervison/ safety;Set up;Sit to/from stand   Upper Body Dressing : Sitting;Minimal assistance;Supervision/safety Upper Body Dressing Details (indicate cue type and reason):  Min A to minimize snagging of shirt sleeve on IV access Lower Body Dressing: Supervision/safety;Set up;Sit to/from stand                       Vision Baseline Vision/History: Wears glasses Wears Glasses: At all times Patient Visual Report: No change from baseline     Perception     Praxis      Cognition Arousal/Alertness: Awake/alert Behavior During Therapy: WFL for tasks assessed/performed Overall Cognitive Status: Within Functional Limits for tasks assessed                                          Exercises Exercises: General Lower Extremity General Exercises - Lower Extremity Ankle Circles/Pumps: AROM;10 reps Long Arc Quad: Strengthening;10 reps Heel Slides: Strengthening;10 reps Hip ABduction/ADduction: Strengthening;10 reps Hip Flexion/Marching: Strengthening;10 reps   Shoulder Instructions       General Comments      Pertinent Vitals/ Pain       Pain Assessment: No/denies pain  Home Living                                          Prior Functioning/Environment  Frequency  Min 1X/week        Progress Toward Goals  OT Goals(current goals can now be found in the care plan section)  Progress towards OT goals: Progressing toward goals  Acute Rehab OT Goals Patient Stated Goal: to get to feeling better and get home OT Goal Formulation: With patient Time For Goal Achievement: 01/10/20 Potential to Achieve Goals: Good  Plan Frequency remains appropriate;Discharge plan needs to be updated    Co-evaluation                 AM-PAC OT "6 Clicks" Daily Activity     Outcome Measure   Help from another person eating meals?: None Help from another person taking care of personal grooming?: None Help from another person toileting, which includes using toliet, bedpan, or urinal?: A Little Help from another person bathing (including washing, rinsing, drying)?: A Little Help from another person  to put on and taking off regular upper body clothing?: None Help from another person to put on and taking off regular lower body clothing?: A Little 6 Click Score: 21    End of Session    OT Visit Diagnosis: Unsteadiness on feet (R26.81);Muscle weakness (generalized) (M62.81)   Activity Tolerance Patient tolerated treatment well   Patient Left in bed;with call bell/phone within reach   Nurse Communication          Time: 1026-1100 OT Time Calculation (min): 34 min  Charges: OT General Charges $OT Visit: 1 Visit OT Treatments $Self Care/Home Management : 23-37 mins  Jeni Salles, MPH, MS, OTR/L ascom [phone] 12/30/19, 11:34 AM

## 2020-01-02 ENCOUNTER — Encounter: Payer: Self-pay | Admitting: Gastroenterology

## 2020-01-02 ENCOUNTER — Telehealth: Payer: Self-pay | Admitting: Gastroenterology

## 2020-01-02 ENCOUNTER — Ambulatory Visit (INDEPENDENT_AMBULATORY_CARE_PROVIDER_SITE_OTHER): Payer: Medicare HMO | Admitting: Gastroenterology

## 2020-01-02 ENCOUNTER — Other Ambulatory Visit: Payer: Self-pay

## 2020-01-02 DIAGNOSIS — R748 Abnormal levels of other serum enzymes: Secondary | ICD-10-CM

## 2020-01-02 DIAGNOSIS — K743 Primary biliary cirrhosis: Secondary | ICD-10-CM

## 2020-01-02 NOTE — Telephone Encounter (Signed)
Left vm to schedule 3 month f/u apt °

## 2020-01-02 NOTE — Progress Notes (Signed)
Susan Antigua, MD [address]  [place]  [place], [place] [address]  Main: [phone]  Fax: [phone]   Primary Care Physician: Rochel Brome, MD  Virtual Visit via Telephone Note  I connected with patient on 01/02/20 at  2:00 PM EST by telephone and verified that I am speaking with the correct person using two identifiers.   I discussed the limitations, risks, security and privacy concerns of performing an evaluation and management service by telephone and the availability of in person appointments. I also discussed with the patient that there may be a patient responsible charge related to this service. The patient expressed understanding and agreed to proceed.  Location of Patient: Home Location of Provider: Home Persons involved: Patient and provider only during the visit (nursing staff and front desk staff was involved in communicating with the patient prior to the appointment, reviewing medications and checking them in)   History of Present Illness: Chief Complaint  Patient presents with  . New Patient (Initial Visit)  . Hospitalization Follow-up     HPI: Susan Navarro is a 59 y.o. female being seen for hospital follow-up of elevated liver enzymes.  Patient was found to have chronically elevated liver enzymes and underwent work-up.  Ultrasound suggested evidence of cirrhosis and patient has chronically low platelets.  Autoimmune and viral labs were ordered and reveal a positive AMA and increased alk phos  Denies any previous or recent alcohol use.  Patient did not know the diagnosis of cirrhosis in the past.  Denies any episodes of bleeding or confusion.  Current Outpatient Medications  Medication Sig Dispense Refill  . amLODipine (NORVASC) 5 MG tablet Take 5 mg by mouth daily.    Marland Kitchen aspirin EC 81 MG tablet Take 81 mg by mouth daily.    . Ensure Max Protein (ENSURE MAX PROTEIN) LIQD Take 330 mLs (11 oz total) by mouth 2 (two) times daily between  meals. 330 mL 12  . Insulin Glargine (LANTUS SOLOSTAR) 100 UNIT/ML Solostar Pen Inject 20 Units into the skin at bedtime. 15 mL 11  . insulin lispro (HUMALOG KWIKPEN) 100 UNIT/ML KwikPen Inject 0.1 mLs (10 Units total) into the skin 3 (three) times daily before meals. 15 mL 11  . lisinopril (ZESTRIL) 2.5 MG tablet Take 2.5 mg by mouth every evening.     . metoprolol tartrate (LOPRESSOR) 25 MG tablet Take 25 mg by mouth 2 (two) times daily.      No current facility-administered medications for this visit.    Allergies as of 01/02/2020  . (No Known Allergies)    Review of Systems:    All systems reviewed and negative except where noted in HPI.   Observations/Objective:  Labs: CMP     Component Value Date/Time   NA 138 12/30/2019 0316   K 4.0 12/30/2019 0316   CL 105 12/30/2019 0316   CO2 25 12/30/2019 0316   GLUCOSE 270 (H) 12/30/2019 0316   BUN 58 (H) 12/30/2019 0316   CREATININE 1.73 (H) 12/30/2019 0316   CALCIUM 8.3 (L) 12/30/2019 0316   PROT 5.0 (L) 12/30/2019 0316   ALBUMIN 2.4 (L) 12/30/2019 0316   AST 77 (H) 12/30/2019 0316   ALT 165 (H) 12/30/2019 0316   ALKPHOS 287 (H) 12/30/2019 0316   BILITOT 0.8 12/30/2019 0316   GFRNONAA 32 (L) 12/30/2019 0316   GFRAA 37 (L) 12/30/2019 0316   Lab Results  Component Value Date   WBC 3.2 (L) 12/30/2019   HGB 9.0 (L)  12/30/2019   HCT 27.5 (L) 12/30/2019   MCV 92.6 12/30/2019   PLT 56 (L) 12/30/2019    Imaging Studies: CT Head Wo Contrast  Result Date: 12/25/2019 CLINICAL DATA:  Encephalopathy EXAM: CT HEAD WITHOUT CONTRAST TECHNIQUE: Contiguous axial images were obtained from the base of the skull through the vertex without intravenous contrast. COMPARISON:  None. FINDINGS: Brain: No definite evidence of acute infarction, hemorrhage, hydrocephalus, extra-axial collection or mass lesion/mass effect. There are multiple bilateral cerebral hypodensities, a large, encephalomalacic appearing hypodensity of the frontoparietal right  hemisphere (series 2, image 19), of the left frontal pole (series 2, image 70), and of the parietooccipital left hemisphere (series 2, image 16). Vascular: No hyperdense vessel or unexpected calcification. Skull: Normal. Negative for fracture or focal lesion. Sinuses/Orbits: No acute finding. Other: None. IMPRESSION: 1. Multiple bilateral cerebral hypodensities, a large, encephalomalacic appearing hypodensity of the frontoparietal right hemisphere, of the left frontal pole, and of the parietooccipital left hemisphere. 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 2. Consider MR examination of the brain to better evaluate for acute diffusion restricting infarction if suspected. Electronically Signed   By: Eddie Candle M.D.   On: 12/25/2019 17:46   MR BRAIN WO CONTRAST  Result Date: 12/25/2019 CLINICAL DATA:  Encephalopathy.  Multiple old infarctions by CT. EXAM: MRI HEAD WITHOUT CONTRAST TECHNIQUE: Multiplanar, multiecho pulse sequences of the brain and surrounding structures were obtained without intravenous contrast. COMPARISON:  Head CT same day FINDINGS: Brain: The patient would only allow diffusion imaging. Diffusion imaging does not show any acute or subacute infarction. As shown by CT, there are old small vessel cerebellar infarctions bilaterally, and old left occipital cortical infarction, old left parietal cortical infarction, old left frontal cortical infarction, and old right frontoparietal cortical infarction. Some dystrophic calcification associated with the right frontoparietal infarction. No sign of mass lesion, recent hemorrhage, hydrocephalus or extra-axial collection. Vascular: Major vessels at the base of the brain show flow. Skull and upper cervical spine: Negative Sinuses/Orbits: Clear/normal Other: None IMPRESSION: Patient allowed only diffusion imaging to be performed. Diffusion imaging does not show any acute or subacute infarction. 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 Electronically Signed   By: Nelson Chimes M.D.   On: 12/25/2019 19:34   US Renal  Result Date: 12/27/2019 CLINICAL DATA:  59 year old female with acute renal failure. EXAM: RENAL / URINARY TRACT ULTRASOUND COMPLETE COMPARISON:  Renal ultrasound 12/10/2018. FINDINGS: Right Kidney: Renal measurements: 7.8 x 2.9 x 4.4 centimeters = volume: 53 mL (previously 84 mL). Echogenic right kidney (image 7) with no hydronephrosis or solid renal mass. Left Kidney: Renal measurements: 8.3 x 4.3 x 4.0 centimeters = volume: 76 mL (previously 69 mL). Left renal cortex echogenicity appears more normal. No left hydronephrosis or solid renal mass. Bladder: Abundant echogenic urinary debris within the bladder (image 28). Confluent debris depicted on image 34. Mild circumferential bladder wall thickening. Other: Abnormal appearance of the gallbladder with suggestion of pericholecystic fluid (image 15) and layering sludge. Gallbladder wall thickness seems to remain normal. IMPRESSION: 1. Abundant debris within the urinary bladder. Recommend correlation with urinalysis. 2. No acute renal findings. 3. Abnormal gallbladder with layering sludge and pericholecystic fluid. The appearance is nonspecific. If there is right upper quadrant abdominal pain recommend dedicated gallbladder ultrasound. Electronically Signed   By: Genevie Ann M.D.   On: 12/27/2019 14:01   NM Hepato W/EjeCT Fract  Result Date: 12/29/2019 CLINICAL DATA:  Cholecystitis. EXAM: NUCLEAR MEDICINE HEPATOBILIARY IMAGING WITH GALLBLADDER EF  TECHNIQUE: Sequential images of the abdomen were obtained out to 60 minutes following intravenous administration of radiopharmaceutical. After oral ingestion of Ensure, gallbladder ejection fraction was determined. RADIOPHARMACEUTICALS:  5.4 mCi Tc-39m Choletec IV COMPARISON:  Ultrasound 12/28/2019. FINDINGS: Prompt uptake and biliary excretion of activity by the liver is seen. Gallbladder activity is visualized,  consistent with patency of cystic duct. Biliary activity passes into small bowel, consistent with patent common bile duct. Gallbladder ejection fraction was determined at 2 hours to ensure that measurement of the gallbladder was obtained. Calculated gallbladder ejection fraction is 67%. (Normal gallbladder ejection fraction with Ensure is greater than 33%.) IMPRESSION: Normal exam. Electronically Signed   By: TMarcello Moores Register   On: 12/29/2019 11:49   [place]domen Limited RUQ  Result Date: 12/28/2019 CLINICAL DATA:  Abnormal appearance of the gallbladder on previous study. Transaminitis. EXAM: ULTRASOUND ABDOMEN LIMITED RIGHT UPPER QUADRANT COMPARISON:  Renal ultrasound 12/27/2019 FINDINGS: Gallbladder: Gallbladder wall is diffusely thickened measuring up to 7 mm in thickness. There is prominent pericholecystic edema. Gallbladder is not dilated. There are no shadowing gallstones evident. No sonographic Murphy sign noted by sonographer. Common bile duct: Diameter: 5 mm Liver: Subtly nodular surface contour of the liver with coarsened hepatic echotexture. No focal lesion identified. Portal vein is patent on color Doppler imaging with normal direction of blood flow towards the liver. Other: None. IMPRESSION: 1. Heterogeneous appearance of the hepatic parenchyma with coarsened echotexture and a subtly nodular surface contour. Findings can be seen in the setting of cirrhosis as well as hepatitis. 2. Diffuse gallbladder wall thickening with associated pericholecystic edema. Findings can be seen in the setting of acute cholecystitis, although are not specific. Notably, the sonographer reported a negative sonographic Murphy's sign. Other hepatic and systemic processes can also result in these findings including cirrhosis, hepatitis, and low protein states. If there is clinical suspicion for acute cholecystitis, a nuclear medicine hepatobiliary scan could be performed to evaluate patency of the cystic and common bile ducts.  Electronically Signed   By: NDavina PokeD.O.   On: 12/28/2019 09:09    Assessment and Plan:   VKiandria Clumis a 59y.o. y/o female here for follow-up of elevated liver enzymes  Assessment and Plan: Findings most consistent with PBC However, will obtain liver biopsy as AMA titer is not higher than 1:40, which is one of the criteria required for diagnosis.  Alk phos is more than 1.5 times upper limit of normal, which is also one of the criteria for diagnosis  Liver biopsy will also help confirm the diagnosis and follow disease process  Once liver biopsies confirm the diagnosis, can start on ursodiol  Disease process explained to the patient and need for avoiding hepatotoxic drugs including Tylenol and alcohol and any herbal supplements or over-the-counter products discussed in detail and she verbalized understanding  Patient will need to be scheduled for variceal screening on subsequent visits  Follow Up Instructions:    I discussed the assessment and treatment plan with the patient. The patient was provided an opportunity to ask questions and all were answered. The patient agreed with the plan and demonstrated an understanding of the instructions.   The patient was advised to call back or seek an in-person evaluation if the symptoms worsen or if the condition fails to improve as anticipated.  I provided 15 minutes of non-face-to-face time during this encounter. Additional time was spent in reviewing patient's chart, placing orders etc.   VVirgel Manifold MD  Speech recognition  software was used to dictate this note.

## 2020-01-04 ENCOUNTER — Telehealth: Payer: Self-pay

## 2020-01-04 NOTE — Telephone Encounter (Signed)
Called patient and informed patient that her Liver biopsy was scheduled on 01/12/2020 [facility] at 8:00 for a 8:30 appointment

## 2020-01-11 ENCOUNTER — Other Ambulatory Visit: Payer: Self-pay | Admitting: Radiology

## 2020-01-12 ENCOUNTER — Other Ambulatory Visit: Payer: Self-pay

## 2020-01-12 ENCOUNTER — Ambulatory Visit
Admission: RE | Admit: 2020-01-12 | Discharge: 2020-01-12 | Disposition: A | Discharge status: Home / Self Care | Payer: Medicare HMO | Source: Ambulatory Visit | Attending: Gastroenterology | Admitting: Gastroenterology

## 2020-01-12 DIAGNOSIS — R7401 Elevation of levels of liver transaminase levels: Secondary | ICD-10-CM | POA: Insufficient documentation

## 2020-01-12 DIAGNOSIS — R748 Abnormal levels of other serum enzymes: Secondary | ICD-10-CM

## 2020-01-12 LAB — CBC
HCT: 25.7 % — ABNORMAL LOW (ref 36.0–46.0)
Hemoglobin: 8.6 g/dL — ABNORMAL LOW (ref 12.0–15.0)
MCH: 30.6 pg (ref 26.0–34.0)
MCHC: 33.5 g/dL (ref 30.0–36.0)
MCV: 91.5 fL (ref 80.0–100.0)
Platelets: 121 10*3/uL — ABNORMAL LOW (ref 150–400)
RBC: 2.81 MIL/uL — ABNORMAL LOW (ref 3.87–5.11)
RDW: 13.3 % (ref 11.5–15.5)
WBC: 2.8 10*3/uL — ABNORMAL LOW (ref 4.0–10.5)
nRBC: 0 % (ref 0.0–0.2)

## 2020-01-12 LAB — GLUCOSE, CAPILLARY: Glucose-Capillary: 334 mg/dL — ABNORMAL HIGH (ref 70–99)

## 2020-01-12 LAB — PROTIME-INR
INR: 0.9 (ref 0.8–1.2)
Prothrombin Time: 12.3 seconds (ref 11.4–15.2)

## 2020-01-12 IMAGING — US US BIOPSY CORE LIVER
1 series · 5 of 5 positions shown · non-contrast
Comparison: none

CLINICAL DATA: Elevated LFTs. Normal scintigraphy. Nodular contour
on ultrasound suggesting cirrhosis.

EXAM:
ULTRASOUND-GUIDED CORE LIVER BIOPSY
TECHNIQUE: An ultrasound guided liver biopsy was thoroughly discussed with the
patient and questions were answered. The benefits, risks,
alternatives, and complications were also discussed. The patient
understands and wishes to proceed with the procedure. A verbal as
well as written consent was obtained.

[Series 1: us biopsy core liver · 0.23mm/px · 5 of 5 slices shown]
[im 1/5]
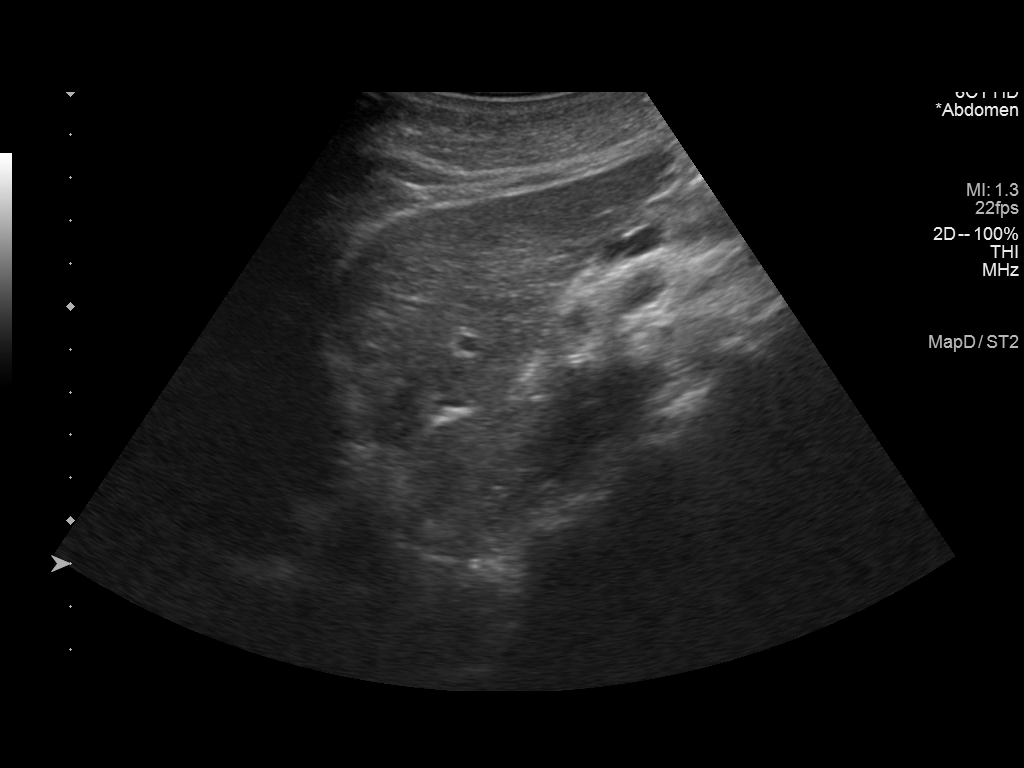
[im 2/5]
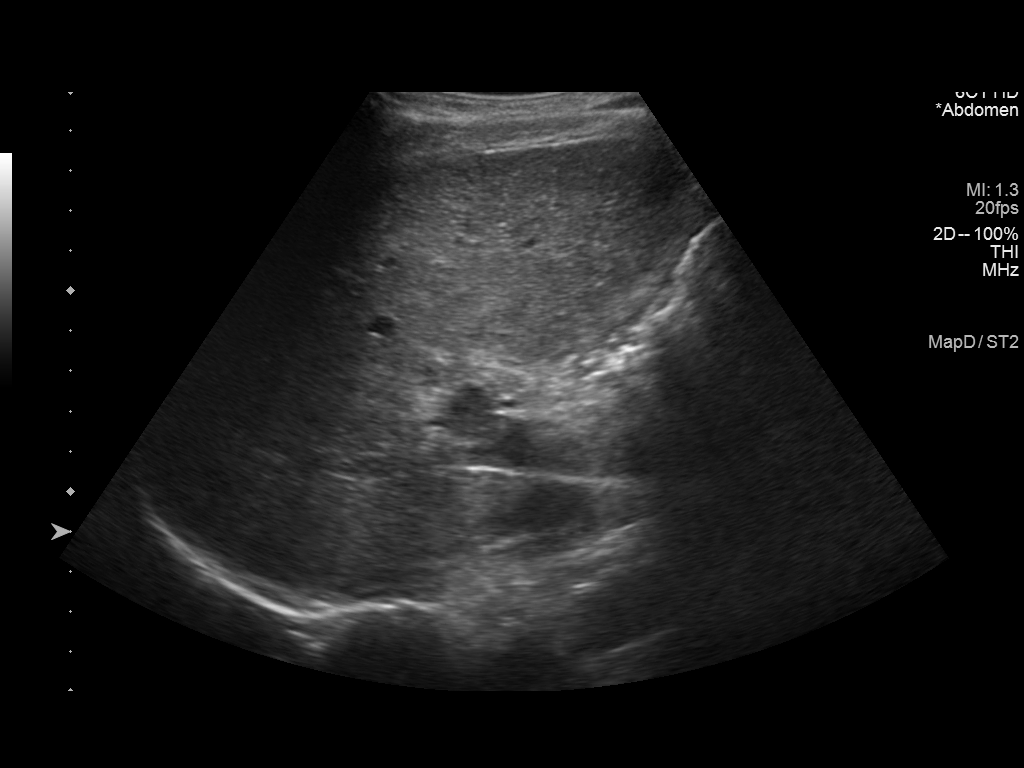
[im 3/5]
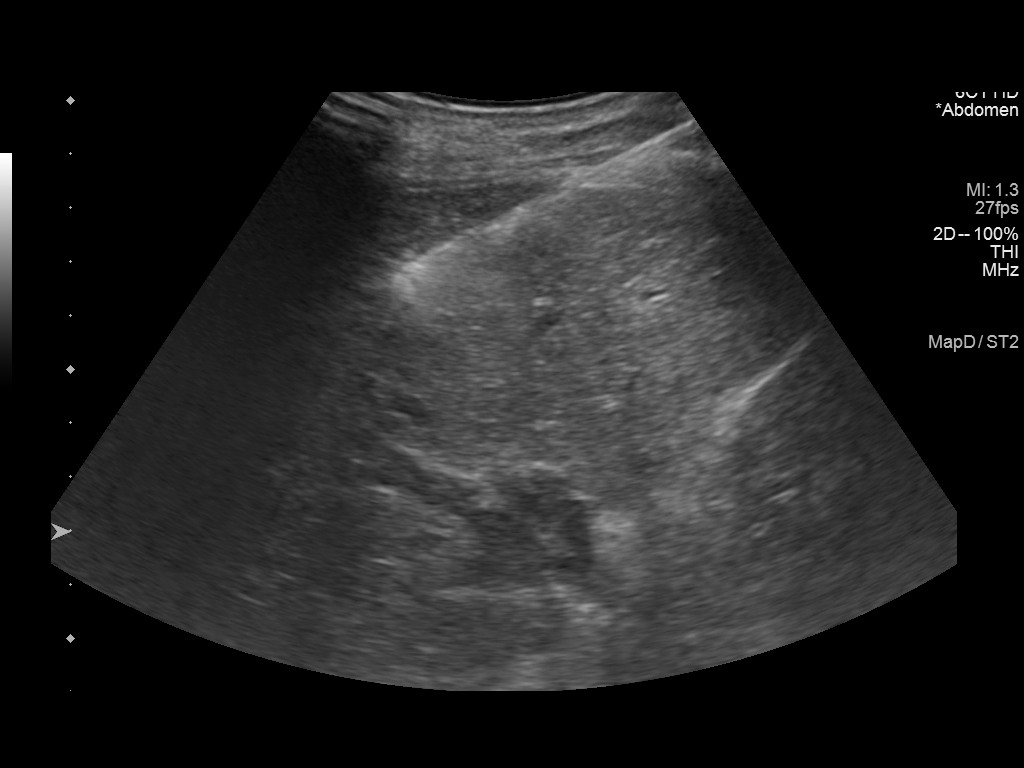
[im 4/5]
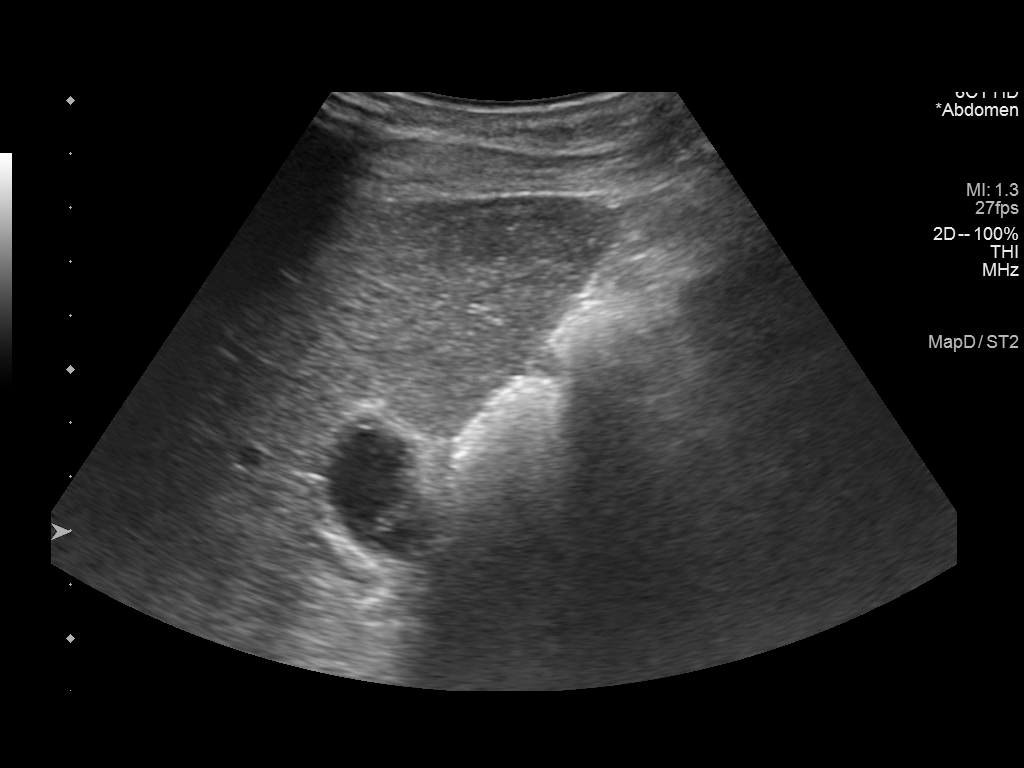
[im 5/5]
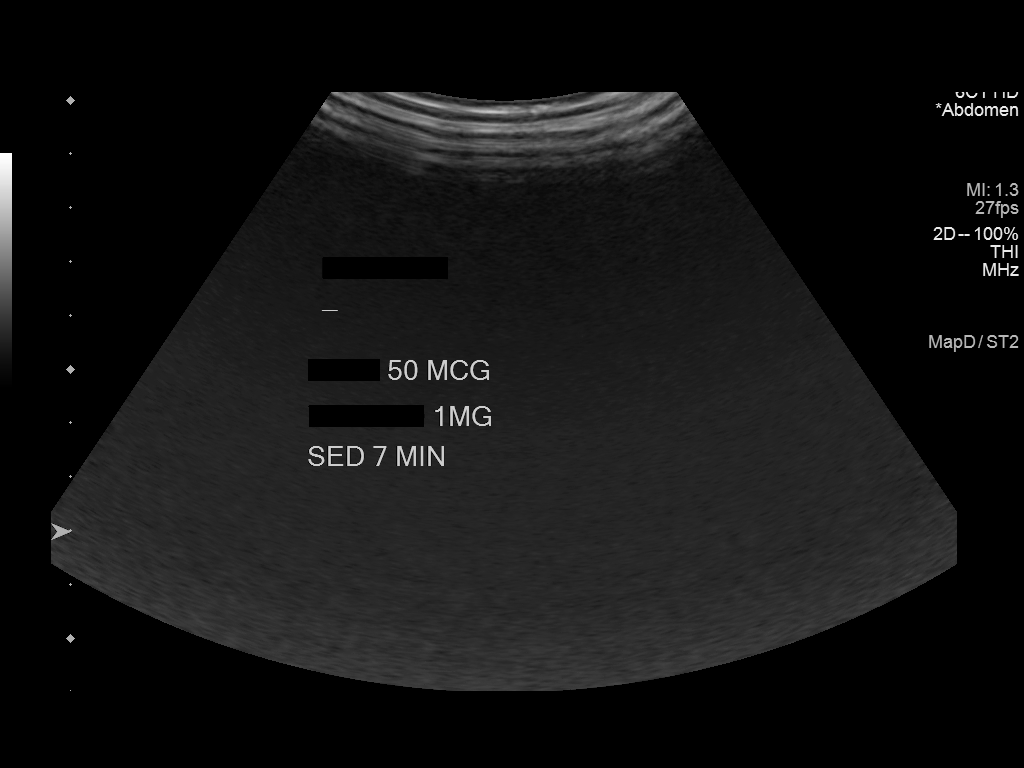

[5 of 5 positions shown; findings below may reference images not displayed]

Survey ultrasound of the liver was performed and an appropriate skin
entry site was determined. Skin site was marked, prepped with
chlorhexidine, and draped in usual sterile fashion, and infiltrated
locally with 1% lidocaine.

Intravenous Fentanyl [X0] and Versed 1mg were administered as
conscious sedation during continuous monitoring of the patient's
level of consciousness and physiological / cardiorespiratory status
by the radiology RN, with a total moderate sedation time of 10
minutes. A 17 gauge trocar needle was advanced under ultrasound
guidance into the liver. 3 coaxial [X0] core samples were then
obtained through the guide needle. The guide needle was removed.
Post procedure scans demonstrate no apparent complication.

COMPLICATIONS:
COMPLICATIONS
None immediate
FINDINGS: Survey ultrasound shows no focal liver lesion or biliary ductal
dilatation. Representative core biopsy samples obtained under
ultrasound guidance.
IMPRESSION: 1. Technically successful ultrasound guided core liver biopsy.

## 2020-01-12 MED ORDER — SODIUM CHLORIDE 0.9 % IV SOLN: INTRAVENOUS | Status: DC

## 2020-01-12 MED ORDER — FENTANYL CITRATE (PF) 100 MCG/2ML IJ SOLN
INTRAMUSCULAR | Status: AC | PRN
  Administered 2020-01-12: 50 ug via INTRAVENOUS

## 2020-01-12 MED ORDER — MIDAZOLAM HCL 2 MG/2ML IJ SOLN
INTRAMUSCULAR | Status: AC
  Filled 2020-01-12: qty 2

## 2020-01-12 MED ORDER — MIDAZOLAM HCL 2 MG/2ML IJ SOLN
INTRAMUSCULAR | Status: AC | PRN
  Administered 2020-01-12: 1 mg via INTRAVENOUS

## 2020-01-12 MED ORDER — FENTANYL CITRATE (PF) 100 MCG/2ML IJ SOLN
INTRAMUSCULAR | Status: AC
  Filled 2020-01-12: qty 2

## 2020-01-12 NOTE — Procedures (Signed)
  Procedure: [place] core liver biopsy   EBL:   minimal Complications:  none immediate  See full dictation in [employer].  Dillard Cannon MD Main # [phone] Pager  [phone]

## 2020-01-12 NOTE — Discharge Instructions (Signed)
Liver Biopsy, Care After These instructions give you information about how to care for yourself after your procedure. Your health care provider may also give you more specific instructions. If you have problems or questions, contact your health care provider. What can I expect after the procedure? After your procedure, it is common to have:  Pain and soreness in the area where the biopsy was done.  Bruising around the area where the biopsy was done.  Sleepiness and fatigue for 1-2 days. Follow these instructions at home: Medicines  Take over-the-counter and prescription medicines only as told by your health care provider.  If you were prescribed an antibiotic medicine, take it as told by your health care provider. Do not stop taking the antibiotic even if you start to feel better.  Do not take medicines such as aspirin and ibuprofen unless your health care provider tells you to take them. These medicines thin your blood and can increase the risk of bleeding.  If you are taking prescription pain medicine, take actions to prevent or treat constipation. Your health care provider may recommend that you: ? Drink enough fluid to keep your urine pale yellow. ? Eat foods that are high in fiber, such as fresh fruits and vegetables, whole grains, and beans. ? Limit foods that are high in fat and processed sugars, such as fried or sweet foods. ? Take an over-the-counter or prescription medicine for constipation. Incision care ? Wash your hands with soap and water before you change your bandage (dressing). If soap and water are not available, use hand sanitizer. ? Change your bandage tomorrow after you shower and then remove the next day.  Check your incision area every day for signs of infection. Check for: ? Redness, swelling, or pain. ? Fluid or blood. ? Warmth. ? Pus or a bad smell. You may shower tomorrow  No lifting more than 5 lbs for 3 days  Return to your normal activities as told  by your health care provider.   Do not drive or use heavy machinery for 24hrs or while taking prescription pain medicine.  Do not play contact sports for 2 weeks after the procedure. General instructions   Do not drink alcohol in the first week after the procedure.  Have someone stay with you for at least 24 hours after the procedure.  It is your responsibility to obtain your test results. Ask your health care provider, or the department that is doing the test: ? When will my results be ready? ? How will I get my results? ? What are my treatment options? ? What other tests do I need? ? What are my next steps?  Keep all follow-up visits as told by your health care provider. This is important. Contact a health care provider if:  You have increased bleeding from an incision, resulting in more than a small spot of blood.  You have redness, swelling, or increasing pain in any incisions.  You notice a discharge or a bad smell coming from any of your incisions.  You have a fever or chills. Get help right away if:  You develop swelling, bloating, or pain in your abdomen.  You become dizzy or faint.  You develop a rash.  You have nausea or you vomit.  You faint, or you have shortness of breath or difficulty breathing.  You develop chest pain.  You have problems with your speech or vision.  You have trouble with your balance or moving your arms or legs. Summary  After the liver biopsy, it is common to have pain, soreness, and bruising in the area, as well as sleepiness and fatigue.  Take over-the-counter and prescription medicines only as told by your health care provider.  Follow instructions from your health care provider about how to care for your incision. Check the incision area daily for signs of infection. This information is not intended to replace advice given to you by your health care provider. Make sure you discuss any questions you have with your health care  provider. Document Released: 06/27/2005 Document Revised: 01/31/2019 Document Reviewed: 12/18/2017 Elsevier Patient Education  2020 [employer].

## 2020-01-16 LAB — SURGICAL PATHOLOGY

## 2020-02-16 ENCOUNTER — Observation Stay
Admission: EM | Admit: 2020-02-16 | Discharge: 2020-02-17 | Disposition: A | Discharge status: Home / Self Care | Payer: Medicare HMO | Attending: Internal Medicine | Admitting: Internal Medicine

## 2020-02-16 ENCOUNTER — Other Ambulatory Visit: Payer: Self-pay

## 2020-02-16 DIAGNOSIS — R0682 Tachypnea, not elsewhere classified: Secondary | ICD-10-CM | POA: Diagnosis not present

## 2020-02-16 DIAGNOSIS — R2681 Unsteadiness on feet: Secondary | ICD-10-CM | POA: Diagnosis not present

## 2020-02-16 DIAGNOSIS — E1010 Type 1 diabetes mellitus with ketoacidosis without coma: Secondary | ICD-10-CM | POA: Diagnosis not present

## 2020-02-16 DIAGNOSIS — I129 Hypertensive chronic kidney disease with stage 1 through stage 4 chronic kidney disease, or unspecified chronic kidney disease: Secondary | ICD-10-CM | POA: Insufficient documentation

## 2020-02-16 DIAGNOSIS — D696 Thrombocytopenia, unspecified: Secondary | ICD-10-CM

## 2020-02-16 DIAGNOSIS — E1110 Type 2 diabetes mellitus with ketoacidosis without coma: Secondary | ICD-10-CM | POA: Diagnosis present

## 2020-02-16 DIAGNOSIS — E1029 Type 1 diabetes mellitus with other diabetic kidney complication: Secondary | ICD-10-CM | POA: Diagnosis present

## 2020-02-16 DIAGNOSIS — R000 Tachycardia, unspecified: Secondary | ICD-10-CM | POA: Diagnosis not present

## 2020-02-16 DIAGNOSIS — E875 Hyperkalemia: Secondary | ICD-10-CM | POA: Diagnosis not present

## 2020-02-16 DIAGNOSIS — Z794 Long term (current) use of insulin: Secondary | ICD-10-CM | POA: Diagnosis not present

## 2020-02-16 DIAGNOSIS — E1022 Type 1 diabetes mellitus with diabetic chronic kidney disease: Secondary | ICD-10-CM | POA: Diagnosis not present

## 2020-02-16 DIAGNOSIS — D638 Anemia in other chronic diseases classified elsewhere: Secondary | ICD-10-CM

## 2020-02-16 DIAGNOSIS — Z20822 Contact with and (suspected) exposure to covid-19: Secondary | ICD-10-CM | POA: Insufficient documentation

## 2020-02-16 DIAGNOSIS — I10 Essential (primary) hypertension: Secondary | ICD-10-CM | POA: Diagnosis present

## 2020-02-16 DIAGNOSIS — E1065 Type 1 diabetes mellitus with hyperglycemia: Secondary | ICD-10-CM | POA: Diagnosis present

## 2020-02-16 DIAGNOSIS — I639 Cerebral infarction, unspecified: Secondary | ICD-10-CM | POA: Diagnosis present

## 2020-02-16 DIAGNOSIS — N179 Acute kidney failure, unspecified: Secondary | ICD-10-CM | POA: Diagnosis present

## 2020-02-16 DIAGNOSIS — N184 Chronic kidney disease, stage 4 (severe): Secondary | ICD-10-CM | POA: Diagnosis not present

## 2020-02-16 DIAGNOSIS — K7469 Other cirrhosis of liver: Secondary | ICD-10-CM

## 2020-02-16 DIAGNOSIS — Z79899 Other long term (current) drug therapy: Secondary | ICD-10-CM | POA: Insufficient documentation

## 2020-02-16 DIAGNOSIS — Z8673 Personal history of transient ischemic attack (TIA), and cerebral infarction without residual deficits: Secondary | ICD-10-CM | POA: Diagnosis not present

## 2020-02-16 DIAGNOSIS — Z9114 Patient's other noncompliance with medication regimen: Secondary | ICD-10-CM | POA: Diagnosis not present

## 2020-02-16 DIAGNOSIS — Z7982 Long term (current) use of aspirin: Secondary | ICD-10-CM | POA: Insufficient documentation

## 2020-02-16 DIAGNOSIS — IMO0002 Reserved for concepts with insufficient information to code with codable children: Secondary | ICD-10-CM | POA: Diagnosis present

## 2020-02-16 LAB — COMPREHENSIVE METABOLIC PANEL
ALT: 105 U/L — ABNORMAL HIGH (ref 0–44)
AST: 35 U/L (ref 15–41)
Albumin: 3.8 g/dL (ref 3.5–5.0)
Alkaline Phosphatase: 478 U/L — ABNORMAL HIGH (ref 38–126)
Anion gap: 19 — ABNORMAL HIGH (ref 5–15)
BUN: 92 mg/dL — ABNORMAL HIGH (ref 6–20)
CO2: 24 mmol/L (ref 22–32)
Calcium: 9.3 mg/dL (ref 8.9–10.3)
Chloride: 83 mmol/L — ABNORMAL LOW (ref 98–111)
Creatinine, Ser: 2.28 mg/dL — ABNORMAL HIGH (ref 0.44–1.00)
GFR calc Af Amer: 26 mL/min — ABNORMAL LOW (ref >60)
GFR calc non Af Amer: 23 mL/min — ABNORMAL LOW (ref >60)
Glucose, Bld: 1183 mg/dL (ref 70–99)
Potassium: 5.7 mmol/L — ABNORMAL HIGH (ref 3.5–5.1)
Sodium: 126 mmol/L — ABNORMAL LOW (ref 135–145)
Total Bilirubin: 1.3 mg/dL — ABNORMAL HIGH (ref 0.3–1.2)
Total Protein: 6.9 g/dL (ref 6.5–8.1)

## 2020-02-16 LAB — LIPASE, BLOOD: Lipase: 59 U/L — ABNORMAL HIGH (ref 11–51)

## 2020-02-16 LAB — CBC WITH DIFFERENTIAL/PLATELET
Abs Immature Granulocytes: 0.02 10*3/uL (ref 0.00–0.07)
Basophils Absolute: 0 10*3/uL (ref 0.0–0.1)
Basophils Relative: 0 %
Eosinophils Absolute: 0 10*3/uL (ref 0.0–0.5)
Eosinophils Relative: 0 %
HCT: 32.2 % — ABNORMAL LOW (ref 36.0–46.0)
Hemoglobin: 10.3 g/dL — ABNORMAL LOW (ref 12.0–15.0)
Immature Granulocytes: 0 %
Lymphocytes Relative: 7 %
Lymphs Abs: 0.3 10*3/uL — ABNORMAL LOW (ref 0.7–4.0)
MCH: 30.7 pg (ref 26.0–34.0)
MCHC: 32 g/dL (ref 30.0–36.0)
MCV: 95.8 fL (ref 80.0–100.0)
Monocytes Absolute: 0.1 10*3/uL (ref 0.1–1.0)
Monocytes Relative: 3 %
Neutro Abs: 4.4 10*3/uL (ref 1.7–7.7)
Neutrophils Relative %: 90 %
Platelets: 90 10*3/uL — ABNORMAL LOW (ref 150–400)
RBC: 3.36 MIL/uL — ABNORMAL LOW (ref 3.87–5.11)
RDW: 12.4 % (ref 11.5–15.5)
WBC: 4.9 10*3/uL (ref 4.0–10.5)
nRBC: 0 % (ref 0.0–0.2)

## 2020-02-16 LAB — URINALYSIS, COMPLETE (UACMP) WITH MICROSCOPIC
Bilirubin Urine: NEGATIVE
Glucose, UA: >=500 mg/dL — AB
Hgb urine dipstick: NEGATIVE
Ketones, ur: NEGATIVE mg/dL
Leukocytes,Ua: NEGATIVE
Nitrite: NEGATIVE
Protein, ur: NEGATIVE mg/dL
Specific Gravity, Urine: 1.018 (ref 1.005–1.030)
Squamous Epithelial / HPF: NONE SEEN (ref 0–5)
pH: 5 (ref 5.0–8.0)

## 2020-02-16 LAB — GLUCOSE, CAPILLARY
Glucose-Capillary: >600 mg/dL (ref 70–99)
Glucose-Capillary: >600 mg/dL (ref 70–99)
Glucose-Capillary: >600 mg/dL (ref 70–99)
Glucose-Capillary: >600 mg/dL (ref 70–99)
Glucose-Capillary: >600 mg/dL (ref 70–99)
Glucose-Capillary: >600 mg/dL (ref 70–99)
Glucose-Capillary: 586 mg/dL (ref 70–99)
Glucose-Capillary: 590 mg/dL (ref 70–99)
Glucose-Capillary: 492 mg/dL — ABNORMAL HIGH (ref 70–99)
Glucose-Capillary: 563 mg/dL (ref 70–99)
Glucose-Capillary: 578 mg/dL (ref 70–99)

## 2020-02-16 LAB — BASIC METABOLIC PANEL
Anion gap: 13 (ref 5–15)
BUN: 85 mg/dL — ABNORMAL HIGH (ref 6–20)
CO2: 26 mmol/L (ref 22–32)
Calcium: 9.5 mg/dL (ref 8.9–10.3)
Chloride: 91 mmol/L — ABNORMAL LOW (ref 98–111)
Creatinine, Ser: 2.2 mg/dL — ABNORMAL HIGH (ref 0.44–1.00)
GFR calc Af Amer: 28 mL/min — ABNORMAL LOW (ref >60)
GFR calc non Af Amer: 24 mL/min — ABNORMAL LOW (ref >60)
Glucose, Bld: 838 mg/dL (ref 70–99)
Potassium: 5.2 mmol/L — ABNORMAL HIGH (ref 3.5–5.1)
Sodium: 130 mmol/L — ABNORMAL LOW (ref 135–145)

## 2020-02-16 LAB — AMMONIA: Ammonia: 26 umol/L (ref 9–35)

## 2020-02-16 LAB — PROTIME-INR
INR: 1.1 (ref 0.8–1.2)
Prothrombin Time: 13.6 seconds (ref 11.4–15.2)

## 2020-02-16 LAB — BETA-HYDROXYBUTYRIC ACID: Beta-Hydroxybutyric Acid: 4.07 mmol/L — ABNORMAL HIGH (ref 0.05–0.27)

## 2020-02-16 LAB — SARS CORONAVIRUS 2 (TAT 6-24 HRS): SARS Coronavirus 2: NEGATIVE

## 2020-02-16 LAB — APTT: aPTT: <24 seconds — ABNORMAL LOW (ref 24–36)

## 2020-02-16 LAB — OSMOLALITY: Osmolality: 361 mOsm/kg (ref 275–295)

## 2020-02-16 LAB — MRSA PCR SCREENING: MRSA by PCR: NEGATIVE

## 2020-02-16 MED ORDER — METOPROLOL TARTRATE 25 MG PO TABS
25.0000 mg | ORAL_TABLET | Freq: Two times a day (BID) | ORAL | Status: DC
  Administered 2020-02-16: 25 mg via ORAL
  Filled 2020-02-16: qty 1

## 2020-02-16 MED ORDER — LACTATED RINGERS IV SOLN: INTRAVENOUS | Status: DC

## 2020-02-16 MED ORDER — DEXTROSE 50 % IV SOLN: 0.0000 mL | INTRAVENOUS | Status: DC | PRN

## 2020-02-16 MED ORDER — ONDANSETRON HCL 4 MG/2ML IJ SOLN
4.0000 mg | Freq: Once | INTRAMUSCULAR | Status: AC
  Administered 2020-02-16: 4 mg via INTRAVENOUS
  Filled 2020-02-16: qty 2

## 2020-02-16 MED ORDER — INSULIN REGULAR(HUMAN) IN NACL 100-0.9 UT/100ML-% IV SOLN
INTRAVENOUS | Status: DC
  Administered 2020-02-16: 4.8 [IU]/h via INTRAVENOUS
  Filled 2020-02-16: qty 100

## 2020-02-16 MED ORDER — SODIUM CHLORIDE 0.9 % IV SOLN: INTRAVENOUS | Status: DC

## 2020-02-16 MED ORDER — SODIUM CHLORIDE 0.9 % IV BOLUS
1000.0000 mL | Freq: Once | INTRAVENOUS | Status: AC
  Administered 2020-02-16: 1000 mL via INTRAVENOUS

## 2020-02-16 MED ORDER — LACTATED RINGERS IV BOLUS: 1000.0000 mL | Freq: Once | INTRAVENOUS | Status: DC

## 2020-02-16 MED ORDER — LACTATED RINGERS IV BOLUS
1000.0000 mL | Freq: Once | INTRAVENOUS | Status: AC
  Administered 2020-02-16: 1000 mL via INTRAVENOUS

## 2020-02-16 MED ORDER — HYDRALAZINE HCL 50 MG PO TABS: 25.0000 mg | ORAL_TABLET | Freq: Three times a day (TID) | ORAL | Status: DC | PRN

## 2020-02-16 MED ORDER — ONDANSETRON HCL 4 MG/2ML IJ SOLN: 4.0000 mg | Freq: Three times a day (TID) | INTRAMUSCULAR | Status: DC | PRN

## 2020-02-16 MED ORDER — ASPIRIN EC 81 MG PO TBEC
81.0000 mg | DELAYED_RELEASE_TABLET | Freq: Every day | ORAL | Status: DC
  Administered 2020-02-17: 09:00:00 81 mg via ORAL
  Filled 2020-02-16: qty 1

## 2020-02-16 MED ORDER — LACTATED RINGERS IV BOLUS
1000.0000 mL | INTRAVENOUS | Status: AC
  Administered 2020-02-16: 1000 mL via INTRAVENOUS

## 2020-02-16 MED ORDER — DEXTROSE IN LACTATED RINGERS 5 % IV SOLN: INTRAVENOUS | Status: DC

## 2020-02-16 MED ORDER — ACETAMINOPHEN 325 MG PO TABS: 650.0000 mg | ORAL_TABLET | Freq: Four times a day (QID) | ORAL | Status: DC | PRN

## 2020-02-16 MED ORDER — AMLODIPINE BESYLATE 5 MG PO TABS: 5.0000 mg | ORAL_TABLET | Freq: Every day | ORAL | Status: DC

## 2020-02-16 MED ORDER — DEXTROSE-NACL 5-0.45 % IV SOLN: INTRAVENOUS | Status: DC

## 2020-02-16 MED ORDER — INSULIN REGULAR(HUMAN) IN NACL 100-0.9 UT/100ML-% IV SOLN
INTRAVENOUS | Status: DC
  Administered 2020-02-16: 4.8 [IU]/h via INTRAVENOUS

## 2020-02-16 MED ORDER — SODIUM CHLORIDE 0.9 % IV BOLUS: 1000.0000 mL | INTRAVENOUS | Status: DC

## 2020-02-16 NOTE — ED Notes (Signed)
This RN attempted secondary IV access to administer fluids with no success.

## 2020-02-16 NOTE — ED Triage Notes (Signed)
Pt comes into the ED via EMS from home with c/o running out of her lantus, last does was yesterday and feeling weak and nauseous today. Pt is in NAD.Marland Kitchen

## 2020-02-16 NOTE — H&P (Signed)
History and Physical    Susan Navarro ENM:[id number] DOB: 1961-11-29 DOA: 02/16/2020  Referring MD/NP/PA:   PCP: Rochel Brome, MD   Patient coming from:  The patient is coming from home.  At baseline, pt is independent for most of ADL.        Chief Complaint: Generalized weakness  HPI: Susan Navarro is a 59 y.o. female with medical history significant of type 1 diabetes, hypertension, stroke, CKD-4, anemia, thrombocytopenia, liver cirrhosis, who presents with generalized weakness.  Patient states that ran out of her Lantus in the past 2 days ago. She feels fatigued and has generalized weakness.  She has nausea, poor appetite and poor oral intake.  No vomiting, diarrhea or abdominal pain.  Patient does not have chest pain, cough, shortness breath.  No fever or chills.  No symptoms of UTI.  Denies polyuria or dysuria.  ED Course: pt was found to have DKA (blood sugar 1183, bicarbonate 24, anion gap 19, beta hydroxybutyric acid 4.07), WBC 4.9, pending Covid PCR test, potassium 5.7, pseudohyponatremia, renal function close to baseline, temperature normal, blood pressure 115/49, tachycardia, tachypnea, oxygen saturation 98% on room air.  Patient is admitted to stepdown as inpatient.  Review of Systems:   General: no fevers, chills, no body weight gain, has poor appetite, has fatigue HEENT: no blurry vision, hearing changes or sore throat Respiratory: no dyspnea, coughing, wheezing CV: no chest pain, no palpitations GI: has nausea, no vomiting, abdominal pain, diarrhea, constipation GU: no dysuria, burning on urination, increased urinary frequency, hematuria  Ext: no leg edema Neuro: no unilateral weakness, numbness, or tingling, no vision change or hearing loss Skin: no rash, no skin tear. MSK: No muscle spasm, no deformity, no limitation of range of movement in spin Heme: No easy bruising.  Travel history: No recent long distant travel.  Allergy: No Known Allergies  Past  Medical History:  Diagnosis Date  . Anemia   . Chronic kidney disease   . Diabetes mellitus without complication ([place])   . Hypertension   . Stroke [facility] 03/2018   Ischemic stroke per [facility] records    History reviewed. No pertinent surgical history.  Social History:  reports that she has never smoked. She has never used smokeless tobacco. She reports that she does not drink alcohol or use drugs.  Family History:  Family History  Problem Relation Age of Onset  . Diabetes Mother   . Diabetes Brother      Prior to Admission medications   Medication Sig Start Date End Date Taking? Authorizing Provider  amLODipine (NORVASC) 5 MG tablet Take 5 mg by mouth daily. 12/15/19   [provider]  aspirin EC 81 MG tablet Take 81 mg by mouth daily. 02/09/17   [provider]  Ensure Max Protein (ENSURE MAX PROTEIN) LIQD Take 330 mLs (11 oz total) by mouth 2 (two) times daily between meals. 12/30/19   Dhungel, Flonnie Overman, MD  Insulin Glargine (LANTUS SOLOSTAR) 100 UNIT/ML Solostar Pen Inject 20 Units into the skin at bedtime. 12/30/19   Dhungel, Nishant, MD  insulin lispro (HUMALOG KWIKPEN) 100 UNIT/ML KwikPen Inject 0.1 mLs (10 Units total) into the skin 3 (three) times daily before meals. 10/08/19 10/07/20  Fritzi Mandes, MD  lisinopril (ZESTRIL) 2.5 MG tablet Take 2.5 mg by mouth every evening.     [provider]  metoprolol tartrate (LOPRESSOR) 25 MG tablet Take 25 mg by mouth 2 (two) times daily.     [provider]    Physical  Exam: Vitals:   02/16/20 1415 02/16/20 1430 02/16/20 1638 02/16/20 1750  BP:  (!) 115/49 (!) 139/58 (!) 154/65  Pulse: (!) 107  (!) 105 100  Resp: (!) 22 14 18 16   Temp:    98.9 F (37.2 C)  TempSrc:    Oral  SpO2: 98%  97% 97%  Weight:      Height:    5' 6"  (1.676 m)   General: Not in acute distress.  Dry mucosal membrane HEENT:       Eyes: PERRL, EOMI, no scleral icterus.       ENT: No discharge from the ears and nose, no pharynx  injection, no tonsillar enlargement.        Neck: No JVD, no bruit, no mass felt. Heme: No neck lymph node enlargement. Cardiac: S1/S2, RRR, No murmurs, No gallops or rubs. Respiratory: No rales, wheezing, rhonchi or rubs. GI: Soft, nondistended, nontender, no rebound pain, no organomegaly, BS present. GU: No hematuria Ext: No pitting leg edema bilaterally. 2+DP/PT pulse bilaterally. Musculoskeletal: No joint deformities, No joint redness or warmth, no limitation of ROM in spin. Skin: No rashes.  Neuro: Alert, oriented X3, cranial nerves II-XII grossly intact, moves all extremities normally.  Psych: Patient is not psychotic, no suicidal or hemocidal ideation.  Labs on Admission: I have personally reviewed following labs and imaging studies  CBC: Recent Labs  Lab 02/16/20 1409  WBC 4.9  NEUTROABS 4.4  HGB 10.3*  HCT 32.2*  MCV 95.8  PLT 90*   Basic Metabolic Panel: Recent Labs  Lab 02/16/20 1409  NA 126*  K 5.7*  CL 83*  CO2 24  GLUCOSE 1,183*  BUN 92*  CREATININE 2.28*  CALCIUM 9.3   GFR: Estimated Creatinine Clearance: 22.8 mL/min (A) (by C-G formula based on SCr of 2.28 mg/dL (H)). Liver Function Tests: Recent Labs  Lab 02/16/20 1409  AST 35  ALT 105*  ALKPHOS 478*  BILITOT 1.3*  PROT 6.9  ALBUMIN 3.8   Recent Labs  Lab 02/16/20 1409  LIPASE 59*   No results for input(s): AMMONIA in the last 168 hours. Coagulation Profile: No results for input(s): INR, PROTIME in the last 168 hours. Cardiac Enzymes: No results for input(s): CKTOTAL, CKMB, CKMBINDEX, TROPONINI in the last 168 hours. BNP (last 3 results) No results for input(s): PROBNP in the last 8760 hours. HbA1C: No results for input(s): HGBA1C in the last 72 hours. CBG: Recent Labs  Lab 02/16/20 1531 02/16/20 1630  GLUCAP >600* >600*   Lipid Profile: No results for input(s): CHOL, HDL, LDLCALC, TRIG, CHOLHDL, LDLDIRECT in the last 72 hours. Thyroid Function Tests: No results for  input(s): TSH, T4TOTAL, FREET4, T3FREE, THYROIDAB in the last 72 hours. Anemia Panel: No results for input(s): VITAMINB12, FOLATE, FERRITIN, TIBC, IRON, RETICCTPCT in the last 72 hours. Urine analysis:    Component Value Date/Time   COLORURINE YELLOW (A) 12/26/2019 0414   APPEARANCEUR CLEAR (A) 12/26/2019 0414   LABSPEC 1.016 12/26/2019 0414   PHURINE 5.0 12/26/2019 0414   GLUCOSEU >=500 (A) 12/26/2019 0414   HGBUR NEGATIVE 12/26/2019 0414   BILIRUBINUR NEGATIVE 12/26/2019 0414   KETONESUR 5 (A) 12/26/2019 0414   PROTEINUR NEGATIVE 12/26/2019 0414   NITRITE NEGATIVE 12/26/2019 0414   LEUKOCYTESUR NEGATIVE 12/26/2019 0414   Sepsis Labs: @LABRCNTIP (procalcitonin:4,lacticidven:4) )No results found for this or any previous visit (from the past 240 hour(s)).   Radiological Exams on Admission: No results found.   EKG: Independently reviewed.  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  Baseline creatinine 1.7-2.3.  Her creatinine is 2.28, BUN 92 -Follow-up of a BMP  Other cirrhosis of liver ([place]): AST 35, ALT 105, total bilirubin 1.3, ALP 478 -check INR and PTT -Check  ammonia level  Thrombocytopenia ([place]): Likely due to liver cirrhosis.  Platelets at 90.  No active bleeding. 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 Patient is taking Humalog, and Lantus at home, but not compliance to taking medications. -on DKA protocol  Stroke [facility]): -ASA  HTN: Blood pressure 115/49 -Continue home medications: Amlodipine, metoprolol -Hold lisinopril -hydralazine prn    Inpatient status:  # Patient requires inpatient status due to high intensity of service, high risk for further deterioration and high frequency of surveillance required.  I certify that at the point of admission it is my clinical judgment that the patient will require inpatient hospital care spanning beyond 2 midnights from the point of admission.  . This patient has multiple chronic comorbidities including type 1 diabetes, hypertension, stroke, CKD-4, anemia, thrombocytopenia, liver cirrhosis . Now patient has presenting with DKA . The worrisome physical exam findings include dry mucus and membrane . The initial radiographic and laboratory data are worrisome because of blood sugar 1183, bicarbonate 24, anion gap 19, beta hydroxybutyric acid 4.07 . Current medical needs: please see my assessment and plan . Predictability of an adverse outcome (risk): Patient has multiple comorbidities as listed above. Now presents with DKA with blood sugar 1183, bicarbonate 24, anion gap 19, beta hydroxybutyric acid 4.07. Patient's presentation is highly complicated.  Patient is at high risk of deteriorating.  Will need to be treated in hospital for at least 2 days.      DVT ppx: SCD Code Status: Full code Family Communication: not done, no family member is at bed side.     Disposition Plan:  Anticipate discharge back to previous home environment Consults called:  none Admission status: SDU/inpation       Date of  Service 02/16/2020    [place]   If 7PM-7AM, please contact night-coverage [email or web address] 02/16/2020, 6:39 PM

## 2020-02-16 NOTE — ED Provider Notes (Signed)
[facility] Emergency Department Provider Note  ____________________________________________  Time seen: Approximately 3:30 PM  I have reviewed the triage vital signs and the nursing notes.   HISTORY  Chief Complaint Hyperglycemia    HPI Susan Navarro is a 59 y.o. female with a history of CKD diabetes hypertension and stroke who comes to the ED feeling weak and nauseated today.  Feels fatigued but denies any focal pain shortness of breath headache or vision changes.  No falls or trauma.  Symptoms are constant without aggravating or alleviating factors.  Ran out of her Lantus 1 to 2 days ago.  Denies polyuria or dysuria.      Past Medical History:  Diagnosis Date  . Anemia   . Chronic kidney disease   . Diabetes mellitus without complication ([place])   . Hypertension   . Stroke [facility]) 03/2018   Ischemic stroke per [facility] records     Patient Active Problem List   Diagnosis Date Noted  . Acute renal failure superimposed on stage 4 chronic kidney disease ([place]) 12/30/2019  . Other cirrhosis of liver ([place]) 12/30/2019  . Thrombocytopenia ([place]) 12/30/2019  . 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 Transaminitis 12/30/2019  . Malnutrition of moderate degree 12/28/2019  . Anemia of chronic disease   . Generalized weakness   . Altered mental status   . DKA (diabetic ketoacidoses) ([place]) 12/25/2019  . Hyponatremia   . Hyperkalemia      History reviewed. No pertinent surgical history.   Prior to Admission medications   Medication Sig Start Date End Date Taking? Authorizing Provider  amLODipine (NORVASC) 5 MG tablet Take 5 mg by mouth daily. 12/15/19   [provider]  aspirin EC 81 MG tablet Take 81 mg by mouth daily. 02/09/17   [provider]  Ensure Max Protein (ENSURE MAX PROTEIN) LIQD Take 330 mLs (11 oz total) by mouth 2 (two) times daily between meals. 12/30/19   Dhungel, Flonnie Overman, MD  Insulin  Glargine (LANTUS SOLOSTAR) 100 UNIT/ML Solostar Pen Inject 20 Units into the skin at bedtime. 12/30/19   Dhungel, Nishant, MD  insulin lispro (HUMALOG KWIKPEN) 100 UNIT/ML KwikPen Inject 0.1 mLs (10 Units total) into the skin 3 (three) times daily before meals. 10/08/19 10/07/20  Fritzi Mandes, MD  lisinopril (ZESTRIL) 2.5 MG tablet Take 2.5 mg by mouth every evening.     [provider]  metoprolol tartrate (LOPRESSOR) 25 MG tablet Take 25 mg by mouth 2 (two) times daily.     [provider]     Allergies Patient has no known allergies.   Family History  Problem Relation Age of Onset  . Diabetes Mother   . Diabetes Brother     Social History Social History   Tobacco Use  . Smoking status: Never Smoker  . Smokeless tobacco: Never Used  Substance Use Topics  . Alcohol use: No  . Drug use: Never    Review of Systems  Constitutional:   No fever or chills.  ENT:   No sore throat. No rhinorrhea. Cardiovascular:   No chest pain or syncope. Respiratory:   No dyspnea or cough. Gastrointestinal:   Negative for abdominal pain, vomiting and diarrhea.  Musculoskeletal:   Negative for focal pain or swelling All other systems reviewed and are negative except as documented above in ROS and HPI.  ____________________________________________   PHYSICAL EXAM:  VITAL SIGNS: ED Triage Vitals [02/16/20 1358]  Enc Vitals Group  BP (!) 130/49     Pulse Rate 99     Resp 17     Temp 98.2 F (36.8 C)     Temp Source Oral     SpO2 99 %     Weight 120 lb (54.4 kg)     Height 5' 6"  (1.676 m)     Head Circumference      Peak Flow      Pain Score 0     Pain Loc      Pain Edu?      Excl. in [place]?     Vital signs reviewed, nursing assessments reviewed.   Constitutional:   Alert and oriented. Non-toxic appearance. Eyes:   Conjunctivae are normal. EOMI. PERRL. ENT      Head:   Normocephalic and atraumatic.      Nose:   Wearing a mask.      Mouth/Throat:   Wearing a  mask.      Neck:   No meningismus. Full ROM. Hematological/Lymphatic/Immunilogical:   No cervical lymphadenopathy. Cardiovascular:   Tachycardia heart rate 105. Symmetric bilateral radial and DP pulses.  No murmurs. Cap refill less than 2 seconds. Respiratory:   Normal respiratory effort without tachypnea/retractions. Breath sounds are clear and equal bilaterally. No wheezes/rales/rhonchi. Gastrointestinal:   Soft and nontender. Non distended. There is no CVA tenderness.  No rebound, rigidity, or guarding. Musculoskeletal:   Normal range of motion in all extremities. No joint effusions.  No lower extremity tenderness.  No edema. Neurologic:   Normal speech and language.  Motor grossly intact. No acute focal neurologic deficits are appreciated.  Skin:    Skin is warm, dry and intact. No rash noted.  No petechiae, purpura, or bullae.  ____________________________________________    LABS (pertinent positives/negatives) (all labs ordered are listed, but only abnormal results are displayed) Labs Reviewed  COMPREHENSIVE METABOLIC PANEL - Abnormal; Notable for the following components:      Result Value   Sodium 126 (*)    Potassium 5.7 (*)    Chloride 83 (*)    Glucose, Bld 1,183 (*)    BUN 92 (*)    Creatinine, Ser 2.28 (*)    ALT 105 (*)    Alkaline Phosphatase 478 (*)    Total Bilirubin 1.3 (*)    GFR calc non Af Amer 23 (*)    GFR calc Af Amer 26 (*)    Anion gap 19 (*)    All other components within normal limits  LIPASE, BLOOD - Abnormal; Notable for the following components:   Lipase 59 (*)    All other components within normal limits  CBC WITH DIFFERENTIAL/PLATELET - Abnormal; Notable for the following components:   RBC 3.36 (*)    Hemoglobin 10.3 (*)    HCT 32.2 (*)    Platelets 90 (*)    Lymphs Abs 0.3 (*)    All other components within normal limits  SARS CORONAVIRUS 2 (TAT 6-24 HRS)  URINALYSIS, COMPLETE (UACMP) WITH MICROSCOPIC  OSMOLALITY  BETA-HYDROXYBUTYRIC  ACID   ____________________________________________   EKG  Interpreted by me Sinus tachycardia rate 114.  Normal axis and intervals.  Normal QRS ST segments and T waves.  ____________________________________________    RADIOLOGY  No results found.  ____________________________________________   PROCEDURES .Critical Care Performed by: Carrie Mew, MD Authorized by: Carrie Mew, MD   Critical care provider statement:    Critical care time (minutes):  35   Critical care time was exclusive of:  Separately  billable procedures and treating other patients   Critical care was necessary to treat or prevent imminent or life-threatening deterioration of the following conditions:  Endocrine crisis, metabolic crisis and dehydration   Critical care was time spent personally by me on the following activities:  Development of treatment plan with patient or surrogate, discussions with consultants, evaluation of patient's response to treatment, examination of patient, obtaining history from patient or surrogate, ordering and performing treatments and interventions, ordering and review of laboratory studies, ordering and review of radiographic studies, pulse oximetry, re-evaluation of patient's condition and review of old charts    ____________________________________________    CLINICAL IMPRESSION / [place] / ED COURSE  Medications ordered in the ED: Medications  insulin regular, human (MYXREDLIN) 100 units/ 100 mL infusion (has no administration in time range)  dextrose 50 % solution 0-50 mL (has no administration in time range)  lactated ringers infusion (has no administration in time range)  dextrose 5 % in lactated ringers infusion (has no administration in time range)  lactated ringers bolus 1,000 mL (has no administration in time range)  sodium chloride 0.9 % bolus 1,000 mL (0 mLs Intravenous Stopped 02/16/20 1529)  ondansetron (ZOFRAN) injection 4 mg (4 mg  Intravenous Given 02/16/20 1425)    Pertinent labs & imaging results that were available during my care of the patient were reviewed by me and considered in my medical decision making (see chart for details).  Susan Navarro was evaluated in Emergency Department on 02/16/2020 for the symptoms described in the history of present illness. She was evaluated in the context of the global COVID-19 pandemic, which necessitated consideration that the patient might be at risk for infection with the SARS-CoV-2 virus that causes COVID-19. Institutional protocols and algorithms that pertain to the evaluation of patients at risk for COVID-19 are in a state of rapid change based on information released by regulatory bodies including the CDC and federal and state organizations. These policies and algorithms were followed during the patient's care in the ED.   Patient presents with tachycardia and fatigue in the setting of running out of her insulin.  Glucometer at home read "high,", and we will check labs and give IV fluids for hydration.  Clinical Course as of Feb 15 1529  Thu Feb 16, 2020  1455 Chemistry panel shows glucose of 1200 with hyponatremia, anion gap of 19, AKI on CKD.  Suspect DKA.  Will start on insulin drip, continue IV fluids, plan to admit.   [PS]    Clinical Course User Index [PS] Carrie Mew, MD     ____________________________________________   FINAL CLINICAL IMPRESSION(S) / ED DIAGNOSES    Final diagnoses:  Type 2 diabetes mellitus with ketoacidosis without coma, with long-term current use of insulin [facility])     ED Discharge Orders    None      Portions of this note were generated with dragon dictation software. Dictation errors may occur despite best attempts at proofreading.   Carrie Mew, MD 02/16/20 [phone]

## 2020-02-16 NOTE — Progress Notes (Signed)
Patient in with DKA, and AKI , PMH T1DM, HTN, CVA, CKD 4, thrombocytopenia, anemia, and liver cirrhosis. Nurse reports serum osmolality of 361. Review of labs and previous treatments thus far, total 2 lite fluid bolus given.  Blood glucose remains above 600 and was over 1000 on presentation.  CBC with slight hemoconcentration when compared with patients normal values with her chronic anemia and thrombocytopenia.  Given additional I liter LR (corrected sodium on last labs available 143.) and increased maintenance fluid late to 100 until blood glucose reaches below 250. Endotool management for insulin infusion dosing. Serial metabolic panels,

## 2020-02-16 NOTE — ED Notes (Signed)
Attempted to call report. ICU will call back when ready for PT. Charge RN aware.

## 2020-02-16 NOTE — ED Notes (Signed)
[place] [facility] have both attempted for secondary IV access.

## 2020-02-17 DIAGNOSIS — E1010 Type 1 diabetes mellitus with ketoacidosis without coma: Secondary | ICD-10-CM | POA: Diagnosis not present

## 2020-02-17 LAB — BASIC METABOLIC PANEL
Anion gap: 11 (ref 5–15)
Anion gap: 13 (ref 5–15)
Anion gap: 8 (ref 5–15)
BUN: 70 mg/dL — ABNORMAL HIGH (ref 6–20)
BUN: 75 mg/dL — ABNORMAL HIGH (ref 6–20)
BUN: 76 mg/dL — ABNORMAL HIGH (ref 6–20)
CO2: 23 mmol/L (ref 22–32)
CO2: 27 mmol/L (ref 22–32)
CO2: 31 mmol/L (ref 22–32)
Calcium: 9.3 mg/dL (ref 8.9–10.3)
Calcium: 9.5 mg/dL (ref 8.9–10.3)
Calcium: 9.6 mg/dL (ref 8.9–10.3)
Chloride: 100 mmol/L (ref 98–111)
Chloride: 101 mmol/L (ref 98–111)
Chloride: 103 mmol/L (ref 98–111)
Creatinine, Ser: 1.81 mg/dL — ABNORMAL HIGH (ref 0.44–1.00)
Creatinine, Ser: 1.87 mg/dL — ABNORMAL HIGH (ref 0.44–1.00)
Creatinine, Ser: 1.94 mg/dL — ABNORMAL HIGH (ref 0.44–1.00)
GFR calc Af Amer: 32 mL/min — ABNORMAL LOW (ref >60)
GFR calc Af Amer: 34 mL/min — ABNORMAL LOW (ref >60)
GFR calc Af Amer: 35 mL/min — ABNORMAL LOW (ref >60)
GFR calc non Af Amer: 28 mL/min — ABNORMAL LOW (ref >60)
GFR calc non Af Amer: 29 mL/min — ABNORMAL LOW (ref >60)
GFR calc non Af Amer: 30 mL/min — ABNORMAL LOW (ref >60)
Glucose, Bld: 113 mg/dL — ABNORMAL HIGH (ref 70–99)
Glucose, Bld: 138 mg/dL — ABNORMAL HIGH (ref 70–99)
Glucose, Bld: 444 mg/dL — ABNORMAL HIGH (ref 70–99)
Potassium: 3.8 mmol/L (ref 3.5–5.1)
Potassium: 4.6 mmol/L (ref 3.5–5.1)
Potassium: 4.6 mmol/L (ref 3.5–5.1)
Sodium: 136 mmol/L (ref 135–145)
Sodium: 139 mmol/L (ref 135–145)
Sodium: 142 mmol/L (ref 135–145)

## 2020-02-17 LAB — GLUCOSE, CAPILLARY
Glucose-Capillary: 111 mg/dL — ABNORMAL HIGH (ref 70–99)
Glucose-Capillary: 127 mg/dL — ABNORMAL HIGH (ref 70–99)
Glucose-Capillary: 138 mg/dL — ABNORMAL HIGH (ref 70–99)
Glucose-Capillary: 171 mg/dL — ABNORMAL HIGH (ref 70–99)
Glucose-Capillary: 217 mg/dL — ABNORMAL HIGH (ref 70–99)
Glucose-Capillary: 313 mg/dL — ABNORMAL HIGH (ref 70–99)
Glucose-Capillary: 34 mg/dL — CL (ref 70–99)
Glucose-Capillary: 384 mg/dL — ABNORMAL HIGH (ref 70–99)
Glucose-Capillary: 55 mg/dL — ABNORMAL LOW (ref 70–99)
Glucose-Capillary: 63 mg/dL — ABNORMAL LOW (ref 70–99)
Glucose-Capillary: 70 mg/dL (ref 70–99)
Glucose-Capillary: 90 mg/dL (ref 70–99)
Glucose-Capillary: >600 mg/dL (ref 70–99)

## 2020-02-17 LAB — MAGNESIUM: Magnesium: 1.8 mg/dL (ref 1.7–2.4)

## 2020-02-17 LAB — HEMOGLOBIN A1C
Hgb A1c MFr Bld: 14 % — ABNORMAL HIGH (ref 4.8–5.6)
Mean Plasma Glucose: 355.1 mg/dL

## 2020-02-17 MED ORDER — INSULIN ASPART 100 UNIT/ML ~~LOC~~ SOLN
0.0000 [IU] | Freq: Three times a day (TID) | SUBCUTANEOUS | Status: DC
  Administered 2020-02-17: 1 [IU] via SUBCUTANEOUS
  Filled 2020-02-17: qty 1

## 2020-02-17 MED ORDER — POTASSIUM CHLORIDE 10 MEQ/100ML IV SOLN
10.0000 meq | INTRAVENOUS | Status: AC
  Administered 2020-02-17 (×4): 10 meq via INTRAVENOUS
  Filled 2020-02-17 (×4): qty 100

## 2020-02-17 MED ORDER — INSULIN GLARGINE 100 UNIT/ML ~~LOC~~ SOLN
10.0000 [IU] | Freq: Every day | SUBCUTANEOUS | Status: DC
  Filled 2020-02-17: qty 0.1

## 2020-02-17 MED ORDER — "INSULIN SYRINGE-NEEDLE U-100 30G X 5/16"" 0.5 ML MISC": 1.0000 | Freq: Three times a day (TID) | 0 refills | Status: AC

## 2020-02-17 MED ORDER — INSULIN LISPRO (1 UNIT DIAL) 100 UNIT/ML (KWIKPEN): 2.0000 [IU] | PEN_INJECTOR | Freq: Three times a day (TID) | SUBCUTANEOUS | 0 refills | Status: DC

## 2020-02-17 MED ORDER — CHLORHEXIDINE GLUCONATE CLOTH 2 % EX PADS: 6.0000 | MEDICATED_PAD | Freq: Every day | CUTANEOUS | Status: DC

## 2020-02-17 MED ORDER — INSULIN ASPART 100 UNIT/ML ~~LOC~~ SOLN: 2.0000 [IU] | Freq: Three times a day (TID) | SUBCUTANEOUS | 11 refills | Status: DC

## 2020-02-17 MED ORDER — DEXTROSE 50 % IV SOLN
25.0000 mL | Freq: Once | INTRAVENOUS | Status: AC
  Administered 2020-02-17: 25 mL via INTRAVENOUS

## 2020-02-17 MED ORDER — LANTUS SOLOSTAR 100 UNIT/ML ~~LOC~~ SOPN: 15.0000 [IU] | PEN_INJECTOR | Freq: Every day | SUBCUTANEOUS | 0 refills | Status: DC

## 2020-02-17 MED ORDER — DEXTROSE 50 % IV SOLN
50.0000 mL | Freq: Once | INTRAVENOUS | Status: AC
  Administered 2020-02-17: 50 mL via INTRAVENOUS

## 2020-02-17 NOTE — Discharge Instructions (Addendum)
Call your Primary Care Doctor to inform her of your high glucose levels over 200. Call her if you are taking extra insulin doses (you made need a new prescription so will will not run out of insulin again).

## 2020-02-17 NOTE — Plan of Care (Addendum)
Pt discharged to home, issues with lack of insulin at home resolved per [facility] team, IV sites DCd, bleeding controlled, scrips, FU instructions, and DC instructions given, understanding verbalized, pt will leave hospital in taxi w/ hospital voucher.

## 2020-02-17 NOTE — Care Management Obs Status (Signed)
[place] NOTIFICATION   Patient Details  Name: Susan Navarro MRN: [id number] Date of Birth: 05-Jul-1961   Medicare Observation Status Notification Given:  Yes    Candie Chroman, LCSW 02/17/2020, 2:12 PM

## 2020-02-17 NOTE — Evaluation (Signed)
Physical Therapy Evaluation Patient Details Name: Susan Navarro MRN: [id number] DOB: [birth year] Today's Date: 02/17/2020   History of Present Illness  Pt admitted for DKA with BG over 1100. History includes DM, CVA, HTN, and liver cirrhosis. Complains of generalized weakness upon admission and reports not having her insulin available.   Clinical Impression  Pt is a pleasant 59 year old female who was admitted for DKA. Pt performs bed mobility/transfers with independence and ambulation with min assist transitioning to IV pole. Would benefit from trial of AD to determine appropriate need as she is typically independent. Pt demonstrates deficits with strength/balance/safety awareness. Would benefit from skilled PT to address above deficits and promote optimal return to PLOF. Recommend transition to [place] upon discharge from acute hospitalization.     Follow Up Recommendations Home health PT    Equipment Recommendations  None recommended by PT    Recommendations for Other Services       Precautions / Restrictions Precautions Precautions: Fall Restrictions Weight Bearing Restrictions: No      Mobility  Bed Mobility Overal bed mobility: Independent             General bed mobility comments: safe technique with ease of mobility  Transfers Overall transfer level: Independent Equipment used: None             General transfer comment: safe technique with independence noted. Once standing, upright posture noted.  Ambulation/Gait Ambulation/Gait assistance: Min Web designer (Feet): 100 Feet Assistive device: IV Pole Gait Pattern/deviations: Step-to pattern     General Gait Details: ambulated in hallway initially without AD, however given IV pole for management/safety. L foot drag noted with cues for increased step length on L LE. Appears somewhat unaware of safety concerns and needs cues to return back to room  Stairs            Wheelchair Mobility     Modified Rankin (Stroke Patients Only)       Balance Overall balance assessment: Needs assistance Sitting-balance support: Feet supported Sitting balance-Leahy Scale: Good     Standing balance support: Single extremity supported Standing balance-Leahy Scale: Fair Standing balance comment: unsteadiness present, needs HHA assist                             Pertinent Vitals/Pain Pain Assessment: No/denies pain    Home Living Family/patient expects to be discharged to:: Private residence Living Arrangements: Alone Available Help at Discharge: Friend(s);Available PRN/intermittently Type of Home: Apartment Home Access: Stairs to enter Entrance Stairs-Rails: None Entrance Stairs-Number of Steps: 1 curb Home Layout: One level Home Equipment: Walker - 2 wheels;Cane - single point(motorized cart she uses for fatigue purposes)      Prior Function Level of Independence: Independent         Comments: reports she was independent with all mobility and used the motorized cart when fatigued     Hand Dominance        Extremity/Trunk Assessment   Upper Extremity Assessment Upper Extremity Assessment: Generalized weakness(L UE grossly 3+/5; R UE grossly 5/5)    Lower Extremity Assessment Lower Extremity Assessment: Generalized weakness(L LE grossly 3/5 and R LE grossly 4+/5)       Communication   Communication: No difficulties  Cognition Arousal/Alertness: Awake/alert Behavior During Therapy: WFL for tasks assessed/performed Overall Cognitive Status: Within Functional Limits for tasks assessed  General Comments      Exercises     Assessment/Plan    PT Assessment Patient needs continued PT services  PT Problem List Decreased strength;Decreased activity tolerance;Decreased balance;Decreased mobility;Decreased safety awareness;Decreased knowledge of use of DME       PT Treatment Interventions DME  instruction;Gait training;Therapeutic activities;Therapeutic exercise;Balance training    PT Goals (Current goals can be found in the Care Plan section)  Acute Rehab PT Goals Patient Stated Goal: to go home PT Goal Formulation: With patient Time For Goal Achievement: 03/02/20 Potential to Achieve Goals: Good    Frequency Min 2X/week   Barriers to discharge        Co-evaluation               AM-PAC PT "6 Clicks" Mobility  Outcome Measure Help needed turning from your back to your side while in a flat bed without using bedrails?: None Help needed moving from lying on your back to sitting on the side of a flat bed without using bedrails?: None Help needed moving to and from a bed to a chair (including a wheelchair)?: None Help needed standing up from a chair using your arms (e.g., wheelchair or bedside chair)?: A Little Help needed to walk in hospital room?: A Little Help needed climbing 3-5 steps with a railing? : A Little 6 Click Score: 21    End of Session Equipment Utilized During Treatment: Gait belt Activity Tolerance: Patient tolerated treatment well Patient left: in bed Nurse Communication: Mobility status PT Visit Diagnosis: Muscle weakness (generalized) (M62.81);Difficulty in walking, not elsewhere classified (R26.2);Unsteadiness on feet (R26.81)    Time: 1100-1120 PT Time Calculation (min) (ACUTE ONLY): 20 min   Charges:   PT Evaluation $PT Eval Moderate Complexity: 1 Mod PT Treatments $Gait Training: 8-22 mins        Greggory Stallion, PT, DPT [phone]   Mckynlee Luse 02/17/2020, 11:55 AM

## 2020-02-17 NOTE — Discharge Summary (Addendum)
Physician Discharge Summary  Susan Navarro PFX:[id number] DOB: [birth year] DOA: 02/16/2020  PCP: Rochel Brome, MD  Admit date: 02/16/2020 Discharge date: 02/17/2020  Admitted From: Home Disposition: Home  Recommendations for Outpatient Follow-up:  1. Follow up with PCP in 1-2 weeks 2. Please obtain BMP/CBC in one week   Home Health: No Equipment/Devices: None  Discharge Condition: Stable CODE STATUS: Full Diet recommendation: Heart healthy, carb modified  Brief/Interim Summary:  Susan Navarro is a 58 y.o. female with medical history significant of type 1 diabetes, hypertension, stroke, CKD-4, anemia, thrombocytopenia, liver cirrhosis, who presents with fatigue and generalized weakness after running out of her Lantus 2 days prior.  Reported associated symptoms of nausea, poor appetite and poor p.o. intake.  In the ED, patient was found to be in DKA with blood glucose 1183, bicarbonate 24, anion gap 19, beta hydroxybutyric acid 4.07.  No leukocytosis.  Potassium elevated 5.7.  Pseudohyponatremia renal function close to baseline.  Patient was afebrile and normotensive with tachycardia tachypnea.  She was admitted to stepdown unit and placed on insulin drip.  DKA fully resolved by the following morning.  Patient was provided new prescriptions for Lantus and these were delivered to her bedside prior to discharge as she stated she would be unable to secure transportation to retail pharmacy.  Patient was advised to closely follow-up with her primary care and she verbalized understanding agreement.  She is clinically improved and stable for discharge home today.   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 Patient was agreeable to home health PT for gait training for patient's safety and to recover her prior functional status.  This was arranged upon discharge.   Discharge Instructions   Discharge Instructions    Diet - low sodium heart healthy   Complete by: As directed    Increase activity slowly   Complete by: As directed      Allergies as of 02/17/2020   No Known Allergies     Medication List    TAKE these medications   amLODipine 5 MG tablet Commonly known as: NORVASC Take 5 mg by mouth daily.   aspirin EC 81 MG tablet Take 81 mg by mouth daily.   Ensure Max Protein Liqd Take 330 mLs (11 oz total) by mouth 2 (two) times daily between meals.   insulin lispro 100 UNIT/ML KwikPen Commonly known as: HumaLOG KwikPen Inject 0.02-0.03 mLs (2-3 Units total) into the skin 3 (three) times daily before meals.   Lantus SoloStar 100 UNIT/ML Solostar Pen Generic drug: Insulin Glargine Inject 15 Units into the skin daily.   lisinopril 2.5 MG tablet Commonly known as: ZESTRIL Take 2.5 mg by mouth every evening.   metoprolol tartrate 25 MG tablet Commonly known as: LOPRESSOR Take 25 mg by mouth 2 (two) times daily.       No Known Allergies  Consultations:  None   Procedures/Studies:  No results found.    Subjective: Patient seen examined at bedside this morning.  No acute events reported overnight.  She reports feeling much better today.  Denies nausea vomiting.  States she ate all of her breakfast and tolerated.  Agreeable with discharge today but states she will need insulin as she cannot get to a pharmacy to pick it up and has none at home.   Discharge Exam: Vitals:   02/17/20 1200 02/17/20 1300  BP: (!) 150/91   Pulse: 80   Resp: 16 16  Temp: (!) 97.5 F (36.4 C)   SpO2: 100%    Vitals:   02/17/20 1000 02/17/20 1100 02/17/20 1200 02/17/20  1300  BP: (!) 147/115 129/69 (!) 150/91   Pulse: 79 78 80   Resp: 17 16 16 16   Temp:   (!) 97.5 F (36.4 C)   TempSrc:   Oral   SpO2: 100% 100% 100%   Weight:      Height:        General: Pt is alert, awake, not in acute distress Cardiovascular: RRR, S1/S2 +, no rubs, no gallops Respiratory: CTA bilaterally, no wheezing, no rhonchi Abdominal: Soft, NT, ND, bowel sounds + Extremities: no edema, no cyanosis    The results of significant diagnostics from this hospitalization (including imaging, microbiology, ancillary and laboratory) are listed below for reference.     Microbiology: Recent Results (from the past 240 hour(s))  SARS CORONAVIRUS 2 (TAT 6-24 HRS) Nasopharyngeal Nasopharyngeal Swab     Status: None   Collection Time: 02/16/20  3:42 PM   Specimen: Nasopharyngeal Swab  Result Value Ref Range Status   SARS Coronavirus 2 NEGATIVE NEGATIVE Final    Comment: (NOTE) SARS-CoV-2 target nucleic acids are NOT DETECTED. The SARS-CoV-2 RNA is generally detectable in upper and lower respiratory specimens during the acute phase of infection. Negative results do not preclude SARS-CoV-2 infection, do not rule out co-infections with other pathogens, and should not be used as the sole basis for treatment or other patient management decisions. Negative results must be combined with clinical observations, patient history, and epidemiological information. The expected result is Negative. Fact Sheet for Patients: SugarRoll.be Fact Sheet for Healthcare Providers: https://www.woods-mathews.com/ This test is not yet approved or cleared by the [place] FDA and  has been authorized for detection and/or diagnosis of SARS-CoV-2 by FDA under an Emergency Use Authorization (EUA). This EUA will remain  in effect (meaning this test can be used) for the duration of the COVID-19 declaration under Section 56 4(b)(1) of the Act, 21 U.S.C. section  360bbb-3(b)(1), unless the authorization is terminated or revoked sooner. Performed at [facility], [place] [address] [place], [place] [address]   MRSA PCR Screening     Status: None   Collection Time: 02/16/20  6:07 PM   Specimen: Nasopharyngeal  Result Value Ref Range Status   MRSA by PCR NEGATIVE NEGATIVE Final    Comment:        The GeneXpert MRSA Assay (FDA approved for NASAL specimens only), is one component of a comprehensive MRSA colonization surveillance program. It is not intended to diagnose MRSA infection nor to guide or monitor treatment for MRSA infections. Performed at [facility], [address] [place], [place] [address]      Labs: BNP (last 3 results) No results for input(s): BNP in the last 8760 hours. Basic Metabolic Panel: Recent Labs  Lab 02/16/20 1409 02/16/20 1956 02/17/20 0016 02/17/20 0404 02/17/20 0946  NA 126* 130* 136 142 139  K 5.7* 5.2* 4.6 3.8 4.6  CL 83* 91* 100 103 101  CO2 24 26 23 31 27   GLUCOSE 1,183* 838* 444* 113* 138*  BUN 92* 85* 75* 76* 70*  CREATININE 2.28*  2.20* 1.94* 1.87* 1.81*  CALCIUM 9.3 9.5 9.5 9.6 9.3  MG  --   --   --   --  1.8   Liver Function Tests: Recent Labs  Lab 02/16/20 1409  AST 35  ALT 105*  ALKPHOS 478*  BILITOT 1.3*  PROT 6.9  ALBUMIN 3.8   Recent Labs  Lab 02/16/20 1409  LIPASE 59*   Recent Labs  Lab 02/16/20 1956  AMMONIA 26   CBC: Recent Labs  Lab 02/16/20 1409  WBC 4.9  NEUTROABS 4.4  HGB 10.3*  HCT 32.2*  MCV 95.8  PLT 90*   Cardiac Enzymes: No results for input(s): CKTOTAL, CKMB, CKMBINDEX, TROPONINI in the last 168 hours. BNP: Invalid input(s): POCBNP CBG: Recent Labs  Lab 02/17/20 0630 02/17/20 0652 02/17/20 0844 02/17/20 0956 02/17/20 1139  GLUCAP 34* 127* 55* 111* 171*   D-Dimer No results for input(s): DDIMER in the last 72 hours. Hgb A1c No results for input(s): HGBA1C in the last 72 hours. Lipid Profile No results for input(s):  CHOL, HDL, LDLCALC, TRIG, CHOLHDL, LDLDIRECT in the last 72 hours. Thyroid function studies No results for input(s): TSH, T4TOTAL, T3FREE, THYROIDAB in the last 72 hours.  Invalid input(s): FREET3 Anemia work up No results for input(s): VITAMINB12, FOLATE, FERRITIN, TIBC, IRON, RETICCTPCT in the last 72 hours. Urinalysis    Component Value Date/Time   COLORURINE YELLOW (A) 02/16/2020 1923   APPEARANCEUR CLEAR (A) 02/16/2020 1923   LABSPEC 1.018 02/16/2020 1923   PHURINE 5.0 02/16/2020 1923   GLUCOSEU >=500 (A) 02/16/2020 1923   HGBUR NEGATIVE 02/16/2020 [place] NEGATIVE 02/16/2020 [place] NEGATIVE 02/16/2020 [place] NEGATIVE 02/16/2020 1923   NITRITE NEGATIVE 02/16/2020 1923   LEUKOCYTESUR NEGATIVE 02/16/2020 1923   Sepsis Labs Invalid input(s): PROCALCITONIN,  WBC,  LACTICIDVEN Microbiology Recent Results (from the past 240 hour(s))  SARS CORONAVIRUS 2 (TAT 6-24 HRS) Nasopharyngeal Nasopharyngeal Swab     Status: None   Collection Time: 02/16/20  3:42 PM   Specimen: Nasopharyngeal Swab  Result Value Ref Range Status   SARS Coronavirus 2 NEGATIVE NEGATIVE Final    Comment: (NOTE) SARS-CoV-2 target nucleic acids are NOT DETECTED. The SARS-CoV-2 RNA is generally detectable in upper and lower respiratory specimens during the acute phase of infection. Negative results do not preclude SARS-CoV-2 infection, do not rule out co-infections with other pathogens, and should not be used as the sole basis for treatment or other patient management decisions. Negative results must be combined with clinical observations, patient history, and epidemiological information. The expected result is Negative. Fact Sheet for Patients: SugarRoll.be Fact Sheet for Healthcare Providers: https://www.woods-mathews.com/ This test is not yet approved or cleared by the [place] FDA and  has been authorized for detection and/or  diagnosis of SARS-CoV-2 by FDA under an Emergency Use Authorization (EUA). This EUA will remain  in effect (meaning this test can be used) for the duration of the COVID-19 declaration under Section 56 4(b)(1) of the Act, 21 U.S.C. section 360bbb-3(b)(1), unless the authorization is terminated or revoked sooner. Performed at [facility], [place] [address] [place], [place] [address]   MRSA PCR Screening     Status: None   Collection Time: 02/16/20  6:07 PM   Specimen: Nasopharyngeal  Result Value Ref Range Status   MRSA by PCR NEGATIVE NEGATIVE Final    Comment:        The GeneXpert MRSA Assay (FDA approved for NASAL specimens only), is  one component of a comprehensive MRSA colonization surveillance program. It is not intended to diagnose MRSA infection nor to guide or monitor treatment for MRSA infections. Performed at [facility], [address] [place], [place] [address]      Time coordinating discharge: Over 30 minutes  SIGNED:   Ezekiel Slocumb, DO [facility] 02/17/2020, 2:41 PM   If 7PM-7AM, please contact night-coverage [email or web address]

## 2020-02-17 NOTE — Care Management CC44 (Signed)
Condition Code 44 Documentation Completed  Patient Details  Name: Susan Navarro MRN: [id number] Date of Birth: Jan 18, 1961   Condition Code 44 given:  Yes Patient signature on Condition Code 44 notice:  Yes Documentation of 2 MD's agreement:  Yes Code 44 added to claim:  Yes    Candie Chroman, LCSW 02/17/2020, 2:12 PM

## 2020-02-17 NOTE — Progress Notes (Signed)
CBG for 0030 did not carry over from glucometer. Glucose-capillary was 384.  Cameron Ali, RN

## 2020-02-17 NOTE — TOC Transition Note (Signed)
Transition of Care [facility]) - CM/SW Discharge Note   Patient Details  Name: Zyanya Glaza MRN: [id number] Date of Birth: 11-20-1961  Transition of Care [facility]) CM/SW Contact:  Candie Chroman, LCSW Phone Number: 02/17/2020, 3:48 PM   Clinical Narrative:  Patient has orders to discharge home today. It will cost $9 to cover medications to take home. Used petty cash from [employer] to cover. Pharmacist will pick up the meds and bring them to the room. Cab voucher filled out and put on the front of her chart. No further concerns. CSW signing off.   Final next level of care: Home/Self Care Barriers to Discharge: Barriers Resolved   Patient Goals and CMS Choice        Discharge Placement                Patient to be transferred to facility by: Cab voucher   Patient and family notified of of transfer: 02/17/20  Discharge Plan and Services     Post Acute Care Choice: NA                               Social Determinants of Health (SDOH) Interventions     Readmission Risk Interventions Readmission Risk Prevention Plan 12/28/2019  Transportation Screening Complete  HRI or [place] Complete  Palliative Care Screening Not Applicable  Medication Review (RN Care Manager) Referral to Pharmacy  Some recent data might be hidden

## 2020-02-17 NOTE — TOC Initial Note (Signed)
Transition of Care [facility]) - Initial/Assessment Note    Patient Details  Name: Susan Navarro MRN: [id number] Date of Birth: 05-Nov-1961  Transition of Care [facility]) CM/SW Contact:    Candie Chroman, LCSW Phone Number: 02/17/2020, 2:42 PM  Clinical Narrative: CSW met with patient. No supports at bedside. CSW introduced role and explained that PT recommendations would be discussed. Patient declined home health. Her daughter is currently staying with her. She does not have a car and cannot pick her up. Will need cab voucher home. [employer] typically mails patient her prescriptions but have been late this time. Patient has no insulin at home. Pharmacist looking into meds to bed for her.        Expected Discharge Plan: Home/Self Care Barriers to Discharge: Barriers Resolved   Patient Goals and CMS Choice        Expected Discharge Plan and Services Expected Discharge Plan: Home/Self Care     Post Acute Care Choice: NA Living arrangements for the past 2 months: Apartment Expected Discharge Date: 02/17/20                                    Prior Living Arrangements/Services Living arrangements for the past 2 months: Apartment Lives with:: Adult Children Patient language and need for interpreter reviewed:: Yes Do you feel safe going back to the place where you live?: Yes      Need for Family Participation in Patient Care: Yes (Comment) Care giver support system in place?: Yes (comment) Current home services: DME    Activities of Daily Living Home Assistive Devices/Equipment: CBG Meter ADL Screening (condition at time of admission) Patient's cognitive ability adequate to safely complete daily activities?: Yes Is the patient deaf or have difficulty hearing?: No Does the patient have difficulty seeing, even when wearing glasses/contacts?: No Does the patient have difficulty concentrating, remembering, or making decisions?: No Patient able to express need for assistance with ADLs?:  Yes Does the patient have difficulty dressing or bathing?: No Independently performs ADLs?: Yes (appropriate for developmental age) Does the patient have difficulty walking or climbing stairs?: No Weakness of Legs: None Weakness of Arms/Hands: None  Permission Sought/Granted                  Emotional Assessment Appearance:: Appears stated age Attitude/Demeanor/Rapport: Engaged, Gracious Affect (typically observed): Accepting, Appropriate, Calm, Pleasant Orientation: : Oriented to Self, Oriented to Place, Oriented to  Time, Oriented to Situation Alcohol / Substance Use: Not Applicable Psych Involvement: No (comment)  Admission diagnosis:  DKA (diabetic ketoacidoses) ([place]) [E11.10] Type 2 diabetes mellitus with ketoacidosis without coma, with long-term current use of insulin (HCC) [E11.10, Z79.4] Patient Active Problem List   Diagnosis Date Noted  . HTN (hypertension) 02/16/2020  . CKD (chronic kidney disease), stage IV ([place]) 12/30/2019  . Other cirrhosis of liver ([place]) 12/30/2019  . Thrombocytopenia ([place]) 12/30/2019  . 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 Transaminitis 12/30/2019  . Malnutrition of moderate degree 12/28/2019  . Anemia of chronic disease   . Generalized weakness   . Altered mental status   . DKA (diabetic ketoacidoses) ([place]) 12/25/2019  . Hyponatremia   . Hyperkalemia   . Stroke ([place]) 03/2018   PCP:  Rochel Brome, MD Pharmacy:   [facility], [place] - [address] [place] [address] Phone: [phone] Fax: [phone]  [place], [place] [address] Phone: [phone] Fax: [phone]     Social Determinants of Health (SDOH) Interventions    Readmission Risk Interventions Readmission Risk Prevention Plan 12/28/2019   Transportation Screening Complete  HRI or Home Care Consult Complete  Palliative Care Screening Not Applicable  Medication Review (RN Care Manager) Referral to Pharmacy  Some recent data might be hidden

## 2020-02-17 NOTE — Progress Notes (Addendum)
Inpatient Diabetes Program Recommendations  AACE/ADA: New Consensus Statement on Inpatient Glycemic Control (2015)  Target Ranges:  Prepandial:   less than 140 mg/dL      Peak postprandial:   less than 180 mg/dL (1-2 hours)      Critically ill patients:  140 - 180 mg/dL   Lab Results  Component Value Date   GLUCAP 127 (H) 02/17/2020   HGBA1C 12.2 (H) 10/08/2019    Review of Glycemic Control Results for Susan Navarro, Susan Navarro (MRN [id number]) as of 02/17/2020 08:34  Ref. Range 02/17/2020 02:26 02/17/2020 03:27 02/17/2020 04:27 02/17/2020 04:55 02/17/2020 05:26 02/17/2020 06:28 02/17/2020 06:30 02/17/2020 06:52  Glucose-Capillary Latest Ref Range: 70 - 99 mg/dL 217 (H) 138 (H) 70 63 (L) 90 37 (LL) 34 (LL) 127 (H)   Diabetes history: DM type 1 Outpatient Diabetes medications: Lantus 15 units, Humalog 2-3 units tid before meals Current orders for Inpatient glycemic control:  IV insulin gtt  Inpatient Diabetes Program Recommendations:    Hypoglycemia while on IV insulin.  At time of transition consider  Lantus 10 units and Novolog "very sensitive" 0-6 units tid + hs coverage.  Spoke with pt at bedside regarding "missing insulin doses and running out of insulin." Pt reports getting mail order from [facility] every 3 months and she just did not get a delivery of insulin.   Will call daughter to see if insulin has shown up at the house. Daughter works until 5 pm. Will need a back up plan to get insulin from [facility] in [place], [place] close to pt in case the insulin is not at the house.  [facility] [place], [place], [place] [address]  Spoke with daughter over the phone. She is currently at the pt's house and the insulin has not arrived. She reports the pt called the mail order pharmacy 2 days ago and supposedly the insulin is on its way.  Spoke with pt again she was sometimes taking extra insulin due to hyperglycemia. She will ultimately need an increase in her prescription from her PCP so she will not run out of  insulin again.  Thanks,  Tama Headings RN, MSN, BC-ADM Inpatient Diabetes Coordinator Team Pager [phone] (8a-5p)

## 2020-02-20 LAB — GLUCOSE, CAPILLARY: Glucose-Capillary: 37 mg/dL — CL (ref 70–99)

## 2020-06-27 ENCOUNTER — Observation Stay
Admission: EM | Admit: 2020-06-27 | Discharge: 2020-06-29 | Disposition: A | Discharge status: Home / Self Care | Payer: Medicare HMO | Attending: Internal Medicine | Admitting: Internal Medicine

## 2020-06-27 ENCOUNTER — Other Ambulatory Visit: Payer: Self-pay

## 2020-06-27 ENCOUNTER — Emergency Department: Payer: Medicare HMO

## 2020-06-27 DIAGNOSIS — W19XXXA Unspecified fall, initial encounter: Secondary | ICD-10-CM | POA: Diagnosis not present

## 2020-06-27 DIAGNOSIS — Y93E2 Activity, laundry: Secondary | ICD-10-CM | POA: Insufficient documentation

## 2020-06-27 DIAGNOSIS — M25552 Pain in left hip: Secondary | ICD-10-CM | POA: Diagnosis present

## 2020-06-27 DIAGNOSIS — E1122 Type 2 diabetes mellitus with diabetic chronic kidney disease: Secondary | ICD-10-CM | POA: Diagnosis not present

## 2020-06-27 DIAGNOSIS — Z79899 Other long term (current) drug therapy: Secondary | ICD-10-CM | POA: Diagnosis not present

## 2020-06-27 DIAGNOSIS — N189 Chronic kidney disease, unspecified: Secondary | ICD-10-CM | POA: Insufficient documentation

## 2020-06-27 DIAGNOSIS — Z794 Long term (current) use of insulin: Secondary | ICD-10-CM | POA: Insufficient documentation

## 2020-06-27 DIAGNOSIS — Z20822 Contact with and (suspected) exposure to covid-19: Secondary | ICD-10-CM | POA: Insufficient documentation

## 2020-06-27 DIAGNOSIS — S32592A Other specified fracture of left pubis, initial encounter for closed fracture: Secondary | ICD-10-CM | POA: Diagnosis not present

## 2020-06-27 DIAGNOSIS — I10 Essential (primary) hypertension: Secondary | ICD-10-CM

## 2020-06-27 DIAGNOSIS — Z7982 Long term (current) use of aspirin: Secondary | ICD-10-CM | POA: Diagnosis not present

## 2020-06-27 DIAGNOSIS — W010XXA Fall on same level from slipping, tripping and stumbling without subsequent striking against object, initial encounter: Secondary | ICD-10-CM | POA: Diagnosis not present

## 2020-06-27 DIAGNOSIS — E1165 Type 2 diabetes mellitus with hyperglycemia: Secondary | ICD-10-CM

## 2020-06-27 DIAGNOSIS — I129 Hypertensive chronic kidney disease with stage 1 through stage 4 chronic kidney disease, or unspecified chronic kidney disease: Secondary | ICD-10-CM | POA: Insufficient documentation

## 2020-06-27 DIAGNOSIS — Z01811 Encounter for preprocedural respiratory examination: Secondary | ICD-10-CM

## 2020-06-27 LAB — GLUCOSE, CAPILLARY
Glucose-Capillary: 80 mg/dL (ref 70–99)
Glucose-Capillary: 133 mg/dL — ABNORMAL HIGH (ref 70–99)
Glucose-Capillary: 126 mg/dL — ABNORMAL HIGH (ref 70–99)

## 2020-06-27 LAB — CBC WITH DIFFERENTIAL/PLATELET
Abs Immature Granulocytes: 0.01 10*3/uL (ref 0.00–0.07)
Basophils Absolute: 0 10*3/uL (ref 0.0–0.1)
Basophils Relative: 0 %
Eosinophils Absolute: 0 10*3/uL (ref 0.0–0.5)
Eosinophils Relative: 0 %
HCT: 29.4 % — ABNORMAL LOW (ref 36.0–46.0)
Hemoglobin: 10.1 g/dL — ABNORMAL LOW (ref 12.0–15.0)
Immature Granulocytes: 0 %
Lymphocytes Relative: 12 %
Lymphs Abs: 0.6 10*3/uL — ABNORMAL LOW (ref 0.7–4.0)
MCH: 31.1 pg (ref 26.0–34.0)
MCHC: 34.4 g/dL (ref 30.0–36.0)
MCV: 90.5 fL (ref 80.0–100.0)
Monocytes Absolute: 0.3 10*3/uL (ref 0.1–1.0)
Monocytes Relative: 6 %
Neutro Abs: 4.3 10*3/uL (ref 1.7–7.7)
Neutrophils Relative %: 82 %
Platelets: 75 10*3/uL — ABNORMAL LOW (ref 150–400)
RBC: 3.25 MIL/uL — ABNORMAL LOW (ref 3.87–5.11)
RDW: 12.2 % (ref 11.5–15.5)
WBC: 5.2 10*3/uL (ref 4.0–10.5)
nRBC: 0 % (ref 0.0–0.2)

## 2020-06-27 LAB — CREATININE, SERUM
Creatinine, Ser: 1.56 mg/dL — ABNORMAL HIGH (ref 0.44–1.00)
GFR calc Af Amer: 41 mL/min — ABNORMAL LOW (ref >60)
GFR calc non Af Amer: 36 mL/min — ABNORMAL LOW (ref >60)

## 2020-06-27 LAB — BASIC METABOLIC PANEL
Anion gap: 11 (ref 5–15)
BUN: 42 mg/dL — ABNORMAL HIGH (ref 6–20)
CO2: 23 mmol/L (ref 22–32)
Calcium: 8.8 mg/dL — ABNORMAL LOW (ref 8.9–10.3)
Chloride: 107 mmol/L (ref 98–111)
Creatinine, Ser: 1.55 mg/dL — ABNORMAL HIGH (ref 0.44–1.00)
GFR calc Af Amer: 42 mL/min — ABNORMAL LOW (ref >60)
GFR calc non Af Amer: 36 mL/min — ABNORMAL LOW (ref >60)
Glucose, Bld: 147 mg/dL — ABNORMAL HIGH (ref 70–99)
Potassium: 4 mmol/L (ref 3.5–5.1)
Sodium: 141 mmol/L (ref 135–145)

## 2020-06-27 LAB — SARS CORONAVIRUS 2 (TAT 6-24 HRS): SARS Coronavirus 2: NEGATIVE

## 2020-06-27 LAB — HEMOGLOBIN A1C
Hgb A1c MFr Bld: 10.6 % — ABNORMAL HIGH (ref 4.8–5.6)
Mean Plasma Glucose: 257.52 mg/dL

## 2020-06-27 IMAGING — CR DG HIP (WITH OR WITHOUT PELVIS) 2-3V*L*
3 series · 3 of 3 positions shown · non-contrast
Comparison: None.

CLINICAL DATA: Left hip pain after fall today.

EXAM:
DG HIP (WITH OR WITHOUT PELVIS) 2-3V LEFT

[pelvis ap]
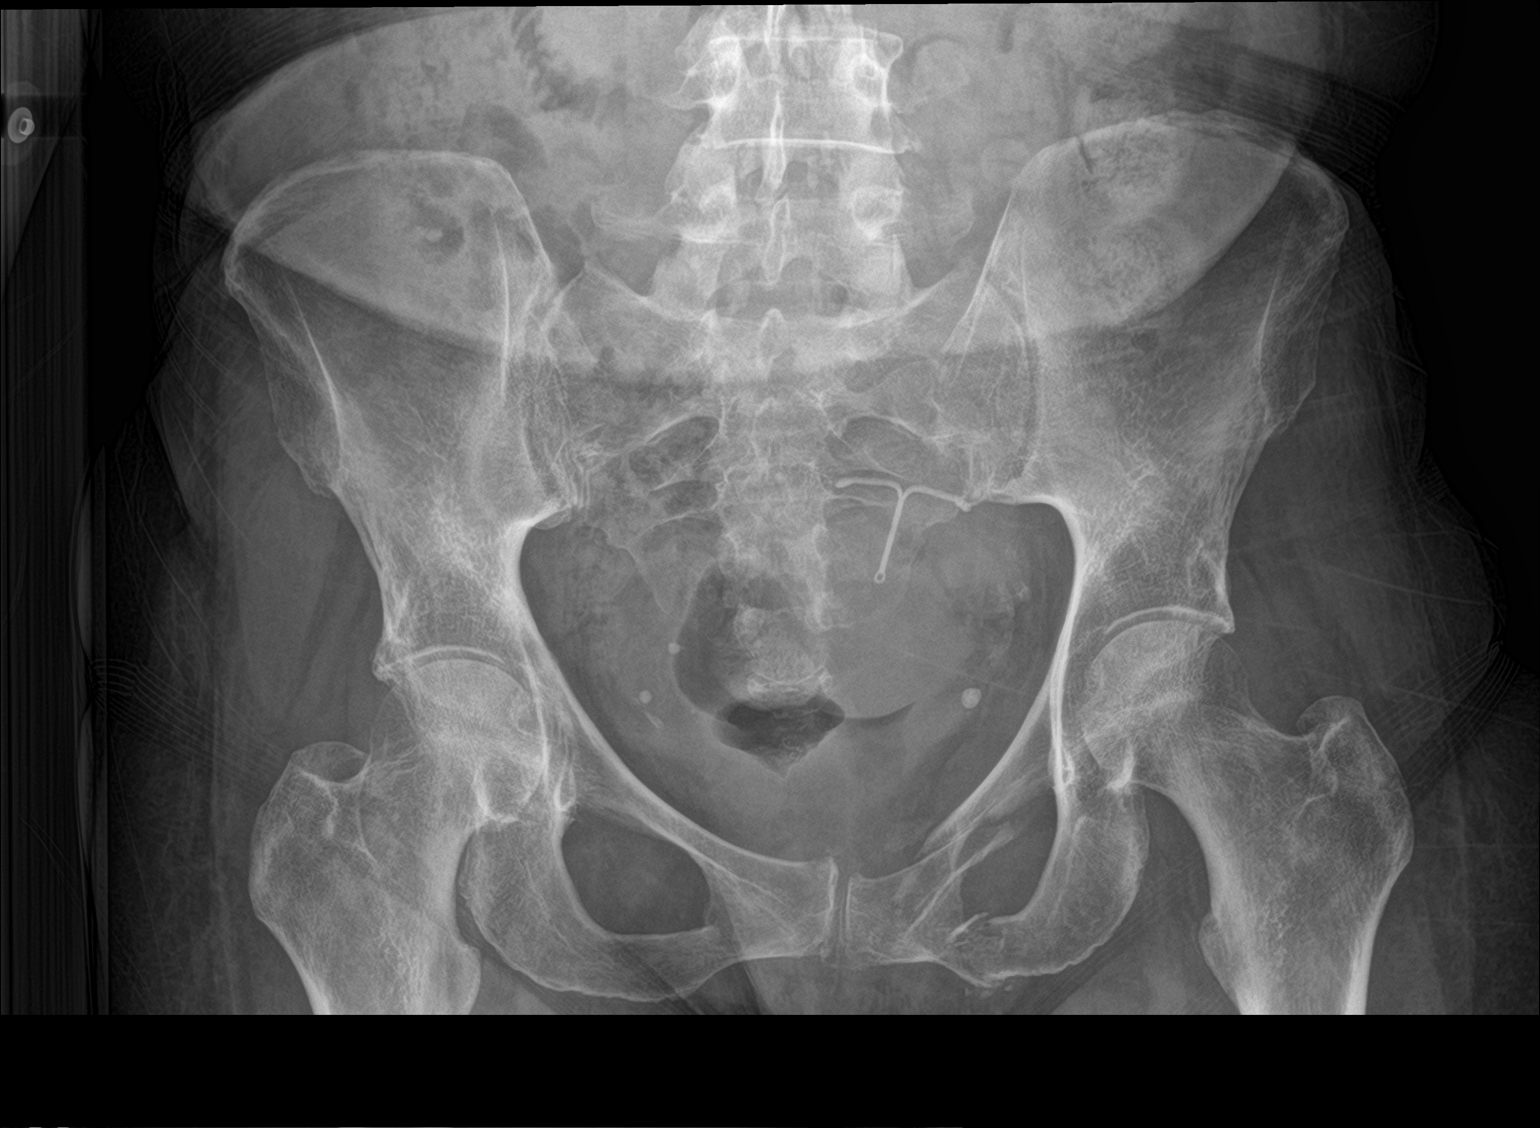

[hip ap]
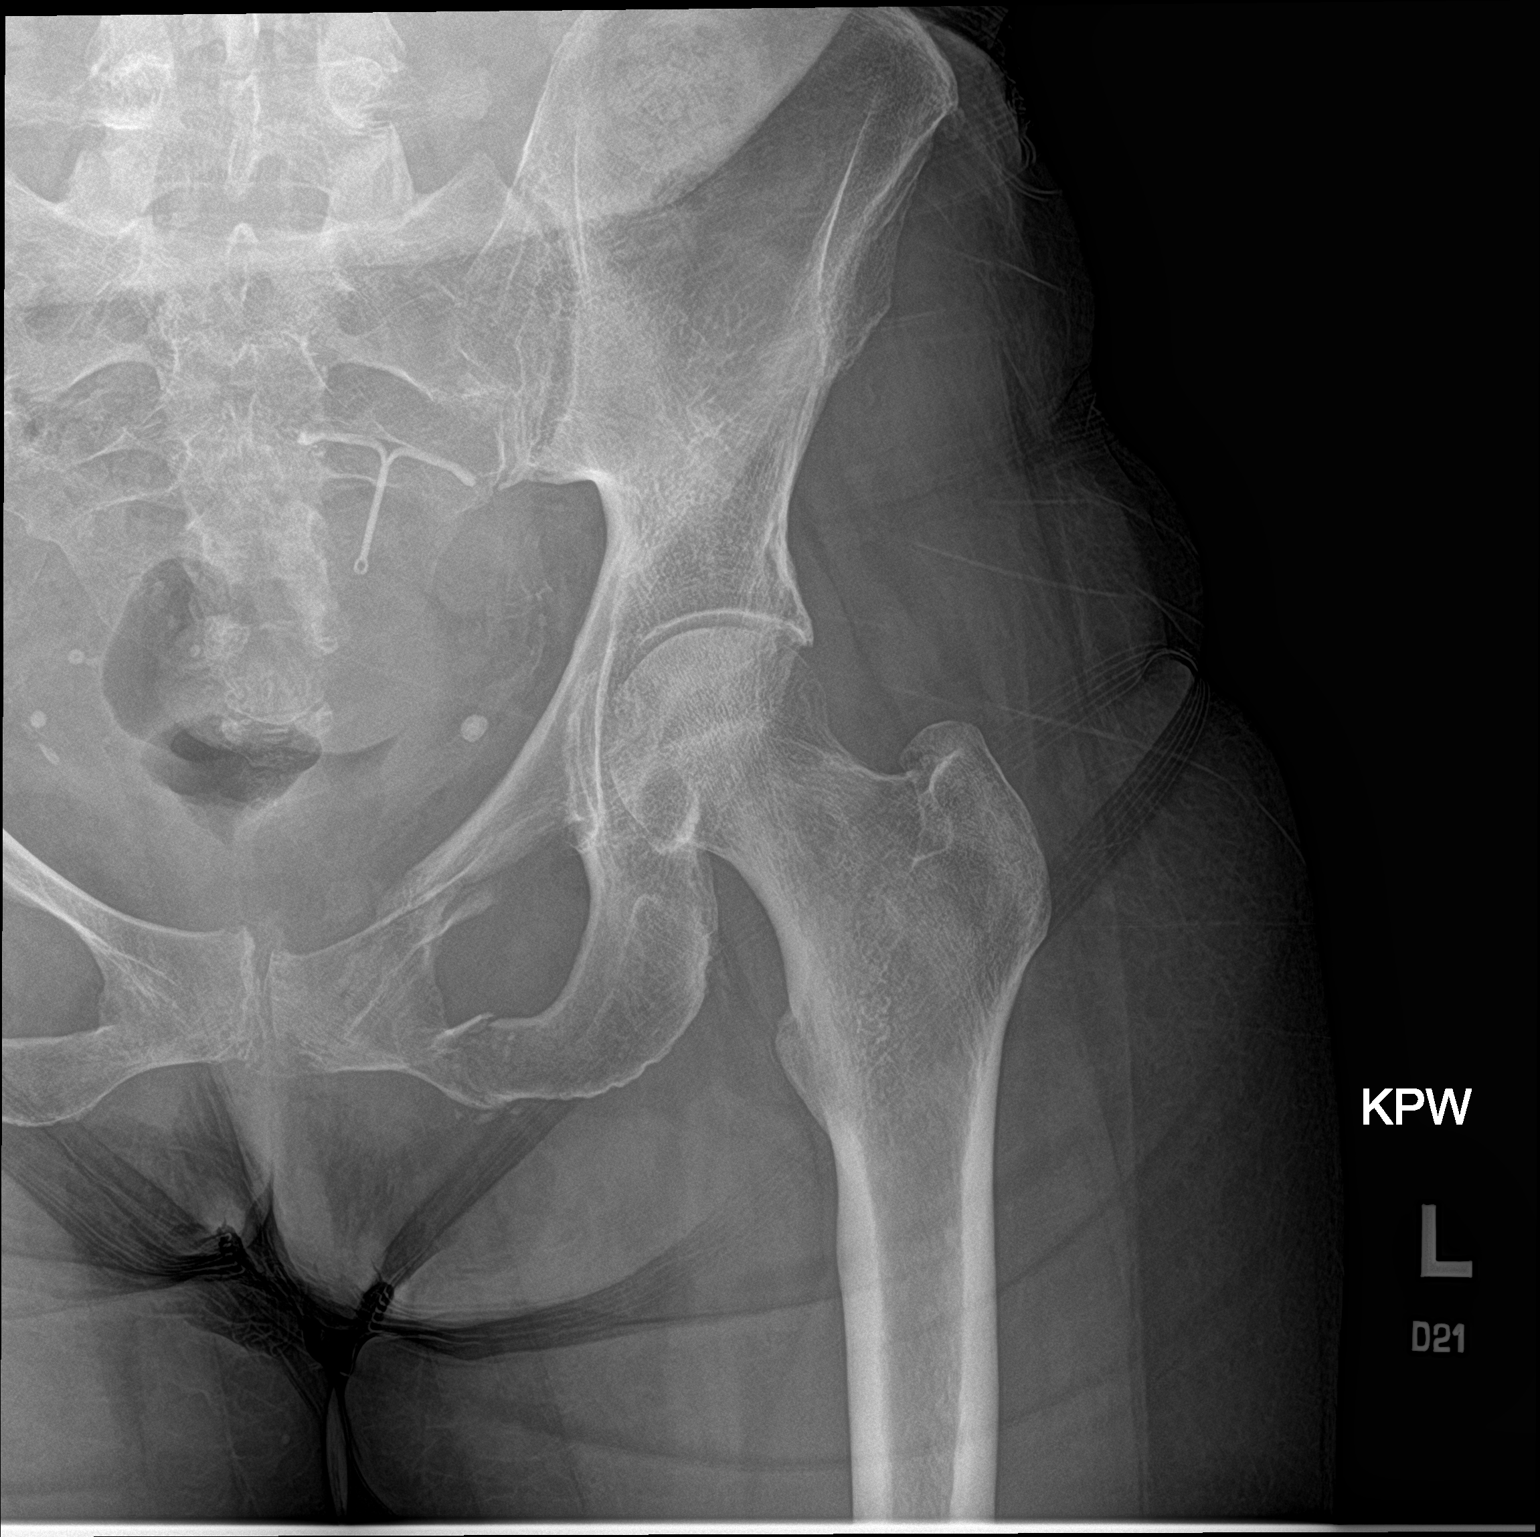

[hip lat]
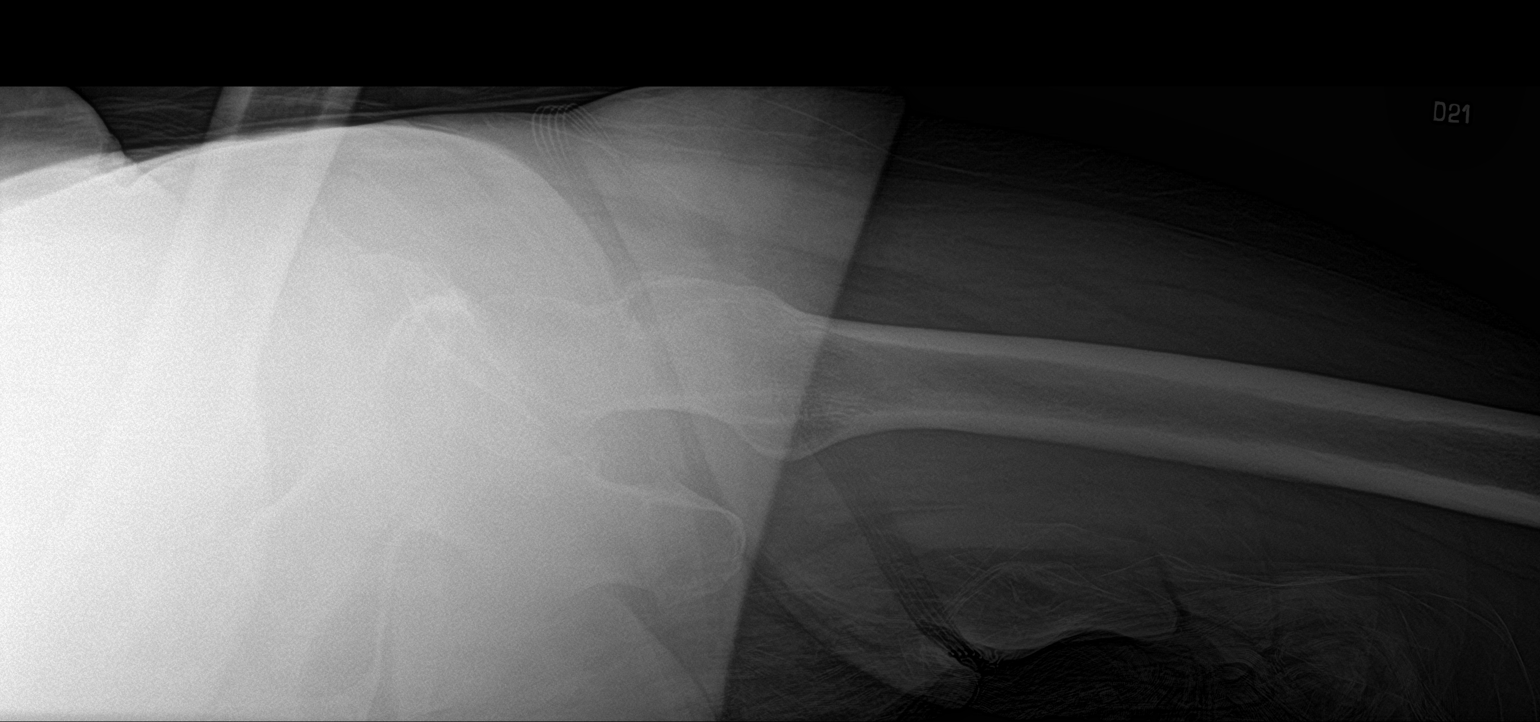

[3 of 3 positions shown; findings below may reference images not displayed]

FINDINGS: Moderately displaced fractures are seen involving the left superior
and inferior pubic rami. No other fracture or dislocation is noted
the hip joints are unremarkable. Phleboliths and intrauterine device
are noted.
IMPRESSION: Moderately displaced left superior and inferior pubic rami
fractures.

## 2020-06-27 IMAGING — CR DG CHEST 1V
1 series · 1 of 1 positions shown · non-contrast
Comparison: [DATE].

CLINICAL DATA: Fall.

EXAM:
CHEST  1 VIEW

[chest ap]
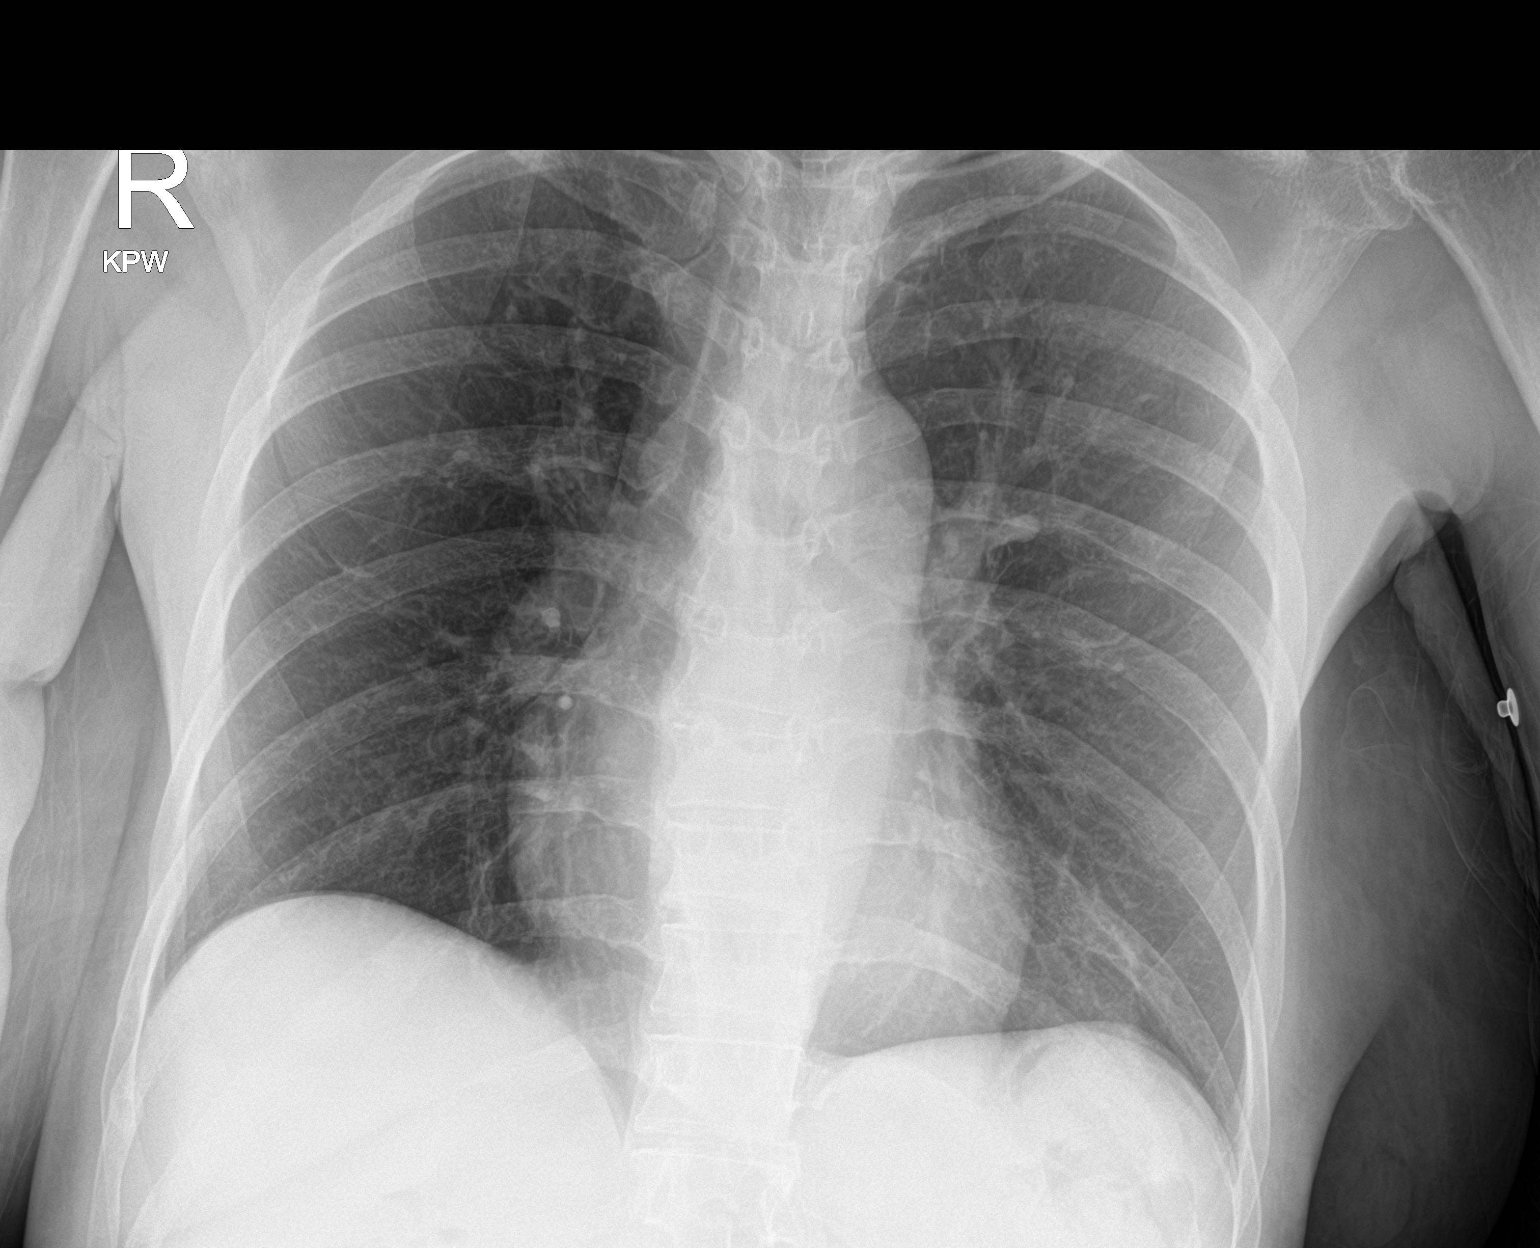

[1 of 1 positions shown; findings below may reference images not displayed]

FINDINGS: The heart size and mediastinal contours are within normal limits.
Both lungs are clear. No pneumothorax or pleural effusion is noted.
The visualized skeletal structures are unremarkable.
IMPRESSION: No active disease.

## 2020-06-27 MED ORDER — HYDRALAZINE HCL 20 MG/ML IJ SOLN
10.0000 mg | Freq: Four times a day (QID) | INTRAMUSCULAR | Status: DC | PRN
  Filled 2020-06-27: qty 0.5

## 2020-06-27 MED ORDER — MORPHINE SULFATE (PF) 4 MG/ML IV SOLN
4.0000 mg | Freq: Once | INTRAVENOUS | Status: AC
  Administered 2020-06-27: 4 mg via INTRAVENOUS
  Filled 2020-06-27: qty 1

## 2020-06-27 MED ORDER — ASPIRIN EC 81 MG PO TBEC
81.0000 mg | DELAYED_RELEASE_TABLET | Freq: Every day | ORAL | Status: DC
  Administered 2020-06-27 – 2020-06-29 (×3): 81 mg via ORAL
  Filled 2020-06-27 (×3): qty 1

## 2020-06-27 MED ORDER — INSULIN GLARGINE 100 UNIT/ML ~~LOC~~ SOLN
15.0000 [IU] | Freq: Every day | SUBCUTANEOUS | Status: DC
  Administered 2020-06-28: 15 [IU] via SUBCUTANEOUS
  Filled 2020-06-27 (×5): qty 0.15

## 2020-06-27 MED ORDER — ADULT MULTIVITAMIN W/MINERALS CH
1.0000 | ORAL_TABLET | Freq: Every day | ORAL | Status: DC
  Administered 2020-06-28 – 2020-06-29 (×2): 1 via ORAL
  Filled 2020-06-27 (×2): qty 1

## 2020-06-27 MED ORDER — HYDROCODONE-ACETAMINOPHEN 5-325 MG PO TABS
1.0000 | ORAL_TABLET | ORAL | Status: DC | PRN
  Administered 2020-06-28: 1 via ORAL
  Administered 2020-06-29 (×2): 2 via ORAL
  Filled 2020-06-27: qty 2
  Filled 2020-06-27: qty 1
  Filled 2020-06-27: qty 2

## 2020-06-27 MED ORDER — ACETAMINOPHEN 325 MG PO TABS
650.0000 mg | ORAL_TABLET | Freq: Four times a day (QID) | ORAL | Status: DC | PRN
  Administered 2020-06-28 – 2020-06-29 (×2): 650 mg via ORAL
  Filled 2020-06-27 (×2): qty 2

## 2020-06-27 MED ORDER — LIDOCAINE 5 % EX PTCH
1.0000 | MEDICATED_PATCH | CUTANEOUS | Status: DC
  Administered 2020-06-27: 1 via TRANSDERMAL
  Filled 2020-06-27 (×2): qty 1

## 2020-06-27 MED ORDER — MORPHINE SULFATE (PF) 2 MG/ML IV SOLN
1.0000 mg | INTRAVENOUS | Status: DC | PRN
  Administered 2020-06-29: 1 mg via INTRAVENOUS
  Filled 2020-06-27: qty 1

## 2020-06-27 MED ORDER — ACETAMINOPHEN 650 MG RE SUPP: 650.0000 mg | Freq: Four times a day (QID) | RECTAL | Status: DC | PRN

## 2020-06-27 MED ORDER — LISINOPRIL 5 MG PO TABS
2.5000 mg | ORAL_TABLET | Freq: Every evening | ORAL | Status: DC
  Administered 2020-06-28: 2.5 mg via ORAL
  Filled 2020-06-27: qty 1

## 2020-06-27 MED ORDER — AMLODIPINE BESYLATE 5 MG PO TABS
5.0000 mg | ORAL_TABLET | Freq: Every day | ORAL | Status: DC
  Administered 2020-06-27 – 2020-06-28 (×2): 5 mg via ORAL
  Filled 2020-06-27 (×3): qty 1

## 2020-06-27 MED ORDER — INSULIN ASPART 100 UNIT/ML ~~LOC~~ SOLN
0.0000 [IU] | SUBCUTANEOUS | Status: DC
  Administered 2020-06-27 (×2): 2 [IU] via SUBCUTANEOUS
  Administered 2020-06-28: 11 [IU] via SUBCUTANEOUS
  Administered 2020-06-28: 5 [IU] via SUBCUTANEOUS
  Administered 2020-06-28 – 2020-06-29 (×3): 3 [IU] via SUBCUTANEOUS
  Administered 2020-06-29: 2 [IU] via SUBCUTANEOUS
  Filled 2020-06-27 (×8): qty 1

## 2020-06-27 MED ORDER — HEPARIN SODIUM (PORCINE) 5000 UNIT/ML IJ SOLN
5000.0000 [IU] | Freq: Three times a day (TID) | INTRAMUSCULAR | Status: DC
  Administered 2020-06-27 – 2020-06-29 (×4): 5000 [IU] via SUBCUTANEOUS
  Filled 2020-06-27 (×4): qty 1

## 2020-06-27 MED ORDER — METOPROLOL TARTRATE 25 MG PO TABS
25.0000 mg | ORAL_TABLET | Freq: Two times a day (BID) | ORAL | Status: DC
  Administered 2020-06-27 – 2020-06-28 (×3): 25 mg via ORAL
  Filled 2020-06-27 (×4): qty 1

## 2020-06-27 NOTE — ED Notes (Signed)
Pharmacy at bedside

## 2020-06-27 NOTE — ED Notes (Signed)
This tech assisted pt with bed pan.

## 2020-06-27 NOTE — ED Notes (Signed)
Pt briefly moved to room 30 in C-pod for privacy while being assisted to use bedpan. Pt urinated. Peri care provided and bedpad placed under pt. Pt repositioned.

## 2020-06-27 NOTE — ED Notes (Signed)
Hospitalist at bedside 

## 2020-06-27 NOTE — ED Notes (Signed)
Pt laying calmly in bed; alert; c/o L hip pain post fall; inward rotation; L foot warm/appropriate color/pulse 2+. Pt denies numbness. Can move leg minimally d/t pain.

## 2020-06-27 NOTE — H&P (Signed)
History and Physical    Susan Navarro PYK:[id number] DOB: 08-11-1961 DOA: 06/27/2020  PCP: Rochel Brome, MD  Patient coming from: Home   Chief Complaint: Status post fall  HPI: Susan Navarro is a 59 y.o. female with medical history significant of chronic kidney disease, anemia, diabetes mellitus without complication, hypertension, stroke presents status post mechanical fall today.  Patient was going  To load washer and tripped and fell forward. She did not have any dizziness, lightheadedness, syncope or presyncopal episodes.  No chest pain or shortness of breath.  She believes her foot was placed in the wrong way.  She was complaining of pain upon arrival.  Denies hitting her head or losing consciousness. ED Course: In the ED 3/96 and heart rate 106 afebrile.  Chest x-ray with no acute cardiopulmonary disease.  Left hip chest x-ray reveals Moderately displaced left superior and inferior pubic rami Fractures.. In the ED she was given pain meds.  ER spoke with Dr. Mack Guise orthopedics and he fell there is no surgical intervention at this time.  I have asked orthopedics to see patient judgment.  Labs were just drawn in the ER and is pending.  EKG pending  Review of Systems: All systems reviewed and otherwise negative.    Past Medical History:  Diagnosis Date  . Anemia   . Chronic kidney disease   . Diabetes mellitus without complication ([place])   . Hypertension   . Stroke [facility]) 03/2018   Ischemic stroke per [facility] records    History reviewed. No pertinent surgical history.   reports that she has never smoked. She has never used smokeless tobacco. She reports that she does not drink alcohol and does not use drugs.  No Known Allergies  Family History  Problem Relation Age of Onset  . Diabetes Mother   . Diabetes Brother      Prior to Admission medications   Medication Sig Start Date End Date Taking? Authorizing Provider  amLODipine (NORVASC) 5 MG tablet Take 5 mg by mouth  daily. 12/15/19   [provider]  aspirin EC 81 MG tablet Take 81 mg by mouth daily. 02/09/17   [provider]  Ensure Max Protein (ENSURE MAX PROTEIN) LIQD Take 330 mLs (11 oz total) by mouth 2 (two) times daily between meals. 12/30/19   Dhungel, Flonnie Overman, MD  Insulin Glargine (LANTUS SOLOSTAR) 100 UNIT/ML Solostar Pen Inject 15 Units into the skin daily. 02/17/20   Ezekiel Slocumb, DO  insulin lispro (HUMALOG KWIKPEN) 100 UNIT/ML KwikPen Inject 0.02-0.03 mLs (2-3 Units total) into the skin 3 (three) times daily before meals. 02/17/20   Ezekiel Slocumb, DO  Insulin Syringe-Needle U-100 (HEALTHWISE INSULIN SYR/NEEDLE) 30G X 5/16" 0.5 ML MISC 1 each by Does not apply route 3 (three) times daily with meals. 02/17/20   Nicole Kindred A, DO  lisinopril (ZESTRIL) 2.5 MG tablet Take 2.5 mg by mouth every evening.     [provider]  metoprolol tartrate (LOPRESSOR) 25 MG tablet Take 25 mg by mouth 2 (two) times daily.     [provider]    Physical Exam: Vitals:   06/27/20 1147 06/27/20 1149 06/27/20 1242 06/27/20 1330  BP: (!) 143/96  (!) 160/84 (!) 158/79  Pulse: (!) 106  (!) 109 95  Resp: 18  18 18   Temp: 98.6 F (37 C)     TempSrc: Oral     SpO2: 99%  99% 100%  Weight:  56.7 kg    Height:  5' 6"  (  1.676 m)      Constitutional: NAD, calm, comfortable Vitals:   06/27/20 1147 06/27/20 1149 06/27/20 1242 06/27/20 1330  BP: (!) 143/96  (!) 160/84 (!) 158/79  Pulse: (!) 106  (!) 109 95  Resp: 18  18 18   Temp: 98.6 F (37 C)     TempSrc: Oral     SpO2: 99%  99% 100%  Weight:  56.7 kg    Height:  5' 6"  (1.676 m)     Eyes: PERRL, lids and conjunctivae normal ENMT: Mucous membranes are moist. Neck: normal, supple, no masses Respiratory: clear to auscultation bilaterally, no wheezing, no crackles. Normal respiratory effort. No accessory muscle use.  Cardiovascular: Regular rate and rhythm, 2/6 systolic  Murmur, no  rubs / gallops. No extremity  edema Abdomen: no tenderness, no masses palpated. No hepatosplenomegaly. Bowel sounds positive.  Musculoskeletal: no clubbing / cyanosis. No joint deformity upper and lower extremities.  Skin: no rashes, lesions, ulcers. No induration Neurologic: CN 2-12 grossly intact.  Pain illicited on Left leg with strength testing Psychiatric: Normal judgment and insight. Alert and oriented x 3. Normal mood.    Labs on Admission: I have personally reviewed following labs and imaging studies  CBC: No results for input(s): WBC, NEUTROABS, HGB, HCT, MCV, PLT in the last 168 hours. Basic Metabolic Panel: No results for input(s): NA, K, CL, CO2, GLUCOSE, BUN, CREATININE, CALCIUM, MG, PHOS in the last 168 hours. GFR: CrCl cannot be calculated (Patient's most recent lab result is older than the maximum 21 days allowed.). Liver Function Tests: No results for input(s): AST, ALT, ALKPHOS, BILITOT, PROT, ALBUMIN in the last 168 hours. No results for input(s): LIPASE, AMYLASE in the last 168 hours. No results for input(s): AMMONIA in the last 168 hours. Coagulation Profile: No results for input(s): INR, PROTIME in the last 168 hours. Cardiac Enzymes: No results for input(s): CKTOTAL, CKMB, CKMBINDEX, TROPONINI in the last 168 hours. BNP (last 3 results) No results for input(s): PROBNP in the last 8760 hours. HbA1C: No results for input(s): HGBA1C in the last 72 hours. CBG: No results for input(s): GLUCAP in the last 168 hours. Lipid Profile: No results for input(s): CHOL, HDL, LDLCALC, TRIG, CHOLHDL, LDLDIRECT in the last 72 hours. Thyroid Function Tests: No results for input(s): TSH, T4TOTAL, FREET4, T3FREE, THYROIDAB in the last 72 hours. Anemia Panel: No results for input(s): VITAMINB12, FOLATE, FERRITIN, TIBC, IRON, RETICCTPCT in the last 72 hours. Urine analysis:    Component Value Date/Time   COLORURINE YELLOW (A) 02/16/2020 1923   APPEARANCEUR CLEAR (A) 02/16/2020 1923   LABSPEC 1.018  02/16/2020 1923   PHURINE 5.0 02/16/2020 1923   GLUCOSEU >=500 (A) 02/16/2020 1923   HGBUR NEGATIVE 02/16/2020 [place] NEGATIVE 02/16/2020 [place] NEGATIVE 02/16/2020 [place] NEGATIVE 02/16/2020 1923   NITRITE NEGATIVE 02/16/2020 1923   LEUKOCYTESUR NEGATIVE 02/16/2020 1923    Radiological Exams on Admission: DG Chest 1 View  Result Date: 06/27/2020 CLINICAL DATA:  Fall. EXAM: CHEST  1 VIEW COMPARISON:  October 07, 2019. FINDINGS: The heart size and mediastinal contours are within normal limits. Both lungs are clear. No pneumothorax or pleural effusion is noted. The visualized skeletal structures are unremarkable. IMPRESSION: No active disease. Electronically Signed   By: Marijo Conception M.D.   On: 06/27/2020 12:53   DG Hip Unilat With Pelvis 2-3 Views Left  Result Date: 06/27/2020 CLINICAL DATA:  Left hip pain after fall today. EXAM: DG HIP (WITH  OR WITHOUT PELVIS) 2-3V LEFT COMPARISON:  None. FINDINGS: Moderately displaced fractures are seen involving the left superior and inferior pubic rami. No other fracture or dislocation is noted the hip joints are unremarkable. Phleboliths and intrauterine device are noted. IMPRESSION: Moderately displaced left superior and inferior pubic rami fractures. Electronically Signed   By: Marijo Conception M.D.   On: 06/27/2020 12:52    EKG: Independently reviewed.pending  Assessment/Plan Active Problems:   * No active hospital problems. *    1. S/p mechanical fall - found with Moderately displaced left superior and inferior pubic rami Fractures. Orthopedics consulted.  Pain mx Lidocaine patch   2. Essential HTN- mildly elevated, likely pain attributing Will resume home medications after reconciliation  3. IDDM II- uncontrolled based on previous HA1c 14. Will resume home lantus dose riss Ck fs Ck HA1c  4. CKD- no labs yet done in er. Pending. Will f/u    DVT prophylaxis: heparin  Code Status: code  Family  Communication: none   Disposition Plan: rehab v.s. home  Consults called: orthopedics  Admission status: inpatient as pt requires more than 2 MN stays.    Nolberto Hanlon MD [facility] Pager [phone]   If 7PM-7AM, please contact night-coverage [email or web address] Password [facility]  06/27/2020, 3:31 PM

## 2020-06-27 NOTE — ED Notes (Signed)
This tech and Caitlyn, NT placed pt on pur-wick.

## 2020-06-27 NOTE — ED Provider Notes (Signed)
[facility] Emergency Department Provider Note   ____________________________________________   First MD Initiated Contact with Patient 06/27/20 1230     (approximate)  I have reviewed the triage vital signs and the nursing notes.   HISTORY  Chief Complaint Hip Pain    HPI Susan Navarro is a 59 y.o. female with possible history of stroke, hypertension, diabetes, CKD, and cirrhosis who presents to the ED complaining of hip pain.  Patient reports that she was going to load her washing machine when she "tripped over her feet" and fell to the ground, striking her left hip.  Patient states that she has been unable to walk since the fall and has significant pain if she attempts to bear weight on her left leg.  She denies hitting her head or losing consciousness, does not have any areas of pain other than her left hip.  She has otherwise been feeling well recently with no fevers, cough, chest pain, shortness of breath, lightheadedness, or weakness.        Past Medical History:  Diagnosis Date  . Anemia   . Chronic kidney disease   . Diabetes mellitus without complication ([place])   . Hypertension   . Stroke [facility]) 03/2018   Ischemic stroke per [facility] records    Patient Active Problem List   Diagnosis Date Noted  . HTN (hypertension) 02/16/2020  . CKD (chronic kidney disease), stage IV ([place]) 12/30/2019  . Other cirrhosis of liver ([place]) 12/30/2019  . Thrombocytopenia ([place]) 12/30/2019  . 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 Transaminitis 12/30/2019  . Malnutrition of moderate degree 12/28/2019  . Anemia of chronic disease   . Generalized weakness   . Altered mental status   . DKA (diabetic ketoacidoses) ([place]) 12/25/2019  . Hyponatremia   . Hyperkalemia   . Stroke [facility]) 03/2018    History reviewed. No pertinent surgical history.  Prior to Admission medications   Medication Sig Start Date End Date Taking?  Authorizing Provider  amLODipine (NORVASC) 5 MG tablet Take 5 mg by mouth daily. 12/15/19   [provider]  aspirin EC 81 MG tablet Take 81 mg by mouth daily. 02/09/17   [provider]  Ensure Max Protein (ENSURE MAX PROTEIN) LIQD Take 330 mLs (11 oz total) by mouth 2 (two) times daily between meals. 12/30/19   Dhungel, Flonnie Overman, MD  Insulin Glargine (LANTUS SOLOSTAR) 100 UNIT/ML Solostar Pen Inject 15 Units into the skin daily. 02/17/20   Ezekiel Slocumb, DO  insulin lispro (HUMALOG KWIKPEN) 100 UNIT/ML KwikPen Inject 0.02-0.03 mLs (2-3 Units total) into the skin 3 (three) times daily before meals. 02/17/20   Ezekiel Slocumb, DO  Insulin Syringe-Needle U-100 (HEALTHWISE INSULIN SYR/NEEDLE) 30G X 5/16" 0.5 ML MISC 1 each by Does not apply route 3 (three) times daily with meals. 02/17/20   Nicole Kindred A, DO  lisinopril (ZESTRIL) 2.5 MG tablet Take 2.5 mg by mouth every evening.     [provider]  metoprolol tartrate (LOPRESSOR) 25 MG tablet Take 25 mg by mouth 2 (two) times daily.     [provider]    Allergies Patient has no known allergies.  Family History  Problem Relation Age of Onset  . Diabetes Mother   . Diabetes Brother     Social History Social History   Tobacco Use  . Smoking status: Never Smoker  . Smokeless tobacco: Never Used  Vaping Use  . Vaping Use: Never used  Substance Use Topics  . Alcohol use: No  . Drug use: Never    Review of Systems  Constitutional: No fever/chills Eyes: No visual changes. ENT: No sore throat. Cardiovascular: Denies chest pain. Respiratory: Denies shortness of breath. Gastrointestinal: No abdominal pain.  No nausea, no vomiting.  No diarrhea.  No constipation. Genitourinary: Negative for dysuria. Musculoskeletal: Negative for back pain.  Positive for left hip pain. Skin: Negative for rash. Neurological: Negative for headaches, focal weakness or  numbness.  ____________________________________________   PHYSICAL EXAM:  VITAL SIGNS: ED Triage Vitals  Enc Vitals Group     BP 06/27/20 1147 (!) 143/96     Pulse Rate 06/27/20 1147 (!) 106     Resp 06/27/20 1147 18     Temp 06/27/20 1147 98.6 F (37 C)     Temp Source 06/27/20 1147 Oral     SpO2 06/27/20 1147 99 %     Weight 06/27/20 1149 125 lb (56.7 kg)     Height 06/27/20 1149 5' 6"  (1.676 m)     Head Circumference --      Peak Flow --      Pain Score 06/27/20 1148 10     Pain Loc --      Pain Edu? --      Excl. in [place]? --     Constitutional: Alert and oriented. Eyes: Conjunctivae are normal. Head: Atraumatic. Nose: No congestion/rhinnorhea. Mouth/Throat: Mucous membranes are moist. Neck: Normal ROM Cardiovascular: Normal rate, regular rhythm. Grossly normal heart sounds.  2+ DP pulses bilaterally. Respiratory: Normal respiratory effort.  No retractions. Lungs CTAB. Gastrointestinal: Soft and nontender. No distention. Genitourinary: deferred Musculoskeletal: Diffuse tenderness to left hip with no obvious deformity.  No tenderness noted to right hip, bilateral knees, or bilateral ankles. Neurologic:  Normal speech and language. No gross focal neurologic deficits are appreciated. Skin:  Skin is warm, dry and intact. No rash noted. Psychiatric: Mood and affect are normal. Speech and behavior are normal.  ____________________________________________   LABS (all labs ordered are listed, but only abnormal results are displayed)  Labs Reviewed  SARS CORONAVIRUS 2 (TAT 6-24 HRS)  CBC WITH DIFFERENTIAL/PLATELET  BASIC METABOLIC PANEL     PROCEDURES  Procedure(s) performed (including Critical Care):  Procedures   ____________________________________________   INITIAL IMPRESSION / ASSESSMENT AND PLAN / ED COURSE       59 year old female with past medical history of stroke, hypertension, diabetes, CKD, and cirrhosis who presents to the ED complaining of  left hip pain after a trip and fall.  She denies hitting her head or losing consciousness, is awake and alert with no focal neurologic deficits.  She does have diffuse tenderness to her left hip with no obvious deformity and is neurovascularly intact to her bilateral lower extremities.  X-ray shows no apparent fracture of left hip but does show superior and inferior pubic rami fractures on the left by my read.  We will treat patient's pain and attempt to ambulate, but if she is unable to do so may require admission for pain control as well as PT/OT.  Radiology read confirms displaced fractures of superior and inferior pubic rami on the left, no other acute fracture noted in the area of her hip or on chest x-ray.  Chest x-ray is negative for acute process.  Patient unable to ambulate and bear weight with these fractures despite dose of morphine and I have concerns about her ability to care for herself at home.  We will screen basic labs  and case discussed with hospitalist for admission.  I also spoke with Dr. Mack Guise of orthopedics, who agrees that patient may be weightbearing as tolerated, will be available for consultation as needed.      ____________________________________________   FINAL CLINICAL IMPRESSION(S) / ED DIAGNOSES  Final diagnoses:  Closed fracture of multiple pubic rami, left, initial encounter [facility])     ED Discharge Orders    None       Note:  This document was prepared using Dragon voice recognition software and may include unintentional dictation errors.   Blake Divine, MD 06/27/20 [phone]

## 2020-06-27 NOTE — ED Notes (Signed)
Urine sent to lab with [place] pt label

## 2020-06-27 NOTE — ED Triage Notes (Addendum)
Pt arrives via [facility] from home after "tripping over her feet" this morning while going to load the washing machine. Pt able to bear weight on leg to pivot to use the toilet but c/o pain. No obvious deformities observed by this RN. PT A&Ox4 and in NAD. PT arrives with IV in left arm placed by EMS

## 2020-06-28 ENCOUNTER — Other Ambulatory Visit: Payer: Self-pay

## 2020-06-28 DIAGNOSIS — N1832 Chronic kidney disease, stage 3b: Secondary | ICD-10-CM | POA: Diagnosis not present

## 2020-06-28 DIAGNOSIS — W19XXXD Unspecified fall, subsequent encounter: Secondary | ICD-10-CM

## 2020-06-28 DIAGNOSIS — E1165 Type 2 diabetes mellitus with hyperglycemia: Secondary | ICD-10-CM | POA: Diagnosis not present

## 2020-06-28 DIAGNOSIS — I10 Essential (primary) hypertension: Secondary | ICD-10-CM | POA: Diagnosis not present

## 2020-06-28 DIAGNOSIS — S32592A Other specified fracture of left pubis, initial encounter for closed fracture: Secondary | ICD-10-CM | POA: Diagnosis not present

## 2020-06-28 LAB — BASIC METABOLIC PANEL
Anion gap: 11 (ref 5–15)
BUN: 44 mg/dL — ABNORMAL HIGH (ref 6–20)
CO2: 27 mmol/L (ref 22–32)
Calcium: 9.3 mg/dL (ref 8.9–10.3)
Chloride: 103 mmol/L (ref 98–111)
Creatinine, Ser: 1.6 mg/dL — ABNORMAL HIGH (ref 0.44–1.00)
GFR calc Af Amer: 40 mL/min — ABNORMAL LOW (ref >60)
GFR calc non Af Amer: 35 mL/min — ABNORMAL LOW (ref >60)
Glucose, Bld: 304 mg/dL — ABNORMAL HIGH (ref 70–99)
Potassium: 4 mmol/L (ref 3.5–5.1)
Sodium: 141 mmol/L (ref 135–145)

## 2020-06-28 LAB — GLUCOSE, CAPILLARY
Glucose-Capillary: 39 mg/dL — CL (ref 70–99)
Glucose-Capillary: 190 mg/dL — ABNORMAL HIGH (ref 70–99)
Glucose-Capillary: 350 mg/dL — ABNORMAL HIGH (ref 70–99)
Glucose-Capillary: 172 mg/dL — ABNORMAL HIGH (ref 70–99)
Glucose-Capillary: 154 mg/dL — ABNORMAL HIGH (ref 70–99)
Glucose-Capillary: 215 mg/dL — ABNORMAL HIGH (ref 70–99)
Glucose-Capillary: 145 mg/dL — ABNORMAL HIGH (ref 70–99)

## 2020-06-28 MED ORDER — DEXTROSE 50 % IV SOLN: 25.0000 g | INTRAVENOUS | Status: AC

## 2020-06-28 MED ORDER — DEXTROSE 50 % IV SOLN
INTRAVENOUS | Status: AC
  Administered 2020-06-28: 25 g via INTRAVENOUS
  Filled 2020-06-28: qty 50

## 2020-06-28 NOTE — Consult Note (Signed)
ORTHOPAEDIC CONSULTATION  REQUESTING PHYSICIAN: Nolberto Hanlon, MD  Chief Complaint: Left hip/pelvis pain status post fall  HPI: Susan Navarro is a 59 y.o. female who complains of left-sided hip/pelvis pain status post fall.  She denies numbness and tingling left lower extremity.  Patient is diagnosed with a superior and inferior pubic rami fractures by x-ray in the emergency department upon presentation.  Orthopedics is consulted for management of her fractures.  Past Medical History:  Diagnosis Date  . Anemia   . Chronic kidney disease   . Diabetes mellitus without complication ([place])   . Hypertension   . Stroke [facility]) 03/2018   Ischemic stroke per [facility] records   History reviewed. No pertinent surgical history. Social History   Socioeconomic History  . Marital status: Single    Spouse name: Not on file  . Number of children: Not on file  . Years of education: Not on file  . Highest education level: Not on file  Occupational History  . Not on file  Tobacco Use  . Smoking status: Never Smoker  . Smokeless tobacco: Never Used  Vaping Use  . Vaping Use: Never used  Substance and Sexual Activity  . Alcohol use: No  . Drug use: Never  . Sexual activity: Not on file  Other Topics Concern  . Not on file  Social History Narrative   Lives at home with daughter   Social Determinants of Health   Financial Resource Strain:   . Difficulty of Paying Living Expenses:   Food Insecurity:   . Worried About Charity fundraiser in the Last Year:   . Arboriculturist in the Last Year:   Transportation Needs:   . Film/video editor (Medical):   Marland Kitchen Lack of Transportation (Non-Medical):   Physical Activity:   . Days of Exercise per Week:   . Minutes of Exercise per Session:   Stress:   . Feeling of Stress :   Social Connections:   . Frequency of Communication with Friends and Family:   . Frequency of Social Gatherings with Friends and Family:   . Attends Religious Services:   .  Active Member of Clubs or Organizations:   . Attends Archivist Meetings:   Marland Kitchen Marital Status:    Family History  Problem Relation Age of Onset  . Diabetes Mother   . Diabetes Brother    No Known Allergies Prior to Admission medications   Medication Sig Start Date End Date Taking? Authorizing Provider  amLODipine (NORVASC) 5 MG tablet Take 5 mg by mouth daily. 12/15/19  Yes [provider]  aspirin EC 81 MG tablet Take 81 mg by mouth daily. 02/09/17  Yes [provider]  atorvastatin (LIPITOR) 80 MG tablet Take 80 mg by mouth daily. 05/24/20  Yes [provider]  Insulin Glargine (LANTUS SOLOSTAR) 100 UNIT/ML Solostar Pen Inject 15 Units into the skin daily. 02/17/20  Yes Nicole Kindred A, DO  insulin lispro (HUMALOG KWIKPEN) 100 UNIT/ML KwikPen Inject 0.02-0.03 mLs (2-3 Units total) into the skin 3 (three) times daily before meals. 02/17/20  Yes Nicole Kindred A, DO  lisinopril (ZESTRIL) 2.5 MG tablet Take 2.5 mg by mouth every evening.    Yes [provider]  metoprolol tartrate (LOPRESSOR) 25 MG tablet Take 25 mg by mouth 2 (two) times daily.    Yes [provider]  Ensure Max Protein (ENSURE MAX PROTEIN) LIQD Take 330 mLs (11 oz total) by mouth 2 (two) times daily between  meals. 12/30/19   Dhungel, Flonnie Overman, MD  Insulin Syringe-Needle U-100 (HEALTHWISE INSULIN SYR/NEEDLE) 30G X 5/16" 0.5 ML MISC 1 each by Does not apply route 3 (three) times daily with meals. 02/17/20   Ezekiel Slocumb, DO   DG Chest 1 View  Result Date: 06/27/2020 CLINICAL DATA:  Fall. EXAM: CHEST  1 VIEW COMPARISON:  October 07, 2019. FINDINGS: The heart size and mediastinal contours are within normal limits. Both lungs are clear. No pneumothorax or pleural effusion is noted. The visualized skeletal structures are unremarkable. IMPRESSION: No active disease. Electronically Signed   By: Marijo Conception M.D.   On: 06/27/2020 12:53   DG Hip Unilat With Pelvis 2-3 Views  Left  Result Date: 06/27/2020 CLINICAL DATA:  Left hip pain after fall today. EXAM: DG HIP (WITH OR WITHOUT PELVIS) 2-3V LEFT COMPARISON:  None. FINDINGS: Moderately displaced fractures are seen involving the left superior and inferior pubic rami. No other fracture or dislocation is noted the hip joints are unremarkable. Phleboliths and intrauterine device are noted. IMPRESSION: Moderately displaced left superior and inferior pubic rami fractures. Electronically Signed   By: Marijo Conception M.D.   On: 06/27/2020 12:52    Positive ROS: All other systems have been reviewed and were otherwise negative with the exception of those mentioned in the HPI and as above.  Physical Exam: General: Alert, no acute distress  MUSCULOSKELETAL: Left lower extremity: Patient has no significant swelling around her pelvis or hip.  Her thigh and leg compartments are soft and compressible.  She has no shortening or external rotation to her left lower extremity.  Patient has intact sensation light touch palpable pedal pulses and intact motor function distally.  Assessment: Left superior and inferior pubic rami fractures  Plan: I reviewed the patient's x-rays.  Patient has a mildly displaced superior and inferior pubic rami fractures.  This will not require surgical intervention.  Patient is weightbearing as tolerated on the left lower extremity.  She will require a walker for assistance with ambulation.  She should be evaluated by physical therapy and may need placement.  Patient may follow-up in our orthopedic clinic in approximately 2 to 4 weeks for reevaluation and x-ray.    [place]ornton [place], MD    06/28/2020 11:41 AM

## 2020-06-28 NOTE — Care Management CC44 (Signed)
Condition Code 44 Documentation Completed  Patient Details  Name: Susan Navarro MRN: [id number] Date of Birth: 12-Jun-1961   Condition Code 44 given:  Yes Patient signature on Condition Code 44 notice:  Yes Documentation of 2 MD's agreement:  Yes Code 44 added to claim:  Yes    Anselm Pancoast, RN 06/28/2020, 10:46 AM

## 2020-06-28 NOTE — Evaluation (Signed)
Physical Therapy Evaluation Patient Details Name: Susan Navarro MRN: [id number] DOB: March 24, 1961 Today's Date: 06/28/2020   History of Present Illness  59 y.o. female with medical history significant of chronic kidney disease, anemia, diabetes mellitus without complication, hypertension, stroke presents status post fall.  Imaging reveals L moderately displaced left superior and inferior pubic rami fractures.  Clinical Impression  Pt pleasant and eager to work with PT, on arrival she was in bed and not moving - reports no pain; this quickly changed with movement but she remained motivated to get up and see what she could do.  Ultimately she remained quite pain limited in L hip t/o the session but showed gradual increase in activity tolerance with increased WBing and activity.  She was able to get herself to sitting EOB, but needed UEs to move L LE and could not bend down to don/tie shoes (reports she wears slip-ons most of the time).  She was able to rise to standing w/o direct assist, but essentially maintained TTWBing only on the L.  With cues to use UEs appropriately on walker (complicated with R hand pain [fall related] along with chronic L hand weakness) and try to test WBing tolerance on the L she did improve with gait, tolerance and speed (though still very slow) with increased cuing and distance with ambulation.  Ultimately she showed ability to walker room to room in the home w/o direct assist and she does have a motorized scooter at home should she need quicker or considerably longer bouts of ambulation.  Pt reports that her sister lives close and could help out as needed when she first goes home.     Follow Up Recommendations Home health PT    Equipment Recommendations  3in1 (PT) (per previous notes pt has rolling walker)    Recommendations for Other Services       Precautions / Restrictions Precautions Precautions: Fall Restrictions Weight Bearing Restrictions: Yes LLE Weight Bearing:  Weight bearing as tolerated      Mobility  Bed Mobility Overal bed mobility: Needs Assistance Bed Mobility: Sit to Supine;Supine to Sit     Supine to sit: Min guard Sit to supine: Min assist   General bed mobility comments: Pt needed to use UEs to assist L LE to/off EOB but did not need direct assist, did need assist to lift L LE back into bed post ambulation  Transfers Overall transfer level: Needs assistance Equipment used: Rolling walker (2 wheeled) Transfers: Sit to/from Stand Sit to Stand: Min guard         General transfer comment: Pt was able to rise w/o direct PT assist but did require heavy UE assist and plenty of cuing  Ambulation/Gait Ambulation/Gait assistance: Min guard Gait Distance (Feet): 40 Feet Assistive device: Rolling walker (2 wheeled)     Gait velocity interpretation: <1.31 ft/sec, indicative of household ambulator General Gait Details: Pt initially with little to no WBing tolerance on the L, she was able to gradually increase heel down/WBing with cuing and plenty of UE WBing through walker, complicated due to R hand pain (fall associated) with chronic L hand weakness 2/2 CVA.  Pt was, of course, heavily reliant on walker and had as much issues with UE WBing tolerance as transitioning to L LE WBing.  Slow, labored, but no LOBs or overt safety issues t/o the effort.  Stairs            Wheelchair Mobility    Modified Rankin (Stroke Patients Only)  Balance Overall balance assessment: Needs assistance   Sitting balance-Leahy Scale: Good     Standing balance support: Bilateral upper extremity supported Standing balance-Leahy Scale: Fair Standing balance comment: highly reliant on walker/UEs, limited L LE WBing tolerance                             Pertinent Vitals/Pain Pain Assessment: 0-10 Pain Score: 8  Pain Location: denies pain at rest, c/o most pain in L thigh rather than groin or elsewhere    Home Living  Family/patient expects to be discharged to:: Private residence Living Arrangements: Alone Available Help at Discharge: Family;Friend(s) Type of Home: Apartment Home Access: Stairs to enter Entrance Stairs-Rails: None Entrance Stairs-Number of Steps: 1 curb Home Layout: One level Home Equipment: Youth worker - 2 wheels;Cane - single point;Toilet riser      Prior Function Level of Independence: Independent         Comments: reports she was independent with all mobility and used the motorized cart when fatigued     Hand Dominance        Extremity/Trunk Assessment   Upper Extremity Assessment Upper Extremity Assessment: Overall WFL for tasks assessed (functional but decreased L hand grip 2/2 chronic CVA)    Lower Extremity Assessment Lower Extremity Assessment: LLE deficits/detail (R LE grossly 4-/5 with only mild pain during activity) LLE Deficits / Details: pain limited, unable to lift against gravity, grossly 2/5 in hip       Communication   Communication: No difficulties  Cognition Arousal/Alertness: Awake/alert Behavior During Therapy: WFL for tasks assessed/performed Overall Cognitive Status: Within Functional Limits for tasks assessed                                        General Comments      Exercises     Assessment/Plan    PT Assessment Patient needs continued PT services  PT Problem List Decreased strength;Decreased activity tolerance;Decreased range of motion;Decreased mobility;Decreased balance;Decreased knowledge of use of DME;Decreased safety awareness;Pain       PT Treatment Interventions DME instruction;Gait training;Stair training;Functional mobility training;Therapeutic activities;Therapeutic exercise;Balance training;Neuromuscular re-education;Patient/family education    PT Goals (Current goals can be found in the Care Plan section)  Acute Rehab PT Goals Patient Stated Goal: go home PT Goal Formulation: With  patient Time For Goal Achievement: 07/12/20 Potential to Achieve Goals: Fair    Frequency Min 2X/week   Barriers to discharge        Co-evaluation               AM-PAC PT "6 Clicks" Mobility  Outcome Measure Help needed turning from your back to your side while in a flat bed without using bedrails?: None Help needed moving from lying on your back to sitting on the side of a flat bed without using bedrails?: A Little Help needed moving to and from a bed to a chair (including a wheelchair)?: A Little Help needed standing up from a chair using your arms (e.g., wheelchair or bedside chair)?: A Little Help needed to walk in hospital room?: A Little Help needed climbing 3-5 steps with a railing? : A Lot 6 Click Score: 18    End of Session Equipment Utilized During Treatment: Gait belt Activity Tolerance: Patient limited by pain;Patient limited by fatigue Patient left: with call bell/phone within reach;in chair (on [facility], nursing  aware) Nurse Communication: Mobility status;Patient requests pain meds PT Visit Diagnosis: Muscle weakness (generalized) (M62.81);Difficulty in walking, not elsewhere classified (R26.2);Pain;Other abnormalities of gait and mobility (R26.89) Pain - Right/Left: Left Pain - part of body: Hip    Time: 0301-4996 PT Time Calculation (min) (ACUTE ONLY): 32 min   Charges:   PT Evaluation $PT Eval Low Complexity: 1 Low PT Treatments $Gait Training: 8-22 mins        Kreg Shropshire, DPT 06/28/2020, 4:30 PM

## 2020-06-28 NOTE — TOC Initial Note (Signed)
Transition of Care [facility]) - Initial/Assessment Note    Patient Details  Name: Susan Navarro MRN: [id number] Date of Birth: 1961/11/06  [facility]) CM/SW Contact:    Anselm Pancoast, RN Phone Number: 06/28/2020, 3:59 PM  Clinical Narrative:                 Spoke with patient at bedside. Patient lives alone and has a sister who lives 10 miles away and assists as needed. Anticipate patient will need HHC, walker and 3-1 at discharge. Patient states her pain is well controlled right now and she has no other needs at this time.    Expected Discharge Plan: [place] Barriers to Discharge: Continued Medical Work up   Patient Goals and CMS Choice Patient states their goals for this hospitalization and ongoing recovery are:: Get back home      Expected Discharge Plan and Services Expected Discharge Plan: [place]       Living arrangements for the past 2 months: Single Family Home                                      Prior Living Arrangements/Services Living arrangements for the past 2 months: Single Family Home Lives with:: Self Patient language and need for interpreter reviewed:: Yes Do you feel safe going back to the place where you live?: Yes      Need for Family Participation in Patient Care: Yes (Comment) Care giver support system in place?: Yes (comment)   Criminal Activity/Legal Involvement Pertinent to Current Situation/Hospitalization: No - Comment as needed  Activities of Daily Living      Permission Sought/Granted Permission sought to share information with : Facility Art therapist granted to share information with : Yes, Verbal Permission Granted  Share Information with NAME: Home Health Agencies           Emotional Assessment Appearance:: Appears stated age Attitude/Demeanor/Rapport: Engaged Affect (typically observed): Accepting Orientation: : Oriented to Place, Oriented to  Time,  Oriented to Situation, Oriented to Self Alcohol / Substance Use: Never Used Psych Involvement: No (comment)  Admission diagnosis:  Fall [W19.XXXA] Pre-op chest exam [U31.497] Closed fracture of multiple pubic rami, left, initial encounter [facility]) [S32.592A] Patient Active Problem List   Diagnosis Date Noted  . Fall 06/27/2020  . HTN (hypertension) 02/16/2020  . CKD (chronic kidney disease), stage IV ([place]) 12/30/2019  . Other cirrhosis of liver ([place]) 12/30/2019  . Thrombocytopenia ([place]) 12/30/2019  . 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 Transaminitis 12/30/2019  . Malnutrition of moderate degree 12/28/2019  . Anemia of chronic disease   . Generalized weakness   . Altered mental status   . DKA (diabetic ketoacidoses) ([place]) 12/25/2019  . Hyponatremia   . Hyperkalemia   . Stroke ([place]) 03/2018   PCP:  Rochel Brome, MD Pharmacy:   [facility], [place] [address] [place] [address] Phone: [phone] Fax: [phone]  [place], [place] [address] Phone: [phone] Fax: [phone]     Social Determinants of Health (SDOH) Interventions    Readmission Risk Interventions Readmission Risk Prevention Plan 12/28/2019  Transportation Screening Complete  HRI or  Home Care Consult Complete  Palliative Care Screening Not Applicable  Medication Review (RN Care Manager) Referral to Pharmacy  Some recent data might be hidden

## 2020-06-28 NOTE — Progress Notes (Signed)
PROGRESS NOTE    Susan Navarro  SAY:[id number] DOB: [birth year] DOA: 06/27/2020 PCP: Rochel Brome, MD    Brief Narrative:  Susan Navarro is a 59 y.o. female with medical history significant of chronic kidney disease, anemia, diabetes mellitus without complication, hypertension, stroke presents status post mechanical fall today.  Patient was going  To load washer and tripped and fell forward. She did not have any dizziness, lightheadedness, syncope or presyncopal episodes.  No chest pain or shortness of breath.  She believes her foot was placed in the wrong way.  She was complaining of pain upon arrival.  Denies hitting her head or losing consciousness.    Consultants:  Orthopedics   procedures:   Antimicrobials:       Subjective: Pain controlled, but not ambulating yet. While in bed no pain. Reports needing walker. No sob, cp abd pain  Objective: Vitals:   06/27/20 2000 06/27/20 2200 06/28/20 0400 06/28/20 0822  BP: (!) 152/74 (!) 158/75 (!) 112/59 (!) 141/78  Pulse: 93 94 71 78  Resp: 16  16 16   Temp: 98.2 F (36.8 C)  (!) 97.5 F (36.4 C) 98.2 F (36.8 C)  TempSrc: Oral  Oral Oral  SpO2: 99% 99% 100% 100%  Weight:      Height:        Intake/Output Summary (Last 24 hours) at 06/28/2020 0830 Last data filed at 06/28/2020 0500 Gross per 24 hour  Intake --  Output 500 ml  Net -500 ml   Filed Weights   06/27/20 1149  Weight: 56.7 kg    Examination:  General exam: Appears calm and comfortable , nad sitting in bed Respiratory system: Clear to auscultation. Respiratory effort normal. Cardiovascular system: S1 & S2 heard, RRR. No JVD, murmurs, rubs, gallops or clicks. No pedal edema. Gastrointestinal system: Abdomen is nondistended, soft and nontender.no wheeze rales rhonchi's Normal bowel sounds heard. Central nervous system: Alert and oriented x3.  Grossly intact Extremities: no edema Skin: warm, dry Psychiatry: Judgement and insight appear normal. Mood &  affect appropriate in current setting.     Data Reviewed: I have personally reviewed following labs and imaging studies  CBC: Recent Labs  Lab 06/27/20 1512  WBC 5.2  NEUTROABS 4.3  HGB 10.1*  HCT 29.4*  MCV 90.5  PLT 75*   Basic Metabolic Panel: Recent Labs  Lab 06/27/20 1512 06/28/20 0638  NA 141 141  K 4.0 4.0  CL 107 103  CO2 23 27  GLUCOSE 147* 304*  BUN 42* 44*  CREATININE 1.56*  1.55* 1.60*  CALCIUM 8.8* 9.3   GFR: Estimated Creatinine Clearance: 33.5 mL/min (A) (by C-G formula based on SCr of 1.6 mg/dL (H)). Liver Function Tests: No results for input(s): AST, ALT, ALKPHOS, BILITOT, PROT, ALBUMIN in the last 168 hours. No results for input(s): LIPASE, AMYLASE in the last 168 hours. No results for input(s): AMMONIA in the last 168 hours. Coagulation Profile: No results for input(s): INR, PROTIME in the last 168 hours. Cardiac Enzymes: No results for input(s): CKTOTAL, CKMB, CKMBINDEX, TROPONINI in the last 168 hours. BNP (last 3 results) No results for input(s): PROBNP in the last 8760 hours. HbA1C: Recent Labs    06/27/20 1512  HGBA1C 10.6*   CBG: Recent Labs  Lab 06/27/20 2003 06/27/20 2318 06/28/20 0411 06/28/20 0448 06/28/20 0820  GLUCAP 133* 126* 39* 190* 350*   Lipid Profile: No results for input(s): CHOL, HDL, LDLCALC, TRIG, CHOLHDL, LDLDIRECT in the last 72 hours. Thyroid Function Tests: No  results for input(s): TSH, T4TOTAL, FREET4, T3FREE, THYROIDAB in the last 72 hours. Anemia Panel: No results for input(s): VITAMINB12, FOLATE, FERRITIN, TIBC, IRON, RETICCTPCT in the last 72 hours. Sepsis Labs: No results for input(s): PROCALCITON, LATICACIDVEN in the last 168 hours.  Recent Results (from the past 240 hour(s))  SARS CORONAVIRUS 2 (TAT 6-24 HRS) Nasopharyngeal Nasopharyngeal Swab     Status: None   Collection Time: 06/27/20  4:47 PM   Specimen: Nasopharyngeal Swab  Result Value Ref Range Status   SARS Coronavirus 2 NEGATIVE  NEGATIVE Final    Comment: (NOTE) SARS-CoV-2 target nucleic acids are NOT DETECTED.  The SARS-CoV-2 RNA is generally detectable in upper and lower respiratory specimens during the acute phase of infection. Negative results do not preclude SARS-CoV-2 infection, do not rule out co-infections with other pathogens, and should not be used as the sole basis for treatment or other patient management decisions. Negative results must be combined with clinical observations, patient history, and epidemiological information. The expected result is Negative.  Fact Sheet for Patients: SugarRoll.be  Fact Sheet for Healthcare Providers: https://www.woods-mathews.com/  This test is not yet approved or cleared by the [place] FDA and  has been authorized for detection and/or diagnosis of SARS-CoV-2 by FDA under an Emergency Use Authorization (EUA). This EUA will remain  in effect (meaning this test can be used) for the duration of the COVID-19 declaration under Se ction 564(b)(1) of the Act, 21 U.S.C. section 360bbb-3(b)(1), unless the authorization is terminated or revoked sooner.  Performed at [facility], [place] [address], [place], [place] [address]          Radiology Studies: DG Chest 1 View  Result Date: 06/27/2020 CLINICAL DATA:  Fall. EXAM: CHEST  1 VIEW COMPARISON:  October 07, 2019. FINDINGS: The heart size and mediastinal contours are within normal limits. Both lungs are clear. No pneumothorax or pleural effusion is noted. The visualized skeletal structures are unremarkable. IMPRESSION: No active disease. Electronically Signed   By: Marijo Conception M.D.   On: 06/27/2020 12:53   DG Hip Unilat With Pelvis 2-3 Views Left  Result Date: 06/27/2020 CLINICAL DATA:  Left hip pain after fall today. EXAM: DG HIP (WITH OR WITHOUT PELVIS) 2-3V LEFT COMPARISON:  None. FINDINGS: Moderately displaced fractures are seen involving the left superior  and inferior pubic rami. No other fracture or dislocation is noted the hip joints are unremarkable. Phleboliths and intrauterine device are noted. IMPRESSION: Moderately displaced left superior and inferior pubic rami fractures. Electronically Signed   By: Marijo Conception M.D.   On: 06/27/2020 12:52        Scheduled Meds: . amLODipine  5 mg Oral Daily  . aspirin EC  81 mg Oral Daily  . heparin  5,000 Units Subcutaneous Q8H  . insulin aspart  0-15 Units Subcutaneous Q4H  . insulin glargine  15 Units Subcutaneous Daily  . lidocaine  1 patch Transdermal Q24H  . lisinopril  2.5 mg Oral QPM  . metoprolol tartrate  25 mg Oral BID  . multivitamin with minerals  1 tablet Oral Daily   Continuous Infusions:  Assessment & Plan:   Active Problems:   Fall   1. S/p mechanical fall - found with Moderately displaced left superior and inferior pubic rami Fractures.  Orthopedics -was appreciated -not require surgical intervention  Patient is weightbearing as tolerated on the left lower extremity.  She will require a walker for assistance with ambulation  follow-up in our orthopedic  clinic in approximately 2 to 4 weeks for reevaluation and x-ray. PT consulted Pain mx Lidocaine patch   2. Essential HTN- mildly elevated, likely pain attributing Will resume home medications after reconciliation  3. IDDM II- uncontrolled based on previous HA1c 14. Will resume home lantus dose riss Ck fs HA1c 10.6  4. 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 home  Status is: Inpatient  Not inpatient appropriate, will call UM team and downgrade to OBS.   Dispo: The patient is from: Home              Anticipated d/c is to: home v.s. SNF, PT pending              Anticipated d/c date is: 1 day              Patient currently is not medically stable to d/c.Ssafe d/c planning needed, PT evaluation for home v.s.  SNF            LOS: 1 day   Time spent: 45 min with >50% on coc    Nolberto Hanlon, MD [facility] Pager [phone]  If 7PM-7AM, please contact night-coverage [email or web address] Password [email or web address] 06/28/2020, 8:30 AM

## 2020-06-28 NOTE — Care Management Obs Status (Signed)
[facility] NOTIFICATION   Patient Details  Name: Susan Navarro MRN: [id number] Date of Birth: [birth year]   Medicare Observation Status Notification Given:  Yes    Anselm Pancoast, RN 06/28/2020, 10:46 AM

## 2020-06-28 NOTE — ED Notes (Signed)
Pt assisted to have BM on bedside commode and moved safely back onto stretcher

## 2020-06-29 DIAGNOSIS — S32592A Other specified fracture of left pubis, initial encounter for closed fracture: Secondary | ICD-10-CM | POA: Diagnosis not present

## 2020-06-29 DIAGNOSIS — E1165 Type 2 diabetes mellitus with hyperglycemia: Secondary | ICD-10-CM | POA: Diagnosis not present

## 2020-06-29 DIAGNOSIS — W19XXXD Unspecified fall, subsequent encounter: Secondary | ICD-10-CM | POA: Diagnosis not present

## 2020-06-29 DIAGNOSIS — N183 Chronic kidney disease, stage 3 unspecified: Secondary | ICD-10-CM

## 2020-06-29 DIAGNOSIS — I10 Essential (primary) hypertension: Secondary | ICD-10-CM | POA: Diagnosis not present

## 2020-06-29 LAB — GLUCOSE, CAPILLARY
Glucose-Capillary: 104 mg/dL — ABNORMAL HIGH (ref 70–99)
Glucose-Capillary: 190 mg/dL — ABNORMAL HIGH (ref 70–99)
Glucose-Capillary: 179 mg/dL — ABNORMAL HIGH (ref 70–99)

## 2020-06-29 MED ORDER — LIDOCAINE 5 % EX PTCH: 1.0000 | MEDICATED_PATCH | CUTANEOUS | 0 refills | Status: DC

## 2020-06-29 MED ORDER — ADULT MULTIVITAMIN W/MINERALS CH: 1.0000 | ORAL_TABLET | Freq: Every day | ORAL | 0 refills | Status: AC

## 2020-06-29 MED ORDER — HYDROCODONE-ACETAMINOPHEN 5-325 MG PO TABS: 1.0000 | ORAL_TABLET | ORAL | 0 refills | Status: AC | PRN

## 2020-06-29 NOTE — TOC Progression Note (Addendum)
Transition of Care [facility]) - Progression Note    Patient Details  Name: Susan Navarro MRN: [id number] Date of Birth: 1961-05-20  Transition of Care [facility]) CM/SW Contact  Su Hilt, RN Phone Number: 06/29/2020, 2:20 PM  Clinical Narrative:     Spoke with the patient and explained that if she needed transportation to get Home I would be happy to set it up, she has [place] set up and DME at home including a RW and a BSC She said she lives alone, I explained the recommendation was home health, She could go to SNF but insurance may decline that she has no skilled need for Rehab and it would be out of pocket, I explained that if she stays after DC it is out of pocket expense She said she does not want transport and she will go sit out front, I explained that is not ok, she said she does not want transport and she will sit out front, I explained that could be considered trespassing, I encouraged her to let me set up a ride thru [employer], she declined  Expected Discharge Plan: [place] Barriers to Discharge: Continued Medical Work up  Expected Discharge Plan and Services Expected Discharge Plan: [place] arrangements for the past 2 months: Single Family Home Expected Discharge Date: 06/29/20               DME Arranged: [place] Rile rolling with seat, 3-N-1 DME Agency: [employer] Date DME Agency Contacted: 06/29/20 Time DME Agency Contacted: [phone] Representative spoke with at DME Agency: Edwyna Ready [place]: PT [place]: [facility] Date [place]: 06/29/20 Time [place]: [phone] Representative spoke with at [place]: [place] ([facility]) Interventions    Readmission Risk Interventions Readmission Risk Prevention Plan 12/28/2019  Transportation Screening Complete  [place] or [place] Complete  Palliative Care Screening Not Applicable   Medication Review (RN Care Manager) Referral to Pharmacy  Some recent data might be hidden

## 2020-06-29 NOTE — Discharge Summary (Signed)
Susan Navarro ZOX:[id number] DOB: 04-30-1961 DOA: 06/27/2020  PCP: Rochel Brome, MD  Admit date: 06/27/2020 Discharge date: 06/29/2020  Admitted From: home Disposition:  home  Recommendations for Outpatient Follow-up:  1. Follow up with PCP in 1 week 2. Please obtain BMP/CBC in one week   Home Health:PT    Discharge Condition:Stable CODE STATUS: Full Diet recommendation: Carb modified Brief/Interim Summary: Susan Navarro is a 58 y.o. female with medical history significant of chronic kidney disease, anemia, diabetes mellitus without complication, hypertension, stroke presents status post mechanical fall .Left hip chest x-ray reveals Moderately displaced left superior and inferior pubic rami Fractures..   1. S/p mechanical fall - found withModerately displaced left superior and inferior pubic rami Fractures.  Orthopedics was consulted during her hospitalization-not require surgical intervention Patient is weightbearing as tolerated on the left lower extremity. She will require a walker for assistance with ambulation follow-up in our orthopedic clinic in approximately 2 to 4 weeks for reevaluation and x-ray. Home PT Pain management Lidocaine patch   2. Essential HTN-was mildly elevated, likely pain attributing Continue home medications on discharge  3. IDDM II- uncontrolled based on previous HA1c 14. Continue Lantus riss Ck fs HA1c 10.6 Discussed with patient about her diabetes being uncontrolled.  Needs to follow-up with her primary care for further management.  4. CKD IIIb- at baseline    Discharge Diagnoses:  Active Problems:   Fall    Discharge Instructions  Discharge Instructions    Call MD for:  severe uncontrolled pain   Complete by: As directed    Diet - low sodium heart healthy   Complete by: As directed    Discharge instructions   Complete by: As directed    F/u with pcp in one week F/u with Dr. Mack Guise orthopedics in one week    Increase activity slowly   Complete by: As directed      Allergies as of 06/29/2020   No Known Allergies     Medication List    STOP taking these medications   metoprolol tartrate 25 MG tablet Commonly known as: LOPRESSOR     TAKE these medications   amLODipine 5 MG tablet Commonly known as: NORVASC Take 5 mg by mouth daily.   aspirin EC 81 MG tablet Take 81 mg by mouth daily.   atorvastatin 80 MG tablet Commonly known as: LIPITOR Take 80 mg by mouth daily. Notes to patient: Not given this hospitalization   Ensure Max Protein Liqd Take 330 mLs (11 oz total) by mouth 2 (two) times daily between meals. Notes to patient: Not given this hospitalization   HYDROcodone-acetaminophen 5-325 MG tablet Commonly known as: NORCO/VICODIN Take 1 tablet by mouth every 4 (four) hours as needed for up to 5 days for moderate pain.   insulin lispro 100 UNIT/ML KwikPen Commonly known as: HumaLOG KwikPen Inject 0.02-0.03 mLs (2-3 Units total) into the skin 3 (three) times daily before meals.   Insulin Syringe-Needle U-100 30G X 5/16" 0.5 ML Misc Commonly known as: HealthWise Insulin Syr/Needle 1 each by Does not apply route 3 (three) times daily with meals.   Lantus SoloStar 100 UNIT/ML Solostar Pen Generic drug: insulin glargine Inject 15 Units into the skin daily.   lidocaine 5 % Commonly known as: LIDODERM Place 1 patch onto the skin daily. Remove & Discard patch within 12 hours or as directed by MD   lisinopril 2.5 MG tablet Commonly known as: ZESTRIL Take 2.5 mg by mouth every evening.   multivitamin with minerals  Tabs tablet Take 1 tablet by mouth daily. Start taking on: June 30, 2020            Durable Medical Equipment  (From admission, onward)         Start     Ordered   06/29/20 1013  For home use only DME Bedside commode  Once       Question:  Patient needs a bedside commode to treat with the following condition  Answer:  Fracture   06/29/20 1013    06/29/20 0943  For home use only DME 4 wheeled rolling walker with seat  Once       Question:  Patient needs a walker to treat with the following condition  Answer:  Weakness   06/29/20 0946   06/28/20 1239  For home use only DME Walker  Once       Question:  Patient needs a walker to treat with the following condition  Answer:  Fracture   06/28/20 1238          Follow-up Information    Th[place]ton [place], MD On 07/30/2020.   Specialty: Orthopedic Surgery Why: (Office will call when earler date appt is available)  @ 10:00 am Contact information: [place] [address] [phone]        Rochel Brome, MD In 1 week.   Specialty: Internal Medicine Why: Patient will call office for Appt.              No Known Allergies  Consultations:  Orthopedics   Procedures/Studies: DG Chest 1 View  Result Date: 06/27/2020 CLINICAL DATA:  Fall. EXAM: CHEST  1 VIEW COMPARISON:  October 07, 2019. FINDINGS: The heart size and mediastinal contours are within normal limits. Both lungs are clear. No pneumothorax or pleural effusion is noted. The visualized skeletal structures are unremarkable. IMPRESSION: No active disease. Electronically Signed   By: Marijo Conception M.D.   On: 06/27/2020 12:53   DG Hip Unilat With Pelvis 2-3 Views Left  Result Date: 06/27/2020 CLINICAL DATA:  Left hip pain after fall today. EXAM: DG HIP (WITH OR WITHOUT PELVIS) 2-3V LEFT COMPARISON:  None. FINDINGS: Moderately displaced fractures are seen involving the left superior and inferior pubic rami. No other fracture or dislocation is noted the hip joints are unremarkable. Phleboliths and intrauterine device are noted. IMPRESSION: Moderately displaced left superior and inferior pubic rami fractures. Electronically Signed   By: Marijo Conception M.D.   On: 06/27/2020 12:52       Subjective:   Discharge Exam: Vitals:   06/29/20 0412 06/29/20 0744  BP: (!) 104/54 (!) 107/52  Pulse: 79 73   Resp: 17 18  Temp: (!) 97.4 F (36.3 C) 98.7 F (37.1 C)  SpO2: 99% 98%   Vitals:   06/28/20 2106 06/28/20 2318 06/29/20 0412 06/29/20 0744  BP: 110/60 (!) 112/59 (!) 104/54 (!) 107/52  Pulse: 79 80 79 73  Resp: 18 18 17 18   Temp: 97.9 F (36.6 C) 98.6 F (37 C) (!) 97.4 F (36.3 C) 98.7 F (37.1 C)  TempSrc: Oral Oral Oral Oral  SpO2: 99% 100% 99% 98%  Weight:      Height:        General: Pt is alert, awake, not in acute distress Cardiovascular: RRR, S1/S2 +, no rubs, no gallops Respiratory: CTA bilaterally, no wheezing, no rhonchi Abdominal: Soft, NT, ND, bowel sounds + Extremities: no edema, no cyanosis    The results of significant  diagnostics from this hospitalization (including imaging, microbiology, ancillary and laboratory) are listed below for reference.     Microbiology: Recent Results (from the past 240 hour(s))  SARS CORONAVIRUS 2 (TAT 6-24 HRS) Nasopharyngeal Nasopharyngeal Swab     Status: None   Collection Time: 06/27/20  4:47 PM   Specimen: Nasopharyngeal Swab  Result Value Ref Range Status   SARS Coronavirus 2 NEGATIVE NEGATIVE Final    Comment: (NOTE) SARS-CoV-2 target nucleic acids are NOT DETECTED.  The SARS-CoV-2 RNA is generally detectable in upper and lower respiratory specimens during the acute phase of infection. Negative results do not preclude SARS-CoV-2 infection, do not rule out co-infections with other pathogens, and should not be used as the sole basis for treatment or other patient management decisions. Negative results must be combined with clinical observations, patient history, and epidemiological information. The expected result is Negative.  Fact Sheet for Patients: SugarRoll.be  Fact Sheet for Healthcare Providers: https://www.woods-mathews.com/  This test is not yet approved or cleared by the [place] FDA and  has been authorized for detection and/or diagnosis of SARS-CoV-2  by FDA under an Emergency Use Authorization (EUA). This EUA will remain  in effect (meaning this test can be used) for the duration of the COVID-19 declaration under Se ction 564(b)(1) of the Act, 21 U.S.C. section 360bbb-3(b)(1), unless the authorization is terminated or revoked sooner.  Performed at [facility], [place] [address], [place], [place] [address]      Labs: BNP (last 3 results) No results for input(s): BNP in the last 8760 hours. Basic Metabolic Panel: Recent Labs  Lab 06/27/20 1512 06/28/20 0638  NA 141 141  K 4.0 4.0  CL 107 103  CO2 23 27  GLUCOSE 147* 304*  BUN 42* 44*  CREATININE 1.56*  1.55* 1.60*  CALCIUM 8.8* 9.3   Liver Function Tests: No results for input(s): AST, ALT, ALKPHOS, BILITOT, PROT, ALBUMIN in the last 168 hours. No results for input(s): LIPASE, AMYLASE in the last 168 hours. No results for input(s): AMMONIA in the last 168 hours. CBC: Recent Labs  Lab 06/27/20 1512  WBC 5.2  NEUTROABS 4.3  HGB 10.1*  HCT 29.4*  MCV 90.5  PLT 75*   Cardiac Enzymes: No results for input(s): CKTOTAL, CKMB, CKMBINDEX, TROPONINI in the last 168 hours. BNP: Invalid input(s): POCBNP CBG: Recent Labs  Lab 06/28/20 2055 06/28/20 2321 06/29/20 0409 06/29/20 0745 06/29/20 1248  GLUCAP 215* 145* 104* 190* 179*   D-Dimer No results for input(s): DDIMER in the last 72 hours. Hgb A1c Recent Labs    06/27/20 1512  HGBA1C 10.6*   Lipid Profile No results for input(s): CHOL, HDL, LDLCALC, TRIG, CHOLHDL, LDLDIRECT in the last 72 hours. Thyroid function studies No results for input(s): TSH, T4TOTAL, T3FREE, THYROIDAB in the last 72 hours.  Invalid input(s): FREET3 Anemia work up No results for input(s): VITAMINB12, FOLATE, FERRITIN, TIBC, IRON, RETICCTPCT in the last 72 hours. Urinalysis    Component Value Date/Time   COLORURINE YELLOW (A) 02/16/2020 1923   APPEARANCEUR CLEAR (A) 02/16/2020 1923   LABSPEC 1.018 02/16/2020 1923    PHURINE 5.0 02/16/2020 1923   GLUCOSEU >=500 (A) 02/16/2020 1923   HGBUR NEGATIVE 02/16/2020 [place] NEGATIVE 02/16/2020 [place] NEGATIVE 02/16/2020 [place] NEGATIVE 02/16/2020 1923   NITRITE NEGATIVE 02/16/2020 1923   LEUKOCYTESUR NEGATIVE 02/16/2020 1923   Sepsis Labs Invalid input(s): PROCALCITONIN,  WBC,  LACTICIDVEN Microbiology Recent Results (from the past  240 hour(s))  SARS CORONAVIRUS 2 (TAT 6-24 HRS) Nasopharyngeal Nasopharyngeal Swab     Status: None   Collection Time: 06/27/20  4:47 PM   Specimen: Nasopharyngeal Swab  Result Value Ref Range Status   SARS Coronavirus 2 NEGATIVE NEGATIVE Final    Comment: (NOTE) SARS-CoV-2 target nucleic acids are NOT DETECTED.  The SARS-CoV-2 RNA is generally detectable in upper and lower respiratory specimens during the acute phase of infection. Negative results do not preclude SARS-CoV-2 infection, do not rule out co-infections with other pathogens, and should not be used as the sole basis for treatment or other patient management decisions. Negative results must be combined with clinical observations, patient history, and epidemiological information. The expected result is Negative.  Fact Sheet for Patients: SugarRoll.be  Fact Sheet for Healthcare Providers: https://www.woods-mathews.com/  This test is not yet approved or cleared by the [place] FDA and  has been authorized for detection and/or diagnosis of SARS-CoV-2 by FDA under an Emergency Use Authorization (EUA). This EUA will remain  in effect (meaning this test can be used) for the duration of the COVID-19 declaration under Se ction 564(b)(1) of the Act, 21 U.S.C. section 360bbb-3(b)(1), unless the authorization is terminated or revoked sooner.  Performed at [facility], [place] [address] [place],  [address]      Time coordinating discharge: Over 30 minutes  SIGNED:   Nolberto Hanlon, MD  [facility] 06/29/2020, 7:09 PM Pager   If 7PM-7AM, please contact night-coverage [email or web address] Password [email or web address]

## 2020-06-29 NOTE — Progress Notes (Signed)
Was contacted by Jerel Shepherd that patient was refusing to leave, and she had spoken with patient. I introduced myself, explained to patient she had to leave, and could not stay. Patient stated I cant walk. Explained patient states she cannot walk. Explained physical therapy had worked and evaluated her ability to walk and she was able to walk. She states she never said she would not leave. I stated that is what I was told. Explained she had to stay. I asked patient when she would be ready and she stated when she got dressed. Spoke with patient's nurse.

## 2020-06-29 NOTE — Progress Notes (Signed)
Medical transport to private apt [place], belongings at discharge.

## 2020-06-29 NOTE — TOC Progression Note (Signed)
Transition of Care [facility]) - Progression Note    Patient Details  Name: Samone Guhl MRN: [id number] Date of Birth: 1961/09/15  Transition [facility]) CM/SW [place], RN Phone Number: 06/29/2020, 9:35 AM  Clinical Narrative:    Met with the patient to discuss DC plan and needs She lives alone and her sister helps that lives nearby, she uses public transportation She needs a Corporate investment banker and a 3 in 1, , I called Zack with [employer] to get brought to the room, she also needs PT and I [facility], [facility], notified the physician that the [facility] orders need to be placed   Expected Discharge Plan: [place] Barriers to Discharge: Continued Medical Work up  Expected Discharge Plan and Services Expected Discharge Plan: [place] arrangements for the past 2 months: Single Family Home                 DME Arranged: Walker rolling with seat, 3-N-1 DME Agency: [employer] Date DME Agency Contacted: 06/29/20 Time DME Agency Contacted: [phone] Representative spoke with at DME Agency: Zack [place]: PT [place]: [facility] [employer] Date [place]: 06/29/20 Time [place]: [phone] Representative spoke with at [place]: [place] ([place]) Interventions    Readmission Risk Interventions Readmission Risk Prevention Plan 12/28/2019  Transportation Screening Complete  HRI or [place] Complete  Palliative Care Screening Not Applicable  Medication Review (RN Care Manager) Referral to Pharmacy  Some recent data might be hidden

## 2020-06-29 NOTE — TOC Progression Note (Signed)
Transition of Care [facility]) - Progression Note    Patient Details  Name: Susan Navarro MRN: [id number] Date of Birth: 09-03-1961  Transition of Care [facility]) CM/SW Contact  Su Hilt, RN Phone Number: 06/29/2020, 9:49 AM  Clinical Narrative:       Expected Discharge Plan: [place] Barriers to Discharge: Continued Medical Work up  Expected Discharge Plan and Services Expected Discharge Plan: [place] arrangements for the past 2 months: Single Family Home                 DME Arranged: Walker rolling with seat, 3-N-1 DME Agency: [employer] Date DME Agency Contacted: 06/29/20 Time DME Agency Contacted: [phone] Representative spoke with at DME Agency: [place]: PT [place]: [facility] Date [place]: 06/29/20 Time [place]: [phone] Representative spoke with at [place]: [place] ([place]) Interventions    Readmission Risk Interventions Readmission Risk Prevention Plan 12/28/2019  Transportation Screening Complete  HRI or [place] Complete  Palliative Care Screening Not Applicable  Medication Review (RN Care Manager) Referral to Pharmacy  Some recent data might be hidden

## 2020-06-29 NOTE — TOC Progression Note (Signed)
Transition of Care [facility]) - Progression Note    Patient Details  Name: Susan Navarro MRN: [id number] Date of Birth: December 28, 1960  Transition of Care [facility]) CM/SW Contact  Su Hilt, RN Phone Number: 06/29/2020, 3:57 PM  Clinical Narrative:   Charge nurse said the patient is willing to go EMS if it is free, I explained that with her insurance it may cover it but she might get a bill, Was asked to call EMS, I called [facility] and they will pick up at 530 PM to transport home    Expected Discharge Plan: [place] Barriers to Discharge: Continued Medical Work up  Expected Discharge Plan and Services Expected Discharge Plan: [place] arrangements for the past 2 months: Single Family Home Expected Discharge Date: 06/29/20               DME Arranged: Gilford Rile rolling with seat, 3-N-1 DME Agency: [employer] Date DME Agency Contacted: 06/29/20 Time DME Agency Contacted: [phone] Representative spoke with at DME Agency: [place]: PT [place]: [facility] Date [place]: 06/29/20 Time [place]: [phone] Representative spoke with at [place]: [place] ([place]) Interventions    Readmission Risk Interventions Readmission Risk Prevention Plan 12/28/2019  Transportation Screening Complete  HRI or [place] Screening Not Applicable  Medication Review (RN Care Manager) Referral to Pharmacy  Some recent data might be hidden

## 2020-07-24 ENCOUNTER — Inpatient Hospital Stay: Admit: 2020-07-24 | Payer: Medicare HMO

## 2020-07-24 ENCOUNTER — Emergency Department: Payer: Medicare HMO

## 2020-07-24 ENCOUNTER — Inpatient Hospital Stay (HOSPITAL_COMMUNITY)
Admit: 2020-07-24 | Discharge: 2020-07-24 | Disposition: A | Discharge status: Home / Self Care | Payer: Medicare HMO | Attending: Neurology | Admitting: Neurology

## 2020-07-24 ENCOUNTER — Inpatient Hospital Stay: Payer: Medicare HMO

## 2020-07-24 ENCOUNTER — Inpatient Hospital Stay
Admission: EM | Admit: 2020-07-24 | Discharge: 2020-07-29 | DRG: 064 | Disposition: A | Discharge status: Home Health | Payer: Medicare HMO | Attending: Internal Medicine | Admitting: Internal Medicine

## 2020-07-24 ENCOUNTER — Other Ambulatory Visit: Payer: Self-pay

## 2020-07-24 DIAGNOSIS — R4182 Altered mental status, unspecified: Principal | ICD-10-CM

## 2020-07-24 DIAGNOSIS — Z79899 Other long term (current) drug therapy: Secondary | ICD-10-CM

## 2020-07-24 DIAGNOSIS — Z794 Long term (current) use of insulin: Secondary | ICD-10-CM

## 2020-07-24 DIAGNOSIS — G9389 Other specified disorders of brain: Secondary | ICD-10-CM | POA: Diagnosis present

## 2020-07-24 DIAGNOSIS — Z20822 Contact with and (suspected) exposure to covid-19: Secondary | ICD-10-CM | POA: Diagnosis present

## 2020-07-24 DIAGNOSIS — G936 Cerebral edema: Secondary | ICD-10-CM | POA: Diagnosis present

## 2020-07-24 DIAGNOSIS — E1065 Type 1 diabetes mellitus with hyperglycemia: Secondary | ICD-10-CM | POA: Diagnosis present

## 2020-07-24 DIAGNOSIS — J323 Chronic sphenoidal sinusitis: Secondary | ICD-10-CM | POA: Diagnosis present

## 2020-07-24 DIAGNOSIS — I361 Nonrheumatic tricuspid (valve) insufficiency: Secondary | ICD-10-CM

## 2020-07-24 DIAGNOSIS — I10 Essential (primary) hypertension: Secondary | ICD-10-CM | POA: Diagnosis not present

## 2020-07-24 DIAGNOSIS — R161 Splenomegaly, not elsewhere classified: Secondary | ICD-10-CM

## 2020-07-24 DIAGNOSIS — E1022 Type 1 diabetes mellitus with diabetic chronic kidney disease: Secondary | ICD-10-CM | POA: Diagnosis present

## 2020-07-24 DIAGNOSIS — R29704 NIHSS score 4: Secondary | ICD-10-CM | POA: Diagnosis not present

## 2020-07-24 DIAGNOSIS — D732 Chronic congestive splenomegaly: Secondary | ICD-10-CM | POA: Diagnosis present

## 2020-07-24 DIAGNOSIS — Z833 Family history of diabetes mellitus: Secondary | ICD-10-CM

## 2020-07-24 DIAGNOSIS — R569 Unspecified convulsions: Secondary | ICD-10-CM

## 2020-07-24 DIAGNOSIS — Z7982 Long term (current) use of aspirin: Secondary | ICD-10-CM

## 2020-07-24 DIAGNOSIS — I639 Cerebral infarction, unspecified: Secondary | ICD-10-CM | POA: Diagnosis present

## 2020-07-24 DIAGNOSIS — I619 Nontraumatic intracerebral hemorrhage, unspecified: Secondary | ICD-10-CM | POA: Diagnosis present

## 2020-07-24 DIAGNOSIS — K7581 Nonalcoholic steatohepatitis (NASH): Secondary | ICD-10-CM | POA: Diagnosis present

## 2020-07-24 DIAGNOSIS — S32592A Other specified fracture of left pubis, initial encounter for closed fracture: Secondary | ICD-10-CM | POA: Diagnosis present

## 2020-07-24 DIAGNOSIS — I129 Hypertensive chronic kidney disease with stage 1 through stage 4 chronic kidney disease, or unspecified chronic kidney disease: Secondary | ICD-10-CM | POA: Diagnosis present

## 2020-07-24 DIAGNOSIS — D61818 Other pancytopenia: Secondary | ICD-10-CM | POA: Diagnosis present

## 2020-07-24 DIAGNOSIS — D731 Hypersplenism: Secondary | ICD-10-CM | POA: Diagnosis present

## 2020-07-24 DIAGNOSIS — D696 Thrombocytopenia, unspecified: Secondary | ICD-10-CM | POA: Diagnosis not present

## 2020-07-24 DIAGNOSIS — I340 Nonrheumatic mitral (valve) insufficiency: Secondary | ICD-10-CM | POA: Diagnosis not present

## 2020-07-24 DIAGNOSIS — I610 Nontraumatic intracerebral hemorrhage in hemisphere, subcortical: Secondary | ICD-10-CM

## 2020-07-24 DIAGNOSIS — R4701 Aphasia: Secondary | ICD-10-CM | POA: Diagnosis present

## 2020-07-24 DIAGNOSIS — Z8673 Personal history of transient ischemic attack (TIA), and cerebral infarction without residual deficits: Secondary | ICD-10-CM | POA: Diagnosis not present

## 2020-07-24 DIAGNOSIS — IMO0002 Reserved for concepts with insufficient information to code with codable children: Secondary | ICD-10-CM

## 2020-07-24 DIAGNOSIS — N179 Acute kidney failure, unspecified: Secondary | ICD-10-CM | POA: Diagnosis present

## 2020-07-24 DIAGNOSIS — N184 Chronic kidney disease, stage 4 (severe): Secondary | ICD-10-CM | POA: Diagnosis present

## 2020-07-24 DIAGNOSIS — K7469 Other cirrhosis of liver: Secondary | ICD-10-CM | POA: Diagnosis present

## 2020-07-24 DIAGNOSIS — K766 Portal hypertension: Secondary | ICD-10-CM | POA: Diagnosis present

## 2020-07-24 DIAGNOSIS — R739 Hyperglycemia, unspecified: Secondary | ICD-10-CM

## 2020-07-24 DIAGNOSIS — E1029 Type 1 diabetes mellitus with other diabetic kidney complication: Secondary | ICD-10-CM | POA: Diagnosis not present

## 2020-07-24 DIAGNOSIS — W19XXXA Unspecified fall, initial encounter: Secondary | ICD-10-CM | POA: Diagnosis present

## 2020-07-24 DIAGNOSIS — D631 Anemia in chronic kidney disease: Secondary | ICD-10-CM | POA: Diagnosis present

## 2020-07-24 DIAGNOSIS — I609 Nontraumatic subarachnoid hemorrhage, unspecified: Secondary | ICD-10-CM

## 2020-07-24 LAB — OSMOLALITY: Osmolality: 320 mOsm/kg — ABNORMAL HIGH (ref 275–295)

## 2020-07-24 LAB — COMPREHENSIVE METABOLIC PANEL
ALT: 71 U/L — ABNORMAL HIGH (ref 0–44)
AST: 18 U/L (ref 15–41)
Albumin: 4.1 g/dL (ref 3.5–5.0)
Alkaline Phosphatase: 608 U/L — ABNORMAL HIGH (ref 38–126)
Anion gap: 13 (ref 5–15)
BUN: 68 mg/dL — ABNORMAL HIGH (ref 6–20)
CO2: 27 mmol/L (ref 22–32)
Calcium: 9.9 mg/dL (ref 8.9–10.3)
Chloride: 91 mmol/L — ABNORMAL LOW (ref 98–111)
Creatinine, Ser: 2.3 mg/dL — ABNORMAL HIGH (ref 0.44–1.00)
GFR calc Af Amer: 26 mL/min — ABNORMAL LOW (ref >60)
GFR calc non Af Amer: 22 mL/min — ABNORMAL LOW (ref >60)
Glucose, Bld: 549 mg/dL (ref 70–99)
Potassium: 4.9 mmol/L (ref 3.5–5.1)
Sodium: 131 mmol/L — ABNORMAL LOW (ref 135–145)
Total Bilirubin: 0.8 mg/dL (ref 0.3–1.2)
Total Protein: 7.8 g/dL (ref 6.5–8.1)

## 2020-07-24 LAB — CBC WITH DIFFERENTIAL/PLATELET
Abs Immature Granulocytes: 0 10*3/uL (ref 0.00–0.07)
Basophils Absolute: 0 10*3/uL (ref 0.0–0.1)
Basophils Relative: 1 %
Eosinophils Absolute: 0.1 10*3/uL (ref 0.0–0.5)
Eosinophils Relative: 3 %
HCT: 32.6 % — ABNORMAL LOW (ref 36.0–46.0)
Hemoglobin: 10.8 g/dL — ABNORMAL LOW (ref 12.0–15.0)
Immature Granulocytes: 0 %
Lymphocytes Relative: 27 %
Lymphs Abs: 1.2 10*3/uL (ref 0.7–4.0)
MCH: 30.7 pg (ref 26.0–34.0)
MCHC: 33.1 g/dL (ref 30.0–36.0)
MCV: 92.6 fL (ref 80.0–100.0)
Monocytes Absolute: 0.4 10*3/uL (ref 0.1–1.0)
Monocytes Relative: 9 %
Neutro Abs: 2.6 10*3/uL (ref 1.7–7.7)
Neutrophils Relative %: 60 %
Platelets: 100 10*3/uL — ABNORMAL LOW (ref 150–400)
RBC: 3.52 MIL/uL — ABNORMAL LOW (ref 3.87–5.11)
RDW: 13 % (ref 11.5–15.5)
WBC: 4.3 10*3/uL (ref 4.0–10.5)
nRBC: 0 % (ref 0.0–0.2)

## 2020-07-24 LAB — GLUCOSE, CAPILLARY
Glucose-Capillary: 145 mg/dL — ABNORMAL HIGH (ref 70–99)
Glucose-Capillary: 146 mg/dL — ABNORMAL HIGH (ref 70–99)
Glucose-Capillary: 146 mg/dL — ABNORMAL HIGH (ref 70–99)
Glucose-Capillary: 184 mg/dL — ABNORMAL HIGH (ref 70–99)
Glucose-Capillary: 202 mg/dL — ABNORMAL HIGH (ref 70–99)
Glucose-Capillary: 206 mg/dL — ABNORMAL HIGH (ref 70–99)
Glucose-Capillary: 283 mg/dL — ABNORMAL HIGH (ref 70–99)
Glucose-Capillary: 286 mg/dL — ABNORMAL HIGH (ref 70–99)
Glucose-Capillary: 369 mg/dL — ABNORMAL HIGH (ref 70–99)
Glucose-Capillary: 369 mg/dL — ABNORMAL HIGH (ref 70–99)
Glucose-Capillary: 474 mg/dL — ABNORMAL HIGH (ref 70–99)
Glucose-Capillary: 474 mg/dL — ABNORMAL HIGH (ref 70–99)

## 2020-07-24 LAB — URINALYSIS, COMPLETE (UACMP) WITH MICROSCOPIC
Bacteria, UA: NONE SEEN
Bilirubin Urine: NEGATIVE
Glucose, UA: >=500 mg/dL — AB
Hgb urine dipstick: NEGATIVE
Ketones, ur: 5 mg/dL — AB
Leukocytes,Ua: NEGATIVE
Nitrite: NEGATIVE
Protein, ur: NEGATIVE mg/dL
Specific Gravity, Urine: 1.013 (ref 1.005–1.030)
pH: 6 (ref 5.0–8.0)

## 2020-07-24 LAB — LIPID PANEL
Cholesterol: 55 mg/dL (ref 0–200)
HDL: 30 mg/dL — ABNORMAL LOW (ref >40)
LDL Cholesterol: 19 mg/dL (ref 0–99)
Total CHOL/HDL Ratio: 1.8 RATIO
Triglycerides: 28 mg/dL (ref <150)
VLDL: 6 mg/dL (ref 0–40)

## 2020-07-24 LAB — POTASSIUM: Potassium: 3.5 mmol/L (ref 3.5–5.1)

## 2020-07-24 LAB — BASIC METABOLIC PANEL
Anion gap: 10 (ref 5–15)
BUN: 48 mg/dL — ABNORMAL HIGH (ref 6–20)
CO2: 25 mmol/L (ref 22–32)
Calcium: 9 mg/dL (ref 8.9–10.3)
Chloride: 104 mmol/L (ref 98–111)
Creatinine, Ser: 1.56 mg/dL — ABNORMAL HIGH (ref 0.44–1.00)
GFR calc Af Amer: 41 mL/min — ABNORMAL LOW (ref >60)
GFR calc non Af Amer: 36 mL/min — ABNORMAL LOW (ref >60)
Glucose, Bld: 136 mg/dL — ABNORMAL HIGH (ref 70–99)
Potassium: 3.8 mmol/L (ref 3.5–5.1)
Sodium: 139 mmol/L (ref 135–145)

## 2020-07-24 LAB — SARS CORONAVIRUS 2 BY RT PCR (HOSPITAL ORDER, PERFORMED IN ~~LOC~~ HOSPITAL LAB): SARS Coronavirus 2: NEGATIVE

## 2020-07-24 LAB — PROTIME-INR
INR: 1.1 (ref 0.8–1.2)
Prothrombin Time: 13.6 seconds (ref 11.4–15.2)

## 2020-07-24 IMAGING — CT CT ANGIO NECK
2 of 7 series · 8 of 33 positions shown · IV contrast (APPLIED)
Comparison: None.

CLINICAL DATA: Encephalopathy with intracranial hemorrhage

EXAM:
CT ANGIOGRAPHY HEAD AND NECK
TECHNIQUE: Multidetector CT imaging of the head and neck was performed using
the standard protocol during bolus administration of intravenous
contrast. Multiplanar CT image reconstructions and MIPs were
obtained to evaluate the vascular anatomy. Carotid stenosis
measurements (when applicable) are obtained utilizing NASCET
criteria, using the distal internal carotid diameter as the
denominator.
CONTRAST:  60mL OMNIPAQUE IOHEXOL 350 MG/ML SOLN

[Series 8: cta head neck · axial · 0.61mm/px · z∈[-194,-80]mm · 2 of 173 slices shown]
[im 58/173  soft-tissue]
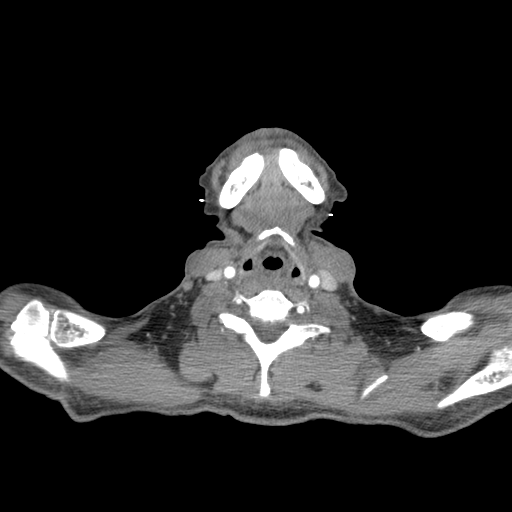
[im 115/173  soft-tissue]
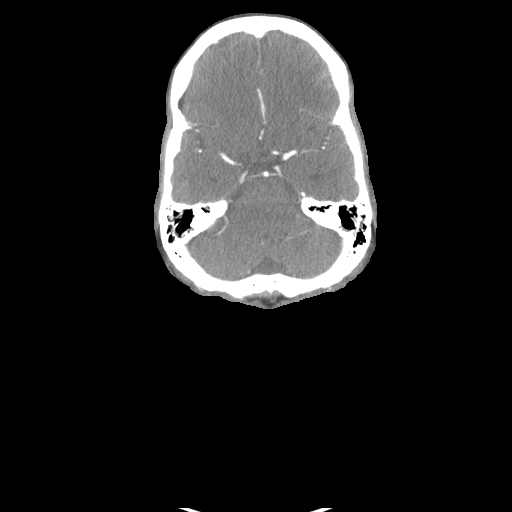

[Series 10: ax thin · axial · 0.33mm/px · z∈[-317,-60]mm · 6 of 377 slices shown]
[im 54/377  soft-tissue]
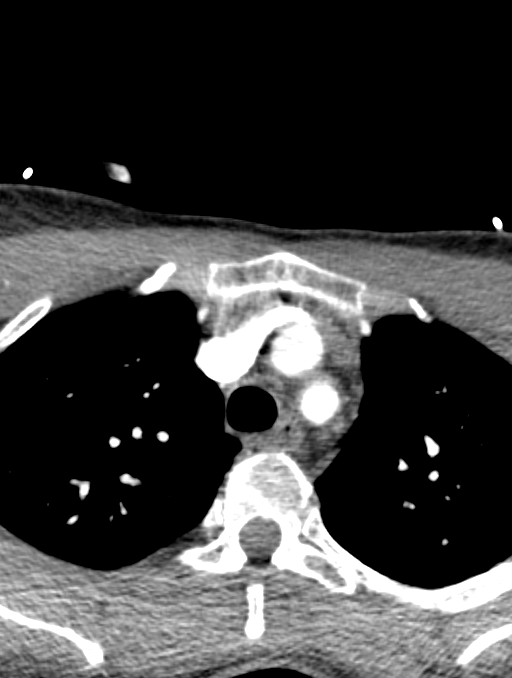
[im 108/377  bone]
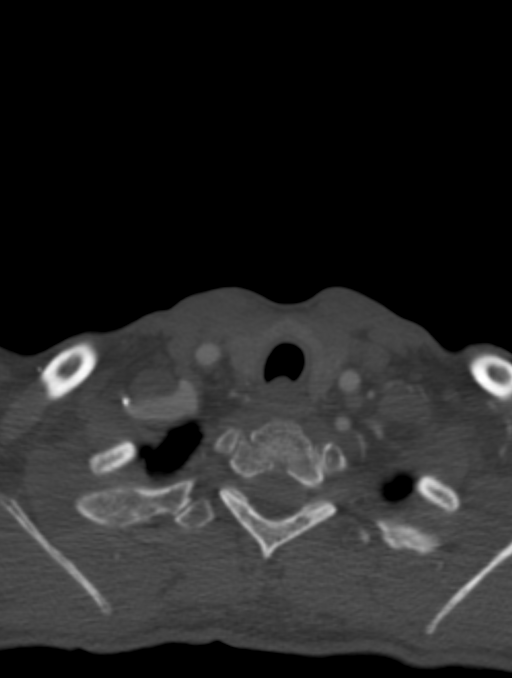
[im 162/377  soft-tissue]
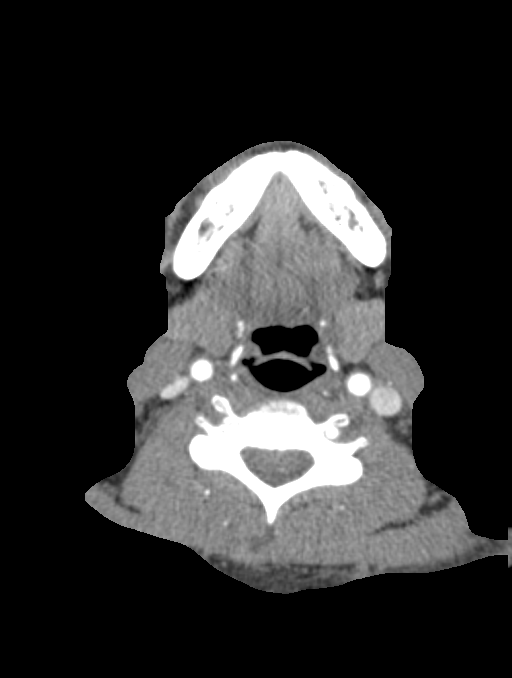
[im 215/377  bone]
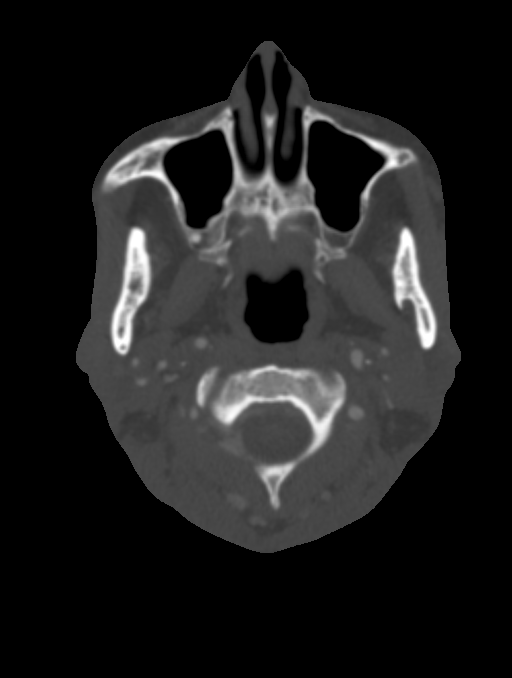
[im 269/377  soft-tissue]
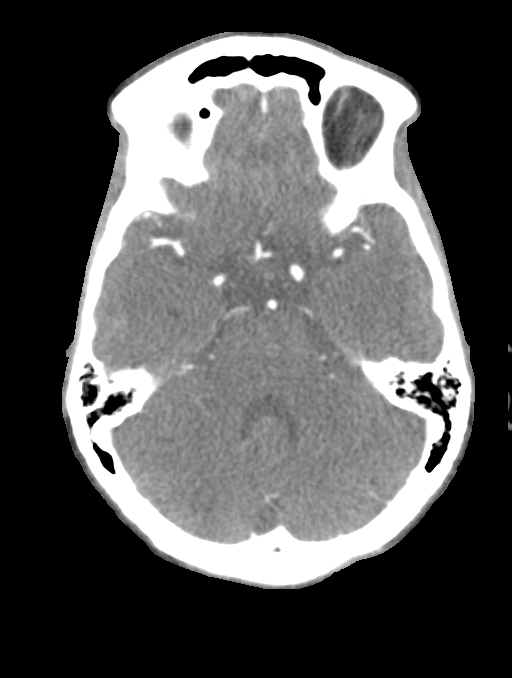
[im 323/377  bone]
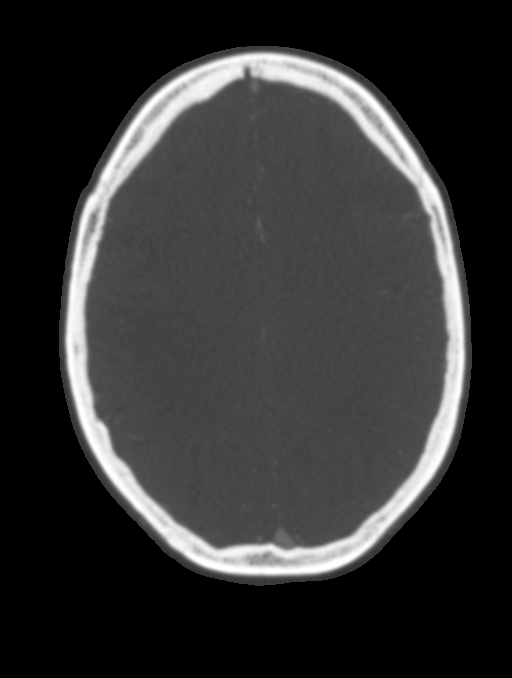

[8 of 33 positions shown; findings below may reference images not displayed]

FINDINGS: CTA NECK FINDINGS

SKELETON: There is no bony spinal canal stenosis. No lytic or
blastic lesion.

OTHER NECK: Normal pharynx, larynx and major salivary glands. No
cervical lymphadenopathy. Unremarkable thyroid gland.

UPPER CHEST: No pneumothorax or pleural effusion. No nodules or
masses.

AORTIC ARCH:

There is no calcific atherosclerosis of the aortic arch. There is no
aneurysm, dissection or hemodynamically significant stenosis of the
visualized portion of the aorta. Conventional 3 vessel aortic
branching pattern. The visualized proximal subclavian arteries are
widely patent.

RIGHT CAROTID SYSTEM: Normal without aneurysm, dissection or
stenosis.

LEFT CAROTID SYSTEM: Normal without aneurysm, dissection or
stenosis.

VERTEBRAL ARTERIES: Left dominant configuration. Both origins are
clearly patent. There is no dissection, occlusion or flow-limiting
stenosis to the skull base (V1-V3 segments).

CTA HEAD FINDINGS

POSTERIOR CIRCULATION:

--Vertebral arteries: Normal V4 segments.

--Inferior cerebellar arteries: Normal.

--Basilar artery: Normal.

--Superior cerebellar arteries: Normal.

--Posterior cerebral arteries (PCA): Normal.

ANTERIOR CIRCULATION:

--Intracranial internal carotid arteries: Normal.

--Anterior cerebral arteries (ACA): Normal. Both A1 segments are
present. Patent anterior communicating artery (a-comm).

--Middle cerebral arteries (MCA): Normal.

VENOUS SINUSES: As permitted by contrast timing, patent.

ANATOMIC VARIANTS: None

Review of the MIP images confirms the above findings.
IMPRESSION: 1. Normal CTA of the head and neck. No aneurysm, vascular
malformation or large vessel occlusion.
2. Unchanged focus of hemorrhage of the left frontal lobe, better
characterized on earlier noncontrast study.

## 2020-07-24 IMAGING — MR MR HEAD W/O CM
12 series · 42 of 48 positions shown · non-contrast
Comparison: Head CT performed earlier the same day [DATE] CT
angiogram head [DATE], brain MRI [DATE].

CLINICAL DATA: Neuro deficit, acute, stroke suspected. Additional
provided: 5-year-old female presenting with episode of confusion,
recent fall.

EXAM:
MRI HEAD WITHOUT CONTRAST
TECHNIQUE: Multiplanar, multiecho pulse sequences of the brain and surrounding
structures were obtained without intravenous contrast.

[Series 5: ax dwi_tracew · axial · 3.0mm · 0.60mm/px · z∈[-123,+27]mm · 4 of 48 slices shown]
[im 1/48]
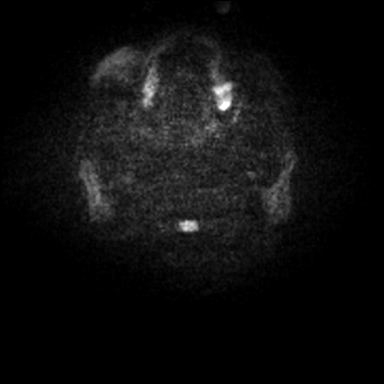
[im 16/48]
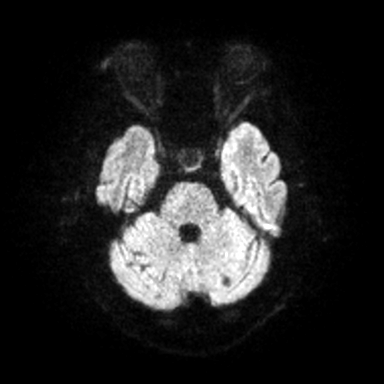
[im 32/48]
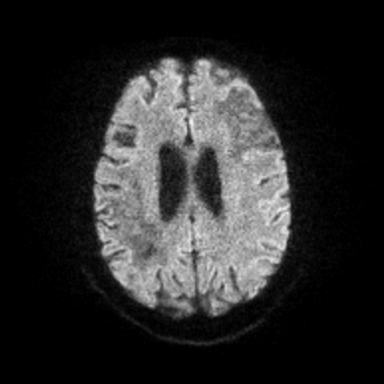
[im 48/48]
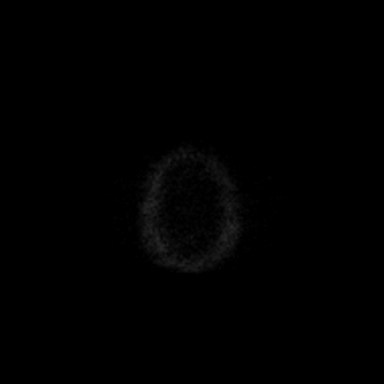

[Series 6: ax dwi_adc · axial · 3.0mm · 0.60mm/px · z∈[-123,+27]mm · 3 of 48 slices shown]
[im 1/48]
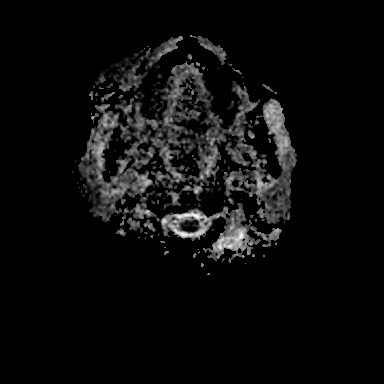
[im 24/48]
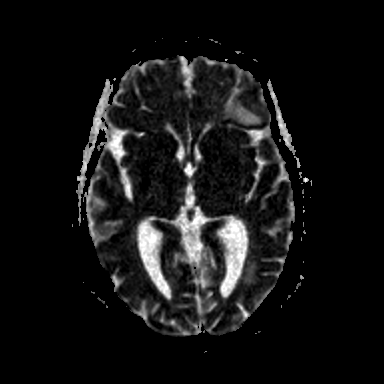
[im 48/48]
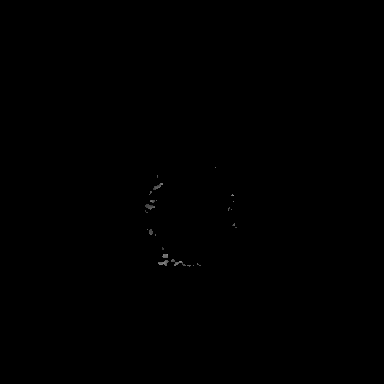

[Series 7: cor dwi_tracew · coronal · 5.0mm · 0.68mm/px · 3 of 38 slices shown]
[im 1/38]
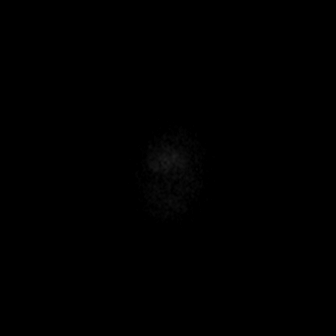
[im 19/38]
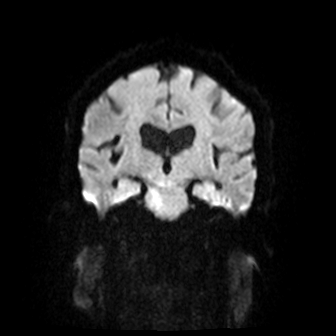
[im 38/38]
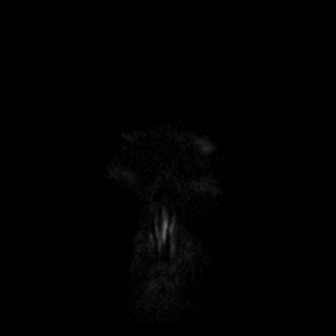

[Series 8: cor dwi_adc · coronal · 5.0mm · 0.68mm/px · 3 of 38 slices shown]
[im 1/38]
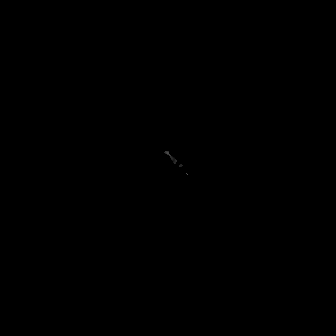
[im 19/38]
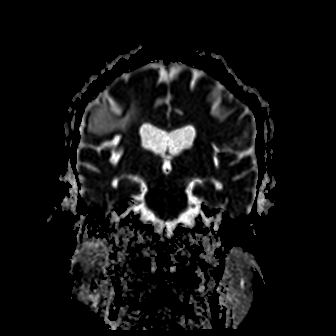
[im 38/38]
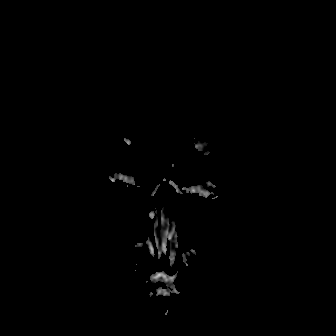

[Series 9: T1 · sagittal · 5.0mm · 0.62mm/px · 2 of 22 slices shown (1 of 2)]
[im 1/22]
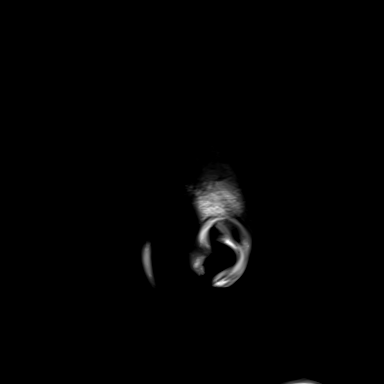
[im 22/22]
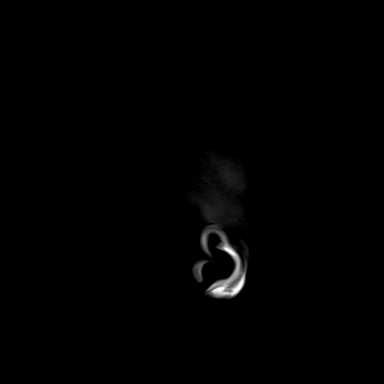

[Series 10: T2 · axial · 5.0mm · 0.53mm/px · z∈[-120,+26]mm · 2 of 26 slices shown (1 of 2)]
[im 1/26]
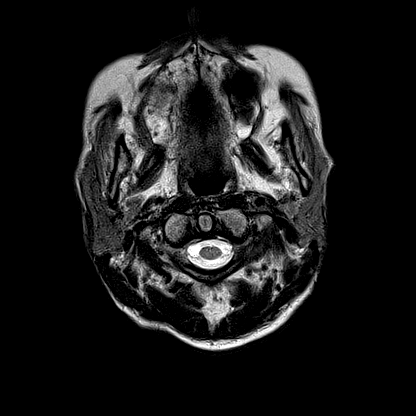
[im 26/26]
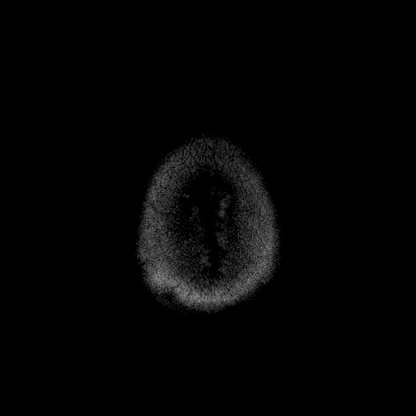

[Series 11: mag_images · axial · 3.0mm · 0.90mm/px · z∈[-131,+40]mm · 4 of 60 slices shown]
[im 1/60]
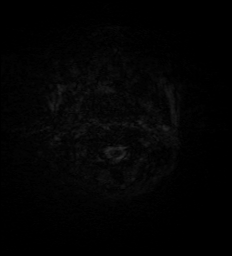
[im 20/60]
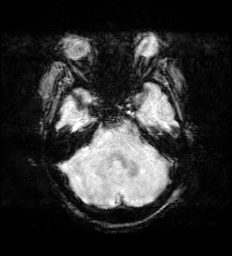
[im 40/60]
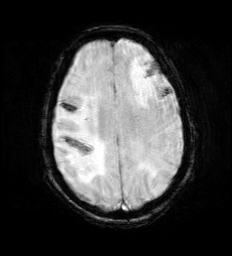
[im 60/60]
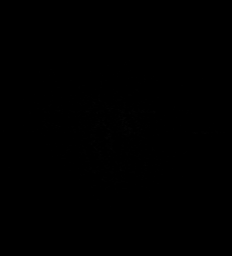

[Series 12: pha_images · axial · 3.0mm · 0.90mm/px · z∈[-131,+40]mm · 4 of 60 slices shown]
[im 1/60]
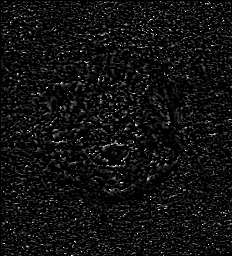
[im 20/60]
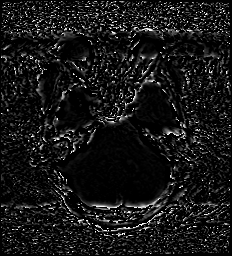
[im 40/60]
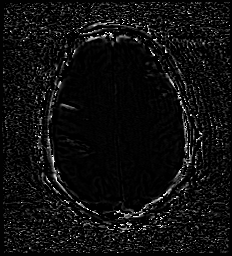
[im 60/60]
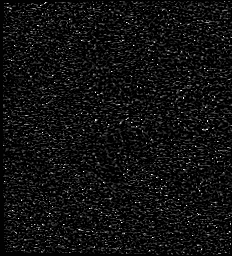

[Series 13: swi_images · axial · 3.0mm · 0.90mm/px · z∈[-131,-18]mm · 3 of 60 slices shown]
[im 1/60]
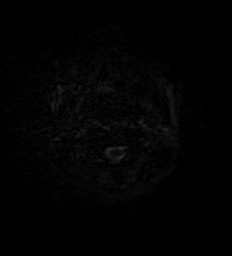
[im 20/60]
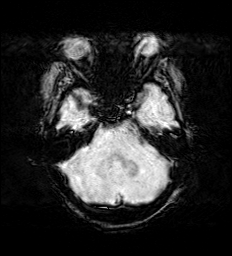
[im 40/60]
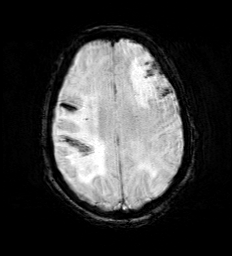

[Series 15: FLAIR · axial · 3.0mm · 0.53mm/px · z∈[-121,+36]mm · 4 of 55 slices shown]
[im 1/55]
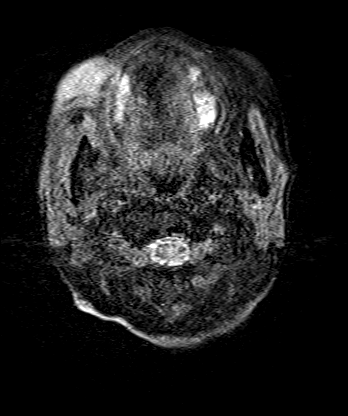
[im 19/55]
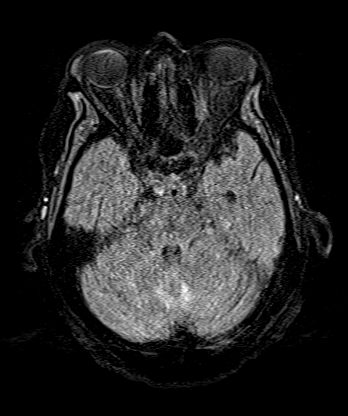
[im 37/55]
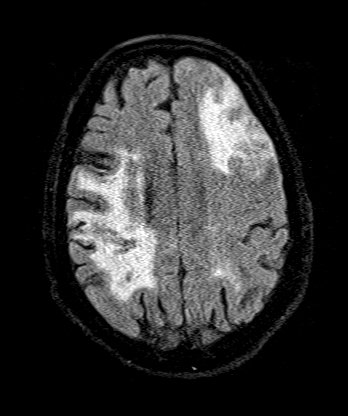
[im 55/55]
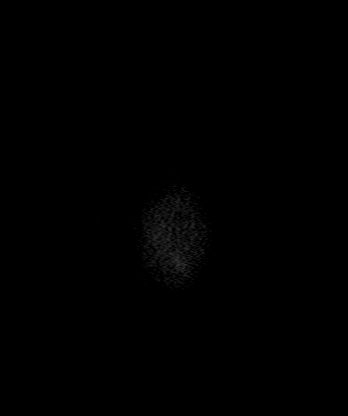

[Series 16: T1 · axial · 1.0mm · 0.98mm/px · z∈[-122,+46]mm · 8 of 175 slices shown (2 of 2)]
[im 1/175]
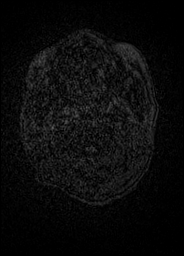
[im 30/175]
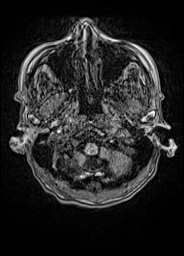
[im 59/175]
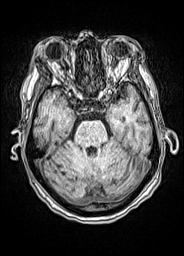
[im 73/175]
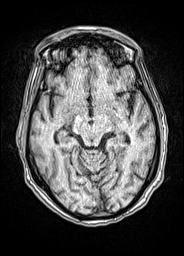
[im 102/175]
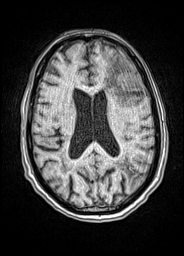
[im 117/175]
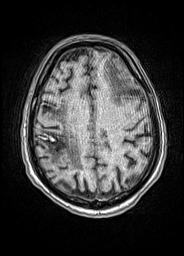
[im 146/175]
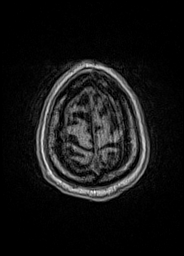
[im 175/175]
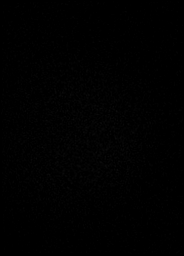

[Series 17: T2 · coronal · 5.0mm · 0.45mm/px · 2 of 29 slices shown (2 of 2)]
[im 1/29]
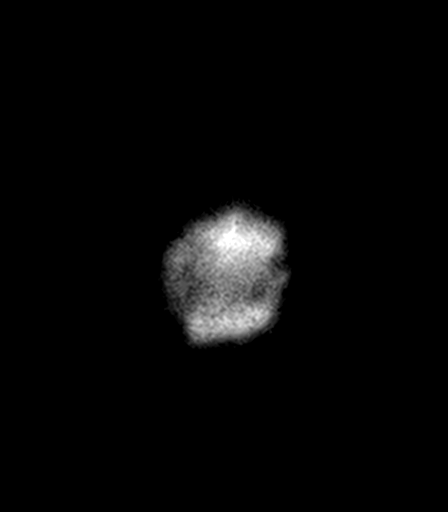
[im 29/29]
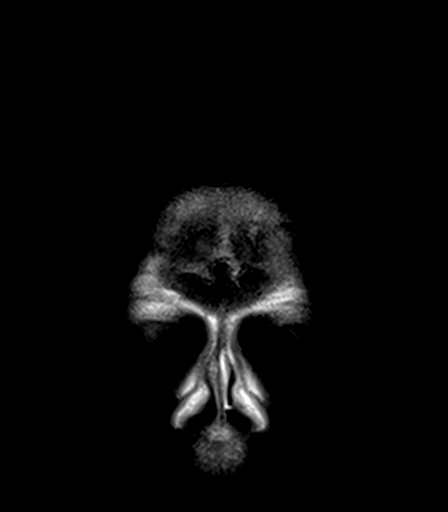

[42 of 48 positions shown; findings below may reference images not displayed]

FINDINGS: Brain:

Intermittently motion degraded examination. Most notably, there is
mild-to-moderate motion degradation of the axial T2/FLAIR sequence
and SWI sequence as well as axial T1 weighted sequence.

There is moderate predominantly vasogenic edema within the
anterolateral left frontal lobe. Within this region and abutting the
cortical surface, there is a rounded 1.7 cm T2 intermediate signal
(series 10, image 17). There is no definite restricted diffusion at
this site. Unchanged overlying small volume subarachnoid hemorrhage.

Stable background mild generalized parenchymal atrophy.

Redemonstrated remote cortically based infarcts within the posterior
right frontal lobe and right frontal operculum, posterior left
frontal lobe, bilateral parietal lobes and left occipital lobe.
Associated chronic blood products at site of the chronic infarcts
within the posterior right frontal lobe and right frontal operculum
as well as left parietal lobe.

Additional scattered supratentorial chronic microhemorrhages.

Chronic lacunar infarcts within the right lentiform nucleus and left
thalamus. Numerous small chronic infarcts within the cerebellar
hemispheres bilaterally. Background mild chronic small vessel
ischemic changes within the cerebral white matter and pons.

There is no acute infarct.

No midline shift or hydrocephalus.

Vascular: Expected proximal arterial flow voids.

Skull and upper cervical spine: No focal marrow lesion.

Sinuses/Orbits: Visualized orbits show no acute finding. Frothy
secretions and mild mucosal thickening within the left sphenoid
sinus. No significant mastoid effusion.
IMPRESSION: Motion degraded exam.

Moderate-sized region of predominantly vasogenic edema within the
anterolateral left frontal lobe. Within this region, and abutting
the cortical surface, there is a 1.7 cm circumscribed focus of T2
intermediate signal. There is no appreciable restricted diffusion at
this site and there is unchanged overlying small-volume subarachnoid
hemorrhage. Findings are suspicious for a possible underlying mass
and post-contrast MR imaging of the brain is recommended for further
evaluation. Alternatively, these findings may reflect a late
subacute infarct with hemorrhagic conversion or parenchymal hematoma
with surrounding edema.

Multifocal chronic cortically based infarcts within the bilateral
cerebral hemispheres as described. Chronic lacunar infarcts within
the right basal ganglia, left thalamus and bilateral cerebellar
hemispheres.

Background mild chronic small vessel ischemic disease within the
cerebral white matter and pons.

Mild generalized parenchymal atrophy.

Left sphenoid sinusitis.

## 2020-07-24 IMAGING — CT CT ANGIO HEAD
2 of 7 series · 8 of 33 positions shown · IV contrast (APPLIED)
Comparison: None.

CLINICAL DATA: Encephalopathy with intracranial hemorrhage

EXAM:
CT ANGIOGRAPHY HEAD AND NECK
TECHNIQUE: Multidetector CT imaging of the head and neck was performed using
the standard protocol during bolus administration of intravenous
contrast. Multiplanar CT image reconstructions and MIPs were
obtained to evaluate the vascular anatomy. Carotid stenosis
measurements (when applicable) are obtained utilizing NASCET
criteria, using the distal internal carotid diameter as the
denominator.
CONTRAST:  60mL OMNIPAQUE IOHEXOL 350 MG/ML SOLN

[Series 8: cta head neck · axial · 0.61mm/px · z∈[-194,-80]mm · 2 of 173 slices shown]
[im 58/173  soft-tissue]
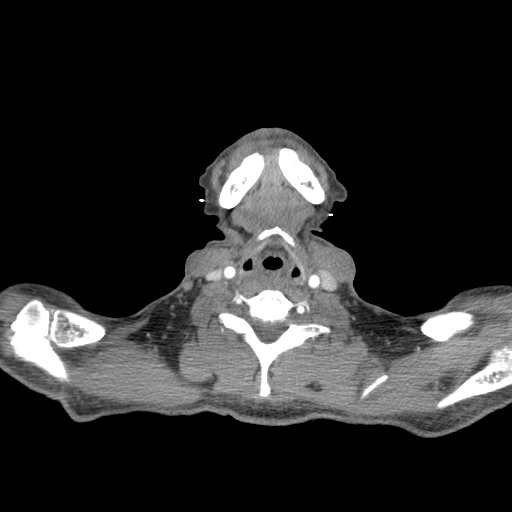
[im 115/173  soft-tissue]
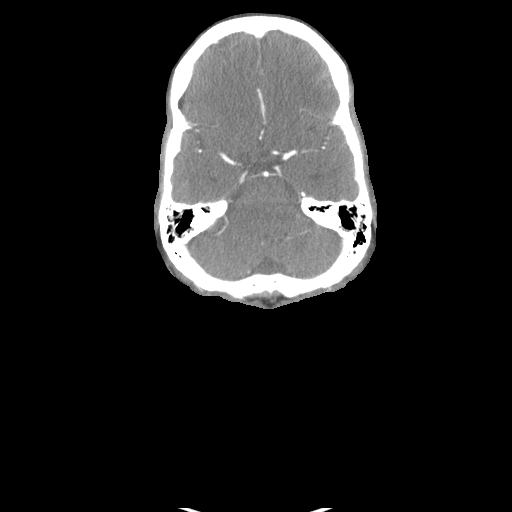

[Series 10: ax thin · axial · 0.33mm/px · z∈[-317,-60]mm · 6 of 377 slices shown]
[im 54/377  soft-tissue]
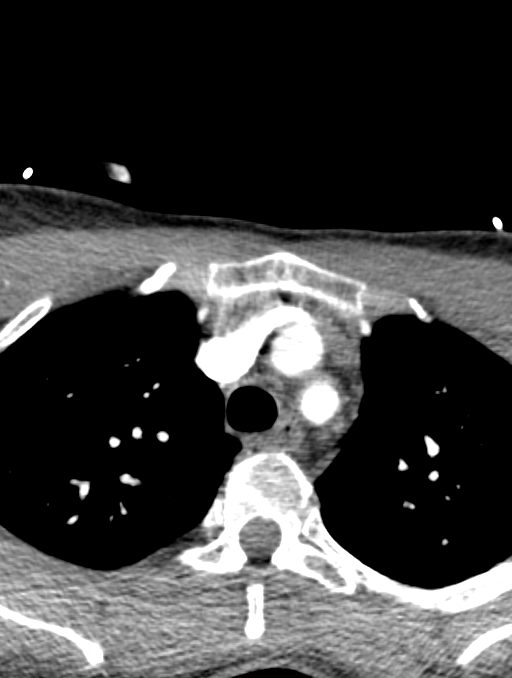
[im 108/377  bone]
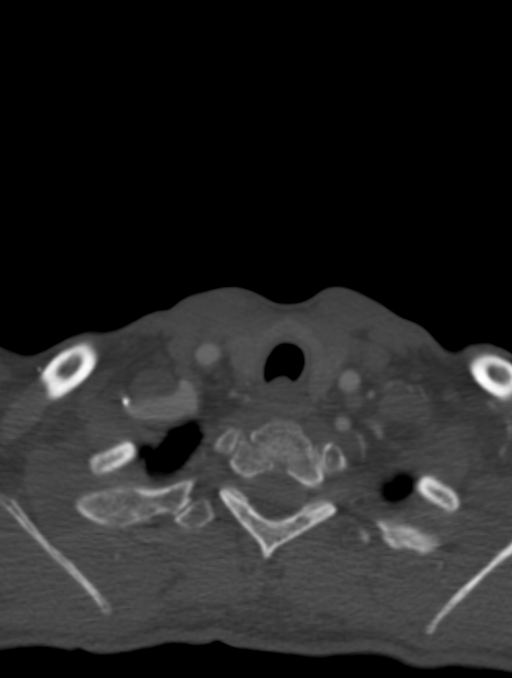
[im 162/377  soft-tissue]
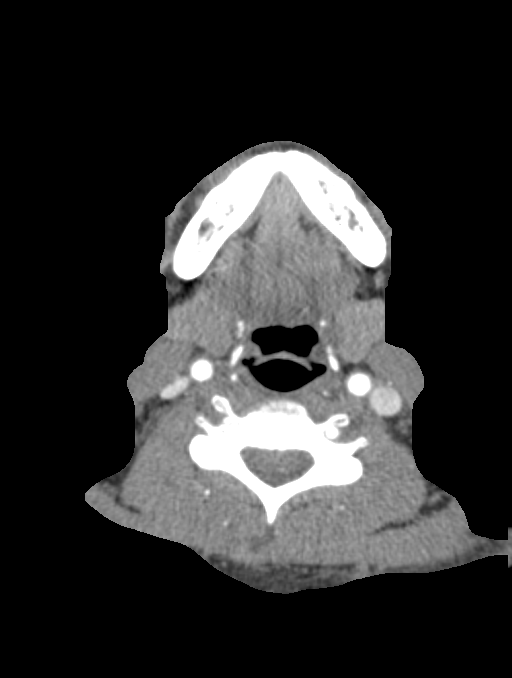
[im 215/377  bone]
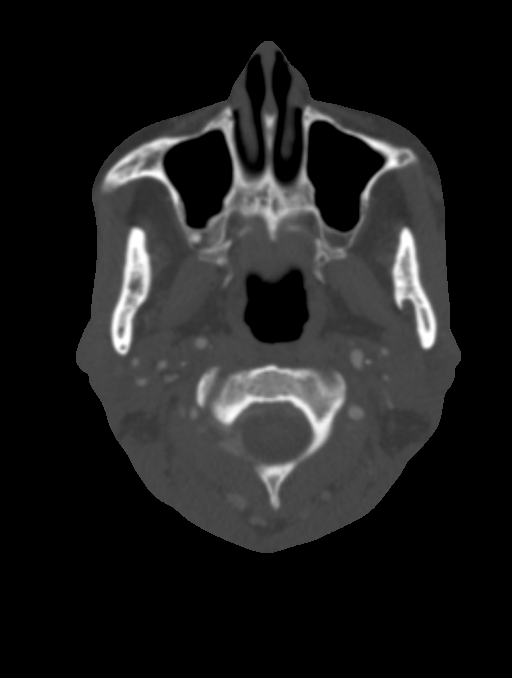
[im 269/377  soft-tissue]
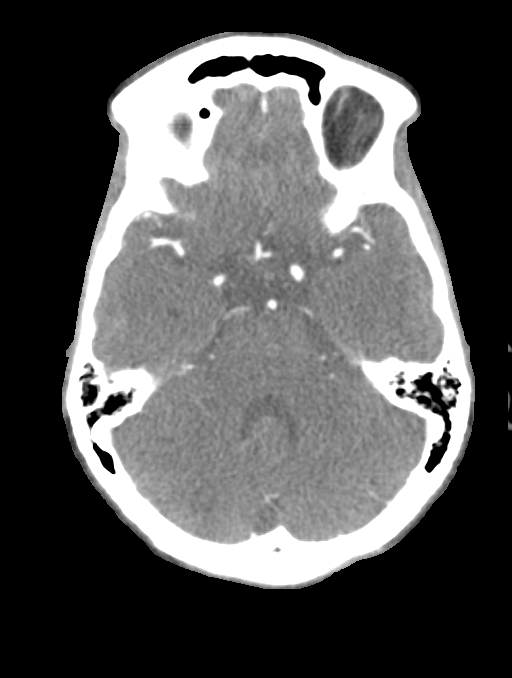
[im 323/377  bone]
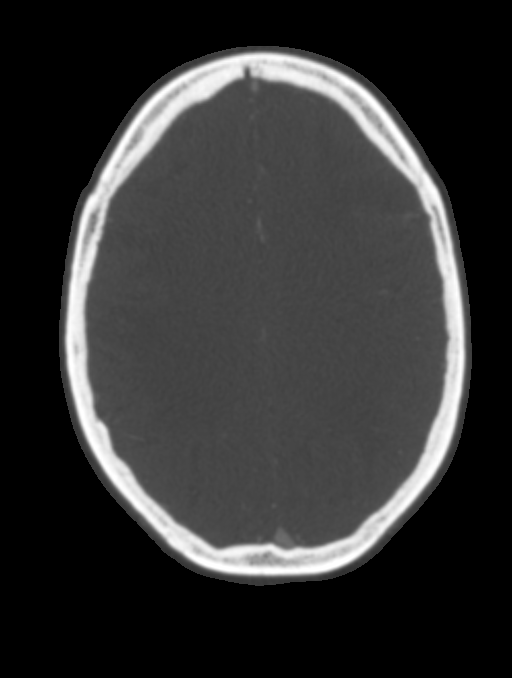

[8 of 33 positions shown; findings below may reference images not displayed]

FINDINGS: CTA NECK FINDINGS

SKELETON: There is no bony spinal canal stenosis. No lytic or
blastic lesion.

OTHER NECK: Normal pharynx, larynx and major salivary glands. No
cervical lymphadenopathy. Unremarkable thyroid gland.

UPPER CHEST: No pneumothorax or pleural effusion. No nodules or
masses.

AORTIC ARCH:

There is no calcific atherosclerosis of the aortic arch. There is no
aneurysm, dissection or hemodynamically significant stenosis of the
visualized portion of the aorta. Conventional 3 vessel aortic
branching pattern. The visualized proximal subclavian arteries are
widely patent.

RIGHT CAROTID SYSTEM: Normal without aneurysm, dissection or
stenosis.

LEFT CAROTID SYSTEM: Normal without aneurysm, dissection or
stenosis.

VERTEBRAL ARTERIES: Left dominant configuration. Both origins are
clearly patent. There is no dissection, occlusion or flow-limiting
stenosis to the skull base (V1-V3 segments).

CTA HEAD FINDINGS

POSTERIOR CIRCULATION:

--Vertebral arteries: Normal V4 segments.

--Inferior cerebellar arteries: Normal.

--Basilar artery: Normal.

--Superior cerebellar arteries: Normal.

--Posterior cerebral arteries (PCA): Normal.

ANTERIOR CIRCULATION:

--Intracranial internal carotid arteries: Normal.

--Anterior cerebral arteries (ACA): Normal. Both A1 segments are
present. Patent anterior communicating artery (a-comm).

--Middle cerebral arteries (MCA): Normal.

VENOUS SINUSES: As permitted by contrast timing, patent.

ANATOMIC VARIANTS: None

Review of the MIP images confirms the above findings.
IMPRESSION: 1. Normal CTA of the head and neck. No aneurysm, vascular
malformation or large vessel occlusion.
2. Unchanged focus of hemorrhage of the left frontal lobe, better
characterized on earlier noncontrast study.

## 2020-07-24 IMAGING — CT CT HEAD W/O CM
3 series · 16 of 47 positions shown, 19 images · non-contrast
Comparison: Earlier today

CLINICAL DATA: Follow-up intracranial hemorrhage

EXAM:
CT HEAD WITHOUT CONTRAST
TECHNIQUE: Contiguous axial images were obtained from the base of the skull
through the vertex without intravenous contrast.

[Series 2: head wo · axial · 0.47mm/px · z∈[-167,-37]mm · 10 of 32 slices shown, 13 images]
[im 3/32  brain]
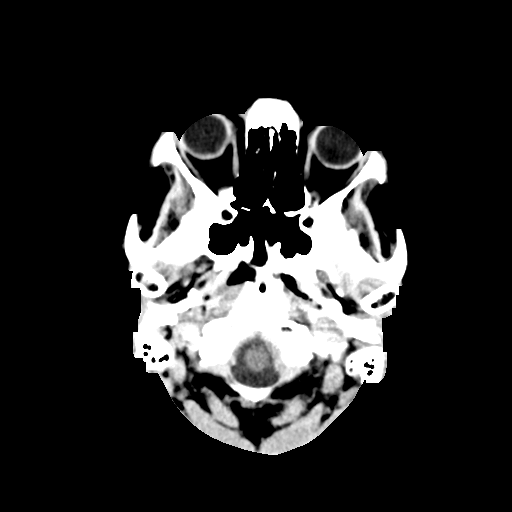
[im 3/32  bone]
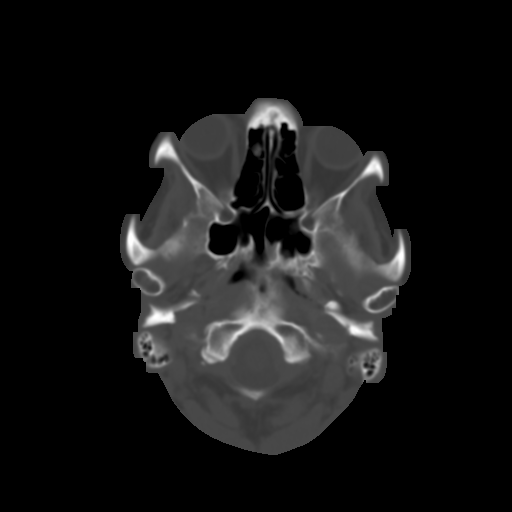
[im 6/32  brain]
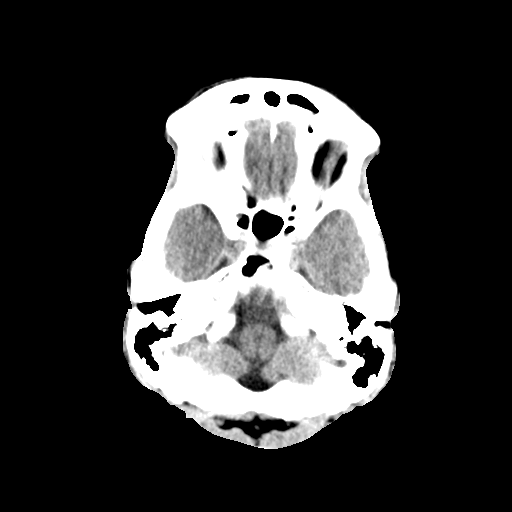
[im 9/32  brain]
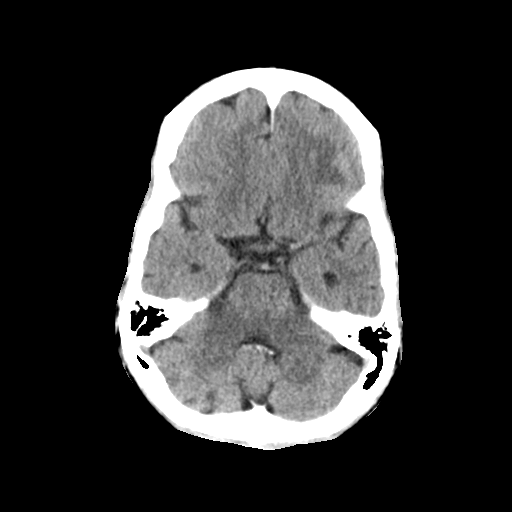
[im 11/32  brain]
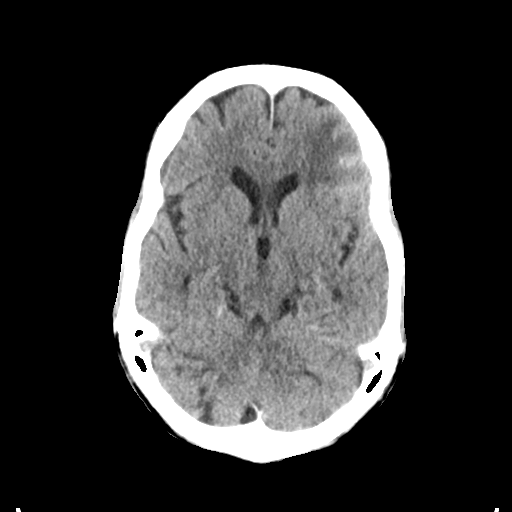
[im 14/32  brain]
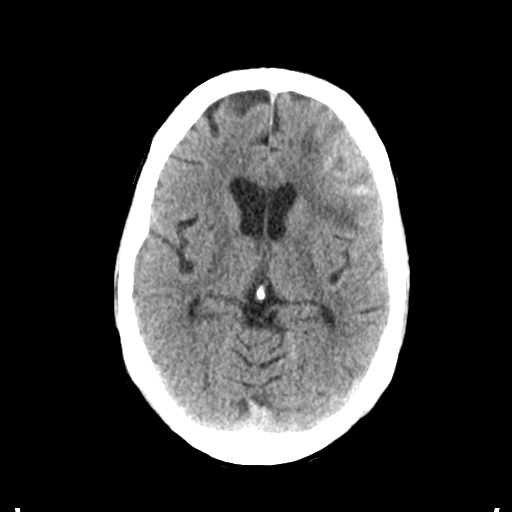
[im 14/32  bone]
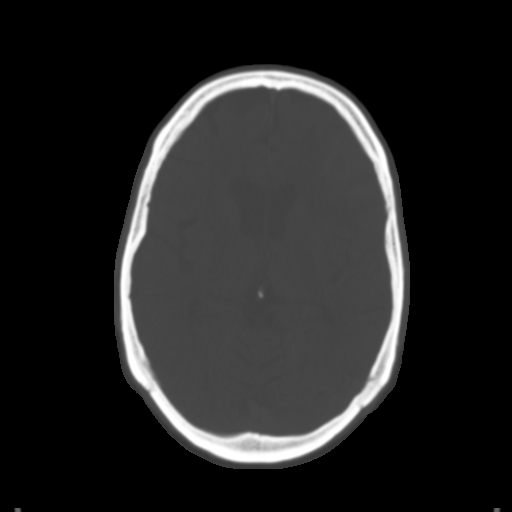
[im 18/32  brain]
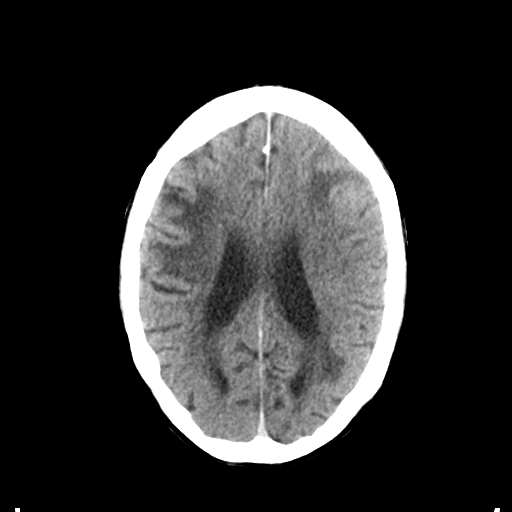
[im 21/32  brain]
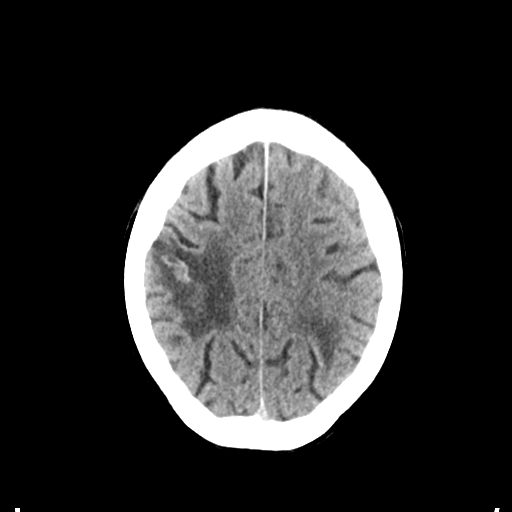
[im 24/32  brain]
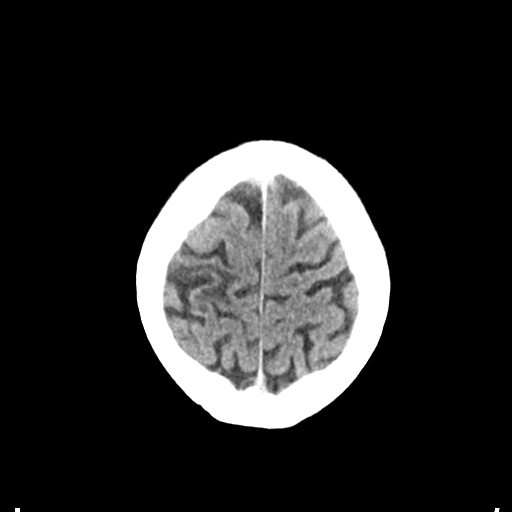
[im 26/32  brain]
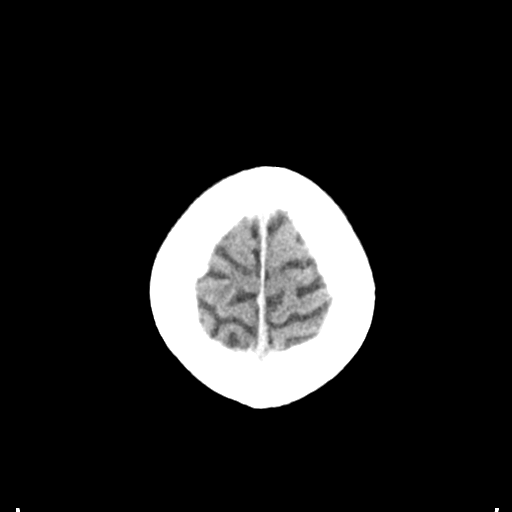
[im 26/32  bone]
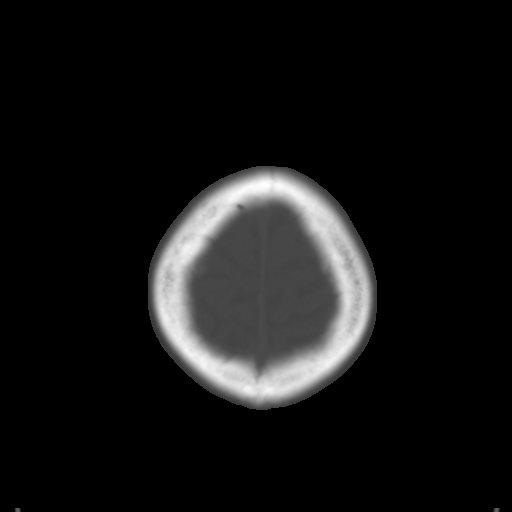
[im 29/32  brain]
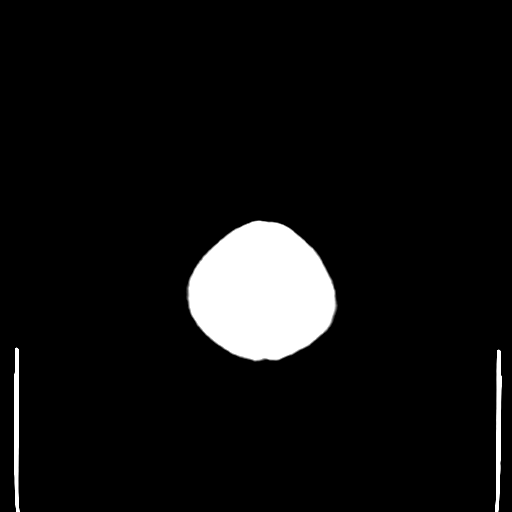

[Series 4: coronal soft tissue · coronal · 0.31mm/px · 3 of 66 slices shown]
[im 22/66  brain]
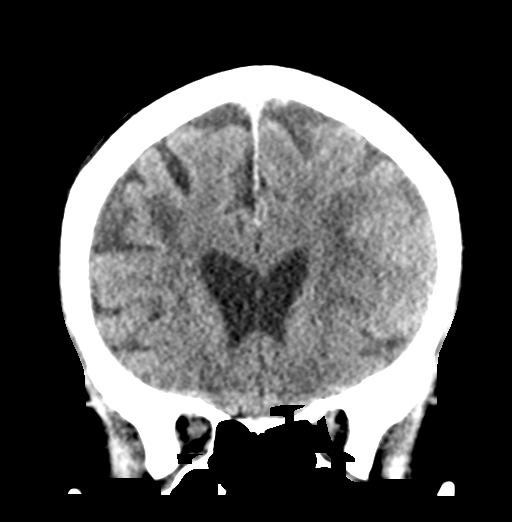
[im 29/66  brain]
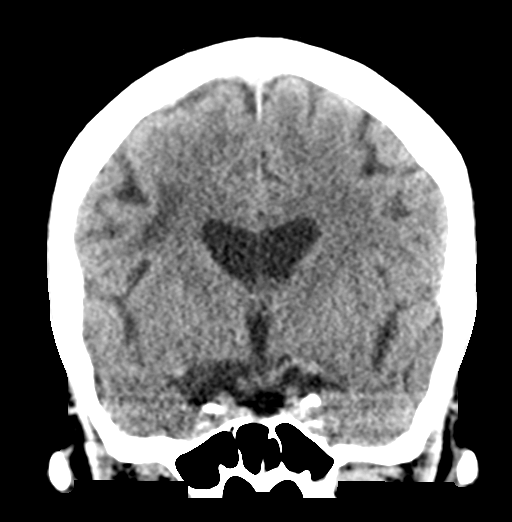
[im 37/66  brain]
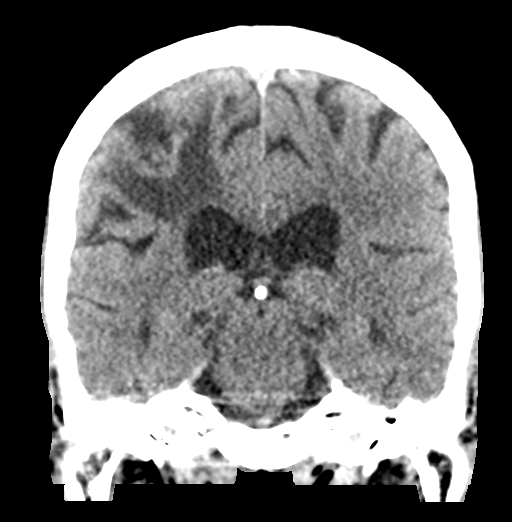

[Series 5: sagittal soft tissue · sagittal · 0.31mm/px · 3 of 53 slices shown]
[im 18/53  brain]
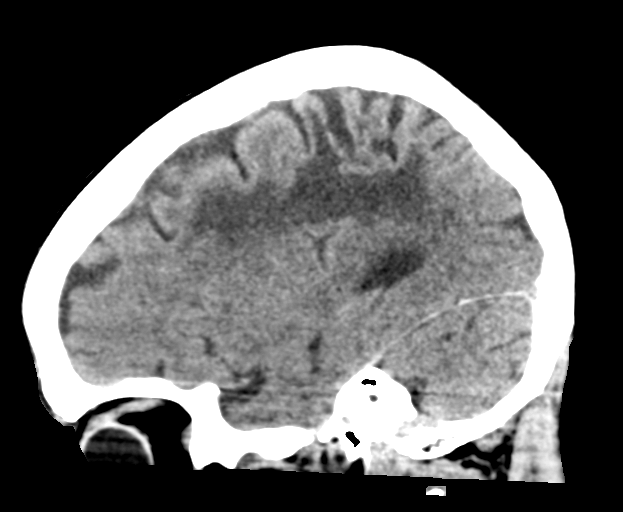
[im 27/53  brain]
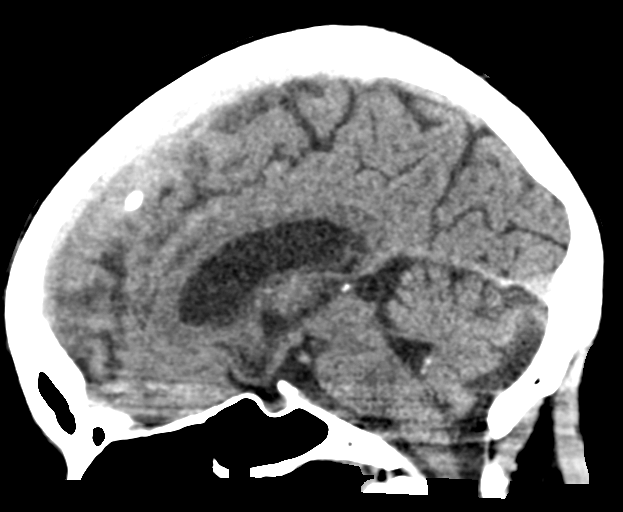
[im 35/53  brain]
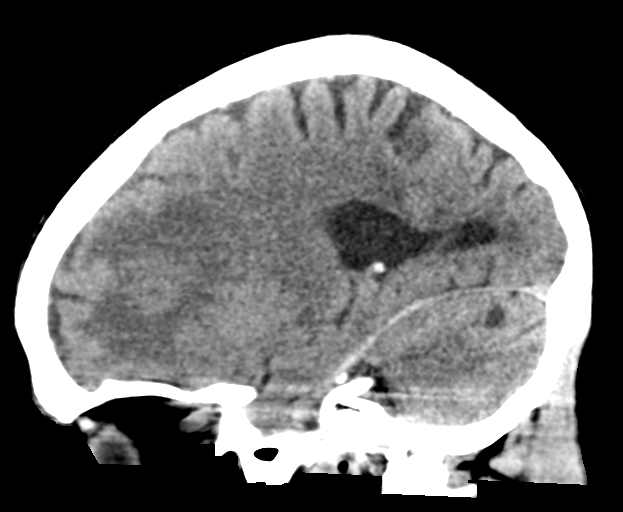

[16 of 47 positions shown; findings below may reference images not displayed]

FINDINGS: Brain: Edematous appearance and swelling with cortical/subarachnoid
hemorrhage in the anterior and lateral left frontal lobe. The degree
of hemorrhage appears increased without discrete hematoma. No
progressive swelling and no midline shift.

Remote high right frontal, left posterior frontal, and left
occipital cortically based infarcts. Small remote right more than
left cerebellar infarcts. No hydrocephalus.

Vascular: No hyperdense vessel.

Skull: Negative

Sinuses/Orbits: Bilateral cataract resection
IMPRESSION: 1. Progressive parenchymal/subarachnoid hemorrhage along the swollen
left frontal lobe, hemorrhagic infarct is again favored. Recommend
continued follow-up.
2. Remote cerebral and cerebellar infarcts.

## 2020-07-24 IMAGING — CT CT HEAD W/O CM
3 of 4 series · 15 of 47 positions shown, 18 images · non-contrast
Comparison: CT head [DATE].  MRI brain [DATE].

CLINICAL DATA: Mental status change of unknown cause. Altered
mental status for a few days.

EXAM:
CT HEAD WITHOUT CONTRAST
TECHNIQUE: Contiguous axial images were obtained from the base of the skull
through the vertex without intravenous contrast.

[Series 3: head wo · axial · 0.40mm/px · z∈[+270,+390]mm · 9 of 29 slices shown, 12 images]
[im 3/29  brain]
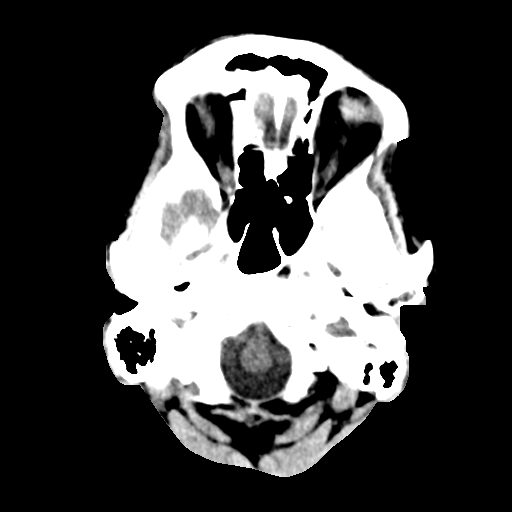
[im 3/29  bone]
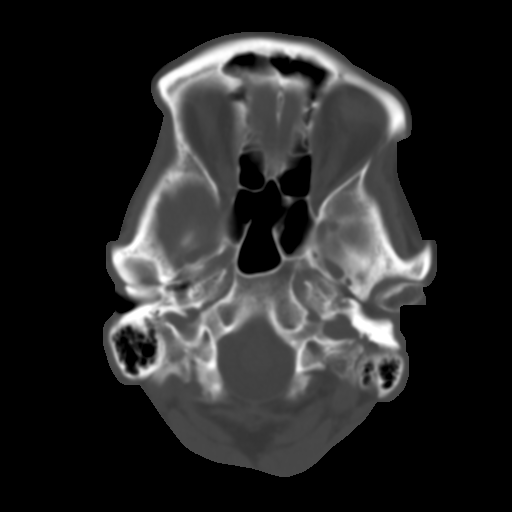
[im 7/29  brain]
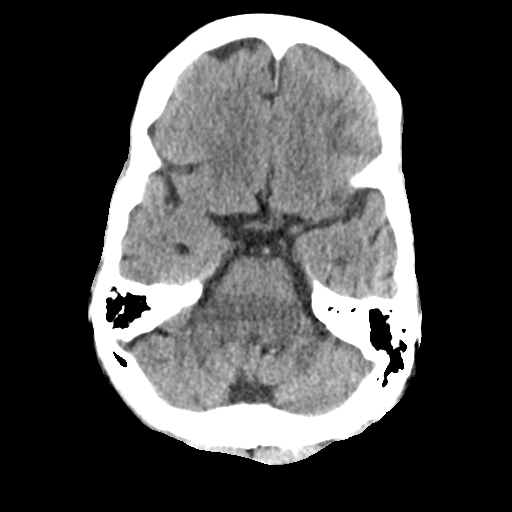
[im 9/29  brain]
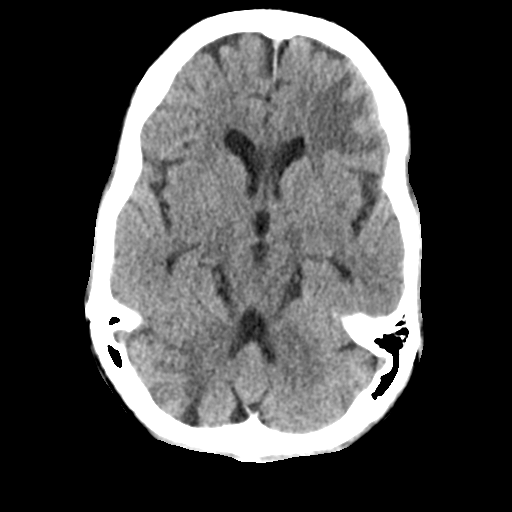
[im 13/29  brain]
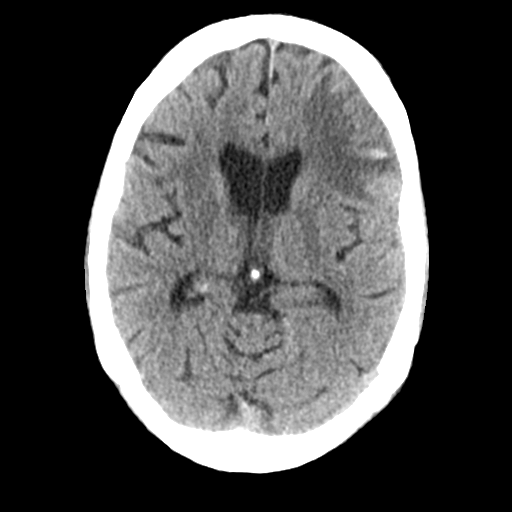
[im 15/29  brain]
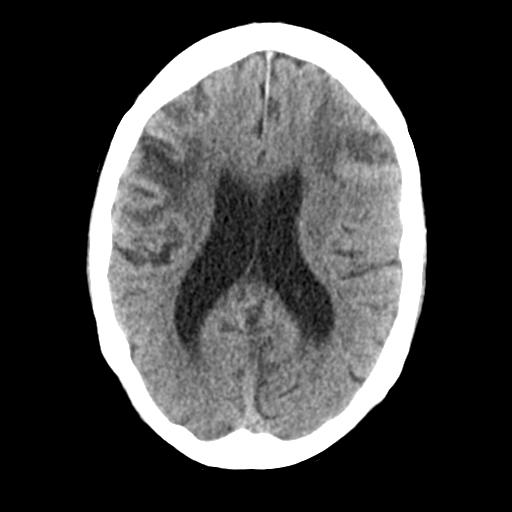
[im 15/29  bone]
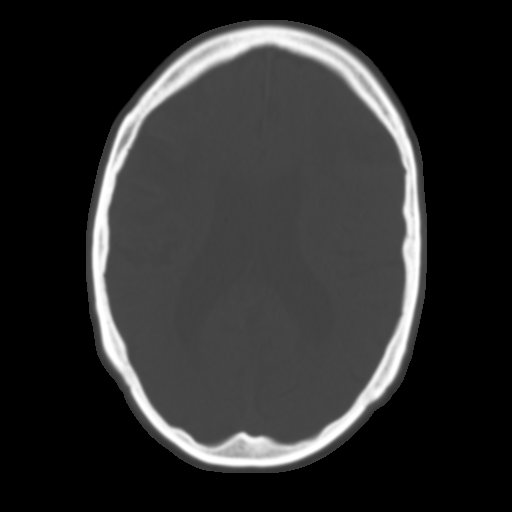
[im 17/29  brain]
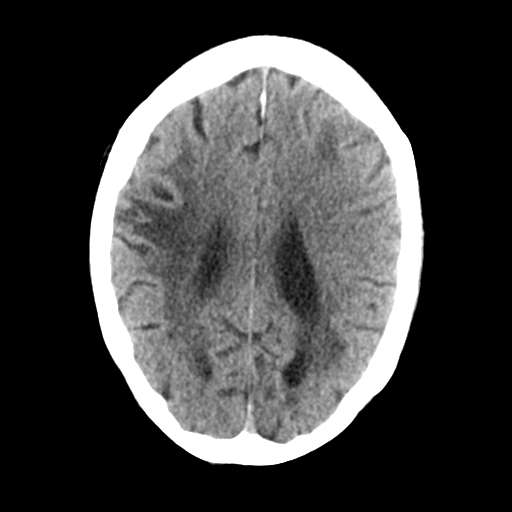
[im 21/29  brain]
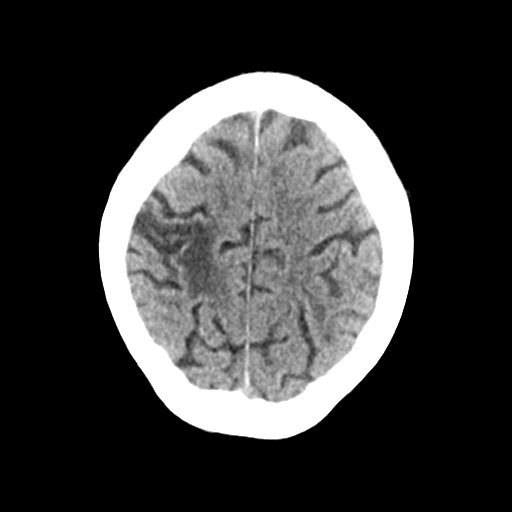
[im 23/29  brain]
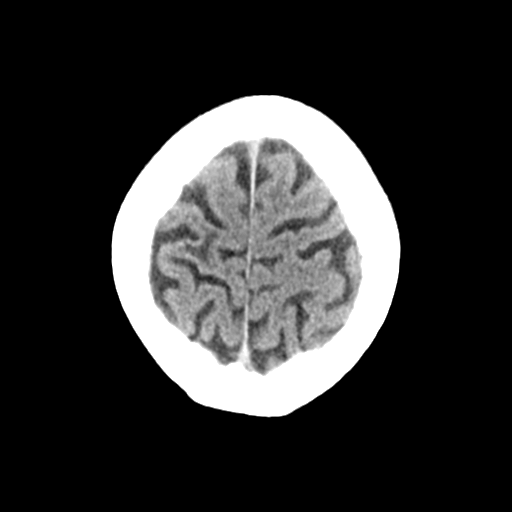
[im 27/29  brain]
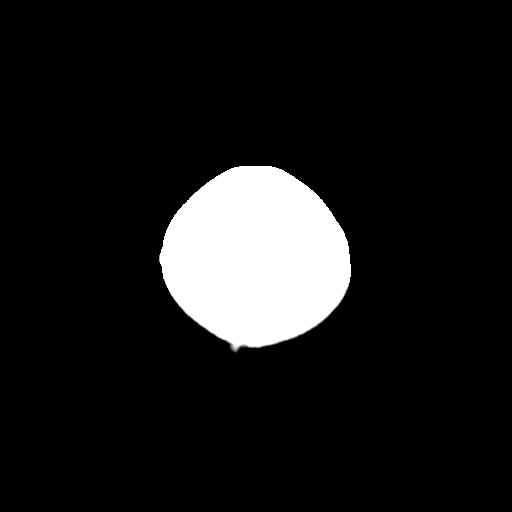
[im 27/29  bone]
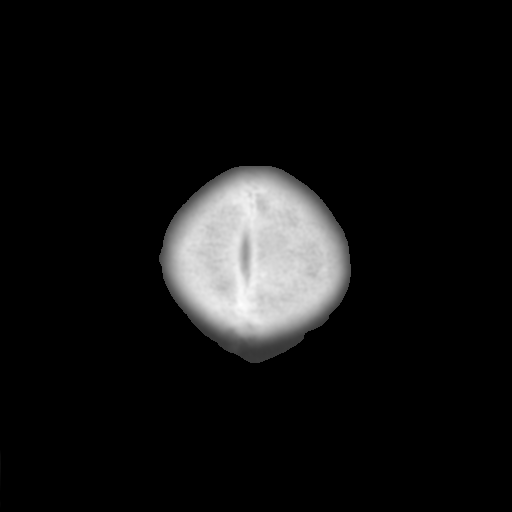

[Series 4: coronal soft tissue · coronal · 0.27mm/px · 3 of 64 slices shown]
[im 22/64  brain]
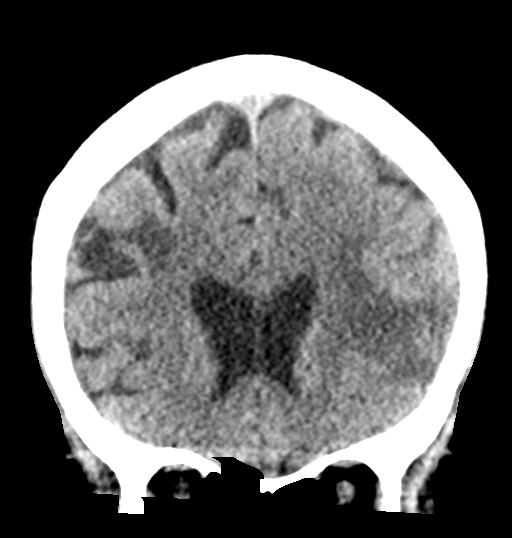
[im 29/64  brain]
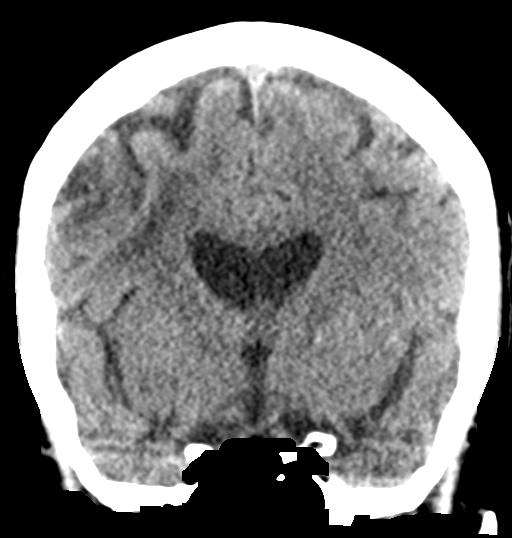
[im 36/64  brain]
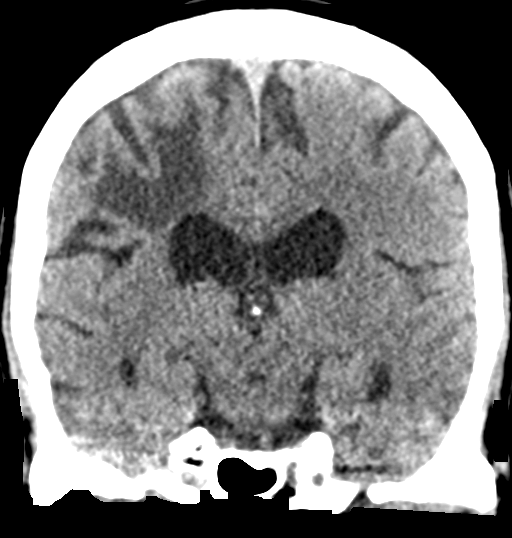

[Series 5: sagittal soft tissue · sagittal · 0.29mm/px · 3 of 47 slices shown]
[im 16/47  brain]
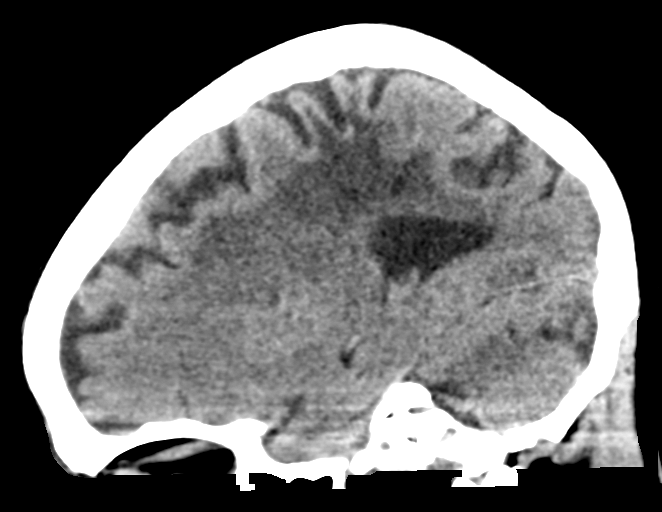
[im 24/47  brain]
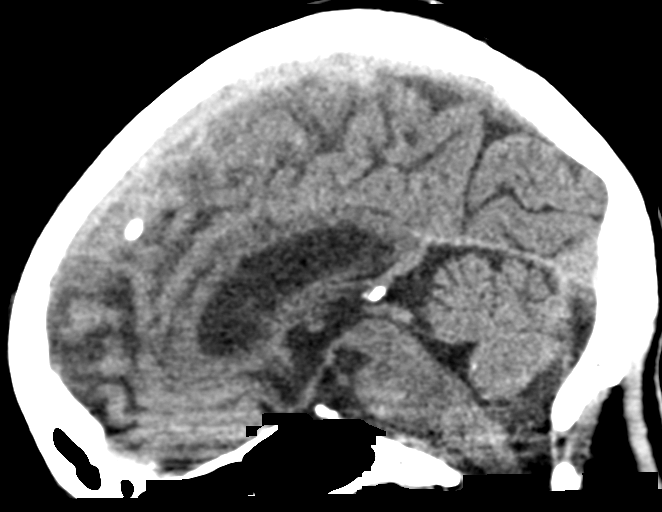
[im 31/47  brain]
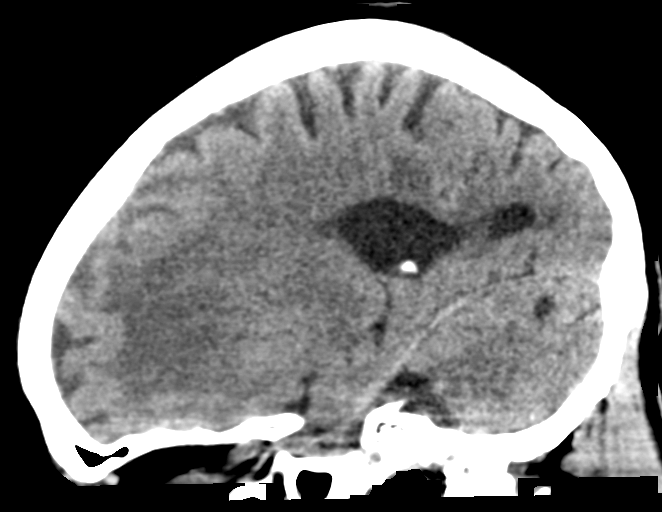

[15 of 47 positions shown; findings below may reference images not displayed]

FINDINGS: Brain: Encephalomalacia in the right frontoparietal region and left
parietal region is unchanged since previous study. New area of
low-attenuation in the left frontal region with associated sulcal
effacement and focal hyperdense material suggesting hemorrhage. This
likely represents an acute or subacute hemorrhagic infarct. No
significant midline shift. No abnormal extra-axial collections.

Vascular: No hyperdense vessel or unexpected calcification.

Skull: Normal. Negative for fracture or focal lesion.

Sinuses/Orbits: No acute finding.

Other: None.
IMPRESSION: 1. New area of low-attenuation in the left frontal region with
associated sulcal effacement and focal hyperdense material
suggesting hemorrhage. This likely represents an acute or subacute
hemorrhagic infarct.
2. Encephalomalacia in the right frontoparietal region and left
parietal region is unchanged since previous study, likely old
infarcts.

Critical Value/emergent results were called by telephone at the time
of interpretation on [DATE] at [DATE] to provider MARTE
MARTE , who verbally acknowledged these results.

## 2020-07-24 MED ORDER — DOCUSATE SODIUM 100 MG PO CAPS: 100.0000 mg | ORAL_CAPSULE | Freq: Two times a day (BID) | ORAL | Status: DC | PRN

## 2020-07-24 MED ORDER — POLYETHYLENE GLYCOL 3350 17 G PO PACK: 17.0000 g | PACK | Freq: Every day | ORAL | Status: DC | PRN

## 2020-07-24 MED ORDER — INSULIN ASPART 100 UNIT/ML ~~LOC~~ SOLN: 4.0000 [IU] | Freq: Once | SUBCUTANEOUS | Status: AC

## 2020-07-24 MED ORDER — SODIUM CHLORIDE 0.9% FLUSH: 3.0000 mL | INTRAVENOUS | Status: DC | PRN

## 2020-07-24 MED ORDER — ACETAMINOPHEN 325 MG PO TABS
650.0000 mg | ORAL_TABLET | ORAL | Status: DC | PRN
  Administered 2020-07-26: 650 mg via ORAL
  Filled 2020-07-24: qty 2

## 2020-07-24 MED ORDER — DEXTROSE 50 % IV SOLN: 0.0000 mL | INTRAVENOUS | Status: DC | PRN

## 2020-07-24 MED ORDER — INSULIN ASPART 100 UNIT/ML ~~LOC~~ SOLN
1.0000 [IU] | SUBCUTANEOUS | Status: DC
  Administered 2020-07-24: 3 [IU] via SUBCUTANEOUS
  Administered 2020-07-25: 2 [IU] via SUBCUTANEOUS
  Filled 2020-07-24 (×2): qty 1

## 2020-07-24 MED ORDER — SODIUM CHLORIDE 0.9 % IV SOLN: Freq: Once | INTRAVENOUS | Status: DC

## 2020-07-24 MED ORDER — IOHEXOL 350 MG/ML SOLN
60.0000 mL | Freq: Once | INTRAVENOUS | Status: AC | PRN
  Administered 2020-07-24: 60 mL via INTRAVENOUS

## 2020-07-24 MED ORDER — NICARDIPINE HCL IN NACL 20-0.86 MG/200ML-% IV SOLN
0.0000 mg/h | INTRAVENOUS | Status: DC
  Administered 2020-07-24: 5 mg/h via INTRAVENOUS
  Administered 2020-07-25: 3 mg/h via INTRAVENOUS
  Filled 2020-07-24 (×4): qty 200

## 2020-07-24 MED ORDER — SODIUM CHLORIDE 0.9 % IV SOLN: 250.0000 mL | INTRAVENOUS | Status: DC | PRN

## 2020-07-24 MED ORDER — LABETALOL HCL 5 MG/ML IV SOLN
5.0000 mg | Freq: Once | INTRAVENOUS | Status: AC
  Administered 2020-07-24: 5 mg via INTRAVENOUS
  Filled 2020-07-24: qty 4

## 2020-07-24 MED ORDER — SODIUM CHLORIDE 0.9 % IV SOLN: Freq: Once | INTRAVENOUS | Status: AC

## 2020-07-24 MED ORDER — SODIUM CHLORIDE 0.9% FLUSH
3.0000 mL | Freq: Two times a day (BID) | INTRAVENOUS | Status: DC
  Administered 2020-07-24 – 2020-07-27 (×8): 3 mL via INTRAVENOUS

## 2020-07-24 MED ORDER — INSULIN DETEMIR 100 UNIT/ML ~~LOC~~ SOLN
5.0000 [IU] | Freq: Two times a day (BID) | SUBCUTANEOUS | Status: DC
  Administered 2020-07-24: 5 [IU] via SUBCUTANEOUS
  Filled 2020-07-24 (×2): qty 0.05

## 2020-07-24 MED ORDER — SODIUM CHLORIDE 0.9 % IV BOLUS
1000.0000 mL | Freq: Once | INTRAVENOUS | Status: AC
  Administered 2020-07-24: 1000 mL via INTRAVENOUS

## 2020-07-24 MED ORDER — POTASSIUM CHLORIDE 10 MEQ/100ML IV SOLN
10.0000 meq | INTRAVENOUS | Status: AC
  Administered 2020-07-24 (×2): 10 meq via INTRAVENOUS
  Filled 2020-07-24 (×2): qty 100

## 2020-07-24 MED ORDER — LEVETIRACETAM 500 MG PO TABS
500.0000 mg | ORAL_TABLET | Freq: Two times a day (BID) | ORAL | Status: DC
  Administered 2020-07-24 – 2020-07-29 (×10): 500 mg via ORAL
  Filled 2020-07-24 (×12): qty 1

## 2020-07-24 MED ORDER — NICARDIPINE HCL IN NACL 20-0.86 MG/200ML-% IV SOLN: 3.0000 mg/h | INTRAVENOUS | Status: DC

## 2020-07-24 MED ORDER — INSULIN ASPART 100 UNIT/ML ~~LOC~~ SOLN
SUBCUTANEOUS | Status: AC
  Administered 2020-07-24: 4 [IU] via INTRAVENOUS
  Filled 2020-07-24: qty 1

## 2020-07-24 MED ORDER — POTASSIUM CHLORIDE 10 MEQ/100ML IV SOLN
10.0000 meq | INTRAVENOUS | Status: AC
  Administered 2020-07-25 (×4): 10 meq via INTRAVENOUS
  Filled 2020-07-24 (×4): qty 100

## 2020-07-24 MED ORDER — ONDANSETRON HCL 4 MG/2ML IJ SOLN: 4.0000 mg | Freq: Four times a day (QID) | INTRAMUSCULAR | Status: DC | PRN

## 2020-07-24 MED ORDER — INSULIN REGULAR(HUMAN) IN NACL 100-0.9 UT/100ML-% IV SOLN
INTRAVENOUS | Status: DC
  Administered 2020-07-24: 4 [IU]/h via INTRAVENOUS
  Filled 2020-07-24: qty 100

## 2020-07-24 MED ORDER — FAMOTIDINE IN NACL 20-0.9 MG/50ML-% IV SOLN
20.0000 mg | Freq: Two times a day (BID) | INTRAVENOUS | Status: DC
  Administered 2020-07-24 – 2020-07-25 (×3): 20 mg via INTRAVENOUS
  Filled 2020-07-24 (×3): qty 50

## 2020-07-24 MED ORDER — INSULIN ASPART 100 UNIT/ML ~~LOC~~ SOLN
6.0000 [IU] | Freq: Once | SUBCUTANEOUS | Status: AC
  Administered 2020-07-24: 6 [IU] via INTRAVENOUS
  Filled 2020-07-24: qty 1

## 2020-07-24 MED ORDER — INSULIN ASPART 100 UNIT/ML ~~LOC~~ SOLN: 4.0000 [IU] | Freq: Once | SUBCUTANEOUS | Status: DC

## 2020-07-24 MED ORDER — LEVETIRACETAM IN NACL 1500 MG/100ML IV SOLN
1500.0000 mg | Freq: Once | INTRAVENOUS | Status: AC
  Administered 2020-07-24: 1500 mg via INTRAVENOUS
  Filled 2020-07-24: qty 100

## 2020-07-24 MED ORDER — STROKE: EARLY STAGES OF RECOVERY BOOK: Freq: Once | Status: DC

## 2020-07-24 MED ORDER — LABETALOL HCL 5 MG/ML IV SOLN
20.0000 mg | Freq: Once | INTRAVENOUS | Status: AC
  Administered 2020-07-24: 20 mg via INTRAVENOUS
  Filled 2020-07-24: qty 4

## 2020-07-24 MED ORDER — SODIUM CHLORIDE 0.9 % IV SOLN: INTRAVENOUS | Status: DC

## 2020-07-24 MED ORDER — DEXTROSE-NACL 5-0.45 % IV SOLN: INTRAVENOUS | Status: DC

## 2020-07-24 NOTE — Progress Notes (Addendum)
PHARMACY CONSULT NOTE - FOLLOW UP  Pharmacy Consult for Electrolyte Monitoring and Replacement   Recent Labs: Potassium (mmol/L)  Date Value  07/24/2020 3.8   Magnesium (mg/dL)  Date Value  02/17/2020 1.8   Calcium (mg/dL)  Date Value  07/24/2020 9.0   Albumin (g/dL)  Date Value  07/24/2020 4.1   Phosphorus (mg/dL)  Date Value  12/30/2019 2.7   Sodium (mmol/L)  Date Value  07/24/2020 139     Assessment: 59 year old female admitted with acute hemorrhagic infarct. Patient with h/o diabetes, significantly hyperglycemic on arrival. Patient started on insulin drip. Pharmacy to manage electrolytes.  Afternoon BMP with K of 3.8 (from 4.9 early AM). AKI resolving with fluids. Insulin drip is being transitioned to SQ insulin. Patient now with diet ordered.  Goal of Therapy:  Electrolytes WNL, K ~ 4 or greater on insulin drip  Plan:  --Will order KCl 10 mEq IV x 2 --Re-check potassium at 2200. Check all electrolytes with morning labs.  Susan Navarro  07/24/2020 5:05 PM

## 2020-07-24 NOTE — ED Notes (Signed)
Susan Navarro [phone] sister

## 2020-07-24 NOTE — ED Notes (Signed)
Called pharmacy and spoke with Cristie Hem. Got long acting insulin rescheduled for 7p so that the transition phase can begin to get pt off insulin drip.

## 2020-07-24 NOTE — Progress Notes (Signed)
PHARMACY CONSULT NOTE - FOLLOW UP  Pharmacy Consult for Electrolyte Monitoring and Replacement   Recent Labs: Potassium (mmol/L)  Date Value  07/24/2020 3.5   Magnesium (mg/dL)  Date Value  02/17/2020 1.8   Calcium (mg/dL)  Date Value  07/24/2020 9.0   Albumin (g/dL)  Date Value  07/24/2020 4.1   Phosphorus (mg/dL)  Date Value  12/30/2019 2.7   Sodium (mmol/L)  Date Value  07/24/2020 139   Assessment: 59 year old female admitted with acute hemorrhagic infarct. Patient with h/o diabetes, significantly hyperglycemic on arrival. Patient started on insulin drip. Pharmacy to manage electrolytes.  Afternoon BMP with K of 3.8 (from 4.9 early AM). AKI resolving with fluids. Insulin drip is being transitioned to SQ insulin. Patient now with diet ordered.  [id number] K+ 3.5, trending down from 3.8  Goal of Therapy:  Electrolytes WNL, K ~ 4 or greater on insulin drip  Plan:  --Will order KCl 10 mEq IV x 4 --Re-check all electrolytes with morning labs.  Hart Robinsons A  07/24/2020 11:42 PM

## 2020-07-24 NOTE — Progress Notes (Signed)
SLP Cancellation Note  Patient Details Name: Susan Navarro MRN: [id number] DOB: [birth year]   Cancelled treatment:       Reason Eval/Treat Not Completed:  (chart reviewed). Per chart notes/review, noted pt to have "progressive parenchymal/subarachnoid hemorrhage along the swollen left frontal lobe, hemorrhagic infarct is again favored", per repeat of Head CT. Pt also pending transfer to ICU. Will hold on ST Cognitive-linguistic evaluation at this time and f/u tomorrow as pt's medical status stabilizes. Recommend frequent oral care, general aspiration precautions.     Orinda Kenner, MS, CCC-SLP Orian Amberg 07/24/2020, 3:15 PM

## 2020-07-24 NOTE — Progress Notes (Signed)
PT Cancellation Note  Patient Details Name: Chanae Gemma MRN: [id number] DOB: [birth year]   Cancelled Treatment:    Reason Eval/Treat Not Completed: Medical issues which prohibited therapy Pt with follow up head CT this am to that shows progressive intracranial hemorrhage.  In ED pending transfer to CCU; will hold PT at this time and follow up tomorrow per medical status/appropriateness.      Kreg Shropshire, DPT 07/24/2020, 2:26 PM

## 2020-07-24 NOTE — H&P (Addendum)
Name: Susan Navarro MRN: [id number] DOB: [birth year]     CONSULTATION DATE: 07/24/2020  REFERRING MD : Charna Archer CHIEF COMPLAINT: acute mental status changes    HISTORY OF PRESENT ILLNESS: 59 y.o. female close past medical history presents to the ER via EMS for chief complaint of elevated blood sugar as well as confusion over past several days.    Reportedly has been taking insulin as prescribed persistently elevated glucoses in the 400s to 500s.  Patient does appear confused.  Unable to provide much additional history.  Has no other complaints at this time  CT head dx of [place] 8/3  1AM patient had follow up CT  Head 8/3 730 AM SAH shows progressive disease  Follow-up head CT does show some progression of bleeding but still with no mass-effect or midline shift, appears most consistent with hemorrhagic stroke per radiology.     ER spoke with Dr. Lacinda Axon of neurosurgery and no operative intervention indicated at this time, he recommends discussion with neurology.    ER spoke with Dr. Lorrin Goodell of neurology, who recommends maintaining systolic blood pressure less than 140, platelet transfusion to keep greater than 100, hold aspirin and DVT prophylaxis.    Neurology and neurosurgery agree that patient may be admitted here to [facility].      PAST MEDICAL HISTORY :   has a past medical history of Anemia, Chronic kidney disease, Diabetes mellitus without complication ([place]), Hypertension, and Stroke ([place]) (03/2018).  has no past surgical history on file. Prior to Admission medications   Medication Sig Start Date End Date Taking? Authorizing Provider  amLODipine (NORVASC) 5 MG tablet Take 5 mg by mouth daily. 12/15/19  Yes [provider]  aspirin EC 81 MG tablet Take 81 mg by mouth daily. 02/09/17  Yes [provider]  atorvastatin (LIPITOR) 80 MG tablet Take 80 mg by mouth daily. 05/24/20  Yes [provider]  Ensure Max Protein (ENSURE MAX PROTEIN) LIQD Take 330 mLs (11 oz  total) by mouth 2 (two) times daily between meals. 12/30/19  Yes Dhungel, Nishant, MD  Insulin Glargine (LANTUS SOLOSTAR) 100 UNIT/ML Solostar Pen Inject 15 Units into the skin daily. Patient taking differently: Inject 22 Units into the skin daily.  02/17/20  Yes Nicole Kindred A, DO  insulin lispro (HUMALOG KWIKPEN) 100 UNIT/ML KwikPen Inject 0.02-0.03 mLs (2-3 Units total) into the skin 3 (three) times daily before meals. 02/17/20  Yes Nicole Kindred A, DO  Insulin Syringe-Needle U-100 (HEALTHWISE INSULIN SYR/NEEDLE) 30G X 5/16" 0.5 ML MISC 1 each by Does not apply route 3 (three) times daily with meals. 02/17/20  Yes Nicole Kindred A, DO  lidocaine (LIDODERM) 5 % Place 1 patch onto the skin daily. Remove & Discard patch within 12 hours or as directed by MD 06/29/20  Yes Nolberto Hanlon, MD  lisinopril (ZESTRIL) 2.5 MG tablet Take 2.5 mg by mouth every evening.    Yes [provider]  Multiple Vitamin (MULTIVITAMIN WITH MINERALS) TABS tablet Take 1 tablet by mouth daily. 06/30/20  Yes Nolberto Hanlon, MD   No Known Allergies  FAMILY HISTORY:  family history includes Diabetes in her brother and mother. SOCIAL HISTORY:  reports that she has never smoked. She has never used smokeless tobacco. She reports that she does not drink alcohol and does not use drugs.    Review of Systems:  Gen:  Denies  fever, sweats, chills weigh loss  Hard time findings words HEENT: Denies blurred vision, double vision, ear pain, eye pain, hearing  loss, nose bleeds, sore throat Cardiac:  No dizziness, chest pain or heaviness, chest tightness,edema, No JVD Resp:   No cough, -sputum production, -shortness of breath,-wheezing, -hemoptysis,  Gi: Denies swallowing difficulty, stomach pain, nausea or vomiting, diarrhea, constipation, bowel incontinence Gu:  Denies bladder incontinence, burning urine Ext:   Denies Joint pain, stiffness or swelling Skin: Denies  skin rash, easy bruising or bleeding or hives Endoc:   Denies polyuria, polydipsia , polyphagia or weight change Psych:   Denies depression, insomnia or hallucinations  Other:  All other systems negative     VITAL SIGNS: Temp:  [98 F (36.7 C)] 98 F (36.7 C) (08/03 0053) Pulse Rate:  [74-80] 75 (08/03 0830) Resp:  [11-20] 20 (08/03 0830) BP: (125-161)/(60-83) 155/71 (08/03 0830) SpO2:  [97 %-100 %] 100 % (08/03 0830) Weight:  [56.7 kg] 56.7 kg (08/03 0053)     SpO2: 100 %    Physical Examination:   GENERAL:NAD, no fevers, chills, no weakness no fatigue +slurred speech intermittant HEAD: Normocephalic, atraumatic.  EYES: PERLA, EOMI No scleral icterus.  MOUTH: Moist mucosal membrane.  EAR, NOSE, THROAT: Clear without exudates. No external lesions.  NECK: Supple.  PULMONARY: CTA B/L no wheezing, rhonchi, crackles CARDIOVASCULAR: S1 and S2. Regular rate and rhythm. No murmurs GASTROINTESTINAL: Soft, nontender, nondistended. Positive bowel sounds.  MUSCULOSKELETAL: No swelling, clubbing, or edema.  NEUROLOGIC: No gross focal neurological deficits. 5/5 strength all extremities SKIN: No ulceration, lesions, rashes, or cyanosis.  PSYCHIATRIC: Insight, judgment intact. -depression -anxiety ALL OTHER ROS ARE NEGATIVE   MEDICATIONS: I have reviewed all medications and confirmed regimen as documented    CULTURE RESULTS   No results found for this or any previous visit (from the past 240 hour(s)).        IMAGING    CT Angio Head W or Wo Contrast  Result Date: 07/24/2020 CLINICAL DATA:  Encephalopathy with intracranial hemorrhage EXAM: CT ANGIOGRAPHY HEAD AND NECK TECHNIQUE: Multidetector CT imaging of the head and neck was performed using the standard protocol during bolus administration of intravenous contrast. Multiplanar CT image reconstructions and MIPs were obtained to evaluate the vascular anatomy. Carotid stenosis measurements (when applicable) are obtained utilizing NASCET criteria, using the distal internal carotid  diameter as the denominator. CONTRAST:  80m OMNIPAQUE IOHEXOL 350 MG/ML SOLN COMPARISON:  None. FINDINGS: CTA NECK FINDINGS SKELETON: There is no bony spinal canal stenosis. No lytic or blastic lesion. OTHER NECK: Normal pharynx, larynx and major salivary glands. No cervical lymphadenopathy. Unremarkable thyroid gland. UPPER CHEST: No pneumothorax or pleural effusion. No nodules or masses. AORTIC ARCH: There is no calcific atherosclerosis of the aortic arch. There is no aneurysm, dissection or hemodynamically significant stenosis of the visualized portion of the aorta. Conventional 3 vessel aortic branching pattern. The visualized proximal subclavian arteries are widely patent. RIGHT CAROTID SYSTEM: Normal without aneurysm, dissection or stenosis. LEFT CAROTID SYSTEM: Normal without aneurysm, dissection or stenosis. VERTEBRAL ARTERIES: Left dominant configuration. Both origins are clearly patent. There is no dissection, occlusion or flow-limiting stenosis to the skull base (V1-V3 segments). CTA HEAD FINDINGS POSTERIOR CIRCULATION: --Vertebral arteries: Normal V4 segments. --Inferior cerebellar arteries: Normal. --Basilar artery: Normal. --Superior cerebellar arteries: Normal. --Posterior cerebral arteries (PCA): Normal. ANTERIOR CIRCULATION: --Intracranial internal carotid arteries: Normal. --Anterior cerebral arteries (ACA): Normal. Both A1 segments are present. Patent anterior communicating artery (a-comm). --Middle cerebral arteries (MCA): Normal. VENOUS SINUSES: As permitted by contrast timing, patent. ANATOMIC VARIANTS: None Review of the MIP images confirms the above findings. IMPRESSION: 1. Normal CTA  of the head and neck. No aneurysm, vascular malformation or large vessel occlusion. 2. Unchanged focus of hemorrhage of the left frontal lobe, better characterized on earlier noncontrast study. Electronically Signed   By: Ulyses Jarred M.D.   On: 07/24/2020 03:16   CT Head Wo Contrast  Result Date:  07/24/2020 CLINICAL DATA:  Follow-up intracranial hemorrhage EXAM: CT HEAD WITHOUT CONTRAST TECHNIQUE: Contiguous axial images were obtained from the base of the skull through the vertex without intravenous contrast. COMPARISON:  Earlier today FINDINGS: Brain: Edematous appearance and swelling with cortical/subarachnoid hemorrhage in the anterior and lateral left frontal lobe. The degree of hemorrhage appears increased without discrete hematoma. No progressive swelling and no midline shift. Remote high right frontal, left posterior frontal, and left occipital cortically based infarcts. Small remote right more than left cerebellar infarcts. No hydrocephalus. Vascular: No hyperdense vessel. Skull: Negative Sinuses/Orbits: Bilateral cataract resection IMPRESSION: 1. Progressive parenchymal/subarachnoid hemorrhage along the swollen left frontal lobe, hemorrhagic infarct is again favored. Recommend continued follow-up. 2. Remote cerebral and cerebellar infarcts. Electronically Signed   By: Monte Fantasia M.D.   On: 07/24/2020 07:39   CT Head Wo Contrast  Result Date: 07/24/2020 CLINICAL DATA:  Mental status change of unknown cause. Altered mental status for a few days. EXAM: CT HEAD WITHOUT CONTRAST TECHNIQUE: Contiguous axial images were obtained from the base of the skull through the vertex without intravenous contrast. COMPARISON:  CT head 12/25/2019.  MRI brain 12/25/2019. FINDINGS: Brain: Encephalomalacia in the right frontoparietal region and left parietal region is unchanged since previous study. New area of low-attenuation in the left frontal region with associated sulcal effacement and focal hyperdense material suggesting hemorrhage. This likely represents an acute or subacute hemorrhagic infarct. No significant midline shift. No abnormal extra-axial collections. Vascular: No hyperdense vessel or unexpected calcification. Skull: Normal. Negative for fracture or focal lesion. Sinuses/Orbits: No acute finding.  Other: None. IMPRESSION: 1. New area of low-attenuation in the left frontal region with associated sulcal effacement and focal hyperdense material suggesting hemorrhage. This likely represents an acute or subacute hemorrhagic infarct. 2. Encephalomalacia in the right frontoparietal region and left parietal region is unchanged since previous study, likely old infarcts. Critical Value/emergent results were called by telephone at the time of interpretation on 07/24/2020 at 1:36 am to provider Merlyn Lot , who verbally acknowledged these results. Electronically Signed   By: Lucienne Capers M.D.   On: 07/24/2020 01:39   CT Angio Neck W and/or Wo Contrast  Result Date: 07/24/2020 CLINICAL DATA:  Encephalopathy with intracranial hemorrhage EXAM: CT ANGIOGRAPHY HEAD AND NECK TECHNIQUE: Multidetector CT imaging of the head and neck was performed using the standard protocol during bolus administration of intravenous contrast. Multiplanar CT image reconstructions and MIPs were obtained to evaluate the vascular anatomy. Carotid stenosis measurements (when applicable) are obtained utilizing NASCET criteria, using the distal internal carotid diameter as the denominator. CONTRAST:  50m OMNIPAQUE IOHEXOL 350 MG/ML SOLN COMPARISON:  None. FINDINGS: CTA NECK FINDINGS SKELETON: There is no bony spinal canal stenosis. No lytic or blastic lesion. OTHER NECK: Normal pharynx, larynx and major salivary glands. No cervical lymphadenopathy. Unremarkable thyroid gland. UPPER CHEST: No pneumothorax or pleural effusion. No nodules or masses. AORTIC ARCH: There is no calcific atherosclerosis of the aortic arch. There is no aneurysm, dissection or hemodynamically significant stenosis of the visualized portion of the aorta. Conventional 3 vessel aortic branching pattern. The visualized proximal subclavian arteries are widely patent. RIGHT CAROTID SYSTEM: Normal without aneurysm, dissection or stenosis. LEFT CAROTID  SYSTEM: Normal without  aneurysm, dissection or stenosis. VERTEBRAL ARTERIES: Left dominant configuration. Both origins are clearly patent. There is no dissection, occlusion or flow-limiting stenosis to the skull base (V1-V3 segments). CTA HEAD FINDINGS POSTERIOR CIRCULATION: --Vertebral arteries: Normal V4 segments. --Inferior cerebellar arteries: Normal. --Basilar artery: Normal. --Superior cerebellar arteries: Normal. --Posterior cerebral arteries (PCA): Normal. ANTERIOR CIRCULATION: --Intracranial internal carotid arteries: Normal. --Anterior cerebral arteries (ACA): Normal. Both A1 segments are present. Patent anterior communicating artery (a-comm). --Middle cerebral arteries (MCA): Normal. VENOUS SINUSES: As permitted by contrast timing, patent. ANATOMIC VARIANTS: None Review of the MIP images confirms the above findings. IMPRESSION: 1. Normal CTA of the head and neck. No aneurysm, vascular malformation or large vessel occlusion. 2. Unchanged focus of hemorrhage of the left frontal lobe, better characterized on earlier noncontrast study. Electronically Signed   By: Ulyses Jarred M.D.   On: 07/24/2020 03:16       ASSESSMENT AND PLAN SYNOPSIS  ACUTE SAH Avoid secondary brain injury Start insulin drip for elevated FSBS Follow up NEURO and NEURO SURGERY RECS BP control Check UDS Check ECHO, LIPID PANEL COVID TEST PENDING High risk for progression   ELECTROLYTES -follow labs as needed -replace as needed -pharmacy consultation and following  ENDO - ICU hypoglycemic\Hyperglycemia protocol -check FSBS per protocol   DVT/GI PRX ordered and assessed TRANSFUSIONS AS NEEDED MONITOR FSBS I Assessed the need for Labs I Assessed the need for Foley I Assessed the need for Central Venous Line Family Discussion when available I Assessed the need for Mobilization I made an Assessment of medications to be adjusted accordingly Safety Risk assessment completed  CASE DISCUSSED IN MULTIDISCIPLINARY ROUNDS WITH ICU  TEAM      Critical Care Time devoted to patient care services described in this note is 55 minutes.   Overall, patient is critically ill, prognosis is guarded.     Corrin Parker, M.D.  [facility]  Medical Director [place] Director [facility] Cardio-Pulmonary Department

## 2020-07-24 NOTE — Progress Notes (Signed)
eeg done °

## 2020-07-24 NOTE — ED Notes (Signed)
Pt being screened by MRI on the phone at this time.

## 2020-07-24 NOTE — ED Notes (Signed)
Neurology at bedside.

## 2020-07-24 NOTE — ED Notes (Signed)
Pt came back from MRI. EEG now in progress.

## 2020-07-24 NOTE — ED Provider Notes (Signed)
[facility] Emergency Department Provider Note    First MD Initiated Contact with Patient 07/24/20 [phone]     (approximate)  I have reviewed the triage vital signs and the nursing notes.   HISTORY  Chief Complaint Altered Mental Status  Level V Caveat: AMS  HPI Susan Navarro is a 59 y.o. female close past medical history presents to the ER via EMS for chief complaint of elevated blood sugar as well as confusion over past several days.  Reportedly has been taking insulin as prescribed persistently elevated glucoses in the 400s to 500s.  Patient does appear confused.  Unable to provide much additional history.  Has no other complaints at this time.    Past Medical History:  Diagnosis Date  . Anemia   . Chronic kidney disease   . Diabetes mellitus without complication ([place])   . Hypertension   . Stroke [facility]) 03/2018   Ischemic stroke per [facility] records   Family History  Problem Relation Age of Onset  . Diabetes Mother   . Diabetes Brother    History reviewed. No pertinent surgical history. Patient Active Problem List   Diagnosis Date Noted  . Fall 06/27/2020  . HTN (hypertension) 02/16/2020  . CKD (chronic kidney disease), stage IV ([place]) 12/30/2019  . Other cirrhosis of liver ([place]) 12/30/2019  . Thrombocytopenia ([place]) 12/30/2019  . 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 Transaminitis 12/30/2019  . Malnutrition of moderate degree 12/28/2019  . Anemia of chronic disease   . Generalized weakness   . Altered mental status   . DKA (diabetic ketoacidoses) ([place]) 12/25/2019  . Hyponatremia   . Hyperkalemia   . Stroke [facility]) 03/2018      Prior to Admission medications   Medication Sig Start Date End Date Taking? Authorizing Provider  amLODipine (NORVASC) 5 MG tablet Take 5 mg by mouth daily. 12/15/19  Yes [provider]  aspirin EC 81 MG tablet Take 81 mg by mouth daily. 02/09/17  Yes [provider]  atorvastatin (LIPITOR) 80 MG tablet Take 80 mg by mouth daily. 05/24/20  Yes [provider]  Ensure Max Protein (ENSURE MAX PROTEIN) LIQD Take 330 mLs (11 oz total) by mouth 2 (two) times daily between meals. 12/30/19  Yes Dhungel, Nishant, MD  Insulin Glargine (LANTUS SOLOSTAR) 100 UNIT/ML Solostar Pen Inject 15 Units into the skin daily. Patient taking differently: Inject 22 Units into the skin daily.  02/17/20  Yes Nicole Kindred A, DO  insulin lispro (HUMALOG KWIKPEN) 100 UNIT/ML KwikPen Inject 0.02-0.03 mLs (2-3 Units total) into the skin 3 (three) times daily before meals. 02/17/20  Yes Nicole Kindred A, DO  Insulin Syringe-Needle U-100 (HEALTHWISE INSULIN SYR/NEEDLE) 30G X 5/16" 0.5 ML MISC 1 each by Does not apply route 3 (three) times daily with meals. 02/17/20  Yes Nicole Kindred A, DO  lidocaine (LIDODERM) 5 % Place 1 patch onto the skin daily. Remove & Discard patch within 12 hours or as directed by MD 06/29/20  Yes Nolberto Hanlon, MD  lisinopril (ZESTRIL) 2.5 MG tablet Take 2.5 mg by mouth every evening.    Yes [provider]  Multiple Vitamin (MULTIVITAMIN WITH MINERALS) TABS tablet Take 1 tablet by mouth daily. 06/30/20  Yes Nolberto Hanlon, MD    Allergies Patient has no known allergies.    Social History Social History   Tobacco Use  . Smoking status: Never Smoker  . Smokeless tobacco: Never Used  Vaping Use  .  Vaping Use: Never used  Substance Use Topics  . Alcohol use: No  . Drug use: Never    Review of Systems Patient denies headaches, rhinorrhea, blurry vision, numbness, shortness of breath, chest pain, edema, cough, abdominal pain, nausea, vomiting, diarrhea, dysuria, fevers, rashes or hallucinations unless otherwise stated above in HPI. ____________________________________________   PHYSICAL EXAM:  VITAL SIGNS: Vitals:   07/24/20 0530 07/24/20 0531  BP: 125/60   Pulse:  74  Resp: 18 20  Temp:    SpO2:  100%     Constitutional: Alert and onontoxic appearing Eyes: Conjunctivae are normal.  Head: Atraumatic. Nose: No congestion/rhinnorhea. Mouth/Throat: Mucous membranes are moist.   Neck: No stridor. Painless ROM.  Cardiovascular: Normal rate, regular rhythm. Grossly normal heart sounds.  Good peripheral circulation. Respiratory: Normal respiratory effort.  No retractions. Lungs CTAB. Gastrointestinal: Soft and nontender. No distention. No abdominal bruits. No CVA tenderness. Genitourinary:  Musculoskeletal: No lower extremity tenderness nor edema.  No joint effusions. Neurologic:  . Intermittent expressive aphasia,  no facial droop. No gross focal neurologic deficits are appreciated. No facial droop Skin:  Skin is warm, dry and intact. No rash noted. Psychiatric: Mood and affect are normal. Speech and behavior are normal.  ____________________________________________   LABS (all labs ordered are listed, but only abnormal results are displayed)  Results for orders placed or performed during the hospital encounter of 07/24/20 (from the past 24 hour(s))  Glucose, capillary     Status: Abnormal   Collection Time: 07/24/20 12:33 AM  Result Value Ref Range   Glucose-Capillary 474 (H) 70 - 99 mg/dL  CBC with Differential/Platelet     Status: Abnormal   Collection Time: 07/24/20 12:48 AM  Result Value Ref Range   WBC 4.3 4.0 - 10.5 K/uL   RBC 3.52 (L) 3.87 - 5.11 MIL/uL   Hemoglobin 10.8 (L) 12.0 - 15.0 g/dL   HCT 32.6 (L) 36 - 46 %   MCV 92.6 80.0 - 100.0 fL   MCH 30.7 26.0 - 34.0 pg   MCHC 33.1 30.0 - 36.0 g/dL   RDW 13.0 11.5 - 15.5 %   Platelets 100 (L) 150 - 400 K/uL   nRBC 0.0 0.0 - 0.2 %   Neutrophils Relative % 60 %   Neutro Abs 2.6 1.7 - 7.7 K/uL   Lymphocytes Relative 27 %   Lymphs Abs 1.2 0.7 - 4.0 K/uL   Monocytes Relative 9 %   Monocytes Absolute 0.4 0 - 1 K/uL   Eosinophils Relative 3 %   Eosinophils Absolute 0.1 0 - 0 K/uL   Basophils Relative 1 %   Basophils  Absolute 0.0 0 - 0 K/uL   Immature Granulocytes 0 %   Abs Immature Granulocytes 0.00 0.00 - 0.07 K/uL  Comprehensive metabolic panel     Status: Abnormal   Collection Time: 07/24/20 12:48 AM  Result Value Ref Range   Sodium 131 (L) 135 - 145 mmol/L   Potassium 4.9 3.5 - 5.1 mmol/L   Chloride 91 (L) 98 - 111 mmol/L   CO2 27 22 - 32 mmol/L   Glucose, Bld 549 (HH) 70 - 99 mg/dL   BUN 68 (H) 6 - 20 mg/dL   Creatinine, Ser 2.30 (H) 0.44 - 1.00 mg/dL   Calcium 9.9 8.9 - 10.3 mg/dL   Total Protein 7.8 6.5 - 8.1 g/dL   Albumin 4.1 3.5 - 5.0 g/dL   AST 18 15 - 41 U/L   ALT 71 (H) 0 - 44 U/L  Alkaline Phosphatase 608 (H) 38 - 126 U/L   Total Bilirubin 0.8 0.3 - 1.2 mg/dL   GFR calc non Af Amer 22 (L) >60 mL/min   GFR calc Af Amer 26 (L) >60 mL/min   Anion gap 13 5 - 15  Urinalysis, Complete w Microscopic     Status: Abnormal   Collection Time: 07/24/20 12:48 AM  Result Value Ref Range   Color, Urine YELLOW (A) YELLOW   APPearance HAZY (A) CLEAR   Specific Gravity, Urine 1.013 1.005 - 1.030   pH 6.0 5.0 - 8.0   Glucose, UA >=500 (A) NEGATIVE mg/dL   Hgb urine dipstick NEGATIVE NEGATIVE   Bilirubin Urine NEGATIVE NEGATIVE   Ketones, ur 5 (A) NEGATIVE mg/dL   Protein, ur NEGATIVE NEGATIVE mg/dL   Nitrite NEGATIVE NEGATIVE   Leukocytes,Ua NEGATIVE NEGATIVE   RBC / HPF 0-5 0 - 5 RBC/hpf   WBC, UA 0-5 0 - 5 WBC/hpf   Bacteria, UA NONE SEEN NONE SEEN   Squamous Epithelial / LPF 0-5 0 - 5  Osmolality     Status: Abnormal   Collection Time: 07/24/20 12:48 AM  Result Value Ref Range   Osmolality 320 (H) 275 - 295 mOsm/kg  Protime-INR     Status: None   Collection Time: 07/24/20  2:04 AM  Result Value Ref Range   Prothrombin Time 13.6 11.4 - 15.2 seconds   INR 1.1 0.8 - 1.2  Glucose, capillary     Status: Abnormal   Collection Time: 07/24/20  4:18 AM  Result Value Ref Range   Glucose-Capillary 369 (H) 70 - 99 mg/dL   ____________________________________________  EKG My review  and personal interpretation at Time: 0:29   Indication: ams  Rate: 80  Rhythm: sinus Axis: normal Other: poor r wave progression, no stemi ____________________________________________  RADIOLOGY  I personally reviewed all radiographic images ordered to evaluate for the above acute complaints and reviewed radiology reports and findings.  These findings were personally discussed with the patient.  Please see medical record for radiology report.  ____________________________________________   PROCEDURES  Procedure(s) performed:  .Critical Care Performed by: Merlyn Lot, MD Authorized by: Merlyn Lot, MD   Critical care provider statement:    Critical care time (minutes):  35   Critical care time was exclusive of:  Separately billable procedures and treating other patients   Critical care was necessary to treat or prevent imminent or life-threatening deterioration of the following conditions:  CNS failure or compromise and metabolic crisis   Critical care was time spent personally by me on the following activities:  Development of treatment plan with patient or surrogate, discussions with consultants, evaluation of patient's response to treatment, examination of patient, obtaining history from patient or surrogate, ordering and performing treatments and interventions, ordering and review of laboratory studies, ordering and review of radiographic studies, pulse oximetry, re-evaluation of patient's condition and review of old charts      Critical Care performed: yes ____________________________________________   INITIAL IMPRESSION / [place] / ED COURSE  Pertinent labs & imaging results that were available during my care of the patient were reviewed by me and considered in my medical decision making (see chart for details).   DDX: Dehydration, sepsis, pna, uti, hypoglycemia, cva, drug effect, withdrawal, encephalitis   Susan Navarro is a 59 y.o. who presents  to the ED with presentation as described above.  Patient with ongoing confusion for the past 2 to 3 days.  cbg does show evidence  of hyperglycemia.  Will add on blood work.  Will order CT imaging.  Does not have any lateralizing or focal deficits.  Clinical Course as of Jul 24 724  Tue Jul 24, 2020  0151 CT imaging evidence with acute or subacute hemorrhagic stroke which explain the patient's intermittent aphasia and symptoms and altered mental status for the past 3 days.  She is currently speaking fluently also noted to have mild AKI and hyperglycemia.   [PR]  0211 Was discussed in consultation with neurosurgery, Dr. Lacinda Axon, who is evaluated the images.  Does have blood but nothing that suggest neurosurgical intervention at this time but has recommended urgent CTA to exclude aneurysm.  Will give IV fluids that she has mild CKD but I suspect this is component of some dehydration.  I do feel that the CTA is needed despite renal dysfunction at this time.   [PR]  [phone] CTA without evidence of aneurysm.  Per neurosurgery recommendations will order repeat CT without after 6-hour interval to ensure that there is no evolution hemorrhage.   [PR]    Clinical Course User Index [PR] Merlyn Lot, MD   Patient signed out to oncoming physician Dr. Charna Archer to follow up repeat CT to determine need for transfer versus admission to this facility.  The patient was evaluated in Emergency Department today for the symptoms described in the history of present illness. He/she was evaluated in the context of the global COVID-19 pandemic, which necessitated consideration that the patient might be at risk for infection with the SARS-CoV-2 virus that causes COVID-19. Institutional protocols and algorithms that pertain to the evaluation of patients at risk for COVID-19 are in a state of rapid change based on information released by regulatory bodies including the CDC and federal and state organizations. These policies and  algorithms were followed during the patient's care in the ED.  As part of my medical decision making, I reviewed the following data within the [facility] notes reviewed and incorporated, Labs reviewed, notes from prior ED visits and [place] Controlled Substance Database   ____________________________________________   FINAL CLINICAL IMPRESSION(S) / ED DIAGNOSES  Final diagnoses:  Altered mental status, unspecified altered mental status type  Cerebrovascular accident (CVA), unspecified mechanism ([place])  Hyperglycemia  AKI (acute kidney injury) ([place])      NEW MEDICATIONS STARTED DURING THIS VISIT:  New Prescriptions   No medications on file     Note:  This document was prepared using Dragon voice recognition software and may include unintentional dictation errors.    Merlyn Lot, MD 07/24/20 [phone]

## 2020-07-24 NOTE — ED Notes (Signed)
Neurosurgery consult and ICU MD at bedside.

## 2020-07-24 NOTE — ED Notes (Signed)
Resent light green to lab at this time for BMP.

## 2020-07-24 NOTE — ED Notes (Signed)
Echo being performed at bedside

## 2020-07-24 NOTE — ED Notes (Signed)
Pt given ice water and graham crackers. Okayed with MD Quentin Cornwall for "crackers and clear liquids"

## 2020-07-24 NOTE — Consult Note (Signed)
Date of service: July 24, 2020 Patient Name: Susan Navarro MRN: [id number] DOB: 04/25/1961 Reason for consult: "stroke, L frontal with hemorrhagic transformation"  Susan Navarro is a 59 y.o. female  has a past medical history of Anemia, Chronic kidney disease, Diabetes mellitus without complication ([place]), Hypertension, and Stroke ([place]) (03/2018) who presents with episode of confusion. Patient does have some aphasia on my evaluation which limits the history and probably will need colateral. She reports that she had a fall about a week ago and has a fracture of her pelvis.  She went to bed at 6pm yesterday, woke up at 11PM and both daughter and boyfiend mentioned that she was not herself and confused. She endorses that she was unable to talk. Could not get her words out.  I spoke to daughter over the phone who reported that Ms. Sollers has not been herself for the last few days. She had a couple episodes of starring off. This would last about a minute. Sometimes she just seems to be mumbling. Per daughter, boyfriend told that she cant get her words out.  Ms. Lai had a fall and broker her hip about 3-4 weeks ago. She takes a baby aspirin at home.  No family hx of strokes that she is aware of. ROS ROS: - CONSTITUTIONAL: Denies weight loss, fever and chills. - HEENT: Denies changes in vision and hearing. - RESPIRATORY: Denies SOB and cough. - CV: Denies palpitations and CP. - GI: Denies abdominal pain, nausea, vomiting and diarrhea. - GU: Denies dysuria and urinary frequency. - MSK: Denies myalgia and joint pain. - SKIN: Denies rash and pruritus. - NEUROLOGICAL: Denies headache and syncope. - PSYCHIATRIC: Denies recent changes in mood. Denies anxiety and depression. Past History Past Medical History:  Diagnosis Date  . Anemia   . Chronic kidney disease   . Diabetes mellitus without complication ([place])   . Hypertension   . Stroke [facility]) 03/2018   Ischemic stroke per [facility] records   History  reviewed. No pertinent surgical history. Family History  Problem Relation Age of Onset  . Diabetes Mother   . Diabetes Brother    Social History   Tobacco Use  . Smoking status: Never Smoker  . Smokeless tobacco: Never Used  Vaping Use  . Vaping Use: Never used  Substance Use Topics  . Alcohol use: No  . Drug use: Never   No Known Allergies   (Not in a hospital admission)    Temp:  [98 F (36.7 C)] 98 F (36.7 C) (08/03 0053) Pulse Rate:  [74-82] 82 (08/03 1013) Resp:  [8-20] 16 (08/03 1015) BP: (125-161)/(60-83) 151/80 (08/03 1015) SpO2:  [97 %-100 %] 99 % (08/03 1013) Weight:  [56.7 kg] 56.7 kg (08/03 0053) Body mass index is 20.18 kg/m.  General: Laying comfortably in bed; in no acute distress. HENT: Normal oropharynx and mucosa. Normal external appearance of ears and nose. Neck: Supple, no pain or tenderness. CV: RRR without murmur. No peripheral edema. Pulmonary: Symmetric chest rise. Normal respiratoy effort. Abdomen: positive bowel sounds, soft, non-tender. Normal liver and spleen size. Ext: No cyanosis, edema, or deformity. Skin: No rash. Normal palpation of skin. Musculoskeletal: Normal digits and nails by inspection. No clubbing.  Neurologic Examination  MENTAL STATUS: AAOx self and place but misses out on the year, she has receptive aphasia. LANG/SPEECH: Fluent, unable to name. Repetition intact, follows 3-step commands CRANIAL NERVES: II: Pupils equal and reactive, no RAPD, no VF deficits, normal fundus III, IV, VI: EOM intact, no gaze  preference or deviation, no nystagmus. V: normal sensation in V1, V2, and V3 segments bilaterally VII: no asymmetry, no nasolabial fold flattening VIII: normal hearing to speech IX, X: normal palatal elevation, no uvular deviation XI: 5/5 head turn and 5/5 shoulder shrug bilaterally XII: midline tongue protrusion MOTOR: 5/5 muscle power in Rt shoulder abductors/adductors, elbow flexors/extensors, wrist  flexors/extensors, finger abductors/adductors.  5/5 in Rt hipflexors/extensors, knee flexors/extensors, ankle dorsiflexors and planter flexors.   5/5 muscle power in Lt shoulder abductors/adductors, elbow flexors/extensors, wrist flexors/extensors, finger abductors/adductors.  5/5 in Lt hipflexors/extensors, knee flexors/extensors, ankle dorsiflexors and planter flexors.   REFLEXES: 2/4 throughout, bilateral flexor planter response, no Hoffman's, no clonus SENSORY: Normal to touch in all limbs No hemineglect, no extinction to double sided stimulation (visual & tactile) Romberg absent Coordination: Normal finger to nose and heel to shin, no tremor, no dysmetria  Imaging/Labs/Diagnostics: Initial CTH:   Impression and plan: Elizabelle Fite is a 59 y.o. female with PMH significant for poorly controlled diabetes, hypertension, hyperlipidemia, prior strokes which patient is not aware of who presents with confusion over the last few days and episodes of staring off.  CT head in the ED concerning for a left frontal infarct with some hemorrhagic transformation.  Repeat CT head shows some questionable progression of the hemorrhage.  Neuro exam with mild aphasia out of proportion to encephalopathy but no other focal deficit. She was also noted to have high blood glucose level which was elevated to 474 at presentation.  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 Suspect focal seizures with retained awareness. Hyperglycemia.  Plan: - Admit to ICU - Maintain SBP < 140 using nicardipine gtt if needed - No heparin, ASA - Repeat CTH in 6 hours - completed and demonstrates some ?progression of hemorrhage. - I ordered repeat CTH in 24 hours - NIHSS per protocol - Neurosurgery consulted in the ED - Platelets>100K, INR<1.4 - Brain imaging- MRI Brain ordered - Vascular imaging- CTA Brain/Neck with no aneurysm, no LVO. - TTE - ordered and pending - Lipid panel - ordered   - Statin - Continue home Atorvastatin 41m daily. - A1C - ordered  - Telemetry monitoring for arrythmia- ordered - Swallow screen - ordered - Stroke education - ordered  - PT/OT/SLP consulted  Suspected partial seizures with retained awareness: Daughter endorses patient having episodes of starring off lasting about a minute over the last couple of days. Given new ICH, suspect this could be seizures. - Keppra load of 15051mx 1 ordered - Started on Keppra 50035mID - routine EEG.   Hemorrhagic Stroke Core Measures -NHISS on admission 1, ICH score 0 -Pharmacological (SQ heparin) DVT prophylaxis will be started after stable HCT. -Antiplatelet therapy is not indicated. -Anticoagulation therapy not indicated in hemorrhagic stroke. -Patients LDL and HgbA1c will be checked and the patient will be discharged on a lipid lowering Statin medication unless contraindicated. -Dysphagia screening ordered, and will be completed prior to patient receiving oral intake. -Stroke education booklet has been ordered and will be provided by the RN that includes both written and verbal education to the patient and family regarding hemorrhagic strokes. We have reviewed the patient's personal modifiable risk factors including: HTN, as well as education on reducing these risk factors.   -Patient is being assessed for Rehab by PT/OT/Speech and PM&R if indicated.  ______________________________________________________________________  Thank you for the opportunity to take part in the care of this patient. If you have any further questions, please contact the neurology consultation attending on  call. Signed,   Isacc Turney [place] Pager Number [phone]

## 2020-07-24 NOTE — Procedures (Signed)
Patient Name: Susan Navarro  MRN: [id number]  Epilepsy Attending: Lora Havens  Referring Physician/Provider: Dr Donnetta Simpers Date: 07/24/2020 Duration: 22.46 mins  Patient history: 59yo F presented with confusion, staring off. EEG to evaluate for seizure  Level of alertness: awake  AEDs during EEG study: LEV  Technical aspects: This EEG study was done with scalp electrodes positioned according to the 10-20 International system of electrode placement. Electrical activity was acquired at a sampling rate of 500Hz  and reviewed with a high frequency filter of 70Hz  and a low frequency filter of 1Hz . EEG data were recorded continuously and digitally stored.   Description: No posterior dominant rhythm was seen. EEG showed continuous generalized 3 to 6 Hz theta-delta slowing. Physiology photic driving was not seen during photic stimulation.  Hyperventilation was not performed.     ABNORMALITY -Continuous slow, generalized  IMPRESSION: This study is suggestive of moderate diffuse encephalopathy, nonspecific etiology. No seizures or epileptiform discharges were seen throughout the recording.  Law Corsino Barbra Sarks

## 2020-07-24 NOTE — Progress Notes (Signed)
Spoke with pts daughter Janean Eischen and Janae Bridgeman they have requested pt transfer to [facility].  Therefore, spoke with [facility] transfer center to start transfer process.  Marda Stalker, [place] Pager [phone] (please enter 7 digits) PCCM Consult Pager [phone] (please enter 7 digits)

## 2020-07-24 NOTE — ED Notes (Signed)
Pt given meal tray and water at this time

## 2020-07-24 NOTE — Progress Notes (Signed)
PHARMACY CONSULT NOTE - FOLLOW UP  Pharmacy Consult for Electrolyte Monitoring and Replacement   Recent Labs: Potassium (mmol/L)  Date Value  07/24/2020 4.9   Magnesium (mg/dL)  Date Value  02/17/2020 1.8   Calcium (mg/dL)  Date Value  07/24/2020 9.9   Albumin (g/dL)  Date Value  07/24/2020 4.1   Phosphorus (mg/dL)  Date Value  12/30/2019 2.7   Sodium (mmol/L)  Date Value  07/24/2020 131 (L)     Assessment: 59 year old female admitted with acute hemorrhagic infarct. Patient with h/o diabetes, significantly hyperglycemic on arrival. Patient started on insulin drip. Pharmacy to manage electrolytes.  Goal of Therapy:  Electrolytes WNL, K ~ 4 or greater on insulin drip  Plan:  1400 BMP pending. Per RN note, blood had to be redrawn and sent. Will follow up and replace potassium as needed. Will plan to check approximately every 4 to 6 hours depending on trend.  Will check all electrolytes with morning labs.  Tawnya Crook ,PharmD Clinical Pharmacist 07/24/2020 3:50 PM

## 2020-07-24 NOTE — ED Provider Notes (Signed)
-----------------------------------------   7:04 AM on 07/24/2020 -----------------------------------------  Blood pressure 125/60, pulse 74, temperature 98 F (36.7 C), temperature source Oral, resp. rate 20, height 5' 6"  (1.676 m), weight 56.7 kg, SpO2 100 %.  Assuming care from Dr. Quentin Cornwall.  In short, Susan Navarro is a 59 y.o. female with a chief complaint of Altered Mental Status .  Refer to the original H&P for additional details.  The current plan of care is to follow-up repeat CT head to ensure no enlarging hemorrhage.  ----------------------------------------- 8:42 AM on 07/24/2020 -----------------------------------------  Follow-up head CT does show some progression of bleeding but still with no mass-effect or midline shift, appears most consistent with hemorrhagic stroke per radiology.  Case discussed with Dr. Lacinda Axon of neurosurgery and no operative intervention indicated at this time, he recommends discussion with neurology.  Case discussed with Dr. Lorrin Goodell of neurology, who recommends maintaining systolic blood pressure less than 140, platelet transfusion to keep greater than 100, hold aspirin and DVT prophylaxis.  Neurology and neurosurgery agree that patient may be admitted here to [facility].  Patient's current systolic blood pressure is 155 and we will give small dose of labetalol.  Platelets are currently right at 100 and we will hold off on platelet transfusion.  Case discussed with Dr. Mortimer Fries in the ICU for admission.    Blake Divine, MD 07/24/20 [phone]

## 2020-07-24 NOTE — Progress Notes (Signed)
OT Cancellation Note  Patient Details Name: Susan Navarro MRN: [id number] DOB: 04/06/1961   Cancelled Treatment:    Reason Eval/Treat Not Completed: Medical issues which prohibited therapy;Other (comment). Thank you for the OT consult. Order received and chart reviewed. Pt noted with repeat CT head showing questionable progression of hemorrhage. Pt also pending transfer to ICU. Will hold OT evaluation at this time and follow remotely. Will initiate OT services as pt medically appropriate/stable for OT evaluation.   Shara Blazing, M.S., OTR/L Ascom: [phone] 07/24/20, 1:10 PM

## 2020-07-24 NOTE — Procedures (Signed)
Pt going to MRI shortly. Will do EEG after MRI-approx 2pm.

## 2020-07-24 NOTE — Consult Note (Signed)
Referring Physician:  No referring provider defined for this encounter.  Primary Physician:  Rochel Brome, MD  Chief Complaint: Three days of intermittent difficulty with speech, confusion, CT head with c/f left frontal hemorrhage.  History of Present Illness: Susan Navarro is a 59 y.o. female who presents to the [facility] ED after approximately three days of difficulty with word finding, trouble speaking, and confusion.  She reports that her daughter brought her to the ED after she was unable to answer questions.  She had a head CT completed which showed a "new area of low attenuation in the left frontal region with associated sulcal effacement and focal hyperdense material suggesting hemorrhage".   On exam, she denies any headaches, blurry vision, nausea, however while examining her, initially, her speech was clear and she had no difficulty with word finding, however approximately 10 minutes into the examination she did become acutely aphasic and also had some trouble following commands.  This was temporary and lasted only a few minutes.  She reports that this is similar to what has been going on for the last few days and prompted her to come to the ED.  She does have a history of an ischemic stroke which was managed at [facility], with whom she follows up with. She is on ASA 34m daily.   Review of Systems:  A 10 point review of systems is negative, except for the pertinent positives and negatives detailed in the HPI.  Past Medical History: Past Medical History:  Diagnosis Date   Anemia    Chronic kidney disease    Diabetes mellitus without complication [place]    Hypert[place]    [place]   [facility]    Past Surgical History: History reviewed. No pertinent surgical history.  Allergies: Allergies as of 07/24/2020   (No Known Allergies)    Medications:  Current Facility-Administered Medications:    0.9 %  sodium chloride infusion, 250 mL,  Intravenous, PRN, KMortimer Fries Kurian, MD   0.9 %  sodium chloride infusion, , Intravenous, Continuous, Kasa, Kurian, MD   acetaminophen (TYLENOL) tablet 650 mg, 650 mg, Oral, Q4H PRN, KMortimer Fries Kurian, MD   dextrose 5 %-0.45 % sodium chloride infusion, , Intravenous, Continuous, Kasa, Kurian, MD   dextrose 50 % solution 0-50 mL, 0-50 mL, Intravenous, PRN, KMortimer Fries Kurian, MD   docusate sodium (COLACE) capsule 100 mg, 100 mg, Oral, BID PRN, KFlora Lipps MD   famotidine (PEPCID) IVPB 20 mg premix, 20 mg, Intravenous, Q12H, Kasa, Kurian, MD   insulin regular, human (MYXREDLIN) 100 units/ 100 mL infusion, , Intravenous, Continuous, Kasa, Kurian, MD   ondansetron (ZOFRAN) injection 4 mg, 4 mg, Intravenous, Q6H PRN, KMortimer Fries Kurian, MD   polyethylene glycol (MIRALAX / GLYCOLAX) packet 17 g, 17 g, Oral, Daily PRN, KMortimer Fries Kurian, MD   sodium chloride flush (NS) 0.9 % injection 3 mL, 3 mL, Intravenous, Q12H, Kasa, Kurian, MD   sodium chloride flush (NS) 0.9 % injection 3 mL, 3 mL, Intravenous, PRN, KFlora Lipps MD  Current Outpatient Medications:    amLODipine (NORVASC) 5 MG tablet, Take 5 mg by mouth daily., Disp: , Rfl:    aspirin EC 81 MG tablet, Take 81 mg by mouth daily., Disp: , Rfl:    atorvastatin (LIPITOR) 80 MG tablet, Take 80 mg by mouth daily., Disp: , Rfl:    Ensure Max Protein (ENSURE MAX PROTEIN) LIQD, Take 330 mLs (11 oz total) by mouth 2 (two) times daily between meals., Disp: 330 mL,  Rfl: 12   Insulin Glargine (LANTUS SOLOSTAR) 100 UNIT/ML Solostar Pen, Inject 15 Units into the skin daily. (Patient taking differently: Inject 22 Units into the skin daily. ), Disp: 15 mL, Rfl: 0   insulin lispro (HUMALOG KWIKPEN) 100 UNIT/ML KwikPen, Inject 0.02-0.03 mLs (2-3 Units total) into the skin 3 (three) times daily before meals., Disp: 15 mL, Rfl: 0   Insulin Syringe-Needle U-100 (HEALTHWISE INSULIN SYR/NEEDLE) 30G X 5/16" 0.5 ML MISC, 1 each by Does not apply route 3 (three) times daily with  meals., Disp: 50 each, Rfl: 0   lidocaine (LIDODERM) 5 %, Place 1 patch onto the skin daily. Remove & Discard patch within 12 hours or as directed by MD, Disp: 14 patch, Rfl: 0   lisinopril (ZESTRIL) 2.5 MG tablet, Take 2.5 mg by mouth every evening. , Disp: , Rfl:    Multiple Vitamin (MULTIVITAMIN WITH MINERALS) TABS tablet, Take 1 tablet by mouth daily., Disp: 30 tablet, Rfl: 0   Social History: Social History   Tobacco Use   Smoking status: Never Smoker   Smokeless tobacco: Never Used  Scientific laboratory technician Use: Never used  Substance Use Topics   Alcohol use: No   Drug use: Never    Family Medical History: Family History  Problem Relation Age of Onset   Diabetes Mother    Diabetes Brother     Physical Examination: Vitals:   07/24/20 0900 07/24/20 0930  BP: 138/77 (!) 154/76  Pulse: 82 81  Resp: (!) 8 16  Temp:    SpO2: 100% 100%     General: Patient is well developed, well nourished, calm, collected, and in no apparent distress.  Psychiatric: Patient is non-anxious.  Head:  Pupils equal, round, and reactive to light.  Neck:   Supple.  Full range of motion.   NEUROLOGICAL:  General: In no acute distress.   Awake, alert, oriented to person, place, and time.  Able to name 3/3 objects. Able to count backwards from 10 with fluidity. No dysphagia initially on exam, however, approximately 10 minutes into the exam, she had an acute episode of difficulty speaking and then was noted to have some dysphagia. This resolved after a few minutes.   Pupils equal round and reactive to light. EOMI, no nystagmus.  Facial tone is symmetric.  Tongue protrusion is midline.    There is no pronator drift.  Able to follow commands initially however during the time she experienced difficulty speaking she did have some difficulty following commands (when asked to squeeze my hands she had a delayed response).  Strength intact to bilateral upper and lower extremities, no drift  noted.   SILT to extremities bilaterally.    Imaging: CT head 07/24/2020: IMPRESSION: 1. New area of low-attenuation in the left frontal region with associated sulcal effacement and focal hyperdense material suggesting hemorrhage. This likely represents an acute or subacute hemorrhagic infarct. 2. Encephalomalacia in the right frontoparietal region and left parietal region is unchanged since previous study, likely old Infarcts.  Repeat head CT 07/24/2020 at 0700: IMPRESSION: 1. Progressive parenchymal/subarachnoid hemorrhage along the swollen left frontal lobe, hemorrhagic infarct is again favored. Recommend continued follow-up. 2. Remote cerebral and cerebellar infarcts.  CTA Head and neck 07/24/2020: IMPRESSION: 1. Normal CTA of the head and neck. No aneurysm, vascular malformation or large vessel occlusion. 2. Unchanged focus of hemorrhage of the left frontal lobe, better characterized on earlier noncontrast study.   Assessment and Plan: Ms. Outen is a pleasant 59 y.o. female  with intermittent dysphagia and aphasia x 3 days, with radiographic evidence of ICH. CTA of head and neck negative for aneurysmal component.  At this time, there is no acute neurosurgical intervention indicated.   We would recommend management of ICH to Neurology. Agree with holding ASA and blood pressure management.   Please contact us with further questions or concerns, we thank you for allowing [place] to contribute in the care of this patient.  Lonell Face, NP Dept. of Neurosurgery

## 2020-07-24 NOTE — ED Notes (Signed)
Pt transported to CT ?

## 2020-07-24 NOTE — ED Triage Notes (Signed)
Pt arrives [facility] from home w cc of AMS for past few days per family. Pt takes 22 units insulin every morning but has been becoming more confused the past few days despite taking insulin. Vs WDL, BS w EMS >500

## 2020-07-24 NOTE — Progress Notes (Signed)
Spoke with [facility] : [facility] transfer center representative Ulice Dash and ICU Neurologist Dr. Georges Lynch at this time there are no ICU Neurology beds available, therefore transfer request denied.  Dr. Ronalee Red did recommend considering MRI Brain with Contrast.  Dr. Ronalee Red also stated once the pt is downgraded to floor care we could initiate transfer request again.  Updated pts daughter Kashayla Ungerer and niece Janae Bridgeman via telephone all questions were answered.  Will bring the pt to ICU for closer monitoring.    Marda Stalker, [place] Pager [phone] (please enter 7 digits) PCCM Consult Pager [phone] (please enter 7 digits)

## 2020-07-25 ENCOUNTER — Inpatient Hospital Stay: Payer: Medicare HMO

## 2020-07-25 DIAGNOSIS — I609 Nontraumatic subarachnoid hemorrhage, unspecified: Secondary | ICD-10-CM

## 2020-07-25 LAB — ECHOCARDIOGRAM COMPLETE
Area-P 1/2: 2.66 cm2
Height: 66 in
S' Lateral: 2.65 cm
Weight: 2000.01 oz

## 2020-07-25 LAB — CBC
HCT: 28.1 % — ABNORMAL LOW (ref 36.0–46.0)
Hemoglobin: 9.8 g/dL — ABNORMAL LOW (ref 12.0–15.0)
MCH: 31.4 pg (ref 26.0–34.0)
MCHC: 34.9 g/dL (ref 30.0–36.0)
MCV: 90.1 fL (ref 80.0–100.0)
Platelets: 88 10*3/uL — ABNORMAL LOW (ref 150–400)
RBC: 3.12 MIL/uL — ABNORMAL LOW (ref 3.87–5.11)
RDW: 13.2 % (ref 11.5–15.5)
WBC: 3.6 10*3/uL — ABNORMAL LOW (ref 4.0–10.5)
nRBC: 0 % (ref 0.0–0.2)

## 2020-07-25 LAB — BASIC METABOLIC PANEL
Anion gap: 7 (ref 5–15)
BUN: 45 mg/dL — ABNORMAL HIGH (ref 6–20)
CO2: 25 mmol/L (ref 22–32)
Calcium: 8.6 mg/dL — ABNORMAL LOW (ref 8.9–10.3)
Chloride: 109 mmol/L (ref 98–111)
Creatinine, Ser: 1.69 mg/dL — ABNORMAL HIGH (ref 0.44–1.00)
GFR calc Af Amer: 38 mL/min — ABNORMAL LOW (ref >60)
GFR calc non Af Amer: 32 mL/min — ABNORMAL LOW (ref >60)
Glucose, Bld: 173 mg/dL — ABNORMAL HIGH (ref 70–99)
Potassium: 4.4 mmol/L (ref 3.5–5.1)
Sodium: 141 mmol/L (ref 135–145)

## 2020-07-25 LAB — TYPE AND SCREEN
ABO/RH(D): B POS
Antibody Screen: NEGATIVE

## 2020-07-25 LAB — HEMOGLOBIN A1C
Hgb A1c MFr Bld: 9.5 % — ABNORMAL HIGH (ref 4.8–5.6)
Mean Plasma Glucose: 226 mg/dL

## 2020-07-25 LAB — PROTIME-INR
INR: 1 (ref 0.8–1.2)
Prothrombin Time: 12.8 seconds (ref 11.4–15.2)

## 2020-07-25 LAB — ABO/RH: ABO/RH(D): B POS

## 2020-07-25 LAB — MRSA PCR SCREENING: MRSA by PCR: NEGATIVE

## 2020-07-25 LAB — GLUCOSE, CAPILLARY
Glucose-Capillary: 101 mg/dL — ABNORMAL HIGH (ref 70–99)
Glucose-Capillary: 152 mg/dL — ABNORMAL HIGH (ref 70–99)
Glucose-Capillary: 259 mg/dL — ABNORMAL HIGH (ref 70–99)
Glucose-Capillary: 420 mg/dL — ABNORMAL HIGH (ref 70–99)

## 2020-07-25 LAB — PHOSPHORUS: Phosphorus: 2.4 mg/dL — ABNORMAL LOW (ref 2.5–4.6)

## 2020-07-25 LAB — MAGNESIUM: Magnesium: 1.9 mg/dL (ref 1.7–2.4)

## 2020-07-25 IMAGING — CT CT HEAD W/O CM
3 of 4 series · 14 of 47 positions shown, 16 images · non-contrast
Comparison: None.

CLINICAL DATA: Follow-up of intracranial hemorrhage

EXAM:
CT HEAD WITHOUT CONTRAST
TECHNIQUE: Contiguous axial images were obtained from the base of the skull
through the vertex without intravenous contrast.

[Series 3: ax head wo 2 · axial · 0.32mm/px · z∈[-186,-60]mm · 8 of 33 slices shown, 10 images]
[im 3/33  brain]
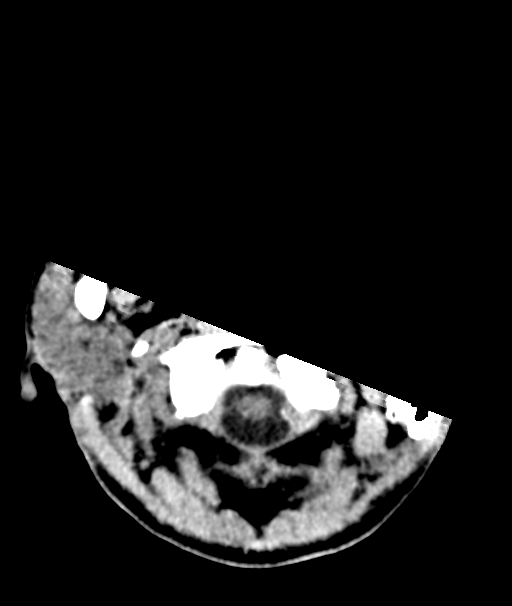
[im 3/33  bone]
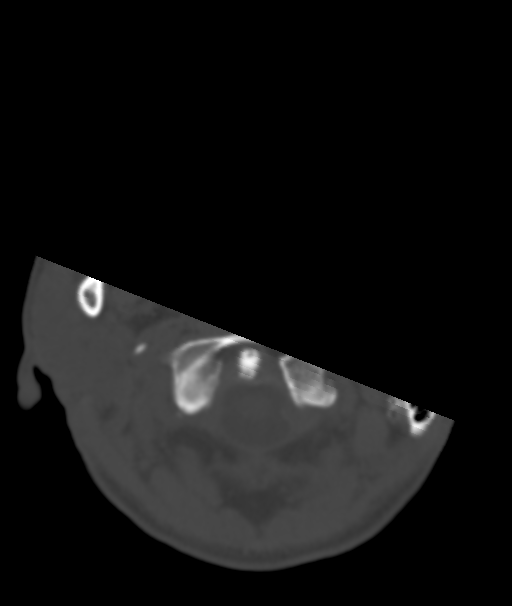
[im 7/33  brain]
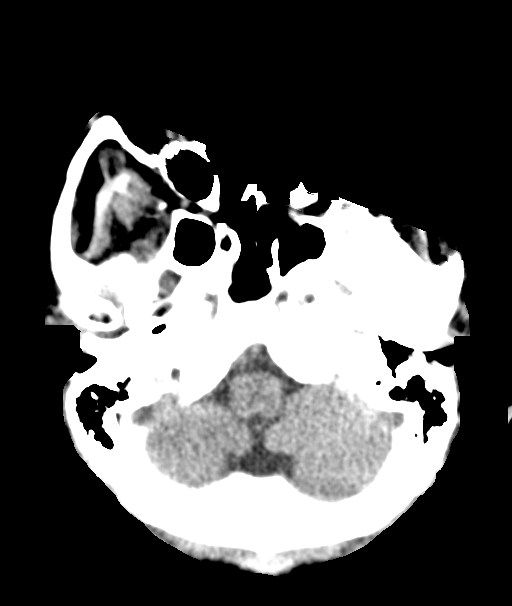
[im 11/33  brain]
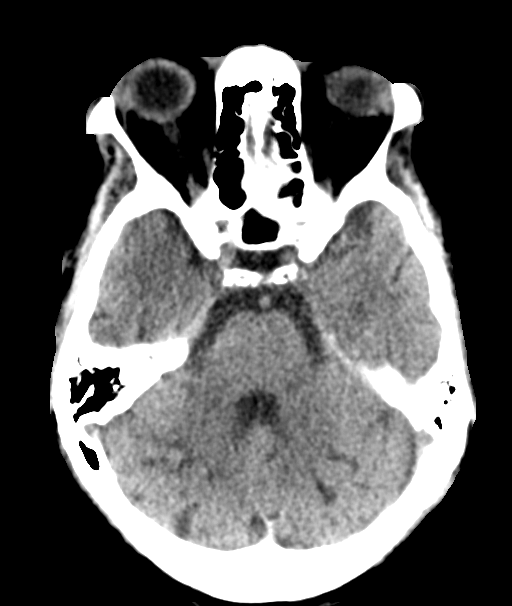
[im 15/33  brain]
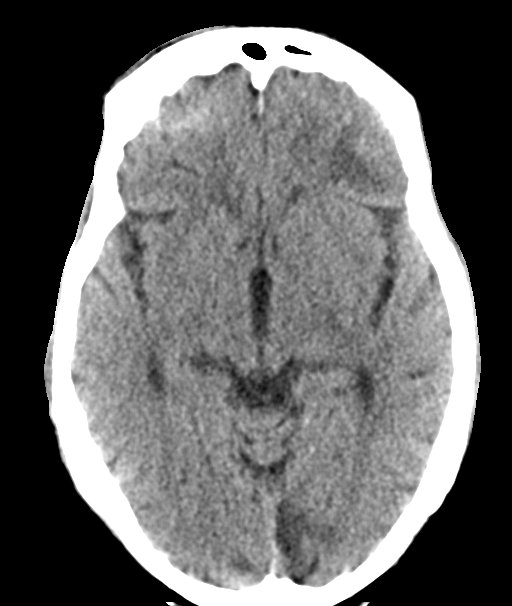
[im 18/33  brain]
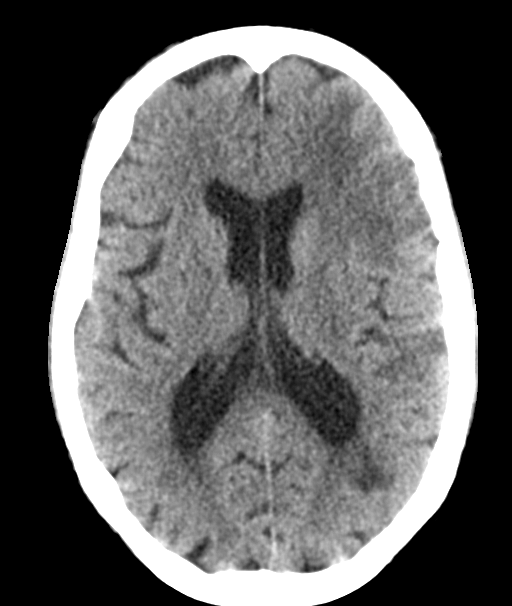
[im 18/33  bone]
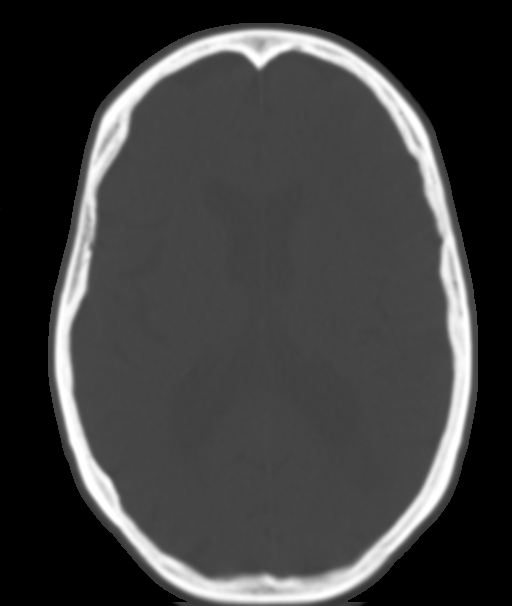
[im 22/33  brain]
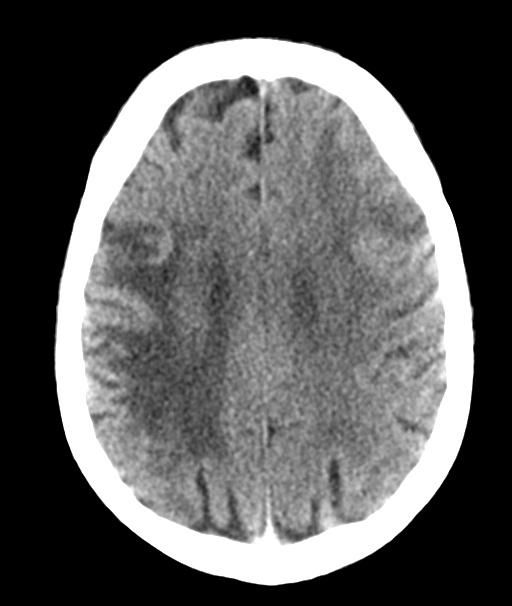
[im 26/33  brain]
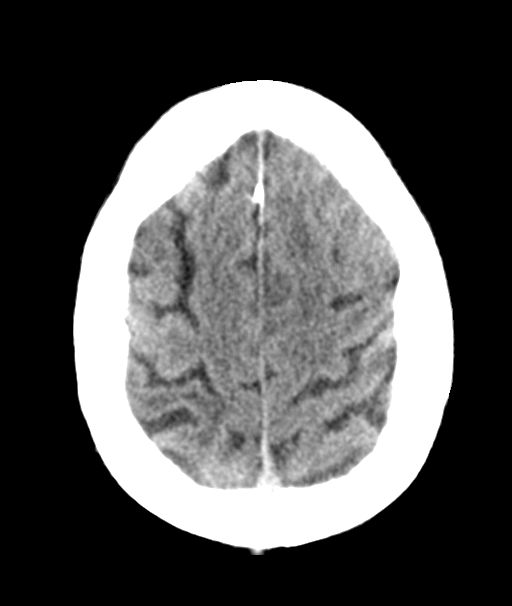
[im 30/33  brain]
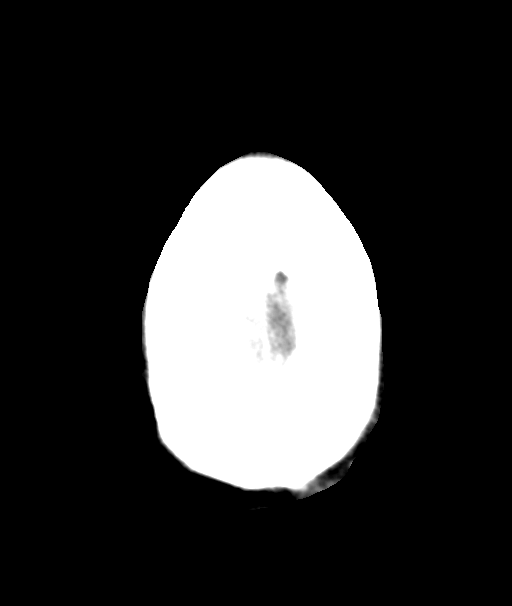

[Series 5: coronal soft tissue · coronal · 0.32mm/px · 3 of 65 slices shown]
[im 22/65  brain]
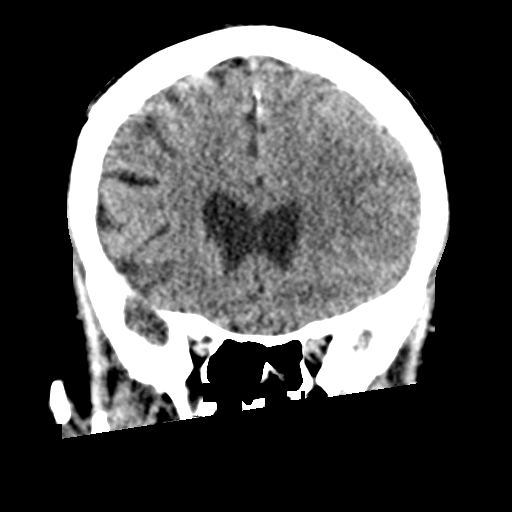
[im 29/65  brain]
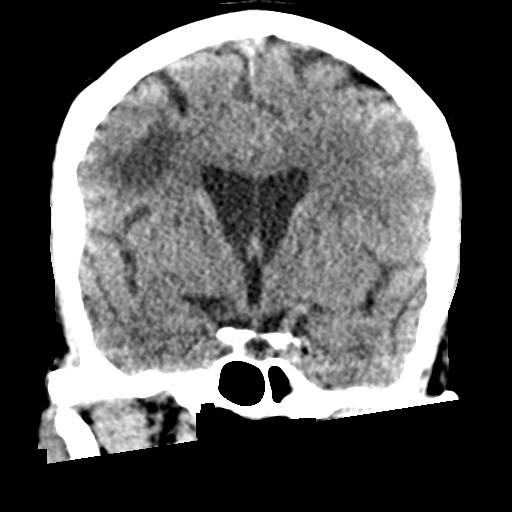
[im 36/65  brain]
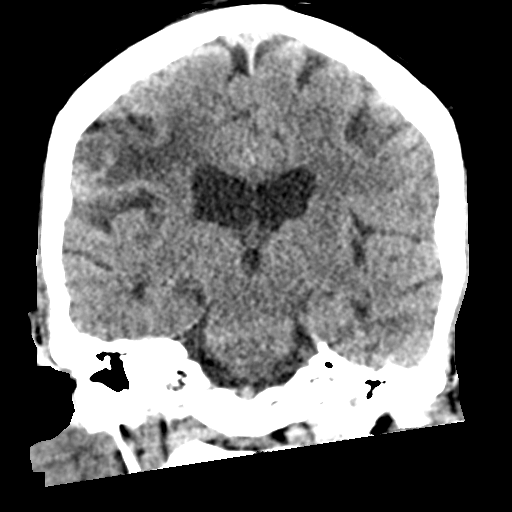

[Series 6: sagittal soft tissue · sagittal · 0.32mm/px · 3 of 55 slices shown]
[im 19/55  brain]
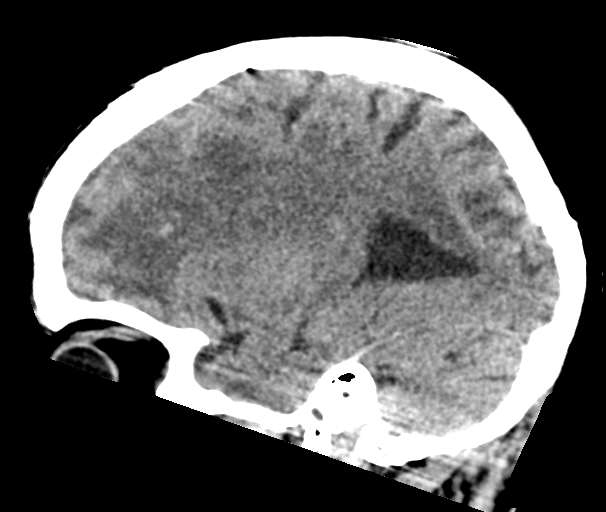
[im 28/55  brain]
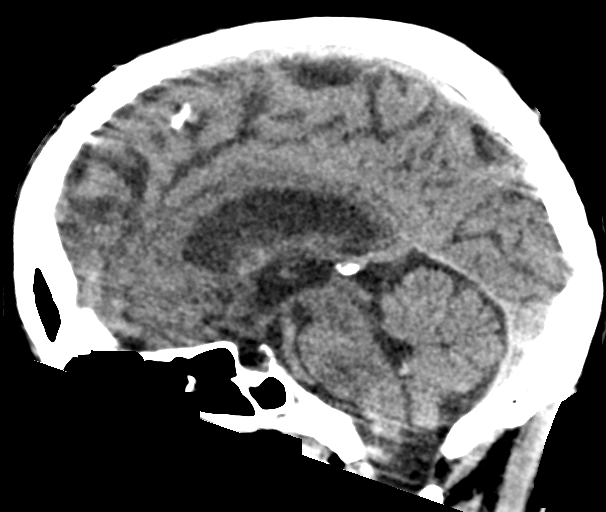
[im 37/55  brain]
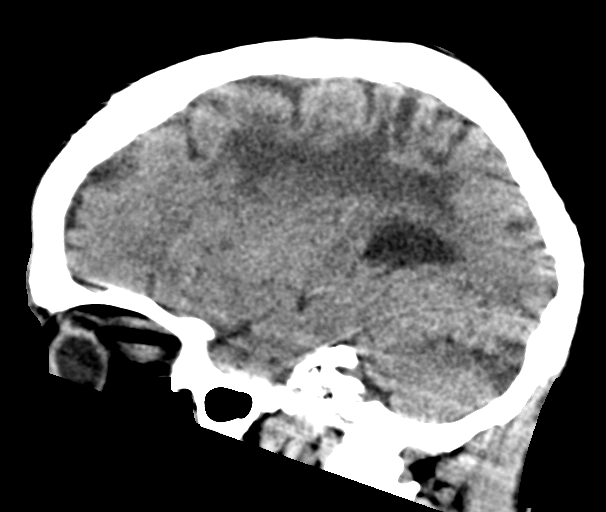

[14 of 47 positions shown; findings below may reference images not displayed]

FINDINGS: Brain: Unchanged small volume subarachnoid hemorrhage over the
anterior left convexity. Parenchymal hypoattenuation here is also
unchanged. The remainder of the brain is also unchanged.

Vascular: Negative

Skull: Negative

Sinuses/Orbits: No acute finding.

Other: None.
IMPRESSION: Unchanged small volume subarachnoid hemorrhage over the anterior
left convexity.

## 2020-07-25 IMAGING — MR MR HEAD W/ CM
4 series · 48 of 48 positions shown · IV contrast (gadavist)
Comparison: [DATE] MRI head.

CLINICAL DATA: Stroke follow-up.

EXAM:
MRI HEAD WITH CONTRAST
TECHNIQUE: Multiplanar, multiecho pulse sequences of the brain and surrounding
structures were obtained with intravenous contrast.
CONTRAST:  7.5mL GADAVIST GADOBUTROL 1 MMOL/ML IV SOLN

[Series 5: T1 · axial · 1.0mm · 0.98mm/px · z∈[-94,+78]mm · 20 of 175 slices shown (1 of 2)]
[im 1/175]
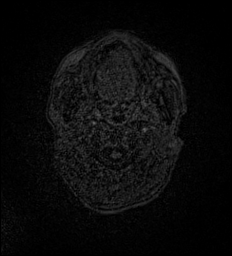
[im 10/175]
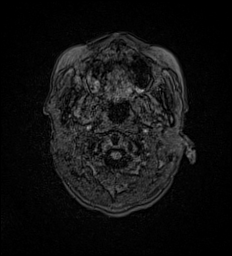
[im 19/175]
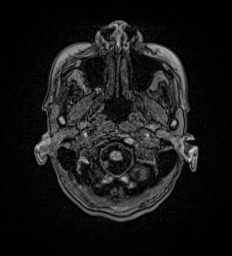
[im 28/175]
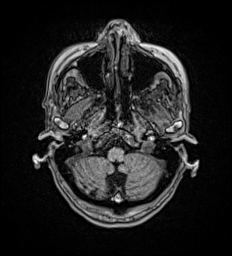
[im 37/175]
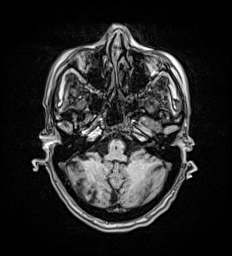
[im 46/175]
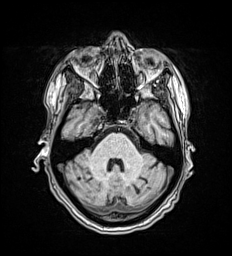
[im 55/175]
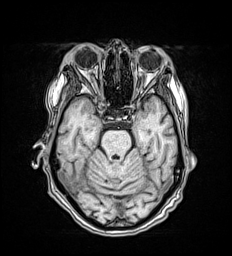
[im 65/175]
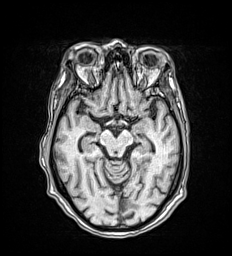
[im 74/175]
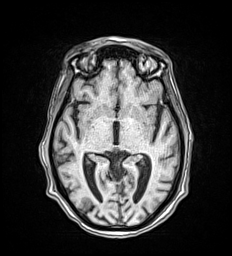
[im 83/175]
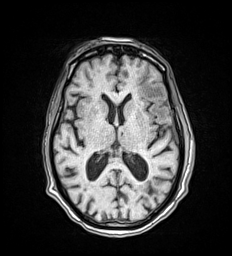
[im 92/175]
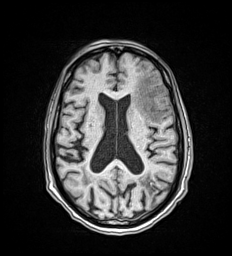
[im 101/175]
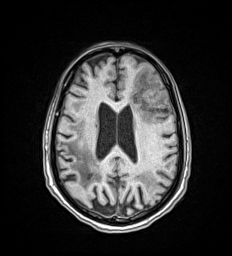
[im 110/175]
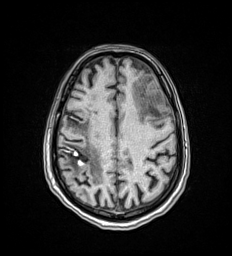
[im 120/175]
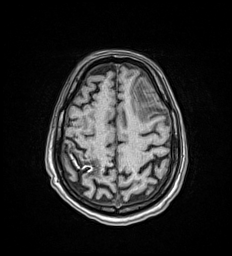
[im 129/175]
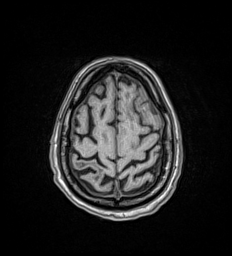
[im 138/175]
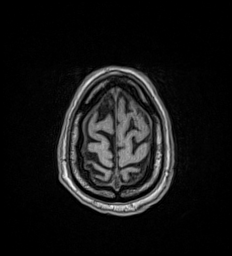
[im 147/175]
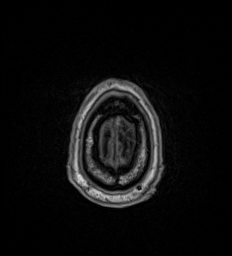
[im 156/175]
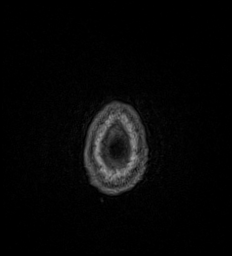
[im 165/175]
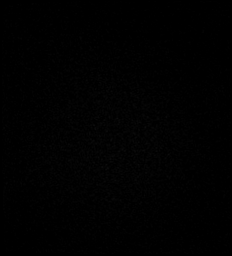
[im 175/175]
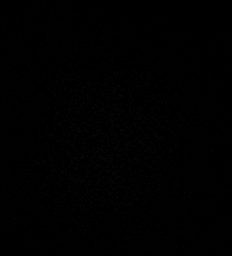

[Series 6: T1 post-contrast · axial · 1.0mm · 0.98mm/px · z∈[-94,+78]mm · 21 of 176 slices shown (1 of 2)]
[im 1/176]
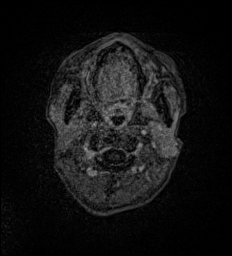
[im 9/176]
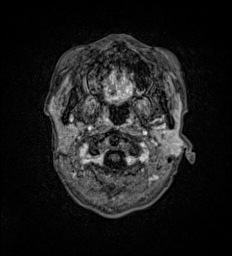
[im 18/176]
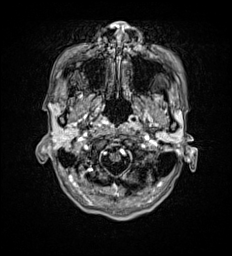
[im 27/176]
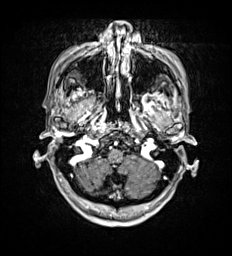
[im 36/176]
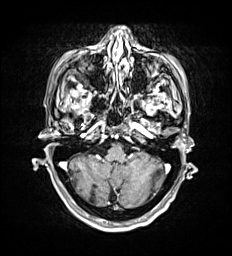
[im 44/176]
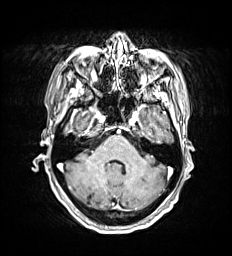
[im 53/176]
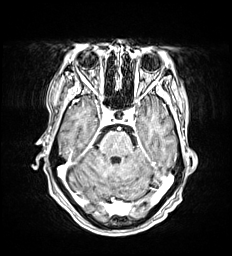
[im 62/176]
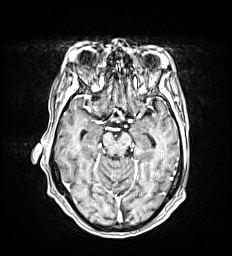
[im 71/176]
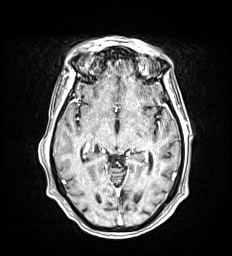
[im 79/176]
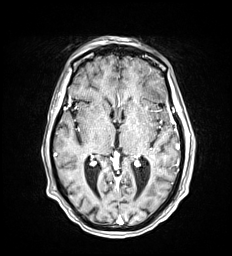
[im 88/176]
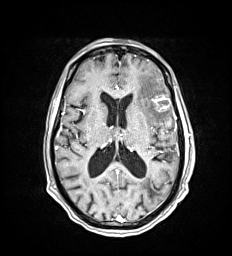
[im 97/176]
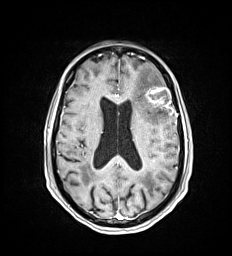
[im 106/176]
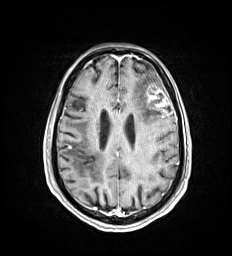
[im 114/176]
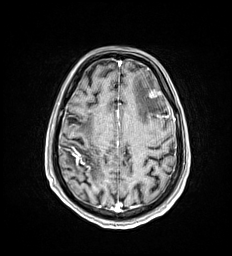
[im 123/176]
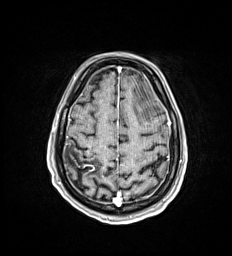
[im 132/176]
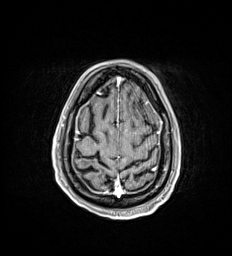
[im 141/176]
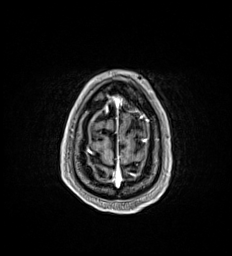
[im 149/176]
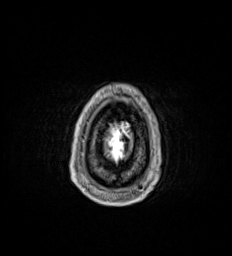
[im 158/176]
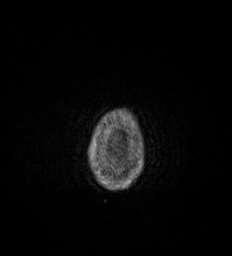
[im 167/176]
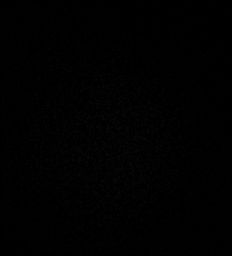
[im 176/176]
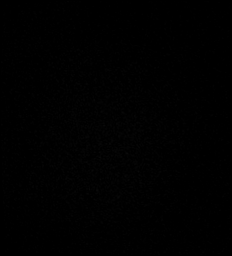

[Series 7: T1 post-contrast · coronal · 5.0mm · 0.90mm/px · 4 of 31 slices shown (2 of 2)]
[im 1/31]
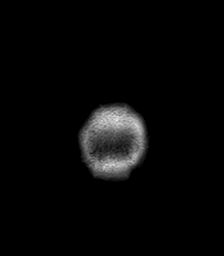
[im 11/31]
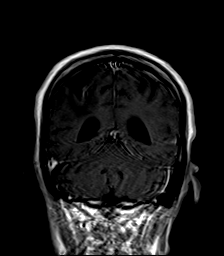
[im 21/31]
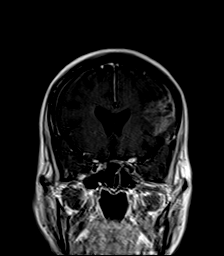
[im 31/31]
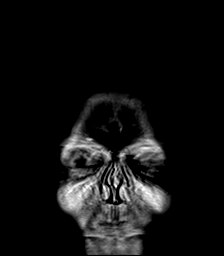

[Series 8: T1 · sagittal · 5.0mm · 0.94mm/px · 3 of 21 slices shown (2 of 2)]
[im 1/21]
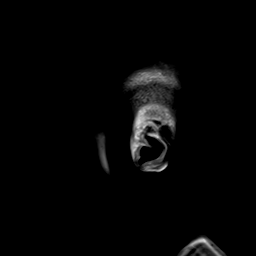
[im 11/21]
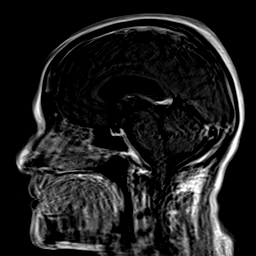
[im 21/21]
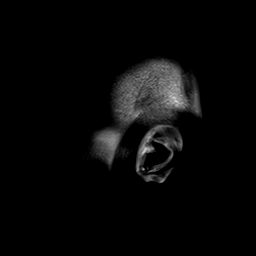

[48 of 48 positions shown; findings below may reference images not displayed]

FINDINGS: Please note that motion artifact limits evaluation.

Brain: Mild diffuse parenchymal volume loss with ex vacuo
dilatation. No midline shift, ventriculomegaly or extra-axial fluid
collection.

Redemonstration of left frontal infarct. Associated subarachnoid
hemorrhage is less conspicuous. Curvilinear left frontal enhancement
and curvilinear right frontal intrinsic T1 hyperintensity reflects
cortical laminar necrosis. There is no masslike enhancement to
suggest a focal lesion.

Sequela of remote insult involving the bilateral frontal, bilateral
parietal and left occipital regions. Smaller remote infarcts are
seen involving the right basal ganglia, left thalamus and bilateral
cerebellum. Background chronic microvascular ischemic changes are
better demonstrated on prior exam.

Vascular: Grossly normal flow voids.

Skull and upper cervical spine: No focal lesion.

Sinuses/Orbits: Normal orbits. Mild left sphenoid sinus disease. No
mastoid effusion.

Other: None.
IMPRESSION: 1. Redemonstration of left frontal infarct with cortical laminar
necrosis. No masslike enhancement to suggest focal lesion.
2. Sequela of remote bilateral cerebral, bilateral cerebellar, right
basal ganglia and left thalamic infarcts. Right frontal cortical
laminar necrosis.
3. Chronic microvascular ischemic changes.  Mild cerebral atrophy.

## 2020-07-25 MED ORDER — CHLORHEXIDINE GLUCONATE CLOTH 2 % EX PADS
6.0000 | MEDICATED_PAD | Freq: Every day | CUTANEOUS | Status: DC
  Administered 2020-07-25 – 2020-07-27 (×3): 6 via TOPICAL

## 2020-07-25 MED ORDER — INSULIN ASPART 100 UNIT/ML ~~LOC~~ SOLN
0.0000 [IU] | Freq: Every day | SUBCUTANEOUS | Status: DC
  Administered 2020-07-25: 3 [IU] via SUBCUTANEOUS
  Administered 2020-07-26: 5 [IU] via SUBCUTANEOUS
  Administered 2020-07-27: 22:00:00 2 [IU] via SUBCUTANEOUS
  Administered 2020-07-28: 5 [IU] via SUBCUTANEOUS
  Filled 2020-07-25 (×4): qty 1

## 2020-07-25 MED ORDER — INSULIN ASPART 100 UNIT/ML ~~LOC~~ SOLN
0.0000 [IU] | Freq: Three times a day (TID) | SUBCUTANEOUS | Status: DC
  Administered 2020-07-25: 9 [IU] via SUBCUTANEOUS
  Administered 2020-07-26: 7 [IU] via SUBCUTANEOUS
  Administered 2020-07-26: 9 [IU] via SUBCUTANEOUS
  Administered 2020-07-27: 5 [IU] via SUBCUTANEOUS
  Administered 2020-07-27 (×2): 2 [IU] via SUBCUTANEOUS
  Administered 2020-07-28: 3 [IU] via SUBCUTANEOUS
  Administered 2020-07-28 – 2020-07-29 (×2): 9 [IU] via SUBCUTANEOUS
  Filled 2020-07-25 (×10): qty 1

## 2020-07-25 MED ORDER — FAMOTIDINE IN NACL 20-0.9 MG/50ML-% IV SOLN: 20.0000 mg | INTRAVENOUS | Status: DC

## 2020-07-25 MED ORDER — SODIUM CHLORIDE 0.9% IV SOLUTION: Freq: Once | INTRAVENOUS | Status: AC

## 2020-07-25 MED ORDER — GADOBUTROL 1 MMOL/ML IV SOLN
7.5000 mL | Freq: Once | INTRAVENOUS | Status: AC | PRN
  Administered 2020-07-25: 7.5 mL via INTRAVENOUS

## 2020-07-25 NOTE — Progress Notes (Signed)
PHARMACY CONSULT NOTE - FOLLOW UP  Pharmacy Consult for Electrolyte Monitoring and Replacement   Recent Labs: Potassium (mmol/L)  Date Value  07/25/2020 4.4   Magnesium (mg/dL)  Date Value  07/25/2020 1.9   Calcium (mg/dL)  Date Value  07/25/2020 8.6 (L)   Albumin (g/dL)  Date Value  07/24/2020 4.1   Phosphorus (mg/dL)  Date Value  07/25/2020 2.4 (L)   Sodium (mmol/L)  Date Value  07/25/2020 141   Assessment: 59 year old female admitted with acute hemorrhagic infarct. Patient with h/o diabetes, significantly hyperglycemic on arrival. Patient started on insulin drip. Pharmacy to manage electrolytes.   Goal of Therapy:  Electrolytes WNL, K ~ 4 or greater on insulin drip  Plan:  Patient transitioned off insulin drip. SQ insulin orders then discontinued. Patient is eating, although decreased intake. Will add SSI for now. Phos slightly low but expect it should improve with increased intake. Will continue to monitor. All electrolytes with morning labs.  Tawnya Crook, PharmD 07/25/2020 12:24 PM

## 2020-07-25 NOTE — Progress Notes (Signed)
PT Cancellation Note  Patient Details Name: Susan Navarro MRN: [id number] DOB: [birth year]   Cancelled Treatment:    Reason Eval/Treat Not Completed: Other (comment). Consult received, chart reviewed. Strict bed rest orders in place, RN notified and RN awaiting MD rounding prior to removal of bed rest orders. PT to follow up as able.  Lieutenant Diego PT, DPT 9:26 AM,07/25/20

## 2020-07-25 NOTE — Progress Notes (Signed)
Neurology Progress Note  IDENTIFYING INFORMATION  Susan Navarro MR# [id number] 07/25/2020  HISTORY OF PRESENT ILLNESS  Susan Navarro is a 59 y.o. female  has a past medical history of Anemia, Chronic kidney disease, Diabetes mellitus without complication ([place]), Hypertension, and Stroke ([place]) (03/2018) who presents with episode of confusion and starring off. 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  INTERVAL HISTORY  No significant events overnight. She is alert, awake and conversant. No episodes of starring off. Repeat CTH with stable hemorrhage. rEEG with no epileptiform discharges.   MEDICATIONS    Current Facility-Administered Medications (Endocrine & Metabolic):  .  insulin aspart (novoLOG) injection 0-5 Units .  insulin aspart (novoLOG) injection 0-9 Units   Current Facility-Administered Medications (Cardiovascular):  .  [COMPLETED] labetalol (NORMODYNE) injection 20 mg **AND** nicardipine (CARDENE) 28m in 0.86% saline 2098mIV infusion (0.1 mg/ml)       Current Facility-Administered Medications (Analgesics):  .  acetaminophen (TYLENOL) tablet 650 mg     Current Facility-Administered Medications (Other):  .   stroke: mapping our early stages of recovery book .  0.9 %  sodium chloride infusion .  Chlorhexidine Gluconate Cloth 2 % PADS 6 each .  docusate sodium (COLACE) capsule 100 mg .  famotidine (PEPCID) IVPB 20 mg premix .  gadobutrol (GADAVIST) 1 MMOL/ML injection 7.5 mL .  levETIRAcetam (KEPPRA) tablet 500 mg .  ondansetron (ZOFRAN) injection 4 mg .  polyethylene glycol (MIRALAX / GLYCOLAX) packet 17 g .  sodium chloride flush (NS) 0.9 % injection 3 mL .  sodium chloride flush (NS) 0.9 % injection 3 mL  No current outpatient medications on file.  VITAL SIGNS  Temp:  [97.6 F (36.4 C)-97.7 F (36.5 C)] 97.7 F (36.5 C) (08/04 0400) Pulse Rate:  [77-87] 84 (08/04 0500) Resp:  [5-31] 19 (08/04 0600) BP: (97-150)/(47-82) 114/57 (08/04 0600) SpO2:  [96  %-100 %] 97 % (08/04 0600) Weight:  [60.1 kg] 60.1 kg (08/04 0500)  PHYSICAL EXAM   General Physical Exam  General: NAD, lying comfortably in bed HENT: Normal oropharynx and mucosa. Normal external appearance of ears and nose. CV/Chest: Regular rate and rhythm, normal S1S2 Lungs: No audible wheezing. Normal work of breathing. No accessory muscle use Abdomen: Non distended, non tender Extremities: Warm and well perfused. No appreciable edema, cyanosis or deformity. Skin: No rash. Normal palpation of skin.   Musculoskeletal: No joint tenderness. Normal digits and nails by inspection. No clubbing.  Neurologic Examination  Mental status/Cognition: alert; oriented to self but not to month; poor attention; no apparent neglect Speech/language: fluent; comprehension intact; object naming intact inermittently, repetition intact. Cranial nerves:   CN II Visual fields full to confrontation   CN III,IV,VI PERRL. EOMI.   CN V Facial sensation intact to light touch bilaterally   CN VII Face, Smile, Eyebrow raise/closure symmetric.   CN VIII Hearing grossly intact to voice   CN IX & X Soft palate elevates symmetrically in the midline, no dysarthria   CN XI Shoulder shrug with full strength   CN XII Tongue protrudes midline    Motor: Normal bulk and tone. All extremities antigravity without drift  Sensation: intact to light touch throughout without extinction  Coordination/Complex Motor:  - Finger-to-nose with ataxia, tremors and dysmetria - Heel-to-shin intact bilaterally without dysmetria - Rapid alternating movements are slowed precluding assessment of dysdiadochokinesia.   IMAGING/DIAGNOSTIC STUDIES  MRI BRrain without contrast: IMPRESSION: Motion degraded exam.  Moderate-sized region of predominantly vasogenic edema within the anterolateral left frontal lobe.  Within this region, and abutting the cortical surface, there is a 1.7 cm circumscribed focus of T2 intermediate signal.  There is no appreciable restricted diffusion at this site and there is unchanged overlying small-volume subarachnoid hemorrhage. Findings are suspicious for a possible underlying mass and post-contrast MR imaging of the brain is recommended for further evaluation. Alternatively, these findings may reflect a late subacute infarct with hemorrhagic conversion or parenchymal hematoma with surrounding edema.  Multifocal chronic cortically based infarcts within the bilateral cerebral hemispheres as described. Chronic lacunar infarcts within the right basal ganglia, left thalamus and bilateral cerebellar hemispheres.  Background mild chronic small vessel ischemic disease within the cerebral Brinley matter and pons.  Mild generalized parenchymal atrophy.  Left sphenoid sinusitis.   MRI Brain with contrast: 1. Redemonstration of left frontal infarct with cortical laminar necrosis. No masslike enhancement to suggest focal lesion. 2. Sequela of remote bilateral cerebral, bilateral cerebellar, right basal ganglia and left thalamic infarcts. Right frontal cortical laminar necrosis. 3. Chronic microvascular ischemic changes.  Mild cerebral atrophy.  REEG: This study is suggestive of moderate diffuse encephalopathy, nonspecific etiology. No seizures or epileptiform discharges were seen throughout the recording.   Lab Results  Component Value Date   HGBA1C 9.5 (H) 07/24/2020   Lab Results  Component Value Date   LDLCALC 19 07/24/2020     ASSESSMENT AND PLAN  Impression and plan: Susan Navarro is a 59 y.o. female with PMH significant for poorly controlled diabetes, hypertension, hyperlipidemia, prior strokes which patient is not aware of who presents with confusion over the last few days and episodes of staring off.  CT head in the ED concerning for a left frontal infarct with some hemorrhagic transformation.  Repeat CT head shows some questionable progression of the hemorrhage.  Neuro  exam with mild aphasia out of proportion to encephalopathy but no other focal deficit. She was also noted to have high blood glucose level which was elevated to 474 at presentation.  Impression: Left frontal ischemic infarct with hemorrhagic transformation Suspect focal seizures with retained awareness.  Plan: - Can liberalize SBP goal to < 160. - I ordered SCDs, hold off on Aspirin and DVT prophylaxis given platelet count is below 88. - I ordered platelet transfusion to keep platelet above 100. Discussed with daughter over phone and she is okay with that. - Repeat CTH in 24 hours was completed and shows- stable ICH. - NIHSS per protocol - Platelets>100K, INR<1.4 - Brain imaging- MRI Brain w/o C with vasogenic edema and 1.7cm circumscribed T2 intermediate signal abnormality. MRI with contrast with no mass like lesion, no enhancement. - Vascular imaging- CTA Brain/Neck with no aneurysm, no LVO. - TTE - EF of 60-65% - Lipid panel with LDL of 19 - Statin - Continue home Atorvastatin 51m daily. - A1C - 9.5 - Telemetry monitoring for arrythmia- ordered - Swallow screen - ordered - Stroke education - ordered - PT/OT/SLP consulted.  Suspected partial seizures with retained awareness: Daughter endorses patient having episodes of starring off lasting about a minute over the last couple of days. Given new ICH, suspect this could be seizures. My clinical suspicion is high enough that I started her on anti seizure medication and will continue that for now. - Started on Keppra [id number] - routine EEG - completed with no epileptogenic abnormalities. - Seizure precautions  [facility] Signed,  SaDaltonager Number [phone]

## 2020-07-25 NOTE — Progress Notes (Signed)
OT Cancellation Note  Patient Details Name: Susan Navarro MRN: [id number] DOB: 04-14-1961   Cancelled Treatment:    Reason Eval/Treat Not Completed: Active bedrest order. Thank you for the OT consult. Order received and chart reviewed. Pt noted with active bed rest order. Will hold OT evaluation at this time and continue to follow remotely. Will initiate OT services as available and pt medically appropriate for OT evaluation.   Shara Blazing, M.S., OTR/L Ascom: [phone] 07/25/20, 8:40 AM

## 2020-07-25 NOTE — Progress Notes (Signed)
CHIEF COMPLAINT:   Chief Complaint  Patient presents with  . Altered Mental Status    Subjective follow up CVA No acute issues MRI reviewed with Neurology and Neurosurgery C/w CVA Alert and awake following commands    Review of Systems:  Gen:  Denies  fever, sweats, chills weight loss  HEENT: Denies blurred vision, double vision, ear pain, eye pain, hearing loss, nose bleeds, sore throat Cardiac:  No dizziness, chest pain or heaviness, chest tightness,edema, No JVD Resp:   No cough, -sputum production, -shortness of breath,-wheezing, -hemoptysis,  Other:  All other systems negative      Objective   Examination:  General exam: Appears calm and comfortable  Respiratory system: Clear to auscultation. Respiratory effort normal. HEENT: /AT, PERRLA, no thrush, no stridor. Cardiovascular system: S1 & S2 heard, RRR. No JVD, murmurs, rubs, gallops or clicks. No pedal edema. Gastrointestinal system: Abdomen is nondistended, soft and nontender. No organomegaly or masses felt. Normal bowel sounds heard. Central nervous system: Alert and oriented. No focal neurological deficits. Extremities: Symmetric 5 x 5 power. Skin: No rashes, lesions or ulcers Psychiatry: Judgement and insight appear normal. Mood & affect appropriate.   VITALS:  height is 5' 6"  (1.676 m) and weight is 60.1 kg. Her oral temperature is 97.7 F (36.5 C). Her blood pressure is 114/57 (abnormal) and her pulse is 84. Her respiration is 19 and oxygen saturation is 97%.   I personally reviewed Labs under Results section.  Radiology Reports EEG  Result Date: 07/24/2020 Lora Havens, MD     07/24/2020  4:26 PM Patient Name: Susan Navarro MRN: [id number] Epilepsy Attending: Lora Havens Referring Physician/Provider: Dr Donnetta Simpers Date: 07/24/2020 Duration: 22.46 mins Patient history: 59yo F presented with confusion, staring off. EEG to evaluate for seizure Level of alertness: awake AEDs during EEG study:  LEV Technical aspects: This EEG study was done with scalp electrodes positioned according to the 10-20 International system of electrode placement. Electrical activity was acquired at a sampling rate of 500Hz  and reviewed with a high frequency filter of 70Hz  and a low frequency filter of 1Hz . EEG data were recorded continuously and digitally stored. Description: No posterior dominant rhythm was seen. EEG showed continuous generalized 3 to 6 Hz theta-delta slowing. Physiology photic driving was not seen during photic stimulation.  Hyperventilation was not performed.   ABNORMALITY -Continuous slow, generalized IMPRESSION: This study is suggestive of moderate diffuse encephalopathy, nonspecific etiology. No seizures or epileptiform discharges were seen throughout the recording. Lora Havens   CT Angio Head W or Wo Contrast  Result Date: 07/24/2020 CLINICAL DATA:  Encephalopathy with intracranial hemorrhage EXAM: CT ANGIOGRAPHY HEAD AND NECK TECHNIQUE: Multidetector CT imaging of the head and neck was performed using the standard protocol during bolus administration of intravenous contrast. Multiplanar CT image reconstructions and MIPs were obtained to evaluate the vascular anatomy. Carotid stenosis measurements (when applicable) are obtained utilizing NASCET criteria, using the distal internal carotid diameter as the denominator. CONTRAST:  70m OMNIPAQUE IOHEXOL 350 MG/ML SOLN COMPARISON:  None. FINDINGS: CTA NECK FINDINGS SKELETON: There is no bony spinal canal stenosis. No lytic or blastic lesion. OTHER NECK: Normal pharynx, larynx and major salivary glands. No cervical lymphadenopathy. Unremarkable thyroid gland. UPPER CHEST: No pneumothorax or pleural effusion. No nodules or masses. AORTIC ARCH: There is no calcific atherosclerosis of the aortic arch. There is no aneurysm, dissection or hemodynamically significant stenosis of the visualized portion of the aorta. Conventional 3 vessel aortic branching  pattern. The  visualized proximal subclavian arteries are widely patent. RIGHT CAROTID SYSTEM: Normal without aneurysm, dissection or stenosis. LEFT CAROTID SYSTEM: Normal without aneurysm, dissection or stenosis. VERTEBRAL ARTERIES: Left dominant configuration. Both origins are clearly patent. There is no dissection, occlusion or flow-limiting stenosis to the skull base (V1-V3 segments). CTA HEAD FINDINGS POSTERIOR CIRCULATION: --Vertebral arteries: Normal V4 segments. --Inferior cerebellar arteries: Normal. --Basilar artery: Normal. --Superior cerebellar arteries: Normal. --Posterior cerebral arteries (PCA): Normal. ANTERIOR CIRCULATION: --Intracranial internal carotid arteries: Normal. --Anterior cerebral arteries (ACA): Normal. Both A1 segments are present. Patent anterior communicating artery (a-comm). --Middle cerebral arteries (MCA): Normal. VENOUS SINUSES: As permitted by contrast timing, patent. ANATOMIC VARIANTS: None Review of the MIP images confirms the above findings. IMPRESSION: 1. Normal CTA of the head and neck. No aneurysm, vascular malformation or large vessel occlusion. 2. Unchanged focus of hemorrhage of the left frontal lobe, better characterized on earlier noncontrast study. Electronically Signed   By: Ulyses Jarred M.D.   On: 07/24/2020 03:16   CT HEAD WO CONTRAST  Result Date: 07/25/2020 CLINICAL DATA:  Follow-up of intracranial hemorrhage EXAM: CT HEAD WITHOUT CONTRAST TECHNIQUE: Contiguous axial images were obtained from the base of the skull through the vertex without intravenous contrast. COMPARISON:  None. FINDINGS: Brain: Unchanged small volume subarachnoid hemorrhage over the anterior left convexity. Parenchymal hypoattenuation here is also unchanged. The remainder of the brain is also unchanged. Vascular: Negative Skull: Negative Sinuses/Orbits: No acute finding. Other: None. IMPRESSION: Unchanged small volume subarachnoid hemorrhage over the anterior left convexity. Electronically  Signed   By: Ulyses Jarred M.D.   On: 07/25/2020 03:46   CT Head Wo Contrast  Result Date: 07/24/2020 CLINICAL DATA:  Follow-up intracranial hemorrhage EXAM: CT HEAD WITHOUT CONTRAST TECHNIQUE: Contiguous axial images were obtained from the base of the skull through the vertex without intravenous contrast. COMPARISON:  Earlier today FINDINGS: Brain: Edematous appearance and swelling with cortical/subarachnoid hemorrhage in the anterior and lateral left frontal lobe. The degree of hemorrhage appears increased without discrete hematoma. No progressive swelling and no midline shift. Remote high right frontal, left posterior frontal, and left occipital cortically based infarcts. Small remote right more than left cerebellar infarcts. No hydrocephalus. Vascular: No hyperdense vessel. Skull: Negative Sinuses/Orbits: Bilateral cataract resection IMPRESSION: 1. Progressive parenchymal/subarachnoid hemorrhage along the swollen left frontal lobe, hemorrhagic infarct is again favored. Recommend continued follow-up. 2. Remote cerebral and cerebellar infarcts. Electronically Signed   By: Monte Fantasia M.D.   On: 07/24/2020 07:39   CT Head Wo Contrast  Result Date: 07/24/2020 CLINICAL DATA:  Mental status change of unknown cause. Altered mental status for a few days. EXAM: CT HEAD WITHOUT CONTRAST TECHNIQUE: Contiguous axial images were obtained from the base of the skull through the vertex without intravenous contrast. COMPARISON:  CT head 12/25/2019.  MRI brain 12/25/2019. FINDINGS: Brain: Encephalomalacia in the right frontoparietal region and left parietal region is unchanged since previous study. New area of low-attenuation in the left frontal region with associated sulcal effacement and focal hyperdense material suggesting hemorrhage. This likely represents an acute or subacute hemorrhagic infarct. No significant midline shift. No abnormal extra-axial collections. Vascular: No hyperdense vessel or unexpected  calcification. Skull: Normal. Negative for fracture or focal lesion. Sinuses/Orbits: No acute finding. Other: None. IMPRESSION: 1. New area of low-attenuation in the left frontal region with associated sulcal effacement and focal hyperdense material suggesting hemorrhage. This likely represents an acute or subacute hemorrhagic infarct. 2. Encephalomalacia in the right frontoparietal region and left parietal region is unchanged since previous  study, likely old infarcts. Critical Value/emergent results were called by telephone at the time of interpretation on 07/24/2020 at 1:36 am to provider Merlyn Lot , who verbally acknowledged these results. Electronically Signed   By: Lucienne Capers M.D.   On: 07/24/2020 01:39   CT Angio Neck W and/or Wo Contrast  Result Date: 07/24/2020 CLINICAL DATA:  Encephalopathy with intracranial hemorrhage EXAM: CT ANGIOGRAPHY HEAD AND NECK TECHNIQUE: Multidetector CT imaging of the head and neck was performed using the standard protocol during bolus administration of intravenous contrast. Multiplanar CT image reconstructions and MIPs were obtained to evaluate the vascular anatomy. Carotid stenosis measurements (when applicable) are obtained utilizing NASCET criteria, using the distal internal carotid diameter as the denominator. CONTRAST:  79m OMNIPAQUE IOHEXOL 350 MG/ML SOLN COMPARISON:  None. FINDINGS: CTA NECK FINDINGS SKELETON: There is no bony spinal canal stenosis. No lytic or blastic lesion. OTHER NECK: Normal pharynx, larynx and major salivary glands. No cervical lymphadenopathy. Unremarkable thyroid gland. UPPER CHEST: No pneumothorax or pleural effusion. No nodules or masses. AORTIC ARCH: There is no calcific atherosclerosis of the aortic arch. There is no aneurysm, dissection or hemodynamically significant stenosis of the visualized portion of the aorta. Conventional 3 vessel aortic branching pattern. The visualized proximal subclavian arteries are widely patent.  RIGHT CAROTID SYSTEM: Normal without aneurysm, dissection or stenosis. LEFT CAROTID SYSTEM: Normal without aneurysm, dissection or stenosis. VERTEBRAL ARTERIES: Left dominant configuration. Both origins are clearly patent. There is no dissection, occlusion or flow-limiting stenosis to the skull base (V1-V3 segments). CTA HEAD FINDINGS POSTERIOR CIRCULATION: --Vertebral arteries: Normal V4 segments. --Inferior cerebellar arteries: Normal. --Basilar artery: Normal. --Superior cerebellar arteries: Normal. --Posterior cerebral arteries (PCA): Normal. ANTERIOR CIRCULATION: --Intracranial internal carotid arteries: Normal. --Anterior cerebral arteries (ACA): Normal. Both A1 segments are present. Patent anterior communicating artery (a-comm). --Middle cerebral arteries (MCA): Normal. VENOUS SINUSES: As permitted by contrast timing, patent. ANATOMIC VARIANTS: None Review of the MIP images confirms the above findings. IMPRESSION: 1. Normal CTA of the head and neck. No aneurysm, vascular malformation or large vessel occlusion. 2. Unchanged focus of hemorrhage of the left frontal lobe, better characterized on earlier noncontrast study. Electronically Signed   By: KUlyses JarredM.D.   On: 07/24/2020 03:16   MR BRAIN WO CONTRAST  Result Date: 07/24/2020 CLINICAL DATA:  Neuro deficit, acute, stroke suspected. Additional provided: 59year-old female presenting with episode of confusion, recent fall. EXAM: MRI HEAD WITHOUT CONTRAST TECHNIQUE: Multiplanar, multiecho pulse sequences of the brain and surrounding structures were obtained without intravenous contrast. COMPARISON:  Head CT performed earlier the same day 07/24/2020 CT angiogram head 07/24/2020, brain MRI 12/25/2019. FINDINGS: Brain: Intermittently motion degraded examination. Most notably, there is mild-to-moderate motion degradation of the axial T2/FLAIR sequence and SWI sequence as well as axial T1 weighted sequence. There is moderate predominantly vasogenic edema  within the anterolateral left frontal lobe. Within this region and abutting the cortical surface, there is a rounded 1.7 cm T2 intermediate signal (series 10, image 17). There is no definite restricted diffusion at this site. Unchanged overlying small volume subarachnoid hemorrhage. Stable background mild generalized parenchymal atrophy. Redemonstrated remote cortically based infarcts within the posterior right frontal lobe and right frontal operculum, posterior left frontal lobe, bilateral parietal lobes and left occipital lobe. Associated chronic blood products at site of the chronic infarcts within the posterior right frontal lobe and right frontal operculum as well as left parietal lobe. Additional scattered supratentorial chronic microhemorrhages. Chronic lacunar infarcts within the right lentiform nucleus and left thalamus.  Numerous small chronic infarcts within the cerebellar hemispheres bilaterally. Background mild chronic small vessel ischemic changes within the cerebral Mcwilliams matter and pons. There is no acute infarct. No midline shift or hydrocephalus. Vascular: Expected proximal arterial flow voids. Skull and upper cervical spine: No focal marrow lesion. Sinuses/Orbits: Visualized orbits show no acute finding. Frothy secretions and mild mucosal thickening within the left sphenoid sinus. No significant mastoid effusion. IMPRESSION: Motion degraded exam. Moderate-sized region of predominantly vasogenic edema within the anterolateral left frontal lobe. Within this region, and abutting the cortical surface, there is a 1.7 cm circumscribed focus of T2 intermediate signal. There is no appreciable restricted diffusion at this site and there is unchanged overlying small-volume subarachnoid hemorrhage. Findings are suspicious for a possible underlying mass and post-contrast MR imaging of the brain is recommended for further evaluation. Alternatively, these findings may reflect a late subacute infarct with  hemorrhagic conversion or parenchymal hematoma with surrounding edema. Multifocal chronic cortically based infarcts within the bilateral cerebral hemispheres as described. Chronic lacunar infarcts within the right basal ganglia, left thalamus and bilateral cerebellar hemispheres. Background mild chronic small vessel ischemic disease within the cerebral Brimley matter and pons. Mild generalized parenchymal atrophy. Left sphenoid sinusitis. Electronically Signed   By: Kellie Simmering DO   On: 07/24/2020 14:46   MR BRAIN W CONTRAST  Result Date: 07/25/2020 CLINICAL DATA:  Stroke follow-up. EXAM: MRI HEAD WITH CONTRAST TECHNIQUE: Multiplanar, multiecho pulse sequences of the brain and surrounding structures were obtained with intravenous contrast. CONTRAST:  7.73m GADAVIST GADOBUTROL 1 MMOL/ML IV SOLN COMPARISON:  07/24/2020 MRI head. FINDINGS: Please note that motion artifact limits evaluation. Brain: Mild diffuse parenchymal volume loss with ex vacuo dilatation. No midline shift, ventriculomegaly or extra-axial fluid collection. Redemonstration of left frontal infarct. Associated subarachnoid hemorrhage is less conspicuous. Curvilinear left frontal enhancement and curvilinear right frontal intrinsic T1 hyperintensity reflects cortical laminar necrosis. There is no masslike enhancement to suggest a focal lesion. Sequela of remote insult involving the bilateral frontal, bilateral parietal and left occipital regions. Smaller remote infarcts are seen involving the right basal ganglia, left thalamus and bilateral cerebellum. Background chronic microvascular ischemic changes are better demonstrated on prior exam. Vascular: Grossly normal flow voids. Skull and upper cervical spine: No focal lesion. Sinuses/Orbits: Normal orbits. Mild left sphenoid sinus disease. No mastoid effusion. Other: None. IMPRESSION: 1. Redemonstration of left frontal infarct with cortical laminar necrosis. No masslike enhancement to suggest focal  lesion. 2. Sequela of remote bilateral cerebral, bilateral cerebellar, right basal ganglia and left thalamic infarcts. Right frontal cortical laminar necrosis. 3. Chronic microvascular ischemic changes.  Mild cerebral atrophy. Electronically Signed   By: CPrimitivo GauzeM.D.   On: 07/25/2020 12:05   ECHOCARDIOGRAM COMPLETE  Result Date: 07/25/2020    ECHOCARDIOGRAM REPORT   [facility]: 07/24/2020 Medical Rec #:  [id number]    Height:       66.0 in Accession #:    [id number]   Weight:       125.0 lb Date of Birth:  [birth year]     BSA:          1.638 m Patient Age:    89years      BP:           120/55 mmHg Patient Gender: F             HR:           85 bpm. Exam Location:  [facility] Procedure: 2D Echo, Cardiac Doppler and Color Doppler Indications:     I163.9 Stroke  History:         Patient has no prior history of Echocardiogram examinations.                  Risk Factors:Hypertension and Diabetes. Stroke. Chronic kidney                  disease.  Sonographer:     Wilford Sports Rodgers-Jones Referring Phys:  [id number] [place] Diagnosing Phys: Nelva Bush MD IMPRESSIONS  1. Left ventricular ejection fraction, by estimation, is 60 to 65%. The left ventricle has normal function. Left ventricular endocardial border not optimally defined to evaluate regional wall motion. Left ventricular diastolic parameters are consistent with Grade I diastolic dysfunction (impaired relaxation).  2. Right ventricular systolic function is normal. The right ventricular size is normal. Mildly increased right ventricular wall thickness. There is normal pulmonary artery systolic pressure.  3. 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 No evidence of mitral stenosis.  4. The aortic valve is tricuspid. Aortic valve regurgitation is not visualized. No aortic stenosis is present.  5. The inferior vena cava is normal in size with greater than 50% respiratory variability,  suggesting right atrial pressure of 3 mmHg. FINDINGS  Left Ventricle: Left ventricular ejection fraction, by estimation, is 60 to 65%. The left ventricle has normal function. Left ventricular endocardial border not optimally defined to evaluate regional wall motion. The left ventricular internal cavity size was normal in size. There is no left ventricular hypertrophy. Left ventricular diastolic parameters are consistent with Grade I diastolic dysfunction (impaired relaxation). Right Ventricle: The right ventricular size is normal. Mildly increased right ventricular wall thickness. Right ventricular systolic function is normal. There is normal pulmonary artery systolic pressure. The tricuspid regurgitant velocity is 2.48 m/s, and with an assumed right atrial pressure of 3 mmHg, the estimated right ventricular systolic pressure is 45.4 mmHg. Left Atrium: Left atrial size was normal in size. Right Atrium: Right atrial size was normal in size. Pericardium: Trivial pericardial effusion is present. The pericardial effusion is posterior to the left ventricle. 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 There is mild thickening of the mitral valve leaflet(s). Mild to moderate mitral valve regurgitation. No evidence of mitral valve stenosis. Tricuspid Valve: The tricuspid valve is normal in structure. Tricuspid valve regurgitation is mild. Aortic Valve: The aortic valve is tricuspid. Aortic valve regurgitation is not visualized. No aortic stenosis is present. Pulmonic Valve: The pulmonic valve was grossly normal. Pulmonic valve regurgitation is trivial. No evidence of pulmonic stenosis. Aorta: The aortic root and ascending aorta are structurally normal, with no evidence of dilitation. Pulmonary Artery: The pulmonary artery is not well seen. Venous: The inferior vena cava is normal in size with greater than 50% respiratory variability, suggesting right atrial pressure of 3  mmHg. IAS/Shunts: The interatrial septum was not well visualized.  LEFT VENTRICLE PLAX 2D LVIDd:         4.36 cm  Diastology LVIDs:         2.65 cm  LV e' lateral:   8.59 cm/s LV PW:         0.83 cm  LV E/e' lateral: 7.6 LV IVS:        0.84 cm  LV e' medial:    5.77 cm/s LVOT diam:     1.90 cm  LV E/e'  medial:  11.4 LV SV:         56 LV SV Index:   34 LVOT Area:     2.84 cm  RIGHT VENTRICLE RV Basal diam:  3.96 cm RV S prime:     16.70 cm/s TAPSE (M-mode): 2.4 cm LEFT ATRIUM             Index       RIGHT ATRIUM           Index LA diam:        3.70 cm 2.26 cm/m  RA Area:     12.10 cm LA Vol (A2C):   49.8 ml 30.41 ml/m RA Volume:   31.70 ml  19.36 ml/m LA Vol (A4C):   41.5 ml 25.34 ml/m LA Biplane Vol: 45.9 ml 28.03 ml/m  AORTIC VALVE LVOT Vmax:   103.00 cm/s LVOT Vmean:  71.700 cm/s LVOT VTI:    0.196 m  AORTA Ao Root diam: 3.20 cm Ao Asc diam:  3.20 cm MITRAL VALVE               TRICUSPID VALVE MV Area (PHT): 2.66 cm    TR Peak grad:   24.6 mmHg MV Decel Time: 285 msec    TR Vmax:        248.00 cm/s MV E velocity: 65.60 cm/s MV A velocity: 83.60 cm/s  SHUNTS MV E/A ratio:  0.78        Systemic VTI:  0.20 m                            Systemic Diam: 1.90 cm Nelva Bush MD Electronically signed by Nelva Bush MD Signature Date/Time: 07/25/2020/12:28:15 AM    Final        Assessment/Plan:   ACUTE CVA WITH HEMORRHAGIC TRANSFORMATION FOLLOW UP NEURO RECS FAMILY REQUESTING TRANSFER TO [facility] HOWEVER, NO BEDS AVAILABLE AND NEURO AT [facility] HAS REFUSED TO TAKE PATIENT AT THIS TIME AVOID SECONDARY BRAIN INJURY  TRANSFER TO [facility] SERVICE 8/5     Corrin Parker, M.D.  [facility]  Medical Director [place] Director [place] Department

## 2020-07-25 NOTE — Progress Notes (Signed)
Inpatient Diabetes Program Recommendations  AACE/ADA: New Consensus Statement on Inpatient Glycemic Control   Target Ranges:  Prepandial:   less than 140 mg/dL      Peak postprandial:   less than 180 mg/dL (1-2 hours)      Critically ill patients:  140 - 180 mg/dL   Results for Susan Navarro, Susan Navarro (MRN [id number]) as of 07/25/2020 13:05  Ref. Range 07/24/2020 00:33 07/24/2020 04:18 07/24/2020 04:18 07/24/2020 09:49 07/24/2020 11:10 07/24/2020 12:39 07/24/2020 14:47 07/24/2020 18:04 07/24/2020 19:34 07/24/2020 21:36 07/24/2020 22:44 07/25/2020 00:10 07/25/2020 05:13  Glucose-Capillary Latest Ref Range: 70 - 99 mg/dL 474 (H) 369 (H) 369 (H) 286 (H) 283 (H) 202 (H) 146 (H) 146 (H) 145 (H) 184 (H) 206 (H) 101 (H) 152 (H)   Review of Glycemic Control  Outpatient Diabetes medications: Lantus 22 units daily, Humalog 2-3 units TID with meals Current orders for Inpatient glycemic control: Novolog 0-9 units TID with meals, Novolog 0-5 units QHS  Inpatient Diabetes Program Recommendations:    Insulin-Basal: Noted Levemir 5 units given at 20:35 last night. Please consider ordering Levemir 5 units QHS.  Thanks, Barnie Alderman, RN, MSN, CDE Diabetes Coordinator Inpatient Diabetes Program [phone] (Team Pager from 8am to 5pm)

## 2020-07-25 NOTE — Evaluation (Addendum)
Physical Therapy Evaluation Patient Details Name: Susan Navarro MRN: [id number] DOB: 1961-03-10 Today's Date: 07/25/2020   History of Present Illness  Pt is a 59 yo female that presented to ED for confusion, inability to speak. PMH of anemia, CKD, DM, HTN, previous stroke. Did have a fall about 3-4weeks ago that resulted in a broken hip. Per imaging, CT head in the ED concerning for a left frontal infarct with some hemorrhagic transformation. Recent hospital stay 7/7 due to fall and imaging reveals L moderately displaced left superior and inferior pubic rami fractures.    Clinical Impression  Patient alert, agreeable to PT, initially reported L leg pain with mobility, may be due to previous pelvic fx. Pt with some difficulty with word finding, oriented to self, place, disoriented to situation and date. The patient may be questionable historian, reported she lived in a house alone, that her daughter and her boyfriend are staying with her (?) and assist with ADLs. Reported 1 fall in the last 6 months. Ambulating with RW in home.  Upon assessment the patient reported no deficits of light touch sensation of UEs or LEs, exhibited coordinated movements of UE and LE, did have some L sided LE weakness compared to R, but may be due to recent pelvic fx. The patient demonstrated bed mobility modI, and sit <> Stand with RW and CGA. The patient was able to ambulate ~150f with RW and CGA. She did exhibit increased knee valgus bilaterally and decreased gait velocity but overall steady.  Pt returned to room and all needs placed in reach. The patient demonstrated deficits (see "PT Problem List") that impede the patient's functional abilities, safety, and mobility and would benefit from skilled PT intervention. Recommendation is HHPT with intermittent supervision.      Follow Up Recommendations Home health PT;Supervision - Intermittent    Equipment Recommendations  None recommended by PT;Other (comment) (pt has RW at  home)    Recommendations for Other Services       Precautions / Restrictions Precautions Precautions: Fall Precaution Comments: 410wksold L superior and inferior pubic rami fx Restrictions Weight Bearing Restrictions: No      Mobility  Bed Mobility Overal bed mobility: Needs Assistance Bed Mobility: Supine to Sit     Supine to sit: Modified independent (Device/Increase time);HOB elevated        Transfers Overall transfer level: Needs assistance Equipment used: Rolling walker (2 wheeled) Transfers: Sit to/from Stand Sit to Stand: Supervision            Ambulation/Gait   Gait Distance (Feet): 120 Feet Assistive device: Rolling walker (2 wheeled)       General Gait Details: CGA provided though may not have been required, bilateral knee valgus noted, but no overt unsteadiness or pt complaints of fatigue  Stairs            Wheelchair Mobility    Modified Rankin (Stroke Patients Only)       Balance Overall balance assessment: Needs assistance Sitting-balance support: Feet supported Sitting balance-Leahy Scale: Good       Standing balance-Leahy Scale: Fair                               Pertinent Vitals/Pain Pain Assessment: Faces Faces Pain Scale: Hurts a little bit Pain Location: L leg due to history of pelvic fxs Pain Intervention(s): Limited activity within patient's tolerance;Monitored during session;Repositioned    Home Living Family/patient expects to be discharged  to:: Private residence Living Arrangements: Alone Available Help at Discharge: Family;Friend(s);Available PRN/intermittently;Other (Comment) (boyfriend to assist) Type of Home: Apartment Home Access: Stairs to enter Entrance Stairs-Rails: None Entrance Stairs-Number of Steps: 1 curb Home Layout: Two level;Able to live on main level with bedroom/bathroom Home Equipment: Youth worker - 2 wheels;Cane - single point;Toilet riser;Shower seat;Hand held  shower head;Grab bars - tub/shower      Prior Function Level of Independence: Needs assistance   Gait / Transfers Assistance Needed: using RW for household mobility  ADL's / Homemaking Assistance Needed: boyfriend/daughter assist as needed, with bathing and dressing. Reported she is eating mostly "out".        Hand Dominance   Dominant Hand: Right    Extremity/Trunk Assessment   Upper Extremity Assessment Upper Extremity Assessment: Generalized weakness (light touch sensation grossly intact, strength symmetrical, coordination intact)    Lower Extremity Assessment Lower Extremity Assessment: RLE deficits/detail;LLE deficits/detail RLE Deficits / Details: grossly 4+/5 RLE Sensation: WNL RLE Coordination: WNL LLE Deficits / Details: mildly decreased hip flexion and knee flexion 4/5, may be due to patients recent pelvic fx LLE Sensation: WNL LLE Coordination: WNL    Cervical / Trunk Assessment Cervical / Trunk Assessment: Normal  Communication   Communication: Expressive difficulties  Cognition Arousal/Alertness: Awake/alert Behavior During Therapy: WFL for tasks assessed/performed Overall Cognitive Status: No family/caregiver present to determine baseline cognitive functioning                                 General Comments: pt oriented to self, place, disoriented to month/year, situation      General Comments      Exercises     Assessment/Plan    PT Assessment Patient needs continued PT services  PT Problem List Decreased strength;Decreased range of motion;Decreased activity tolerance;Decreased balance;Pain;Decreased mobility;Decreased knowledge of use of DME       PT Treatment Interventions DME instruction;Balance training;Gait training;Neuromuscular re-education;Stair training;Functional mobility training;Patient/family education;Therapeutic activities;Therapeutic exercise    PT Goals (Current goals can be found in the Care Plan section)   Acute Rehab PT Goals Patient Stated Goal: to get up and walk PT Goal Formulation: With patient Time For Goal Achievement: 08/08/20 Potential to Achieve Goals: Good    Frequency 7X/week   Barriers to discharge        Co-evaluation               AM-PAC PT "6 Clicks" Mobility  Outcome Measure Help needed turning from your back to your side while in a flat bed without using bedrails?: A Little Help needed moving from lying on your back to sitting on the side of a flat bed without using bedrails?: A Little Help needed moving to and from a bed to a chair (including a wheelchair)?: A Little Help needed standing up from a chair using your arms (e.g., wheelchair or bedside chair)?: A Little Help needed to walk in hospital room?: A Little Help needed climbing 3-5 steps with a railing? : A Little 6 Click Score: 18    End of Session Equipment Utilized During Treatment: Gait belt Activity Tolerance: Patient tolerated treatment well Patient left: in bed;with call bell/phone within reach Nurse Communication: Mobility status PT Visit Diagnosis: Other abnormalities of gait and mobility (R26.89);Muscle weakness (generalized) (M62.81);Difficulty in walking, not elsewhere classified (R26.2);Pain Pain - Right/Left: Left Pain - part of body: Hip    Time: 1335-1400 PT Time Calculation (min) (ACUTE ONLY): 25 min  Charges:   PT Evaluation $PT Eval Low Complexity: 1 Low PT Treatments $Therapeutic Exercise: 8-22 mins       Lieutenant Diego PT, DPT 3:19 PM,07/25/20

## 2020-07-25 NOTE — Evaluation (Signed)
Occupational Therapy Evaluation Patient Details Name: Susan Navarro MRN: [id number] DOB: Jul 26, 1961 Today's Date: 07/25/2020    History of Present Illness Pt is a 59 yo female that presented to ED for confusion, inability to speak. PMH of anemia, CKD, DM, HTN, previous stroke. Did have a fall about 3-4weeks ago that resulted in a broken hip. Per imaging, CT head in the ED concerning for a left frontal infarct with some hemorrhagic transformation. Recent hospital stay 7/7 due to fall and imaging reveals L moderately displaced left superior and inferior pubic rami fractures.   Clinical Impression   Susan Navarro was seen for OT evaluation this date. Prior to hospital admission, pt required some assistance from friends/family members for functional transfers and IADL management. Pt lives alone in a multi-level home with one step to enter. Pt provides some conflicting information regarding home set-up/PLOF across OT/PT evaluations, most PLOF/history obtained through chart review. No family/caregiver present to verify. Currently pt demonstrates impairments as described below (See OT problem list) which functionally limit her ability to perform ADL/self-care tasks. Pt currently requires set-up/supervision for functional mobility and BADL management including bathing and dressing tasks. Pt would benefit from skilled OT services to address noted impairments and functional limitations (see below for any additional details) in order to maximize safety and independence while minimizing falls risk and caregiver burden. Upon hospital discharge, recommend HHOT to maximize pt safety and return to functional independence during meaningful occupations of daily life.    Follow Up Recommendations  Home health OT;Supervision - Intermittent    Equipment Recommendations  3 in 1 bedside commode    Recommendations for Other Services       Precautions / Restrictions Precautions Precautions: Fall Precaution Comments: 80wks  old L superior and inferior pubic rami fx Restrictions Weight Bearing Restrictions: No      Mobility Bed Mobility Overal bed mobility: Needs Assistance Bed Mobility: Rolling Rolling: Modified independent (Device/Increase time)   Supine to sit: Modified independent (Device/Increase time);HOB elevated     General bed mobility comments: Pt remins in bed t/o OT session, endorses fatigue, had recently worked with physical therapy. Was MOD I for bed mobility with PT, pt is MOD I for rolling using bed rails during session.  Transfers Overall transfer level: Needs assistance Equipment used: Rolling walker (2 wheeled) Transfers: Sit to/from Stand Sit to Stand: Supervision         General transfer comment: Per physical therapist, pt is supervision level for functional mobility using a RW.    Balance Overall balance assessment: Needs assistance Sitting-balance support: Feet supported Sitting balance-Leahy Scale: Good Sitting balance - Comments: Pt able to sit unsupported in long sitting in bed w/o UE support. Steady static sitting, reaching within BOS.     Standing balance-Leahy Scale: Fair                             ADL either performed or assessed with clinical judgement   ADL Overall ADL's : Needs assistance/impaired                                       General ADL Comments: Pt participates in bed level clean-up after BM and gown change during session. She is able to roll independently in bed, and is able to don hospital gown given min A from therapist to locate sleeve. Anticipate supervision to [facility]  A for exertional STS activities.     Vision Baseline Vision/History: Wears glasses Wears Glasses: At all times Patient Visual Report: No change from baseline       Perception     Praxis      Pertinent Vitals/Pain Pain Assessment: Faces Faces Pain Scale: Hurts a little bit Pain Location: L leg due to history of pelvic fxs Pain Descriptors /  Indicators: Sore Pain Intervention(s): Limited activity within patient's tolerance;Monitored during session;Repositioned     Hand Dominance Right   Extremity/Trunk Assessment Upper Extremity Assessment Upper Extremity Assessment: Generalized weakness (Pt sensation, strength, and coordination grossly equal t/o BUE. She denies sensation loss or numbness in either UE this date. Grip generally weak 3/5.)   Lower Extremity Assessment Lower Extremity Assessment: Defer to PT evaluation;Overall WFL for tasks assessed RLE Deficits / Details: grossly 4+/5 RLE Sensation: WNL RLE Coordination: WNL LLE Deficits / Details: mildly decreased hip flexion and knee flexion 4/5, may be due to patients recent pelvic fx LLE Sensation: WNL LLE Coordination: WNL   Cervical / Trunk Assessment Cervical / Trunk Assessment: Normal   Communication Communication Communication: Expressive difficulties   Cognition Arousal/Alertness: Awake/alert Behavior During Therapy: WFL for tasks assessed/performed Overall Cognitive Status: No family/caregiver present to determine baseline cognitive functioning                                 General Comments: Generally oriented to self, is noted to have some difficulty with word finding/expression. Will continue to assess.   General Comments       Exercises Other Exercises Other Exercises: Pt educated on role of OT in acute setting, basic falls prevention, and routines modifications to support safety and functional independence upon hospital DC.   Shoulder Instructions      Home Living Family/patient expects to be discharged to:: Private residence Living Arrangements: Alone Available Help at Discharge: Family;Friend(s);Available PRN/intermittently;Other (Comment) (Boyfriend assists PRN, pt states Dtr is also here from [place]. to assist upon DC.) Type of Home: Apartment Home Access: Stairs to enter [employer] of Steps: 1 curb Entrance  Stairs-Rails: None Home Layout: Two level;Able to live on main level with bedroom/bathroom     Bathroom Shower/Tub: Tub/shower unit         Home Equipment: Youth worker - 2 wheels;Cane - single point;Toilet riser;Shower seat;Hand held shower head;Grab bars - tub/shower          Prior Functioning/Environment Level of Independence: Needs assistance  Gait / Transfers Assistance Needed: using RW for household mobility ADL's / Homemaking Assistance Needed: boyfriend/daughter assist as needed, with bathing and dressing. Reported she is eating mostly "out".   Comments: Pt provides conflicting information across OT/PT evaluation, may be unreliable historian. Information regarding home set-up/PLOF obtained primarily through chart review.        OT Problem List: Decreased strength;Decreased coordination;Decreased activity tolerance;Decreased safety awareness;Impaired balance (sitting and/or standing);Decreased knowledge of use of DME or AE;Impaired UE functional use      OT Treatment/Interventions: Self-care/ADL training;Therapeutic exercise;Therapeutic activities;Energy conservation;Patient/family education;DME and/or AE instruction;Balance training;Neuromuscular education;Cognitive remediation/compensation    OT Goals(Current goals can be found in the care plan section) Acute Rehab OT Goals Patient Stated Goal: to get up and walk OT Goal Formulation: With patient Time For Goal Achievement: 08/08/20 Potential to Achieve Goals: Good ADL Goals Pt Will Perform Eating: with modified independence;sitting (c Adapted utensils PRN for improved safety/functional indep.) Pt Will Perform Grooming: with modified independence;with  adaptive equipment;standing (c LRAD PRN for improved safety and functional indep.) Pt Will Transfer to Toilet: with modified independence;ambulating;regular height toilet;bedside commode (c LRAD PRN for improved safety and functional indep.) Pt Will Perform  Toileting - Clothing Manipulation and hygiene: sit to/from stand;with modified independence;with adaptive equipment (c LRAD PRN for improved safety and functional indep.)  OT Frequency: Min 2X/week   Barriers to D/C: Decreased caregiver support          Co-evaluation              AM-PAC OT "6 Clicks" Daily Activity     Outcome Measure Help from another person eating meals?: A Little Help from another person taking care of personal grooming?: A Little Help from another person toileting, which includes using toliet, bedpan, or urinal?: A Little Help from another person bathing (including washing, rinsing, drying)?: A Little Help from another person to put on and taking off regular upper body clothing?: A Little Help from another person to put on and taking off regular lower body clothing?: A Little 6 Click Score: 18   End of Session Nurse Communication: Mobility status  Activity Tolerance: Patient tolerated treatment well Patient left: in bed;with call bell/phone within reach  OT Visit Diagnosis: Other abnormalities of gait and mobility (R26.89);Muscle weakness (generalized) (M62.81);Other symptoms and signs involving the nervous system (D35.701)                Time: 7793-9030 OT Time Calculation (min): 20 min Charges:  OT General Charges $OT Visit: 1 Visit OT Evaluation $OT Eval Moderate Complexity: 1 Mod  Shara Blazing, M.S., OTR/L Ascom: [phone] 07/25/20, 4:16 PM

## 2020-07-25 NOTE — Progress Notes (Signed)
SLP Cancellation Note  Patient Details Name: Rhiannon Sassaman MRN: [id number] DOB: Mar 18, 1961   Cancelled treatment:       Reason Eval/Treat Not Completed: Medical issues which prohibited therapy   Pt very sleepy during attempt to evaluate. ST will continue to follow pt and will re-attempt as able.   Lamarco Gudiel B. Rutherford Nail M.S., [place], [place] Office [phone]    Stormy Fabian 07/25/2020, 12:51 PM

## 2020-07-26 ENCOUNTER — Encounter: Payer: Self-pay | Admitting: Internal Medicine

## 2020-07-26 DIAGNOSIS — E1029 Type 1 diabetes mellitus with other diabetic kidney complication: Secondary | ICD-10-CM

## 2020-07-26 DIAGNOSIS — E1065 Type 1 diabetes mellitus with hyperglycemia: Secondary | ICD-10-CM

## 2020-07-26 DIAGNOSIS — I10 Essential (primary) hypertension: Secondary | ICD-10-CM

## 2020-07-26 DIAGNOSIS — N184 Chronic kidney disease, stage 4 (severe): Secondary | ICD-10-CM

## 2020-07-26 LAB — BASIC METABOLIC PANEL
Anion gap: 7 (ref 5–15)
BUN: 33 mg/dL — ABNORMAL HIGH (ref 6–20)
CO2: 26 mmol/L (ref 22–32)
Calcium: 9 mg/dL (ref 8.9–10.3)
Chloride: 106 mmol/L (ref 98–111)
Creatinine, Ser: 1.46 mg/dL — ABNORMAL HIGH (ref 0.44–1.00)
GFR calc Af Amer: 45 mL/min — ABNORMAL LOW (ref >60)
GFR calc non Af Amer: 39 mL/min — ABNORMAL LOW (ref >60)
Glucose, Bld: 242 mg/dL — ABNORMAL HIGH (ref 70–99)
Potassium: 4.7 mmol/L (ref 3.5–5.1)
Sodium: 139 mmol/L (ref 135–145)

## 2020-07-26 LAB — GLUCOSE, CAPILLARY
Glucose-Capillary: 318 mg/dL — ABNORMAL HIGH (ref 70–99)
Glucose-Capillary: 398 mg/dL — ABNORMAL HIGH (ref 70–99)
Glucose-Capillary: 436 mg/dL — ABNORMAL HIGH (ref 70–99)
Glucose-Capillary: 388 mg/dL — ABNORMAL HIGH (ref 70–99)

## 2020-07-26 LAB — CBC WITH DIFFERENTIAL/PLATELET
Abs Immature Granulocytes: 0.01 10*3/uL (ref 0.00–0.07)
Abs Immature Granulocytes: 0.01 10*3/uL (ref 0.00–0.07)
Basophils Absolute: 0 10*3/uL (ref 0.0–0.1)
Basophils Absolute: 0 10*3/uL (ref 0.0–0.1)
Basophils Relative: 0 %
Basophils Relative: 0 %
Eosinophils Absolute: 0.1 10*3/uL (ref 0.0–0.5)
Eosinophils Absolute: 0.1 10*3/uL (ref 0.0–0.5)
Eosinophils Relative: 4 %
Eosinophils Relative: 2 %
HCT: 28.4 % — ABNORMAL LOW (ref 36.0–46.0)
HCT: 33.2 % — ABNORMAL LOW (ref 36.0–46.0)
Hemoglobin: 9.7 g/dL — ABNORMAL LOW (ref 12.0–15.0)
Hemoglobin: 11 g/dL — ABNORMAL LOW (ref 12.0–15.0)
Immature Granulocytes: 0 %
Immature Granulocytes: 0 %
Lymphocytes Relative: 32 %
Lymphocytes Relative: 18 %
Lymphs Abs: 1 10*3/uL (ref 0.7–4.0)
Lymphs Abs: 0.6 10*3/uL — ABNORMAL LOW (ref 0.7–4.0)
MCH: 31.4 pg (ref 26.0–34.0)
MCH: 30.8 pg (ref 26.0–34.0)
MCHC: 34.2 g/dL (ref 30.0–36.0)
MCHC: 33.1 g/dL (ref 30.0–36.0)
MCV: 91.9 fL (ref 80.0–100.0)
MCV: 93 fL (ref 80.0–100.0)
Monocytes Absolute: 0.3 10*3/uL (ref 0.1–1.0)
Monocytes Absolute: 0.3 10*3/uL (ref 0.1–1.0)
Monocytes Relative: 8 %
Monocytes Relative: 9 %
Neutro Abs: 1.6 10*3/uL — ABNORMAL LOW (ref 1.7–7.7)
Neutro Abs: 2.3 10*3/uL (ref 1.7–7.7)
Neutrophils Relative %: 56 %
Neutrophils Relative %: 71 %
Platelets: 77 10*3/uL — ABNORMAL LOW (ref 150–400)
Platelets: 116 10*3/uL — ABNORMAL LOW (ref 150–400)
RBC: 3.09 MIL/uL — ABNORMAL LOW (ref 3.87–5.11)
RBC: 3.57 MIL/uL — ABNORMAL LOW (ref 3.87–5.11)
RDW: 13.7 % (ref 11.5–15.5)
RDW: 13.7 % (ref 11.5–15.5)
WBC: 3 10*3/uL — ABNORMAL LOW (ref 4.0–10.5)
WBC: 3.2 10*3/uL — ABNORMAL LOW (ref 4.0–10.5)
nRBC: 0 % (ref 0.0–0.2)
nRBC: 0 % (ref 0.0–0.2)

## 2020-07-26 LAB — BPAM PLATELET PHERESIS
Blood Product Expiration Date: 202108062359
ISSUE DATE / TIME: 202108041701
Unit Type and Rh: 7300

## 2020-07-26 LAB — PREPARE PLATELET PHERESIS: Unit division: 0

## 2020-07-26 LAB — MAGNESIUM: Magnesium: 1.6 mg/dL — ABNORMAL LOW (ref 1.7–2.4)

## 2020-07-26 LAB — PHOSPHORUS: Phosphorus: 2.8 mg/dL (ref 2.5–4.6)

## 2020-07-26 MED ORDER — AMLODIPINE BESYLATE 10 MG PO TABS: 10.0000 mg | ORAL_TABLET | Freq: Every day | ORAL | Status: DC

## 2020-07-26 MED ORDER — LISINOPRIL 5 MG PO TABS: 2.5000 mg | ORAL_TABLET | Freq: Every evening | ORAL | Status: DC

## 2020-07-26 MED ORDER — AMLODIPINE BESYLATE 5 MG PO TABS: 5.0000 mg | ORAL_TABLET | Freq: Every day | ORAL | Status: DC

## 2020-07-26 MED ORDER — INSULIN GLARGINE 100 UNIT/ML ~~LOC~~ SOLN
12.0000 [IU] | Freq: Every day | SUBCUTANEOUS | Status: DC
  Administered 2020-07-26 – 2020-07-28 (×3): 12 [IU] via SUBCUTANEOUS
  Filled 2020-07-26 (×4): qty 0.12

## 2020-07-26 MED ORDER — SODIUM CHLORIDE 0.9% IV SOLUTION: Freq: Once | INTRAVENOUS | Status: DC

## 2020-07-26 MED ORDER — HYDRALAZINE HCL 20 MG/ML IJ SOLN
10.0000 mg | Freq: Four times a day (QID) | INTRAMUSCULAR | Status: DC | PRN
  Administered 2020-07-26: 10 mg via INTRAVENOUS
  Filled 2020-07-26: qty 1

## 2020-07-26 MED ORDER — INSULIN ASPART 100 UNIT/ML IV SOLN
8.0000 [IU] | Freq: Once | INTRAVENOUS | Status: AC
  Administered 2020-07-26: 8 [IU] via INTRAVENOUS
  Filled 2020-07-26: qty 0.08

## 2020-07-26 MED ORDER — MAGNESIUM SULFATE 2 GM/50ML IV SOLN
2.0000 g | Freq: Once | INTRAVENOUS | Status: AC
  Administered 2020-07-26: 2 g via INTRAVENOUS
  Filled 2020-07-26: qty 50

## 2020-07-26 MED ORDER — ATORVASTATIN CALCIUM 80 MG PO TABS
80.0000 mg | ORAL_TABLET | Freq: Every day | ORAL | Status: DC
  Administered 2020-07-27 – 2020-07-29 (×3): 80 mg via ORAL
  Filled 2020-07-26 (×4): qty 1
  Filled 2020-07-26: qty 4

## 2020-07-26 MED ORDER — AMLODIPINE BESYLATE 10 MG PO TABS
10.0000 mg | ORAL_TABLET | Freq: Every day | ORAL | Status: DC
  Administered 2020-07-27 – 2020-07-29 (×3): 10 mg via ORAL
  Filled 2020-07-26 (×3): qty 1

## 2020-07-26 NOTE — Progress Notes (Signed)
PHARMACY CONSULT NOTE - FOLLOW UP  Pharmacy Consult for Electrolyte Monitoring and Replacement   Recent Labs: Potassium (mmol/L)  Date Value  07/26/2020 4.7   Magnesium (mg/dL)  Date Value  07/26/2020 1.6 (L)   Calcium (mg/dL)  Date Value  07/26/2020 9.0   Albumin (g/dL)  Date Value  07/24/2020 4.1   Phosphorus (mg/dL)  Date Value  07/26/2020 2.8   Sodium (mmol/L)  Date Value  07/26/2020 139   Assessment: 59 year old female admitted with acute hemorrhagic infarct. Patient with h/o diabetes, significantly hyperglycemic on arrival. Patient started on insulin drip. Pharmacy to manage electrolytes.   Goal of Therapy:  Electrolytes WNL  Plan:  Magnesium 2 g IV x 1. Will continue to monitor. All electrolytes with morning labs.  Tawnya Crook, PharmD 07/26/2020 11:21 AM

## 2020-07-26 NOTE — Progress Notes (Signed)
Inpatient Diabetes Program Recommendations  AACE/ADA: New Consensus Statement on Inpatient Glycemic Control   Target Ranges:  Prepandial:   less than 140 mg/dL      Peak postprandial:   less than 180 mg/dL (1-2 hours)      Critically ill patients:  140 - 180 mg/dL   Results for Susan Navarro, Susan Navarro (MRN [id number]) as of 07/26/2020 08:41  Ref. Range 07/25/2020 00:10 07/25/2020 05:13 07/25/2020 15:46 07/25/2020 21:33 07/26/2020 07:21  Glucose-Capillary Latest Ref Range: 70 - 99 mg/dL 101 (H) 152 (H) 420 (H) 259 (H) 318 (H)  Results for Susan Navarro, Susan Navarro (MRN [id number]) as of 07/26/2020 08:41  Ref. Range 07/24/2020 12:39 07/24/2020 14:47 07/24/2020 18:04 07/24/2020 19:34 07/24/2020 21:36 07/24/2020 22:44  Glucose-Capillary Latest Ref Range: 70 - 99 mg/dL 202 (H) 146 (H) 146 (H) 145 (H) 184 (H) 206 (H)   Review of Glycemic Control  Outpatient Diabetes medications: Lantus 22 units daily, Humalog 2-3 units TID with meals Current orders for Inpatient glycemic control: Novolog 0-9 units TID with meals, Novolog 0-5 units QHS  Inpatient Diabetes Program Recommendations:    Insulin-Basal: Noted Levemir 5 units given at 20:35 lon 07/24/20 and none given on 07/25/20. Please consider ordering Lantus 12 units Q24H.  Thanks, Barnie Alderman, RN, MSN, CDE Diabetes Coordinator Inpatient Diabetes Program [phone] (Team Pager from 8am to 5pm)

## 2020-07-26 NOTE — Evaluation (Signed)
Speech Language Pathology Evaluation Patient Details Name: Susan Navarro MRN: [id number] DOB: May 17, 1961 Today's Date: 07/26/2020 Time: 1110-1140 SLP Time Calculation (min) (ACUTE ONLY): 30 min  Problem List:  Patient Active Problem List   Diagnosis Date Noted  . ICH (intracerebral hemorrhage) ([place]) 07/24/2020  . SAH (subarachnoid hemorrhage) ([place]) 07/24/2020  . Fall 06/27/2020  . HTN (hypertension) 02/16/2020  . CKD (chronic kidney disease), stage IV ([place]) 12/30/2019  . Other cirrhosis of liver ([place]) 12/30/2019  . Thrombocytopenia ([place]) 12/30/2019  . 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 Transaminitis 12/30/2019  . Malnutrition of moderate degree 12/28/2019  . Anemia of chronic disease   . Generalized weakness   . Altered mental status   . DKA (diabetic ketoacidoses) ([place]) 12/25/2019  . Hyponatremia   . Hyperkalemia   . Stroke [facility]) 03/2018   Past Medical History:  Past Medical History:  Diagnosis Date  . Anemia   . Chronic kidney disease   . Diabetes mellitus without complication ([place])   . Hypertension   . Stroke [facility]) 03/2018   Ischemic stroke per [facility] records   Past Surgical History: History reviewed. No pertinent surgical history. HPI:  Pt is a 59 yo female that presented to ED for confusion, inability to speak. PMH of anemia, CKD, DM, HTN, previous stroke. Did have a fall about 3-4weeks ago that resulted in a broken hip. Per imaging, CT head in the ED concerning for a left frontal infarct with some hemorrhagic transformation. Recent hospital stay 7/7 due to fall and imaging reveals L moderately displaced left superior and inferior pubic rami fractures.   Assessment / Plan / Recommendation Clinical Impression  Pt presents with grossly intact basic cognitive functions for attention, problem solving her phone, recall of previous activities and her speech if intelligible. she does have intermittent word finding deficits and while  she is aware of the difficulty , it is hard for her to self-correct. Minimal cues reuiqred by SLP to aid in recall of word. Despite word finding deficits, she is able to fully communicate her wants/needs and has intact receptive language abilities. Would recommend HHST at discharge.     SLP Assessment  SLP Recommendation/Assessment: All further Speech Lanaguage Pathology  needs can be addressed in the next venue of care SLP Visit Diagnosis: Cognitive communication deficit (R41.841)    Follow Up Recommendations  (P) Home health SLP          SLP Evaluation Cognition  Overall Cognitive Status: No family/caregiver present to determine baseline cognitive functioning Arousal/Alertness: Awake/alert Orientation Level: Oriented to person;Oriented to place Attention: Selective Selective Attention: Appears intact Memory:  (difficult to assess d/t word finding deficits) Awareness: Impaired Awareness Impairment: Emergent impairment Problem Solving: Appears intact Safety/Judgment: Appears intact       Comprehension  Auditory Comprehension Overall Auditory Comprehension: Appears within functional limits for tasks assessed Visual Recognition/Discrimination Discrimination: Not tested Reading Comprehension Reading Status: Not tested    Expression Expression Primary Mode of Expression: Verbal Verbal Expression Overall Verbal Expression: Impaired Initiation: No impairment Automatic Speech: Name;Social Response Level of Generative/Spontaneous Verbalization: Sentence Repetition: No impairment Naming: Impairment Other Naming Comments:  (intermittent word finding deficits) Verbal Errors: Semantic paraphasias Pragmatics: No impairment Non-Verbal Means of Communication: Not applicable Written Expression Dominant Hand: Right Written Expression: Not tested   Oral / Motor  Oral Motor/Sensory Function Overall Oral Motor/Sensory Function: Within functional limits Motor Speech Overall Motor  Speech: Appears within functional limits for tasks assessed Respiration: Within functional limits  Phonation: Normal Resonance: Within functional limits Articulation: Within functional limitis Intelligibility: Intelligible Motor Planning: Witnin functional limits Motor Speech Errors: Not applicable   GO                   Cherre Kothari B. Rutherford Nail M.S., CCC-SLP, [place] Office [phone]  Stormy Fabian 07/26/2020, 12:53 PM

## 2020-07-26 NOTE — Progress Notes (Signed)
PROGRESS NOTE    Susan Navarro  XTK:[id number] DOB: 21-Dec-1961 DOA: 07/24/2020 PCP: Rochel Brome, MD   Brief Narrative:  Susan Navarro is a 59 y.o. female  has a past medical history of Anemia, Chronic kidney disease, Diabetes mellitus without complication ([place]), Hypertension, and Stroke ([place]) (03/2018) who presents with episode of confusion. Patient does have some aphasia on my evaluation which limits the history and probably will need colateral. She reports that she had a fall about a week ago and has a fracture of her pelvis.  She went to bed at 6pm yesterday, woke up at 11PM and both daughter and boyfiend mentioned that she was not herself and confused. She endorses that she was unable to talk. Could not get her words out.  Since admission pt has been in ICU and managed with neurology. Mri has showed small SAH and left anterofrontal moderate sized ICH. Pt has been thrombocytopenic  And has h/o of thrombocytopenia and chart review shows chronic thrombocytopenia as far back as 2017. Pt also has a h/o CKD as far back as 2017 when creatinine was 1.35.     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 Goal BP Systolic of 992. -Off anticoagulation. -Manage thrombocytopenia, pt given platelets with goal of >100. -Cont statin therapy.  neurologically pt is fortunately alert and awake and without any deficit.   CKD: -Attibute to htn. -avoid nephrotoxic meds and studies.   Cirrhosis of liver/ Thrombocytopenia: - stable / INR wnl. - platelet transfused and will follow and repeat levels.  DM type I: -a1c is not at goal. -home regimen with Lantus continued and  SSI.  HTN; -amlodipine restarted and goal 140-160 SBP.    DVT prophylaxis:  Contraindicated.  Code Status:  Full. Family Communication: None at bedside.  Disposition Plan: Home PT vs STR.  Consultants:  Neurology  Procedures:   N/A.  Subjective: Pt today is alert awake and oriented and denise any chest pain palpitation sob , headache bv or any other symptoms.    Objective: Vitals:   07/26/20 0515 07/26/20 0656 07/26/20 0700 07/26/20 0713  BP: (!) 110/92 (!) 164/82 (!) 159/81 (!) 167/95  Pulse: (!) 108 98 98 97  Resp: (!) 23 13 15 15   Temp:  98.5 F (36.9 C)  98.2 F (36.8 C)  TempSrc:  Oral  Oral  SpO2: 100% 100% 100% 100%  Weight:      Height:        Intake/Output Summary (Last 24 hours) at 07/26/2020 1321 Last data filed at 07/26/2020 0400 Gross per 24 hour  Intake 1270.15 ml  Output --  Net 1270.15 ml   Filed Weights   07/24/20 0053 07/25/20 0017 07/25/20 0500  Weight: 56.7 kg 60.1 kg 60.1 kg    Examination: Blood pressure (!) 167/95, pulse 97, temperature 98.2 F (36.8 C), temperature source Oral, resp. rate 15, height 5' 6"  (1.676 m), weight 60.1 kg, SpO2 100 %.  General exam: Appears calm and comfortable  Respiratory system: Clear to auscultation. Respiratory effort normal. Cardiovascular system: S1 & S2 heard, RRR. No JVD, murmurs, rubs, gallops or clicks. No pedal edema. Gastrointestinal system: Abdomen is nondistended, soft and nontender.  No organomegaly or masses felt. Normal bowel sounds heard. Central nervous system: Alert and oriented. No focal neurological deficits. Extremities: Symmetric 5 x 5 power. Skin: No rashes, lesions or ulcers, CM grossly intact. Strength wnl. DTR 1+ in both knees biceps.  Psychiatry: Judgement and insight appear normal. Mood & affect appropriate.   Data Reviewed: I have personally reviewed following labs and imaging studies  CBC: Recent Labs  Lab 07/24/20 0048 07/25/20 0421 07/26/20 0437 07/26/20 1221  WBC  4.3 3.6* 3.0* 3.2*  NEUTROABS 2.6  --  1.6* 2.3  HGB 10.8* 9.8* 9.7* 11.0*  HCT 32.6* 28.1* 28.4* 33.2*  MCV 92.6 90.1 91.9 93.0  PLT 100* 88* 77* 956*   Basic Metabolic Panel: Recent Labs  Lab 07/24/20 0048 07/24/20 1545 07/24/20 2249 07/25/20 0421 07/26/20 0437  NA 131* 139  --  141 139  K 4.9 3.8 3.5 4.4 4.7  CL 91* 104  --  109 106  CO2 27 25  --  25 26  GLUCOSE 549* 136*  --  173* 242*  BUN 68* 48*  --  45* 33*  CREATININE 2.30* 1.56*  --  1.69* 1.46*  CALCIUM 9.9 9.0  --  8.6* 9.0  MG  --   --   --  1.9 1.6*  PHOS  --   --   --  2.4* 2.8   GFR: Estimated Creatinine Clearance: 38.4 mL/min (A) (by C-G formula based on SCr of 1.46 mg/dL (H)). Liver Function Tests: Recent Labs  Lab 07/24/20 0048  AST 18  ALT 71*  ALKPHOS 608*  BILITOT 0.8  PROT 7.8  ALBUMIN 4.1   No results for input(s): LIPASE, AMYLASE in the last 168 hours. No results for input(s): AMMONIA in the last 168 hours. Coagulation Profile: Recent Labs  Lab 07/24/20 0204 07/25/20 1322  INR 1.1 1.0   Cardiac Enzymes: No results for input(s): CKTOTAL, CKMB, CKMBINDEX, TROPONINI in the last 168 hours. BNP (last 3 results) No results for input(s): PROBNP in the last 8760 hours. HbA1C: Recent Labs    07/24/20 1229  HGBA1C 9.5*   CBG: Recent Labs  Lab 07/25/20 0513 07/25/20 1546 07/25/20 2133 07/26/20 0721 07/26/20 1221  GLUCAP 152* 420* 259* 318* 398*   Lipid Profile: Recent Labs    07/24/20 1229  CHOL 55  HDL 30*  LDLCALC 19  TRIG 28  CHOLHDL 1.8   Thyroid Function Tests: No results for input(s): TSH, T4TOTAL, FREET4, T3FREE, THYROIDAB in the last 72 hours. Anemia Panel: No results for input(s): VITAMINB12, FOLATE, FERRITIN, TIBC, IRON, RETICCTPCT in the last 72 hours. Sepsis Labs: No results for input(s): PROCALCITON, LATICACIDVEN in the last 168 hours.  Recent Results (from the past 240 hour(s))  SARS Coronavirus 2 by RT PCR (hospital order, performed in [facility]  hospital lab) Nasopharyngeal Nasopharyngeal Swab     Status: None   Collection Time: 07/24/20  8:39 AM   Specimen: Nasopharyngeal Swab  Result Value Ref Range Status   SARS Coronavirus 2 NEGATIVE NEGATIVE Final    Comment: (NOTE) SARS-CoV-2 target nucleic acids are NOT DETECTED.  The SARS-CoV-2 RNA is generally detectable in upper and lower respiratory specimens during the acute phase of infection. The lowest concentration of SARS-CoV-2 viral copies this assay can detect is 250 copies / mL. A negative result does not preclude SARS-CoV-2 infection and should not be used as the sole basis for treatment or other patient management decisions.  A negative result may occur with improper specimen collection /  handling, submission of specimen other than nasopharyngeal swab, presence of viral mutation(s) within the areas targeted by this assay, and inadequate number of viral copies (<250 copies / mL). A negative result must be combined with clinical observations, patient history, and epidemiological information.  Fact Sheet for Patients:   StrictlyIdeas.no  Fact Sheet for Healthcare Providers: BankingDealers.co.za  This test is not yet approved or  cleared by the [place] FDA and has been authorized for detection and/or diagnosis of SARS-CoV-2 by FDA under an Emergency Use Authorization (EUA).  This EUA will remain in effect (meaning this test can be used) for the duration of the COVID-19 declaration under Section 564(b)(1) of the Act, 21 U.S.C. section 360bbb-3(b)(1), unless the authorization is terminated or revoked sooner.  Performed at [facility], [address] [place], [place] [address]   MRSA PCR Screening     Status: None   Collection Time: 07/25/20 12:21 AM   Specimen: Nasal Mucosa; Nasopharyngeal  Result Value Ref Range Status   MRSA by PCR NEGATIVE NEGATIVE Final    Comment:        The GeneXpert MRSA Assay  (FDA approved for NASAL specimens only), is one component of a comprehensive MRSA colonization surveillance program. It is not intended to diagnose MRSA infection nor to guide or monitor treatment for MRSA infections. Performed at [facility], [address] [place], [place] [address]     Radiology Studies: CT HEAD WO CONTRAST  Result Date: 07/25/2020 CLINICAL DATA:  Follow-up of intracranial hemorrhage EXAM: CT HEAD WITHOUT CONTRAST TECHNIQUE: Contiguous axial images were obtained from the base of the skull through the vertex without intravenous contrast. COMPARISON:  None. FINDINGS: Brain: Unchanged small volume subarachnoid hemorrhage over the anterior left convexity. Parenchymal hypoattenuation here is also unchanged. The remainder of the brain is also unchanged. Vascular: Negative Skull: Negative Sinuses/Orbits: No acute finding. Other: None. IMPRESSION: Unchanged small volume subarachnoid hemorrhage over the anterior left convexity. Electronically Signed   By: Ulyses Jarred M.D.   On: 07/25/2020 03:46   MR BRAIN WO CONTRAST: Result Date: 07/24/2020 CLINICAL DATA:  Neuro deficit, acute, stroke suspected. Additional provided: 89-year-old female presenting with episode of confusion, recent fall. EXAM: MRI HEAD WITHOUT CONTRAST TECHNIQUE: Multiplanar, multiecho pulse sequences of the brain and surrounding structures were obtained without intravenous contrast. COMPARISON:  Head CT performed earlier the same day 07/24/2020 CT angiogram head 07/24/2020, brain MRI 12/25/2019. FINDINGS: Brain: Intermittently motion degraded examination. Most notably, there is mild-to-moderate motion degradation of the axial T2/FLAIR sequence and SWI sequence as well as axial T1 weighted sequence. There is moderate predominantly vasogenic edema within the anterolateral left frontal lobe. Within this region and abutting the cortical surface, there is a rounded 1.7 cm T2 intermediate signal (series 10, image  17). There is no definite restricted diffusion at this site. Unchanged overlying small volume subarachnoid hemorrhage. Stable background mild generalized parenchymal atrophy. Redemonstrated remote cortically based infarcts within the posterior right frontal lobe and right frontal operculum, posterior left frontal lobe, bilateral parietal lobes and left occipital lobe. Associated chronic blood products at site of the chronic infarcts within the posterior right frontal lobe and right frontal operculum as well as left parietal lobe. Additional scattered supratentorial chronic microhemorrhages. Chronic lacunar infarcts within the right lentiform nucleus and left thalamus. Numerous small chronic infarcts within the cerebellar hemispheres bilaterally. Background mild chronic small vessel ischemic changes within the cerebral [place] matter and pons. There is no acute infarct. No midline shift or hydrocephalus. Vascular:  Expected proximal arterial flow voids. Skull and upper cervical spine: No focal marrow lesion. Sinuses/Orbits: Visualized orbits show no acute finding. Frothy secretions and mild mucosal thickening within the left sphenoid sinus. No significant mastoid effusion. IMPRESSION: Motion degraded exam. Moderate-sized region of predominantly vasogenic edema within the anterolateral left frontal lobe. Within this region, and abutting the cortical surface, there is a 1.7 cm circumscribed focus of T2 intermediate signal. There is no appreciable restricted diffusion at this site and there is unchanged overlying small-volume subarachnoid hemorrhage. Findings are suspicious for a possible underlying mass and post-contrast MR imaging of the brain is recommended for further evaluation. Alternatively, these findings may reflect a late subacute infarct with hemorrhagic conversion or parenchymal hematoma with surrounding edema. Multifocal chronic cortically based infarcts within the bilateral cerebral hemispheres as described.  Chronic lacunar infarcts within the right basal ganglia, left thalamus and bilateral cerebellar hemispheres. Background mild chronic small vessel ischemic disease within the cerebral Mccluney matter and pons. Mild generalized parenchymal atrophy. Left sphenoid sinusitis. Electronically Signed   By: Kellie Simmering DO   On: 07/24/2020 14:46   MR BRAIN W CONTRAST Result Date: 07/25/2020 CLINICAL DATA:  Stroke follow-up. EXAM: MRI HEAD WITH CONTRAST TECHNIQUE: Multiplanar, multiecho pulse sequences of the brain and surrounding structures were obtained with intravenous contrast. CONTRAST:  7.28m GADAVIST GADOBUTROL 1 MMOL/ML IV SOLN COMPARISON:  07/24/2020 MRI head. FINDINGS: Please note that motion artifact limits evaluation. Brain: Mild diffuse parenchymal volume loss with ex vacuo dilatation. No midline shift, ventriculomegaly or extra-axial fluid collection. Redemonstration of left frontal infarct. Associated subarachnoid hemorrhage is less conspicuous. Curvilinear left frontal enhancement and curvilinear right frontal intrinsic T1 hyperintensity reflects cortical laminar necrosis. There is no masslike enhancement to suggest a focal lesion. Sequela of remote insult involving the bilateral frontal, bilateral parietal and left occipital regions. Smaller remote infarcts are seen involving the right basal ganglia, left thalamus and bilateral cerebellum. Background chronic microvascular ischemic changes are better demonstrated on prior exam. Vascular: Grossly normal flow voids. Skull and upper cervical spine: No focal lesion. Sinuses/Orbits: Normal orbits. Mild left sphenoid sinus disease. No mastoid effusion. Other: None. IMPRESSION: 1. Redemonstration of left frontal infarct with cortical laminar necrosis. No masslike enhancement to suggest focal lesion. 2. Sequela of remote bilateral cerebral, bilateral cerebellar, right basal ganglia and left thalamic infarcts. Right frontal cortical laminar necrosis. 3. Chronic  microvascular ischemic changes.  Mild cerebral atrophy.  Electronically Signed   By: CPrimitivo GauzeM.D.   On: 07/25/2020 12:05   ECHOCARDIOGRAM REPORT   [facility]: 07[facility]2020 Medical Rec #:  [id number]    Height:       66.0 in Accession #:    [id number]   Weight:       125.0 lb Date of Birth:  306-23-1962     BSA:          1.638 m Patient Age:    89years      BP:           120/55 mmHg Patient Gender: F             HR:           85 bpm. Exam Location:  [facility] Procedure: 2D Echo, Cardiac Doppler and Color Doppler Indications:     I163.9 Stroke  History:         Patient has no prior history of Echocardiogram examinations.  Risk Factors:Hypertension and Diabetes. Stroke. Chronic kidney                  disease.  Sonographer:     Wilford Sports Rodgers-Jones Referring Phys:  [id number] [place] Diagnosing Phys: Nelva Bush MD  IMPRESSIONS:   1. Left ventricular ejection fraction, by estimation, is 60 to 65%. The left ventricle has normal function. Left ventricular endocardial border not optimally defined to evaluate regional wall motion. Left ventricular diastolic parameters are consistent with Grade I diastolic dysfunction (impaired relaxation).  2. Right ventricular systolic function is normal. The right ventricular size is normal. Mildly increased right ventricular wall thickness. There is normal pulmonary artery systolic pressure.  3. 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 No evidence of mitral stenosis.  4. The aortic valve is tricuspid. Aortic valve regurgitation is not visualized. No aortic stenosis is present.  5. The inferior vena cava is normal in size with greater than 50% respiratory variability, suggesting right atrial pressure of 3 mmHg.  FINDINGS: Left Ventricle: Left ventricular ejection fraction, by estimation, is 60 to 65%. The left ventricle has normal function. Left ventricular endocardial border not  optimally defined to evaluate regional wall motion. The left ventricular internal cavity size was normal in size. There is no left ventricular hypertrophy. Left ventricular diastolic parameters are consistent with Grade I diastolic dysfunction (impaired relaxation). Right Ventricle: The right ventricular size is normal. Mildly increased right ventricular wall thickness. Right ventricular systolic function is normal. There is normal pulmonary artery systolic pressure. The tricuspid regurgitant velocity is 2.48 m/s, and with an assumed right atrial pressure of 3 mmHg, the estimated right ventricular systolic pressure is 64.3 mmHg. Left Atrium: Left atrial size was normal in size. Right Atrium: Right atrial size was normal in size. Pericardium: Trivial pericardial effusion is present. The pericardial effusion is posterior to the left ventricle. 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 There is mild thickening of the mitral valve leaflet(s). Mild to moderate mitral valve regurgitation. No evidence of mitral valve stenosis. Tricuspid Valve: The tricuspid valve is normal in structure. Tricuspid valve regurgitation is mild. Aortic Valve: The aortic valve is tricuspid. Aortic valve regurgitation is not visualized. No aortic stenosis is present. Pulmonic Valve: The pulmonic valve was grossly normal. Pulmonic valve regurgitation is trivial. No evidence of pulmonic stenosis. Aorta: The aortic root and ascending aorta are structurally normal, with no evidence of dilitation. Pulmonary Artery: The pulmonary artery is not well seen. Venous: The inferior vena cava is normal in size with greater than 50% respiratory variability, suggesting right atrial pressure of 3 mmHg. IAS/Shunts: The interatrial septum was not well visualized.  LEFT VENTRICLE PLAX 2D LVIDd:         4.36 cm  Diastology LVIDs:         2.65 cm  LV e' lateral:   8.59 cm/s LV PW:         0.83 cm  LV E/e' lateral:  7.6 LV IVS:        0.84 cm  LV e' medial:    5.77 cm/s LVOT diam:     1.90 cm  LV E/e' medial:  11.4 LV SV:         56 LV SV Index:   34 LVOT Area:     2.84 cm  RIGHT VENTRICLE RV Basal diam:  3.96 cm RV S prime:     16.70 cm/s TAPSE (M-mode): 2.4  cm LEFT ATRIUM             Index       RIGHT ATRIUM           Index LA diam:        3.70 cm 2.26 cm/m  RA Area:     12.10 cm LA Vol (A2C):   49.8 ml 30.41 ml/m RA Volume:   31.70 ml  19.36 ml/m LA Vol (A4C):   41.5 ml 25.34 ml/m LA Biplane Vol: 45.9 ml 28.03 ml/m  AORTIC VALVE LVOT Vmax:   103.00 cm/s LVOT Vmean:  71.700 cm/s LVOT VTI:    0.196 m  AORTA Ao Root diam: 3.20 cm Ao Asc diam:  3.20 cm MITRAL VALVE               TRICUSPID VALVE MV Area (PHT): 2.66 cm    TR Peak grad:   24.6 mmHg MV Decel Time: 285 msec    TR Vmax:        248.00 cm/s MV E velocity: 65.60 cm/s MV A velocity: 83.60 cm/s  SHUNTS MV E/A ratio:  0.78        Systemic VTI:  0.20 m                            Systemic Diam: 1.90 cm Nelva Bush MD Electronically signed by Nelva Bush MD Signature Date/Time: 07/25/2020/12:28:15 AM    Final   Scheduled Meds: .  stroke: mapping our early stages of recovery book   Does not apply Once  . sodium chloride   Intravenous Once  . sodium chloride   Intravenous Once  . amLODipine  10 mg Oral Daily  . atorvastatin  80 mg Oral Daily  . Chlorhexidine Gluconate Cloth  6 each Topical Daily  . insulin aspart  0-5 Units Subcutaneous QHS  . insulin aspart  0-9 Units Subcutaneous TID WC  . insulin glargine  12 Units Subcutaneous QHS  . levETIRAcetam  500 mg Oral BID  . sodium chloride flush  3 mL Intravenous Q12H   Continuous Infusions: . sodium chloride    . famotidine (PEPCID) IV Stopped (07/26/20 1149)  . magnesium sulfate bolus IVPB 2 g (07/26/20 1246)     LOS: 2 days    Para Skeans, MD [facility] Pager [phone] If 7PM-7AM, please contact night-coverage [email or web address] Password [email or web address] 07/26/2020, 1:21 PM

## 2020-07-26 NOTE — Progress Notes (Signed)
Physical Therapy Treatment Patient Details Name: Susan Navarro MRN: [id number] DOB: January 23, 1961 Today's Date: 07/26/2020    History of Present Illness Pt is a 59 yo female that presented to ED for confusion, inability to speak. PMH of anemia, CKD, DM, HTN, previous stroke. Did have a fall about 3-4weeks ago that resulted in a broken hip. Per imaging, CT head in the ED concerning for a left frontal infarct with some hemorrhagic transformation. Recent hospital stay 7/7 due to fall and imaging reveals L moderately displaced left superior and inferior pubic rami fractures.    PT Comments    Pt sleeping upon PT entrance, easily woken and agreeable to mobilizing and getting set up for breakfast. The patient was able to perform bed mobility modI and sit EOB for several minutes with good balance, not reliant on UE support. Sit <> stand with supervision with RW, and ambulated ~118f with RW and CGA. With time pt exhibited improved gait velocity, no unsteadiness noted. Pt up in chair, all needs in reach. The patient would benefit from further skilled PT intervention to continue to progress towards goals. Recommendation remains appropriate.       Follow Up Recommendations  Home health PT;Supervision - Intermittent     Equipment Recommendations  None recommended by PT    Recommendations for Other Services       Precautions / Restrictions Precautions Precautions: Fall Precaution Comments: 472wksold L superior and inferior pubic rami fx Restrictions Weight Bearing Restrictions: No    Mobility  Bed Mobility Overal bed mobility: Modified Independent       Supine to sit: Modified independent (Device/Increase time);HOB elevated        Transfers Overall transfer level: Needs assistance Equipment used: Rolling walker (2 wheeled) Transfers: Sit to/from Stand Sit to Stand: Supervision            Ambulation/Gait   Gait Distance (Feet): 130 Feet Assistive device: Rolling walker (2  wheeled)       General Gait Details: improved gait velocity over time, no unsteadiness noted. Reciprocal gait pattern   Stairs             Wheelchair Mobility    Modified Rankin (Stroke Patients Only)       Balance Overall balance assessment: Needs assistance Sitting-balance support: Feet supported Sitting balance-Leahy Scale: Good Sitting balance - Comments: pt able to sit without UE support     Standing balance-Leahy Scale: Fair                              Cognition Arousal/Alertness: Awake/alert Behavior During Therapy: WFL for tasks assessed/performed Overall Cognitive Status: No family/caregiver present to determine baseline cognitive functioning                                 General Comments: Generally oriented to self, is noted to have some difficulty with word finding/expression though improved from yesterday. Will continue to assess.      Exercises Other Exercises Other Exercises: Pt educated on seated exercises she can perform throughout the day (hip flexion, knee extension, heel/toe raises) pt verbalized understanding.    General Comments        Pertinent Vitals/Pain Pain Assessment: Faces Faces Pain Scale: No hurt    Home Living  Prior Function            PT Goals (current goals can now be found in the care plan section) Progress towards PT goals: Progressing toward goals    Frequency    7X/week      PT Plan Current plan remains appropriate    Co-evaluation              AM-PAC PT "6 Clicks" Mobility   Outcome Measure  Help needed turning from your back to your side while in a flat bed without using bedrails?: A Little Help needed moving from lying on your back to sitting on the side of a flat bed without using bedrails?: A Little Help needed moving to and from a bed to a chair (including a wheelchair)?: A Little Help needed standing up from a chair using your  arms (e.g., wheelchair or bedside chair)?: A Little Help needed to walk in hospital room?: A Little Help needed climbing 3-5 steps with a railing? : A Little 6 Click Score: 18    End of Session Equipment Utilized During Treatment: Gait belt Activity Tolerance: Patient tolerated treatment well Patient left: in chair;with call bell/phone within reach Nurse Communication: Mobility status PT Visit Diagnosis: Other abnormalities of gait and mobility (R26.89);Muscle weakness (generalized) (M62.81);Difficulty in walking, not elsewhere classified (R26.2);Pain Pain - Right/Left: Left Pain - part of body: Hip     Time: 6438-3779 PT Time Calculation (min) (ACUTE ONLY): 24 min  Charges:  $Therapeutic Exercise: 23-37 mins                     Lieutenant Diego PT, DPT 9:58 AM,07/26/20

## 2020-07-26 NOTE — Progress Notes (Signed)
Neurology Progress Note  IDENTIFYING INFORMATION  Lorrain Rivers MR# [id number] 07/26/2020  HISTORY OF PRESENT ILLNESS  Susan Navarro is a 59 y.o. female  has a past medical history of Anemia, Chronic kidney disease, Diabetes mellitus without complication ([place]), Hypertension, and Stroke ([place]) (03/2018) who presents with episode of confusion and starring off. 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  INTERVAL HISTORY  She is awake, ate her breakfast, no complaints at this time. Discussed need for repeat platelet transfusion with her and with her daughter Loria Lacina on the phone and they are both okay with it.  MEDICATIONS    Current Facility-Administered Medications (Endocrine & Metabolic):  .  insulin aspart (novoLOG) injection 0-5 Units .  insulin aspart (novoLOG) injection 0-9 Units   Current Facility-Administered Medications (Cardiovascular):  .  [COMPLETED] labetalol (NORMODYNE) injection 20 mg **AND** nicardipine (CARDENE) 48m in 0.86% saline 2038mIV infusion (0.1 mg/ml)       Current Facility-Administered Medications (Analgesics):  .  acetaminophen (TYLENOL) tablet 650 mg     Current Facility-Administered Medications (Other):  .   stroke: mapping our early stages of recovery book .  0.9 %  sodium chloride infusion .  Chlorhexidine Gluconate Cloth 2 % PADS 6 each .  docusate sodium (COLACE) capsule 100 mg .  famotidine (PEPCID) IVPB 20 mg premix .  gadobutrol (GADAVIST) 1 MMOL/ML injection 7.5 mL .  levETIRAcetam (KEPPRA) tablet 500 mg .  ondansetron (ZOFRAN) injection 4 mg .  polyethylene glycol (MIRALAX / GLYCOLAX) packet 17 g .  sodium chloride flush (NS) 0.9 % injection 3 mL .  sodium chloride flush (NS) 0.9 % injection 3 mL  No current outpatient medications on file.  VITAL SIGNS  Temp:  [98.1 F (36.7 C)-98.6 F (37 C)] 98.2 F (36.8 C) (08/05 0713) Pulse Rate:  [83-108] 97 (08/05 0713) Resp:  [8-24] 15 (08/05 0713) BP: (98-167)/(7-95) 167/95 (08/05  0713) SpO2:  [95 %-100 %] 100 % (08/05 0713)  PHYSICAL EXAM   General Physical Exam  General: NAD, lying comfortably in bed HENT: Normal oropharynx and mucosa. Normal external appearance of ears and nose. CV/Chest: Regular rate and rhythm, normal S1S2 Lungs: No audible wheezing. Normal work of breathing. No accessory muscle use Abdomen: Non distended, non tender Extremities: Warm and well perfused. No appreciable edema, cyanosis or deformity. Skin: No rash. Normal palpation of skin.   Musculoskeletal: No joint tenderness. Normal digits and nails by inspection. No clubbing.  Neurologic Examination  Mental status/Cognition: alert; oriented to self but not to month; poor attention; no apparent neglect Speech/language: fluent; comprehension intact; object naming intact inermittently, repetition intact. Cranial nerves:   CN II Visual fields full to confrontation   CN III,IV,VI PERRL. EOMI.   CN V Facial sensation intact to light touch bilaterally   CN VII Face, Smile, Eyebrow raise/closure symmetric.   CN VIII Hearing grossly intact to voice   CN IX & X Soft palate elevates symmetrically in the midline, no dysarthria   CN XI Shoulder shrug with full strength   CN XII Tongue protrudes midline    Motor: Normal bulk and tone. All extremities antigravity without drift *RLE is painful due to recent fracture.  Sensation: intact to light touch throughout without extinction  Coordination/Complex Motor:  - Finger-to-nose with intention tremors.  IMAGING/DIAGNOSTIC STUDIES  MRI [place]rain without contrast: Motion degraded exam. Moderate-sized region of predominantly vasogenic edema within the anterolateral left frontal lobe. Within this region, and abutting the cortical surface, there is a 1.7 cm  circumscribed focus of T2 intermediate signal. There is no appreciable restricted diffusion at this site and there is unchanged overlying small-volume subarachnoid hemorrhage. Findings are  suspicious for a possible underlying mass and post-contrast MR imaging of the brain is recommended for further evaluation. Alternatively, these findings may reflect a late subacute infarct with hemorrhagic conversion or parenchymal hematoma with surrounding edema.  Multifocal chronic cortically based infarcts within the bilateral cerebral hemispheres as described. Chronic lacunar infarcts within the right basal ganglia, left thalamus and bilateral cerebellar hemispheres.  Background mild chronic small vessel ischemic disease within the cerebral Pancoast matter and pons.  Mild generalized parenchymal atrophy.  Left sphenoid sinusitis.   MRI Brain with contrast: 1. Redemonstration of left frontal infarct with cortical laminar necrosis. No masslike enhancement to suggest focal lesion. 2. Sequela of remote bilateral cerebral, bilateral cerebellar, right basal ganglia and left thalamic infarcts. Right frontal cortical laminar necrosis. 3. Chronic microvascular ischemic changes.  Mild cerebral atrophy.  REEG: This study is suggestive of moderate diffuse encephalopathy, nonspecific etiology. No seizures or epileptiform discharges were seen throughout the recording.   Lab Results  Component Value Date   HGBA1C 9.5 (H) 07/24/2020   Lab Results  Component Value Date   LDLCALC 19 07/24/2020     ASSESSMENT AND PLAN  Impression and plan: Cooper Stamp is a 59 y.o. female with PMH significant for poorly controlled diabetes, hypertension, hyperlipidemia, prior strokes which patient is not aware of who presents with confusion over the last few days and episodes of staring off.  CT head in the ED concerning for a left frontal infarct with some hemorrhagic transformation.  Repeat CT head shows some questionable progression of the hemorrhage.  Neuro exam with mild aphasia out of proportion to encephalopathy but no other focal deficit. She was also noted to have high blood glucose level  which was elevated to 474 at presentation.  Impression: Left frontal ischemic infarct with hemorrhagic transformation Suspect focal seizures with retained awareness.  Plan: - Can liberalize SBP goal to < 160. - I ordered SCDs, hold off on Aspirin and DVT prophylaxis given platelet count is below 100K. - I ordered platelet transfusion x 2units  given platelet levels this AM were 77. - Repeat CTH in 24 hours was completed and shows- stable ICH. - NIHSS per protocol - Platelets>100K, INR<1.4 - Brain imaging- MRI Brain w/o C with vasogenic edema and 1.7cm circumscribed T2 intermediate signal abnormality. MRI with contrast with no mass like lesion, no enhancement. - Vascular imaging- CTA Brain/Neck with no aneurysm, no LVO. - TTE - EF of 60-65% - Lipid panel with LDL of 19 - Statin - Continue home Atorvastatin 47m daily. - A1C - 9.5 - Telemetry monitoring for arrythmia- ordered - Swallow screen passed - Stroke education - ordered - PT/OT/SLP consulted.  Suspected partial seizures with retained awareness: Daughter endorses patient having episodes of starring off lasting about a minute over the last couple of days. Given new ICH, suspect this could be seizures. My clinical suspicion is high enough that I started her on anti seizure medication and will continue that for now. - Started on Keppra 5022mBID - routine EEG - completed with no epileptogenic abnormalities. - Seizure precautions - Will need to follow up with neurology outpatient for seizure management.  Signed,  [facility] Number [phone]

## 2020-07-26 NOTE — TOC Initial Note (Signed)
Transition of Care [facility]) - Initial/Assessment Note    Patient Details  Name: Susan Navarro MRN: [id number] Date of Birth: September 21, 1961  Transition of [facility]) CM/SW Contact:    Candie Chroman, LCSW Phone Number: 07/26/2020, 12:43 PM  Clinical Narrative:  Readmission prevention screen complete. CSW introduced role and explained that discharge planning would be discussed. Patient said her PCP is no longer Dr. Elnoria Howard. She cannot remember the name of the new provider/office. Family takes her to appts. Pharmacy is Paediatric nurse in [place] ([employer]). She said she prefers her medications to be sent to [facility] in [facility] but chart shows preference for [facility] in [place]. She may have gotten the two mixed up. She reports no issues obtaining medications. Patient was active with [facility] for home health PT prior to admission. She is agreeable to adding home health OT. Left message for [facility] representative to see if they could add that. Patient was sent home with a rolling walker and bedside commode in early July. No further concerns. CSW encouraged patient to contact CSW as needed. CSW will continue to follow patient for support and facilitate return home when stable.             Expected Discharge Plan: [place] Barriers to Discharge: Continued Medical Work up   Patient Goals and CMS Choice     Choice offered to / list presented to : NA  Expected Discharge Plan and Services Expected Discharge Plan: [place] Choice: Resumption of Svcs/PTA Provider Living arrangements for the past 2 months: Apartment                                      Prior Living Arrangements/Services Living arrangements for the past 2 months: Apartment Lives with:: Self Patient language and need for interpreter reviewed:: Yes Do you feel safe going back to the place where you live?: Yes      Need for Family Participation in Patient  Care: Yes (Comment)   Current home services: DME, Home PT Criminal Activity/Legal Involvement Pertinent to Current Situation/Hospitalization: No - Comment as needed  Activities of Daily Living      Permission Sought/Granted Permission sought to share information with : Facility Art therapist granted to share information with : Yes, Verbal Permission Granted     Permission granted to share info w AGENCY: [facility]        Emotional Assessment Appearance:: Appears stated age Attitude/Demeanor/Rapport: Engaged, Gracious Affect (typically observed): Accepting, Appropriate, Calm, Pleasant Orientation: : Oriented to Self, Oriented to Place, Oriented to  Time, Oriented to Situation Alcohol / Substance Use: Not Applicable Psych Involvement: No (comment)  Admission diagnosis:  Hemorrhagic stroke ([place]) [I61.9] ICH (intracerebral hemorrhage) (HCC) [I61.9] SAH (subarachnoid hemorrhage) (HCC) [I60.9] Hyperglycemia [R73.9] AKI (acute kidney injury) ([place]) [N17.9] Altered mental status, unspecified altered mental status type [R41.82] Cerebrovascular accident (CVA), unspecified mechanism ([place]) [I63.9] Patient Active Problem List   Diagnosis Date Noted  . ICH (intracerebral hemorrhage) ([employer]) 07/24/2020  . SAH (subarachnoid hemorrhage) ([place]) 07/24/2020  . Fall 06/27/2020  . HTN (hypertension) 02/16/2020  . CKD (chronic kidney disease), stage IV ([place]) 12/30/2019  . Other cirrhosis of liver ([place]) 12/30/2019  . Thrombocytopenia ([place]) 12/30/2019  . 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 Transaminitis 12/30/2019  . Malnutrition of moderate degree 12/28/2019  .  Anemia of chronic disease   . Generalized weakness   . Altered mental status   . DKA (diabetic ketoacidoses) ([place]) 12/25/2019  . Hyponatremia   . Hyperkalemia   . Stroke ([place]) 03/2018   PCP:  Rochel Brome, MD Pharmacy:   [facility], [place] [address] [place] [address] Phone: [phone] Fax: [phone]  [place], [place] [address] Phone: [phone] Fax: [phone]     Social Determinants of Health (SDOH) Interventions    Readmission Risk Interventions Readmission Risk Prevention Plan 07/26/2020 12/28/2019  Transportation Screening Complete Complete  HRI or Home Care Consult Complete Complete  Social Work Consult for [place] Planning/Counseling Complete -  [place] Screening Not Applicable Not Applicable  Medication Review Press photographer) Complete Referral to Pharmacy  Some recent data might be hidden

## 2020-07-27 ENCOUNTER — Other Ambulatory Visit: Payer: Self-pay

## 2020-07-27 DIAGNOSIS — D696 Thrombocytopenia, unspecified: Secondary | ICD-10-CM

## 2020-07-27 LAB — GLUCOSE, CAPILLARY
Glucose-Capillary: 166 mg/dL — ABNORMAL HIGH (ref 70–99)
Glucose-Capillary: 262 mg/dL — ABNORMAL HIGH (ref 70–99)
Glucose-Capillary: 191 mg/dL — ABNORMAL HIGH (ref 70–99)
Glucose-Capillary: 234 mg/dL — ABNORMAL HIGH (ref 70–99)

## 2020-07-27 LAB — CBC WITH DIFFERENTIAL/PLATELET
Abs Immature Granulocytes: 0.01 10*3/uL (ref 0.00–0.07)
Basophils Absolute: 0 10*3/uL (ref 0.0–0.1)
Basophils Relative: 0 %
Eosinophils Absolute: 0.1 10*3/uL (ref 0.0–0.5)
Eosinophils Relative: 4 %
HCT: 29.6 % — ABNORMAL LOW (ref 36.0–46.0)
Hemoglobin: 10.2 g/dL — ABNORMAL LOW (ref 12.0–15.0)
Immature Granulocytes: 0 %
Lymphocytes Relative: 32 %
Lymphs Abs: 1 10*3/uL (ref 0.7–4.0)
MCH: 31.7 pg (ref 26.0–34.0)
MCHC: 34.5 g/dL (ref 30.0–36.0)
MCV: 91.9 fL (ref 80.0–100.0)
Monocytes Absolute: 0.3 10*3/uL (ref 0.1–1.0)
Monocytes Relative: 10 %
Neutro Abs: 1.7 10*3/uL (ref 1.7–7.7)
Neutrophils Relative %: 54 %
Platelets: 96 10*3/uL — ABNORMAL LOW (ref 150–400)
RBC: 3.22 MIL/uL — ABNORMAL LOW (ref 3.87–5.11)
RDW: 13.7 % (ref 11.5–15.5)
WBC: 3.2 10*3/uL — ABNORMAL LOW (ref 4.0–10.5)
nRBC: 0 % (ref 0.0–0.2)

## 2020-07-27 LAB — PATHOLOGIST SMEAR REVIEW

## 2020-07-27 LAB — PREPARE PLATELET PHERESIS: Unit division: 0

## 2020-07-27 LAB — BASIC METABOLIC PANEL
Anion gap: 8 (ref 5–15)
BUN: 30 mg/dL — ABNORMAL HIGH (ref 6–20)
CO2: 27 mmol/L (ref 22–32)
Calcium: 9 mg/dL (ref 8.9–10.3)
Chloride: 103 mmol/L (ref 98–111)
Creatinine, Ser: 1.56 mg/dL — ABNORMAL HIGH (ref 0.44–1.00)
GFR calc Af Amer: 41 mL/min — ABNORMAL LOW (ref >60)
GFR calc non Af Amer: 36 mL/min — ABNORMAL LOW (ref >60)
Glucose, Bld: 191 mg/dL — ABNORMAL HIGH (ref 70–99)
Potassium: 4.3 mmol/L (ref 3.5–5.1)
Sodium: 138 mmol/L (ref 135–145)

## 2020-07-27 LAB — BPAM PLATELET PHERESIS
Blood Product Expiration Date: 202108072359
ISSUE DATE / TIME: 202108050649
Unit Type and Rh: 8400

## 2020-07-27 LAB — MAGNESIUM: Magnesium: 1.8 mg/dL (ref 1.7–2.4)

## 2020-07-27 MED ORDER — FAMOTIDINE 20 MG PO TABS
20.0000 mg | ORAL_TABLET | Freq: Every day | ORAL | Status: DC
  Administered 2020-07-27 – 2020-07-29 (×3): 20 mg via ORAL
  Filled 2020-07-27 (×3): qty 1

## 2020-07-27 NOTE — Consult Note (Signed)
[facility]  Date of admission:  07/24/2020  Inpatient day:  07/27/2020  Consulting physician: Dr Florina Ou   Reason for Consultation:  Thrombocytopenia.  Chief Complaint: Susan Navarro is a 59 y.o. female with a history of x who was admitted through the emergency room with a left frontal hemorrhagic stroke.  HPI:   The patient fractured her left pelvis on 06/27/2020.  She had a moderately displaced left superior and inferior pubic ramus fracture.  She did not require surgical intervention.  She did not require surgical intervention.  She was in rehab and was discharged approximately 1 week ago.  She presented to the [facility] ER with hyperglycemia and confusion over several days.  Head CT on 07/24/2020 revealed a new area of low attenuation in the left frontal region with associated sulcal effacement and focal hyperdense material suggesting hemorrhage.    Head MRI on 07/24/2020 revealed a moderate size region of predominantly vasogenic edema within the anterolateral left frontal lobe there was a 1.7 cm circumscribed focus of T2 intermediate signal.  There is multifocal chronic cortically based infarcts within the hemispheres chronic lacunar infarcts in the right basal ganglia thalamus and cerebellar hemispheres.    Head MRI on 07/25/2020 confirmed a left frontal infarct with cortical laminar necrosis no masslike enhancement to suggest focal lesion.  Sequela of remote bilateral cerebral, bilateral cerebellar, right basal ganglia and left thalamic infarcts and right frontal cortical laminal necrosis.  CTA of the head and neck were normal.  Patient was seen by neurology.  Systolic blood pressure was to be maintained less than 140.  He was not to receive aspirin or heparin.  Platelets were to be maintained greater than 100,000 and INR less than 1.4.   Platelet count has been monitored: 100,000 on 07/24/2020, 88,000 on 07/25/2020, 77,000 on 07/26/2020, 116,000 on 07/26/2020 and 96,000  on 07/27/2020.  She received a platelet transfusion on 07/25/2020 and 07/26/2020.  The patient denies any past history of thrombocytopenia.  Review of labs dating back to 2013 confirm a waxing and waning thrombocytopenia typically ranging between 68,000 - 112,000  (max 164,000).  Review of entire medical records indicate pancytopenia dating back to at least 2006.  He was seen by Dr. Lonia Chimera in hematology at [facility] on 03/28/2015.  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  Her anemia was felt possibly due to occult blood loss, low erythropoietin level secondary to anemia of chronic renal disease or an underlying bone marrow disorder.  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  At that time, hepatitis B, hepatitis C and HIV testing were negative.  RUQ ultrasound on 12/28/2019 revealed a heterogeneous appearance of the hepatic parenchyma with coarsened echotexture and slightly nodular surface contour suggesting cirrhosis.  Prior to her admission, last CBC on 03/30/2020 included a hematocrit 36.1, hemoglobin 11.7, platelets 101,000, WBC 3700 with an ANC of 1700.  Symptomatically, the patient denies any B symptoms.  She denies residual side effects of her CVA.  She denies any pica.  She has never had hepatitis.  She denies any alcohol or herbal products.  She does not drink quinine water.  She has had no issues with infection.  Diet is modest.   Past Medical History:  Diagnosis Date  . Anemia   . Chronic kidney disease   . Diabetes mellitus without complication ([place])   . Hypertension   . Stroke [facility]) 03/2018   Ischemic stroke  per [facility] records    History reviewed. No pertinent surgical history.  Family History  Problem Relation Age of Onset  . Diabetes Mother   . Diabetes Brother     Social History:  reports that she has never smoked. She has never used smokeless tobacco. She  reports that she does not drink alcohol and does not use drugs.  Patient denies any alcohol or tobacco.  The patient denies any exposure to radiation or toxins.  The patient lives in [place].  She has been disabled because of diabetes.  She is accompanied by her twin sister and daughter.  Allergies: No Known Allergies  Medications Prior to Admission  Medication Sig Dispense Refill  . amLODipine (NORVASC) 5 MG tablet Take 5 mg by mouth daily.    Marland Kitchen atorvastatin (LIPITOR) 80 MG tablet Take 80 mg by mouth daily.    . Ensure Max Protein (ENSURE MAX PROTEIN) LIQD Take 330 mLs (11 oz total) by mouth 2 (two) times daily between meals. 330 mL 12  . Insulin Glargine (LANTUS SOLOSTAR) 100 UNIT/ML Solostar Pen Inject 15 Units into the skin daily. (Patient taking differently: Inject 22 Units into the skin daily. ) 15 mL 0  . insulin lispro (HUMALOG KWIKPEN) 100 UNIT/ML KwikPen Inject 0.02-0.03 mLs (2-3 Units total) into the skin 3 (three) times daily before meals. 15 mL 0  . Insulin Syringe-Needle U-100 (HEALTHWISE INSULIN SYR/NEEDLE) 30G X 5/16" 0.5 ML MISC 1 each by Does not apply route 3 (three) times daily with meals. 50 each 0  . lidocaine (LIDODERM) 5 % Place 1 patch onto the skin daily. Remove & Discard patch within 12 hours or as directed by MD 14 patch 0  . lisinopril (ZESTRIL) 2.5 MG tablet Take 2.5 mg by mouth every evening.     . Multiple Vitamin (MULTIVITAMIN WITH MINERALS) TABS tablet Take 1 tablet by mouth daily. 30 tablet 0    Review of Systems: GENERAL:  Feels "fine".  No fevers, sweats or weight loss. PERFORMANCE STATUS (ECOG):  1-2 HEENT:  No visual changes, runny nose, sore throat, mouth sores or tenderness. Lungs: No shortness of breath or cough.  No hemoptysis. Cardiac:  No chest pain, palpitations, orthopnea, or PND. GI:  No nausea, vomiting, diarrhea, constipation, melena or hematochezia.  No ice pica. GU:  No urgency, frequency, dysuria, or hematuria. Musculoskeletal:  No back  pain.  Arthritis.  No muscle tenderness. Extremities:  No pain or swelling. Skin:  No rashes or skin changes. Neuro:  S/p CVA.  No headache, numbness or weakness, balance or coordination issues. Endocrine:  Diabetes.  No thyroid issues, hot flashes or night sweats. Psych:  No mood changes, depression or anxiety. Pain:  No focal pain. Review of systems:  All other systems reviewed and found to be negative.  Physical Exam:  Blood pressure 133/77, pulse (!) 107, temperature 98.5 F (36.9 C), temperature source Oral, resp. rate 18, height 5' 6"  (1.676 m), weight 131 lb 9.8 oz (59.7 kg), SpO2 100 %.  GENERAL:  Elderly woman sitting comfortably on the medical unit in no acute distress. MENTAL STATUS:  Alert and oriented to person, place and time. HEAD:  Wearing a cap.  Normocephalic, atraumatic, face symmetric, no Cushingoid features. EYES:  Brown eyes.  Pupils equal round and reactive to light and accomodation.  No conjunctivitis or scleral icterus. ENT:  Oropharynx clear without lesion.  Tongue normal. Mucous membranes moist.  RESPIRATORY:  Clear to auscultation without rales, wheezes or rhonchi. CARDIOVASCULAR:  Regular  rate and rhythm without murmur, rub or gallop. ABDOMEN:  Soft, non-tender, with active bowel sounds, and no appreciable hepatosplenomegaly.  No masses. SKIN:  No rashes, ulcers or lesions. EXTREMITIES: No edema, no skin discoloration or tenderness.  No palpable cords. LYMPH NODES: No palpable cervical, supraclavicular, axillary or inguinal adenopathy  NEUROLOGICAL: Unremarkable. PSYCH:  Appropriate.   Results for orders placed or performed during the hospital encounter of 07/24/20 (from the past 48 hour(s))  Glucose, capillary     Status: Abnormal   Collection Time: 07/25/20  9:33 PM  Result Value Ref Range   Glucose-Capillary 259 (H) 70 - 99 mg/dL    Comment: Glucose reference range applies only to samples taken after fasting for at least 8 hours.  Basic metabolic panel      Status: Abnormal   Collection Time: 07/26/20  4:37 AM  Result Value Ref Range   Sodium 139 135 - 145 mmol/L   Potassium 4.7 3.5 - 5.1 mmol/L   Chloride 106 98 - 111 mmol/L   CO2 26 22 - 32 mmol/L   Glucose, Bld 242 (H) 70 - 99 mg/dL    Comment: Glucose reference range applies only to samples taken after fasting for at least 8 hours.   BUN 33 (H) 6 - 20 mg/dL   Creatinine, Ser 1.46 (H) 0.44 - 1.00 mg/dL   Calcium 9.0 8.9 - 10.3 mg/dL   GFR calc non Af Amer 39 (L) >60 mL/min   GFR calc Af Amer 45 (L) >60 mL/min   Anion gap 7 5 - 15    Comment: Performed at [facility], [address] [place], [place] [address]  Magnesium     Status: Abnormal   Collection Time: 07/26/20  4:37 AM  Result Value Ref Range   Magnesium 1.6 (L) 1.7 - 2.4 mg/dL    Comment: Performed at [facility], [address] [place], [place] [address]  Phosphorus     Status: None   Collection Time: 07/26/20  4:37 AM  Result Value Ref Range   Phosphorus 2.8 2.5 - 4.6 mg/dL    Comment: Performed at [facility], [address] [place], [place] [address]  CBC with Differential/Platelet     Status: Abnormal   Collection Time: 07/26/20  4:37 AM  Result Value Ref Range   WBC 3.0 (L) 4.0 - 10.5 K/uL   RBC 3.09 (L) 3.87 - 5.11 MIL/uL   Hemoglobin 9.7 (L) 12.0 - 15.0 g/dL   HCT 28.4 (L) 36 - 46 %   MCV 91.9 80.0 - 100.0 fL   MCH 31.4 26.0 - 34.0 pg   MCHC 34.2 30.0 - 36.0 g/dL   RDW 13.7 11.5 - 15.5 %   Platelets 77 (L) 150 - 400 K/uL    Comment: Immature Platelet Fraction may be clinically indicated, consider ordering this additional test [id number]    nRBC 0.0 0.0 - 0.2 %   Neutrophils Relative % 56 %   Neutro Abs 1.6 (L) 1.7 - 7.7 K/uL   Lymphocytes Relative 32 %   Lymphs Abs 1.0 0.7 - 4.0 K/uL   Monocytes Relative 8 %   Monocytes Absolute 0.3 0 - 1 K/uL   Eosinophils Relative 4 %   Eosinophils Absolute 0.1 0 - 0 K/uL   Basophils Relative 0 %   Basophils Absolute 0.0 0 -  0 K/uL   Immature Granulocytes 0 %   Abs Immature Granulocytes 0.01 0.00 - 0.07 K/uL    Comment: Performed  at [facility], [address] [place], [place] [address]  Prepare Pheresed Platelets     Status: None   Collection Time: 07/26/20  6:17 AM  Result Value Ref Range   Unit Number [id number]    Blood Component Type PLTP2 PSORALEN TREATED    Unit division 00    Status of Unit ISSUED,FINAL    Transfusion Status      OK TO TRANSFUSE Performed at [facility], [address] [place], [place] [address]   Glucose, capillary     Status: Abnormal   Collection Time: 07/26/20  7:21 AM  Result Value Ref Range   Glucose-Capillary 318 (H) 70 - 99 mg/dL    Comment: Glucose reference range applies only to samples taken after fasting for at least 8 hours.  CBC with Differential/Platelet     Status: Abnormal   Collection Time: 07/26/20 12:21 PM  Result Value Ref Range   WBC 3.2 (L) 4.0 - 10.5 K/uL   RBC 3.57 (L) 3.87 - 5.11 MIL/uL   Hemoglobin 11.0 (L) 12.0 - 15.0 g/dL   HCT 33.2 (L) 36 - 46 %   MCV 93.0 80.0 - 100.0 fL   MCH 30.8 26.0 - 34.0 pg   MCHC 33.1 30.0 - 36.0 g/dL   RDW 13.7 11.5 - 15.5 %   Platelets 116 (L) 150 - 400 K/uL    Comment: Immature Platelet Fraction may be clinically indicated, consider ordering this additional test [id number]    nRBC 0.0 0.0 - 0.2 %   Neutrophils Relative % 71 %   Neutro Abs 2.3 1.7 - 7.7 K/uL   Lymphocytes Relative 18 %   Lymphs Abs 0.6 (L) 0.7 - 4.0 K/uL   Monocytes Relative 9 %   Monocytes Absolute 0.3 0 - 1 K/uL   Eosinophils Relative 2 %   Eosinophils Absolute 0.1 0 - 0 K/uL   Basophils Relative 0 %   Basophils Absolute 0.0 0 - 0 K/uL   Immature Granulocytes 0 %   Abs Immature Granulocytes 0.01 0.00 - 0.07 K/uL    Comment: Performed at [facility], [address] [place], [place] [address]  Glucose, capillary     Status: Abnormal   Collection Time: 07/26/20 12:21 PM  Result Value Ref Range    Glucose-Capillary 398 (H) 70 - 99 mg/dL    Comment: Glucose reference range applies only to samples taken after fasting for at least 8 hours.  Glucose, capillary     Status: Abnormal   Collection Time: 07/26/20  5:58 PM  Result Value Ref Range   Glucose-Capillary 436 (H) 70 - 99 mg/dL    Comment: Glucose reference range applies only to samples taken after fasting for at least 8 hours.  Glucose, capillary     Status: Abnormal   Collection Time: 07/26/20  9:16 PM  Result Value Ref Range   Glucose-Capillary 388 (H) 70 - 99 mg/dL    Comment: Glucose reference range applies only to samples taken after fasting for at least 8 hours.  Glucose, capillary     Status: Abnormal   Collection Time: 07/27/20  7:42 AM  Result Value Ref Range   Glucose-Capillary 166 (H) 70 - 99 mg/dL    Comment: Glucose reference range applies only to samples taken after fasting for at least 8 hours.  Basic metabolic panel     Status: Abnormal   Collection Time: 07/27/20  8:05 AM  Result Value Ref Range   Sodium 138 135 -  145 mmol/L   Potassium 4.3 3.5 - 5.1 mmol/L   Chloride 103 98 - 111 mmol/L   CO2 27 22 - 32 mmol/L   Glucose, Bld 191 (H) 70 - 99 mg/dL    Comment: Glucose reference range applies only to samples taken after fasting for at least 8 hours.   BUN 30 (H) 6 - 20 mg/dL   Creatinine, Ser 1.56 (H) 0.44 - 1.00 mg/dL   Calcium 9.0 8.9 - 10.3 mg/dL   GFR calc non Af Amer 36 (L) >60 mL/min   GFR calc Af Amer 41 (L) >60 mL/min   Anion gap 8 5 - 15    Comment: Performed at [facility], [address] [place], [place] [address]  Magnesium     Status: None   Collection Time: 07/27/20  8:05 AM  Result Value Ref Range   Magnesium 1.8 1.7 - 2.4 mg/dL    Comment: Performed at [facility], [address] [place], [place] [address]  CBC with Differential/Platelet     Status: Abnormal   Collection Time: 07/27/20  8:05 AM  Result Value Ref Range   WBC 3.2 (L) 4.0 - 10.5 K/uL   RBC 3.22 (L)  3.87 - 5.11 MIL/uL   Hemoglobin 10.2 (L) 12.0 - 15.0 g/dL   HCT 29.6 (L) 36 - 46 %   MCV 91.9 80.0 - 100.0 fL   MCH 31.7 26.0 - 34.0 pg   MCHC 34.5 30.0 - 36.0 g/dL   RDW 13.7 11.5 - 15.5 %   Platelets 96 (L) 150 - 400 K/uL    Comment: PLATELET COUNT CONFIRMED BY SMEAR   nRBC 0.0 0.0 - 0.2 %   Neutrophils Relative % 54 %   Neutro Abs 1.7 1.7 - 7.7 K/uL   Lymphocytes Relative 32 %   Lymphs Abs 1.0 0.7 - 4.0 K/uL   Monocytes Relative 10 %   Monocytes Absolute 0.3 0 - 1 K/uL   Eosinophils Relative 4 %   Eosinophils Absolute 0.1 0 - 0 K/uL   Basophils Relative 0 %   Basophils Absolute 0.0 0 - 0 K/uL   Immature Granulocytes 0 %   Abs Immature Granulocytes 0.01 0.00 - 0.07 K/uL    Comment: Performed at [facility], [address], [place], [place] [address]  Pathologist smear review     Status: None   Collection Time: 07/27/20  8:05 AM  Result Value Ref Range   Path Review Blood smear is reviewed.     Comment: Patient presents with altered mental status. Pancytopenia.  Morphology of RBCs, WBCs, and platelets within normal limits. No evidence of circulating blasts or schistocytes. Reviewed by Elmon Kirschner, M.D. Performed at [facility], [address] [place], [place] [address]   Glucose, capillary     Status: Abnormal   Collection Time: 07/27/20 11:43 AM  Result Value Ref Range   Glucose-Capillary 262 (H) 70 - 99 mg/dL    Comment: Glucose reference range applies only to samples taken after fasting for at least 8 hours.  Glucose, capillary     Status: Abnormal   Collection Time: 07/27/20  4:00 PM  Result Value Ref Range   Glucose-Capillary 191 (H) 70 - 99 mg/dL    Comment: Glucose reference range applies only to samples taken after fasting for at least 8 hours.   No results found.  Assessment:  The patient is a 59 y.o. woman with chronic thrombocytopenia dating back to 2006.  Platelet  count has typically ranged between 68,000 - 112,000.  Peripheral  smear on 07/27/2020 revealed pancytopenia.  The morphology of the RBCs, WBCs and platelet count platelets were within normal limits.  There is no evidence of circulating blasts or schistocytes.    Labs in 12/2019 included negative ANA and HIV.  Extensive work-up for pancytopenia in the past was felt due to cirrhosis and hypersplenism.  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  She was admitted with an acute intracerebral hemorrhage associated with stroke.    She has chronic kidney disease.  CrCl is 35.9 ml/min.  Symptomatically, she denies any neurologic complaints.  Diet is modest.  Exam in the seated position when it feels no appreciable hepatosplenomegaly.  Plan:   1.   Thrombocytopenia  Patient has a long history of thrombocytopenia.  Etiology is likely felt secondary to cirrhosis and hypersplenism.   Abdominal ultrasound to assess liver and spleen.  She received 2 platelet transfusions with an appropriate increase in platelet count and thus not suggestive of an autoimmune process.  Diet is poor.  Check B12, folate and TSH.  PT and PTT.  Hepatitis B and C serologies.  H pylori serologies.  Check platelet count in a blue top tube to r/o pseudothrombocytopenia.  Peripheral smear for review.  Daily CBC. 2.   Intracerebral hemorrhage  Patient's platelet goal is 100,000 per neurology.  Transfuse platelets as needed per neurology.  Patient is no longer on aspirin.  Thank you for allowing me to participate in Feleshia Zundel 's care.  I will follow her closely with you while hospitalized and after discharge in the outpatient department.   Lequita Asal, MD  07/27/2020, 8:45 PM

## 2020-07-27 NOTE — Care Management Important Message (Signed)
Important Message  Patient Details  Name: Susan Navarro MRN: [id number] Date of Birth: [birth year]   Medicare Important Message Given:  Yes     Juliann Pulse A Wayburn Shaler 07/27/2020, 1:56 PM

## 2020-07-27 NOTE — Progress Notes (Signed)
Dr Posey Pronto made aware that pts HR has been consistently in the low to mid 100s, acknowledged, no new orders at this time

## 2020-07-27 NOTE — Progress Notes (Addendum)
Neurology Progress Note  IDENTIFYING INFORMATION  Susan Navarro MR# [id number] 07/27/2020  HISTORY OF PRESENT ILLNESS  Susan Navarro is a 59 y.o. female  has a past medical history of Anemia, Chronic kidney disease, Diabetes mellitus without complication ([place]), Hypertension, and Stroke ([place]) (03/2018) who presents with episode of confusion and starring off. 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  INTERVAL HISTORY  No acute events overnight. Awake, alert, eating her lunch today. I discussed her low blood counts with her today and if she has ever had a workup for that. She is not aware of any workup that she has had. She does not take any over the counter blood thinner. I discussed with her that she should avoid blood thinners.  I do not see any workup for the noted pancytopenia in her chart. Her PCP did mention potential referral to hematology if counts did not improve in notes from 2020.  MEDICATIONS    Current Facility-Administered Medications (Endocrine & Metabolic):  .  insulin aspart (novoLOG) injection 0-5 Units .  insulin aspart (novoLOG) injection 0-9 Units   Current Facility-Administered Medications (Cardiovascular):  .  [COMPLETED] labetalol (NORMODYNE) injection 20 mg **AND** nicardipine (CARDENE) 14m in 0.86% saline 206mIV infusion (0.1 mg/ml)       Current Facility-Administered Medications (Analgesics):  .  acetaminophen (TYLENOL) tablet 650 mg     Current Facility-Administered Medications (Other):  .   stroke: mapping our early stages of recovery book .  0.9 %  sodium chloride infusion .  Chlorhexidine Gluconate Cloth 2 % PADS 6 each .  docusate sodium (COLACE) capsule 100 mg .  famotidine (PEPCID) IVPB 20 mg premix .  gadobutrol (GADAVIST) 1 MMOL/ML injection 7.5 mL .  levETIRAcetam (KEPPRA) tablet 500 mg .  ondansetron (ZOFRAN) injection 4 mg .  polyethylene glycol (MIRALAX / GLYCOLAX) packet 17 g .  sodium chloride flush (NS) 0.9 % injection 3 mL .   sodium chloride flush (NS) 0.9 % injection 3 mL  No current outpatient medications on file.  VITAL SIGNS  Temp:  [97.8 F (36.6 C)-98.9 F (37.2 C)] 98.7 F (37.1 C) (08/06 1144) Pulse Rate:  [94-115] 102 (08/06 1144) Resp:  [15-20] 18 (08/06 1144) BP: (111-152)/(65-85) 135/68 (08/06 1144) SpO2:  [98 %-100 %] 100 % (08/06 1144) Weight:  [59.7 kg-59.8 kg] 59.7 kg (08/06 0500)  PHYSICAL EXAM   General Physical Exam  General: NAD, lying comfortably in bed HENT: Normal oropharynx and mucosa. Normal external appearance of ears and nose. CV/Chest: No JVD, no peripheral edema Lungs: Normal work of breathing. No accessory muscle use Abdomen: Non distended, non tender Extremities: Warm and well perfused. No appreciable edema, cyanosis or deformity. Skin: No rash. Normal palpation of skin.   Musculoskeletal: Normal digits and nails by inspection. No clubbing.  Neurologic Examination  Mental status/Cognition: alert; oriented to self, month and year; good attention; no apparent neglect Speech/language: fluent; comprehension intact; object naming intact inermittently, repetition intact. Cranial nerves:   CN II Visual fields full to confrontation   CN III,IV,VI PERRL. EOMI.   CN V Facial sensation intact to light touch bilaterally   CN VII Face, Smile, Eyebrow raise/closure symmetric.   CN VIII Hearing grossly intact to voice   CN IX & X Soft palate elevates symmetrically in the midline, no dysarthria   CN XI Shoulder shrug with full strength   CN XII Tongue protrudes midline    Motor: Normal bulk and tone. All extremities antigravity without drift *RLE is painful due  to recent fracture.  Sensation: intact to light touch throughout without extinction  Coordination/Complex Motor:  - Finger-to-nose with intention tremors.  IMAGING/DIAGNOSTIC STUDIES  MRI BRrain without contrast: Motion degraded exam. Moderate-sized region of predominantly vasogenic edema within the anterolateral  left frontal lobe. Within this region, and abutting the cortical surface, there is a 1.7 cm circumscribed focus of T2 intermediate signal. There is no appreciable restricted diffusion at this site and there is unchanged overlying small-volume subarachnoid hemorrhage. Findings are suspicious for a possible underlying mass and post-contrast MR imaging of the brain is recommended for further evaluation. Alternatively, these findings may reflect a late subacute infarct with hemorrhagic conversion or parenchymal hematoma with surrounding edema. Multifocal chronic cortically based infarcts within the bilateral cerebral hemispheres as described. Chronic lacunar infarcts within the right basal ganglia, left thalamus and bilateral cerebellar hemispheres. Background mild chronic small vessel ischemic disease within the cerebral [place] matter and pons. Mild generalized parenchymal atrophy. Left sphenoid sinusitis.  MRI Brain with contrast: 1. Redemonstration of left frontal infarct with cortical laminar necrosis. No masslike enhancement to suggest focal lesion. 2. Sequela of remote bilateral cerebral, bilateral cerebellar, right basal ganglia and left thalamic infarcts. Right frontal cortical laminar necrosis. 3. Chronic microvascular ischemic changes.  Mild cerebral atrophy.  rEEG: This study is suggestive of moderate diffuse encephalopathy, nonspecific etiology. No seizures or epileptiform discharges were seen throughout the recording.   Lab Results  Component Value Date   HGBA1C 9.5 (H) 07/24/2020   Lab Results  Component Value Date   LDLCALC 19 07/24/2020     ASSESSMENT AND PLAN  Impression and plan: Susan Navarro is a 59 y.o. female with PMH significant for poorly controlled diabetes, hypertension, hyperlipidemia, prior strokes which patient is not aware of who presents with confusion over the last few days and episodes of staring off.  CT head in the ED concerning for a left  frontal infarct with some hemorrhagic transformation.  Repeat CT head shows some questionable progression of the hemorrhage which was stable on subsequent CT Head.  Neuro exam with no deficit noted today.  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 Suspect focal seizures with retained awareness.  Plan: - Can liberalize SBP goal to < 160. - I ordered SCDs. - Given persistent thrombocytopenia and non traumatic hemorrhagic transformation of stroke, I do not think that she is safe to be on Aspirin for secondary stroke prophylaxis. Would not discharge on Aspirin. - Repeat CTH in 24 hours was completed and shows- stable ICH. - NIHSS per protocol - Platelets>100K, INR<1.4. - Brain imaging- MRI Brain w/o C with vasogenic edema and 1.7cm circumscribed T2 intermediate signal abnormality. MRI with contrast with no mass like lesion, no enhancement. - Vascular imaging- CTA Brain/Neck with no aneurysm, no LVO. - TTE - EF of 60-65%. - Lipid panel with LDL of 19. - Statin - Continue home Atorvastatin 72m daily. - A1C - 9.5. - Telemetry monitoring for arrythmia- ordered. - Swallow screen passed. - Stroke education - ordered. - PT/OT/SLP consulted.  Suspected partial seizures with retained awareness: Daughter endorses patient having episodes of starring off lasting about a minute over the last couple of days. Given new ICH, suspect this could be seizures. My clinical suspicion is high enough that I started her on anti seizure medication and will continue that for now. - Started on Keppra 5034mBID - continue at discharge. - routine EEG - completed with no epileptogenic abnormalities. - Seizure precautions - Will need to follow up with  neurology outpatient for seizure management.  Thrombocytopenia: - Hematology on board to assist with workup to identify treatable causes. - Daily CBC to monitor counts   Signed,  [place] Pager  Number [phone]

## 2020-07-27 NOTE — Progress Notes (Signed)
Occupational Therapy Treatment Patient Details Name: Susan Navarro MRN: [id number] DOB: 1961/05/22 Today's Date: 07/27/2020    History of present illness Pt is a 59 yo female that presented to ED for confusion, inability to speak. PMH of anemia, CKD, DM, HTN, previous stroke. Did have a fall about 3-4weeks ago that resulted in a broken hip. Per imaging, CT head in the ED concerning for a left frontal infarct with some hemorrhagic transformation. Recent hospital stay 7/7 due to fall and imaging reveals L moderately displaced left superior and inferior pubic rami fractures.   OT comments  Susan Navarro was seen for OT treatment on this date. Upon arrival to room pt seated in chair with PT following ambulation. Pt requesting assist to groom hair. Pt tolerated ~3 mins standing c BUE support on RW requiring MAX A for grooming. MOD I self-drinking and grooming seated in chair. NT in room at end of session for vitals, pt left up in chair c all needs in reach. Pt making good progress toward goals, frequency upgraded to 1x/week. Pt continues to benefit from skilled OT services to maximize return to PLOF and minimize risk of future falls, injury, caregiver burden, and readmission. Will continue to follow POC. Discharge recommendation remains appropriate.    Follow Up Recommendations  Home health OT;Supervision - Intermittent    Equipment Recommendations  3 in 1 bedside commode    Recommendations for Other Services      Precautions / Restrictions Precautions Precautions: Fall Precaution Comments: 49wks old L superior and inferior pubic rami fx Restrictions Weight Bearing Restrictions: No       Mobility Bed Mobility               General bed mobility comments: pt received and left up in chair   Transfers Overall transfer level: Modified independent Equipment used: Rolling walker (2 wheeled) Transfers: Sit to/from Stand                Balance Overall balance assessment: Needs  assistance Sitting-balance support: Feet supported Sitting balance-Leahy Scale: Good Sitting balance - Comments: pt able to sit without UE support     Standing balance-Leahy Scale: Good Standing balance comment: single UE support in standing intermittently                            ADL either performed or assessed with clinical judgement   ADL Overall ADL's : Needs assistance/impaired                                       General ADL Comments: MOD I brush hair seated in chair - MAX A + RW for grooming standing. MOD I self-drinking including setup seated in chair.      Vision       Perception     Praxis      Cognition Arousal/Alertness: Awake/alert Behavior During Therapy: WFL for tasks assessed/performed Overall Cognitive Status: Within Functional Limits for tasks assessed                                 General Comments: Generally oriented to self, is noted to have some difficulty with word finding/expression though improved from yesterday. Will continue to assess.        Exercises Exercises: Other exercises Other Exercises Other Exercises: Pt  educated re: DME recs, d/c recs, falls prevention, importance of mobility for functional strengthening Other Exercises: Grooming, self-drinking, sit<>stand, sitting/standing balance/tolerance   Shoulder Instructions       General Comments      Pertinent Vitals/ Pain       Pain Assessment: No/denies pain  Home Living                                          Prior Functioning/Environment              Frequency  Min 1X/week        Progress Toward Goals  OT Goals(current goals can now be found in the care plan section)  Progress towards OT goals: Progressing toward goals  Acute Rehab OT Goals Patient Stated Goal: to get up and walk OT Goal Formulation: With patient Time For Goal Achievement: 08/08/20 Potential to Achieve Goals: Good ADL  Goals Pt Will Perform Eating: with modified independence;sitting (c Adapted utensils PRN for improved safety/functional indep.) Pt Will Perform Grooming: with modified independence;with adaptive equipment;standing (c LRAD PRN for improved safety and functional indep.) Pt Will Transfer to Toilet: with modified independence;ambulating;regular height toilet;bedside commode (c LRAD PRN for improved safety and functional indep.) Pt Will Perform Toileting - Clothing Manipulation and hygiene: sit to/from stand;with modified independence;with adaptive equipment (c LRAD PRN for improved safety and functional indep.)  Plan Discharge plan remains appropriate;Frequency needs to be updated    Co-evaluation                 AM-PAC OT "6 Clicks" Daily Activity     Outcome Measure   Help from another person eating meals?: None Help from another person taking care of personal grooming?: A Little Help from another person toileting, which includes using toliet, bedpan, or urinal?: A Little Help from another person bathing (including washing, rinsing, drying)?: A Little Help from another person to put on and taking off regular upper body clothing?: None Help from another person to put on and taking off regular lower body clothing?: A Little 6 Click Score: 20    End of Session Equipment Utilized During Treatment: Rolling walker  OT Visit Diagnosis: Other abnormalities of gait and mobility (R26.89);Muscle weakness (generalized) (M62.81);Other symptoms and signs involving the nervous system (R29.898)   Activity Tolerance Patient tolerated treatment well   Patient Left in chair;with call bell/phone within reach;with nursing/sitter in room (NT in room at end of session)   Nurse Communication          Time: 1130-1140 OT Time Calculation (min): 10 min  Charges: OT General Charges $OT Visit: 1 Visit OT Treatments $Self Care/Home Management : 8-22 mins  Dessie Coma, M.S. OTR/L  07/27/20, 12:39  PM  ascom [phone]

## 2020-07-27 NOTE — Progress Notes (Addendum)
PROGRESS NOTE    Susan Navarro  GNF:[id number] DOB: [birth year] DOA: 07/24/2020 PCP: Rochel Brome, MD   Brief Narrative:  Susan Navarro is a 59 y.o. female  has a past medical history of Anemia, Chronic kidney disease, Diabetes mellitus without complication ([place]), Hypertension, and Stroke ([place]) (03/2018) who presents with episode of confusion. Patient does have some aphasia on my evaluation which limits the history and probably will need colateral. She reports that she had a fall about a week ago and has a fracture of her pelvis.  She went to bed at 6pm yesterday, woke up at 11PM and both daughter and boyfiend mentioned that she was not herself and confused. She endorses that she was unable to talk. Could not get her words out.  Since admission pt has been in ICU and managed with neurology. Mri has showed small SAH and left anterofrontal moderate sized ICH. Pt has been thrombocytopenic  And has h/o of thrombocytopenia and chart review shows chronic thrombocytopenia as far back as 2017. Pt also has a h/o CKD as far back as 2017 when creatinine was 1.35.  07/27/20: Pt seen today and is alert and awake is feeling better. Denies any pain or headache or any complaints today. Has been working with pt with rec's for home health with home PT.  D/W neurology about plan for d/c and antiplatelet therapy.   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 Goal BP Systolic of 846. -Off anticoagulation. -Manage thrombocytopenia, pt given platelets with goal of >100. -Cont statin therapy.  neurologically pt is fortunately alert and awake and without any deficit.  No change in  regimen.d/c plan per neuro plan.   CKD: -Attibute to htn. -avoid nephrotoxic meds and studies.   Cirrhosis of liver/ Thrombocytopenia: - stable / INR wnl. - platelet transfused and will follow and repeat levels.  DM type I: -a1c is not at goal. -home regimen with Lantus continued and SSI.  HTN: -amlodipine restarted and goal 140-160 SBP.  Anemia: Pt has been anemic since 2017 and attribute to ACD and renal function is improving.    DVT prophylaxis:  Contraindicated.  Code Status:  Full. Family Communication: None at bedside.  Disposition Plan: Home PT vs STR.  Consultants:  Neurology  Procedures:   N/A.  Subjective: Pt today is alert awake and oriented and denise any chest pain palpitation sob , headache bv or any other symptoms.   Objective: Vitals:   07/27/20 0500 07/27/20 0511 07/27/20 0743 07/27/20 1144  BP:  (!) 141/77 (!) 152/85 135/68  Pulse:  100 (!) 105 (!) 102  Resp:  16 16 18   Temp:  98.4 F (36.9 C) 97.8 F (36.6 C) 98.7 F (37.1 C)  TempSrc:  Oral Oral Oral  SpO2:  100% 100% 100%  Weight: 59.7 kg     Height:        Intake/Output Summary (Last 24 hours) at 07/27/2020 1229 Last data filed at 07/26/2020 1844 Gross per 24 hour  Intake 480 ml  Output --  Net 480 ml   Filed Weights   07/25/20 0500 07/26/20 1520 07/27/20 0500  Weight: 60.1 kg 59.8 kg 59.7 kg  Examination: Blood pressure 135/68, pulse (!) 102, temperature 98.7 F (37.1 C), temperature source Oral, resp. rate 18, height 5' 6"  (1.676 m), weight 59.7 kg, SpO2 100 %.  General exam: Appears calm and comfortable  Respiratory system: Clear to auscultation. Respiratory effort normal. Cardiovascular system: S1 & S2 heard, RRR. No JVD, murmurs, rubs, gallops or clicks. No pedal edema. Gastrointestinal system: Abdomen is nondistended, soft and nontender. No organomegaly or masses felt. Normal bowel sounds heard. Central nervous system: Alert and oriented. No focal neurological  deficits. Extremities: Symmetric 5 x 5 power. Skin: No rashes, lesions or ulcers, CM grossly intact. Strength wnl. DTR 1+ in both knees biceps.  Psychiatry: Judgement and insight appear normal. Mood & affect appropriate.   Data Reviewed: I have personally reviewed following labs and imaging studies  CBC: Recent Labs  Lab 07/24/20 0048 07/25/20 0421 07/26/20 0437 07/26/20 1221 07/27/20 0805  WBC 4.3 3.6* 3.0* 3.2* 3.2*  NEUTROABS 2.6  --  1.6* 2.3 1.7  HGB 10.8* 9.8* 9.7* 11.0* 10.2*  HCT 32.6* 28.1* 28.4* 33.2* 29.6*  MCV 92.6 90.1 91.9 93.0 91.9  PLT 100* 88* 77* 116* 96*   Basic Metabolic Panel: Recent Labs  Lab 07/24/20 0048 07/24/20 0048 07/24/20 1545 07/24/20 2249 07/25/20 0421 07/26/20 0437 07/27/20 0805  NA 131*  --  139  --  141 139 138  K 4.9   < > 3.8 3.5 4.4 4.7 4.3  CL 91*  --  104  --  109 106 103  CO2 27  --  25  --  25 26 27   GLUCOSE 549*  --  136*  --  173* 242* 191*  BUN 68*  --  48*  --  45* 33* 30*  CREATININE 2.30*  --  1.56*  --  1.69* 1.46* 1.56*  CALCIUM 9.9  --  9.0  --  8.6* 9.0 9.0  MG  --   --   --   --  1.9 1.6* 1.8  PHOS  --   --   --   --  2.4* 2.8  --    < > = values in this interval not displayed.   GFR: Estimated Creatinine Clearance: 35.9 mL/min (A) (by C-G formula based on SCr of 1.56 mg/dL (H)). Liver Function Tests: Recent Labs  Lab 07/24/20 0048  AST 18  ALT 71*  ALKPHOS 608*  BILITOT 0.8  PROT 7.8  ALBUMIN 4.1   Coagulation Profile: Recent Labs  Lab 07/24/20 0204 07/25/20 1322  INR 1.1 1.0    Recent Labs  Lab 07/26/20 1221 07/26/20 1758 07/26/20 2116 07/27/20 0742 07/27/20 1143  GLUCAP 398* 436* 388* 166* 262*    Recent Results (from the past 240 hour(s))  SARS Coronavirus 2 by RT PCR (hospital order, performed in [facility] hospital lab) Nasopharyngeal Nasopharyngeal Swab     Status: None   Collection Time: 07/24/20  8:39 AM   Specimen: Nasopharyngeal Swab  Result Value Ref Range Status   SARS  Coronavirus 2 NEGATIVE NEGATIVE Final    Comment: (NOTE) SARS-CoV-2 target nucleic acids are NOT DETECTED.  The SARS-CoV-2 RNA is generally detectable in upper and lower respiratory specimens during the acute phase of infection. The lowest concentration of SARS-CoV-2 viral copies this assay can detect is 250 copies / mL. A negative result does not preclude SARS-CoV-2 infection and should not be used as the sole basis for treatment or other patient management decisions.  A negative result may occur with improper specimen collection /  handling, submission of specimen other than nasopharyngeal swab, presence of viral mutation(s) within the areas targeted by this assay, and inadequate number of viral copies (<250 copies / mL). A negative result must be combined with clinical observations, patient history, and epidemiological information.  Fact Sheet for Patients:   StrictlyIdeas.no  Fact Sheet for Healthcare Providers: BankingDealers.co.za  This test is not yet approved or  cleared by the [place] FDA and has been authorized for detection and/or diagnosis of SARS-CoV-2 by FDA under an Emergency Use Authorization (EUA).  This EUA will remain in effect (meaning this test can be used) for the duration of the COVID-19 declaration under Section 564(b)(1) of the Act, 21 U.S.C. section 360bbb-3(b)(1), unless the authorization is terminated or revoked sooner.  Performed at [facility], [address] [place], [place] [address]   MRSA PCR Screening     Status: None   Collection Time: 07/25/20 12:21 AM   Specimen: Nasal Mucosa; Nasopharyngeal  Result Value Ref Range Status   MRSA by PCR NEGATIVE NEGATIVE Final    Comment:        The GeneXpert MRSA Assay (FDA approved for NASAL specimens only), is one component of a comprehensive MRSA colonization surveillance program. It is not intended to diagnose MRSA infection nor to  guide or monitor treatment for MRSA infections. Performed at [facility], [address] [place], [place] [address]     Radiology Studies: CT HEAD WO CONTRAST  Result Date: 07/25/2020 CLINICAL DATA:  Follow-up of intracranial hemorrhage EXAM: CT HEAD WITHOUT CONTRAST TECHNIQUE: Contiguous axial images were obtained from the base of the skull through the vertex without intravenous contrast. COMPARISON:  None. FINDINGS: Brain: Unchanged small volume subarachnoid hemorrhage over the anterior left convexity. Parenchymal hypoattenuation here is also unchanged. The remainder of the brain is also unchanged. Vascular: Negative Skull: Negative Sinuses/Orbits: No acute finding. Other: None. IMPRESSION: Unchanged small volume subarachnoid hemorrhage over the anterior left convexity. Electronically Signed   By: Ulyses Jarred M.D.   On: 07/25/2020 03:46   MR BRAIN WO CONTRAST: Result Date: 07/24/2020 CLINICAL DATA:  Neuro deficit, acute, stroke suspected. Additional provided: 41-year-old female presenting with episode of confusion, recent fall. EXAM: MRI HEAD WITHOUT CONTRAST TECHNIQUE: Multiplanar, multiecho pulse sequences of the brain and surrounding structures were obtained without intravenous contrast. COMPARISON:  Head CT performed earlier the same day 07/24/2020 CT angiogram head 07/24/2020, brain MRI 12/25/2019. FINDINGS: Brain: Intermittently motion degraded examination. Most notably, there is mild-to-moderate motion degradation of the axial T2/FLAIR sequence and SWI sequence as well as axial T1 weighted sequence. There is moderate predominantly vasogenic edema within the anterolateral left frontal lobe. Within this region and abutting the cortical surface, there is a rounded 1.7 cm T2 intermediate signal (series 10, image 17). There is no definite restricted diffusion at this site. Unchanged overlying small volume subarachnoid hemorrhage. Stable background mild generalized parenchymal atrophy.  Redemonstrated remote cortically based infarcts within the posterior right frontal lobe and right frontal operculum, posterior left frontal lobe, bilateral parietal lobes and left occipital lobe. Associated chronic blood products at site of the chronic infarcts within the posterior right frontal lobe and right frontal operculum as well as left parietal lobe. Additional scattered supratentorial chronic microhemorrhages. Chronic lacunar infarcts within the right lentiform nucleus and left thalamus. Numerous small chronic infarcts within the cerebellar hemispheres bilaterally. Background mild chronic small vessel ischemic changes within the cerebral Sargeant matter and pons. There is no acute infarct. No midline shift or hydrocephalus. Vascular:  Expected proximal arterial flow voids. Skull and upper cervical spine: No focal marrow lesion. Sinuses/Orbits: Visualized orbits show no acute finding. Frothy secretions and mild mucosal thickening within the left sphenoid sinus. No significant mastoid effusion. IMPRESSION: Motion degraded exam. Moderate-sized region of predominantly vasogenic edema within the anterolateral left frontal lobe. Within this region, and abutting the cortical surface, there is a 1.7 cm circumscribed focus of T2 intermediate signal. There is no appreciable restricted diffusion at this site and there is unchanged overlying small-volume subarachnoid hemorrhage. Findings are suspicious for a possible underlying mass and post-contrast MR imaging of the brain is recommended for further evaluation. Alternatively, these findings may reflect a late subacute infarct with hemorrhagic conversion or parenchymal hematoma with surrounding edema. Multifocal chronic cortically based infarcts within the bilateral cerebral hemispheres as described. Chronic lacunar infarcts within the right basal ganglia, left thalamus and bilateral cerebellar hemispheres. Background mild chronic small vessel ischemic disease within the  cerebral Lichtenwalner matter and pons. Mild generalized parenchymal atrophy. Left sphenoid sinusitis. Electronically Signed   By: Kellie Simmering DO   On: 07/24/2020 14:46   MR BRAIN W CONTRAST Result Date: 07/25/2020 CLINICAL DATA:  Stroke follow-up. EXAM: MRI HEAD WITH CONTRAST TECHNIQUE: Multiplanar, multiecho pulse sequences of the brain and surrounding structures were obtained with intravenous contrast. CONTRAST:  7.66m GADAVIST GADOBUTROL 1 MMOL/ML IV SOLN COMPARISON:  07/24/2020 MRI head. FINDINGS: Please note that motion artifact limits evaluation. Brain: Mild diffuse parenchymal volume loss with ex vacuo dilatation. No midline shift, ventriculomegaly or extra-axial fluid collection. Redemonstration of left frontal infarct. Associated subarachnoid hemorrhage is less conspicuous. Curvilinear left frontal enhancement and curvilinear right frontal intrinsic T1 hyperintensity reflects cortical laminar necrosis. There is no masslike enhancement to suggest a focal lesion. Sequela of remote insult involving the bilateral frontal, bilateral parietal and left occipital regions. Smaller remote infarcts are seen involving the right basal ganglia, left thalamus and bilateral cerebellum. Background chronic microvascular ischemic changes are better demonstrated on prior exam. Vascular: Grossly normal flow voids. Skull and upper cervical spine: No focal lesion. Sinuses/Orbits: Normal orbits. Mild left sphenoid sinus disease. No mastoid effusion. Other: None. IMPRESSION: 1. Redemonstration of left frontal infarct with cortical laminar necrosis. No masslike enhancement to suggest focal lesion. 2. Sequela of remote bilateral cerebral, bilateral cerebellar, right basal ganglia and left thalamic infarcts. Right frontal cortical laminar necrosis. 3. Chronic microvascular ischemic changes.  Mild cerebral atrophy.  Electronically Signed   By: CPrimitivo GauzeM.D.   On: 07/25/2020 12:05   ECHOCARDIOGRAM REPORT   [facility] Date of Exam: 07/24/2020 Medical Rec #:  [id number]    Height:       66.0 in Accession #:    [id number]   Weight:       125.0 lb Date of Birth:  303-07-1961     BSA:          1.638 m Patient Age:    89years      BP:           120/55 mmHg Patient Gender: F             HR:           85 bpm. Exam Location:  [facility] Procedure: 2D Echo, Cardiac Doppler and Color Doppler Indications:     I163.9 Stroke  History:         Patient has no prior history of Echocardiogram examinations.  Risk Factors:Hypertension and Diabetes. Stroke. Chronic kidney                  disease.  Sonographer:     Wilford Sports Rodgers-Jones Referring Phys:  [id number] Newman Diagnosing Phys: Nelva Bush MD  IMPRESSIONS:   1. Left ventricular ejection fraction, by estimation, is 60 to 65%. The left ventricle has normal function. Left ventricular endocardial border not optimally defined to evaluate regional wall motion. Left ventricular diastolic parameters are consistent with Grade I diastolic dysfunction (impaired relaxation).  2. Right ventricular systolic function is normal. The right ventricular size is normal. Mildly increased right ventricular wall thickness. There is normal pulmonary artery systolic pressure.  3. 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 No evidence of mitral stenosis.  4. The aortic valve is tricuspid. Aortic valve regurgitation is not visualized. No aortic stenosis is present.  5. The inferior vena cava is normal in size with greater than 50% respiratory variability, suggesting right atrial pressure of 3 mmHg.  FINDINGS: Left Ventricle: Left ventricular ejection fraction, by estimation, is 60 to 65%. The left ventricle has normal function. Left ventricular endocardial border not optimally defined to evaluate regional wall motion. The left ventricular internal cavity size was normal in size. There is no left ventricular hypertrophy. Left ventricular  diastolic parameters are consistent with Grade I diastolic dysfunction (impaired relaxation). Right Ventricle: The right ventricular size is normal. Mildly increased right ventricular wall thickness. Right ventricular systolic function is normal. There is normal pulmonary artery systolic pressure. The tricuspid regurgitant velocity is 2.48 m/s, and with an assumed right atrial pressure of 3 mmHg, the estimated right ventricular systolic pressure is 54.0 mmHg. Left Atrium: Left atrial size was normal in size. Right Atrium: Right atrial size was normal in size. Pericardium: Trivial pericardial effusion is present. The pericardial effusion is posterior to the left ventricle. 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 There is mild thickening of the mitral valve leaflet(s). Mild to moderate mitral valve regurgitation. No evidence of mitral valve stenosis. Tricuspid Valve: The tricuspid valve is normal in structure. Tricuspid valve regurgitation is mild. Aortic Valve: The aortic valve is tricuspid. Aortic valve regurgitation is not visualized. No aortic stenosis is present. Pulmonic Valve: The pulmonic valve was grossly normal. Pulmonic valve regurgitation is trivial. No evidence of pulmonic stenosis. Aorta: The aortic root and ascending aorta are structurally normal, with no evidence of dilitation. Pulmonary Artery: The pulmonary artery is not well seen. Venous: The inferior vena cava is normal in size with greater than 50% respiratory variability, suggesting right atrial pressure of 3 mmHg. IAS/Shunts: The interatrial septum was not well visualized.  LEFT VENTRICLE PLAX 2D LVIDd:         4.36 cm  Diastology LVIDs:         2.65 cm  LV e' lateral:   8.59 cm/s LV PW:         0.83 cm  LV E/e' lateral: 7.6 LV IVS:        0.84 cm  LV e' medial:    5.77 cm/s LVOT diam:     1.90 cm  LV E/e' medial:  11.4 LV SV:         56 LV SV Index:   34 LVOT Area:     2.84 cm  RIGHT  VENTRICLE RV Basal diam:  3.96 cm RV S prime:     16.70 cm/s TAPSE (M-mode): 2.4  cm LEFT ATRIUM             Index       RIGHT ATRIUM           Index LA diam:        3.70 cm 2.26 cm/m  RA Area:     12.10 cm LA Vol (A2C):   49.8 ml 30.41 ml/m RA Volume:   31.70 ml  19.36 ml/m LA Vol (A4C):   41.5 ml 25.34 ml/m LA Biplane Vol: 45.9 ml 28.03 ml/m  AORTIC VALVE LVOT Vmax:   103.00 cm/s LVOT Vmean:  71.700 cm/s LVOT VTI:    0.196 m  AORTA Ao Root diam: 3.20 cm Ao Asc diam:  3.20 cm MITRAL VALVE               TRICUSPID VALVE MV Area (PHT): 2.66 cm    TR Peak grad:   24.6 mmHg MV Decel Time: 285 msec    TR Vmax:        248.00 cm/s MV E velocity: 65.60 cm/s MV A velocity: 83.60 cm/s  SHUNTS MV E/A ratio:  0.78        Systemic VTI:  0.20 m                            Systemic Diam: 1.90 cm Nelva Bush MD Electronically signed by Nelva Bush MD Signature Date/Time: 07/25/2020/12:28:15 AM    Final   Scheduled Meds: .  stroke: mapping our early stages of recovery book   Does not apply Once  . sodium chloride   Intravenous Once  . sodium chloride   Intravenous Once  . amLODipine  10 mg Oral Daily  . atorvastatin  80 mg Oral Daily  . Chlorhexidine Gluconate Cloth  6 each Topical Daily  . famotidine  20 mg Oral Daily  . insulin aspart  0-5 Units Subcutaneous QHS  . insulin aspart  0-9 Units Subcutaneous TID WC  . insulin glargine  12 Units Subcutaneous QHS  . levETIRAcetam  500 mg Oral BID  . sodium chloride flush  3 mL Intravenous Q12H   Continuous Infusions: . sodium chloride       LOS: 3 days   Para Skeans, MD [facility] Pager [phone] If 7PM-7AM, please contact night-coverage [email or web address] Password [facility] 07/27/2020, 12:29 PM

## 2020-07-27 NOTE — Progress Notes (Signed)
PHARMACY CONSULT NOTE - FOLLOW UP  Pharmacy Consult for Electrolyte Monitoring and Replacement   Recent Labs: Potassium (mmol/L)  Date Value  07/27/2020 4.3   Magnesium (mg/dL)  Date Value  07/27/2020 1.8   Calcium (mg/dL)  Date Value  07/27/2020 9.0   Albumin (g/dL)  Date Value  07/24/2020 4.1   Phosphorus (mg/dL)  Date Value  07/26/2020 2.8   Sodium (mmol/L)  Date Value  07/27/2020 138   Assessment: 59 year old female admitted with acute hemorrhagic infarct. Patient with h/o diabetes, significantly hyperglycemic on arrival. Patient started on insulin drip. Pharmacy to manage electrolytes. Insulin infusion DCd 8/3. Patient started on Glargine 12u daily.   Goal of Therapy:  Electrolytes WNL  Plan:  Electrolyte consult was initiated in ICU, patient now on 1C.  Electrolytes WNL. No replacement needed.   Pharmacy will sign off at this time.    Pernell Dupre, PharmD, BCPS Clinical Pharmacist 07/27/2020 8:52 AM

## 2020-07-27 NOTE — Progress Notes (Signed)
Physical Therapy Treatment Patient Details Name: Susan Navarro MRN: [id number] DOB: Mar 12, 1961 Today's Date: 07/27/2020    History of Present Illness Pt is a 59 yo female that presented to ED for confusion, inability to speak. PMH of anemia, CKD, DM, HTN, previous stroke. Did have a fall about 3-4weeks ago that resulted in a broken hip. Per imaging, CT head in the ED concerning for a left frontal infarct with some hemorrhagic transformation. Recent hospital stay 7/7 due to fall and imaging reveals L moderately displaced left superior and inferior pubic rami fractures.    PT Comments    Pt alert, in chair, eager for ambulation. She performed sit <> Stand transfers modI with RW, and ambulated ~343f with RW and supervision, mildly decreased gait velocity noted, reciprocal gait pattern. Pt also performed seated therapeutic exercises while PT assisted with de-tangling pt hair. OT entered room, PT left pt in chair with OT at bedside. Due to pt's continued progress and progression towards goals, frequency downgraded to 2x/wk, and pt encouraged to ambulate daily with nursing staff. Recommendation of HHPT remains appropriate to maximize function, mobility, and decrease risk of falls.    Follow Up Recommendations  Home health PT;Supervision - Intermittent     Equipment Recommendations  None recommended by PT    Recommendations for Other Services       Precautions / Restrictions Precautions Precautions: Fall Precaution Comments: 428wksold L superior and inferior pubic rami fx Restrictions Weight Bearing Restrictions: No    Mobility  Bed Mobility               General bed mobility comments: pt up in chair at start of session  Transfers Overall transfer level: Modified independent Equipment used: Rolling walker (2 wheeled) Transfers: Sit to/from Stand              Ambulation/Gait Ambulation/Gait assistance: Supervision Gait Distance (Feet): 300 Feet Assistive device: Rolling  walker (2 wheeled)       General Gait Details: improved gait velocity over time, no unsteadiness noted. Reciprocal gait pattern   Stairs             Wheelchair Mobility    Modified Rankin (Stroke Patients Only)       Balance Overall balance assessment: Needs assistance Sitting-balance support: Feet supported Sitting balance-Leahy Scale: Good Sitting balance - Comments: pt able to sit without UE support     Standing balance-Leahy Scale: Good Standing balance comment: able to reach and adjust socks                            Cognition Arousal/Alertness: Awake/alert Behavior During Therapy: WFL for tasks assessed/performed Overall Cognitive Status: Within Functional Limits for tasks assessed                                 General Comments: Generally oriented to self, is noted to have some difficulty with word finding/expression though improved from yesterday. Will continue to assess.      Exercises Other Exercises Other Exercises: Pt performed seated marching, LAQ and heel/toe raises x20 bilaterally with visual cueing    General Comments        Pertinent Vitals/Pain Pain Assessment: No/denies pain    Home Living                      Prior Function  PT Goals (current goals can now be found in the care plan section) Progress towards PT goals: Progressing toward goals    Frequency    Min 2X/week      PT Plan Current plan remains appropriate;Frequency needs to be updated    Co-evaluation              AM-PAC PT "6 Clicks" Mobility   Outcome Measure  Help needed turning from your back to your side while in a flat bed without using bedrails?: None Help needed moving from lying on your back to sitting on the side of a flat bed without using bedrails?: None Help needed moving to and from a bed to a chair (including a wheelchair)?: None Help needed standing up from a chair using your arms (e.g.,  wheelchair or bedside chair)?: None Help needed to walk in hospital room?: None Help needed climbing 3-5 steps with a railing? : None 6 Click Score: 24    End of Session Equipment Utilized During Treatment: Gait belt Activity Tolerance: Patient tolerated treatment well Patient left: in chair;with call bell/phone within reach Nurse Communication: Mobility status PT Visit Diagnosis: Other abnormalities of gait and mobility (R26.89);Muscle weakness (generalized) (M62.81);Difficulty in walking, not elsewhere classified (R26.2);Pain Pain - Right/Left: Left Pain - part of body: Hip     Time: 0370-9643 PT Time Calculation (min) (ACUTE ONLY): 19 min  Charges:  $Therapeutic Exercise: 8-22 mins                     Lieutenant Diego PT, DPT 11:44 AM,07/27/20

## 2020-07-27 NOTE — Progress Notes (Signed)
PT Cancellation Note  Patient Details Name: Susan Navarro MRN: [id number] DOB: [birth year]   Cancelled Treatment:    Reason Eval/Treat Not Completed: Other (comment) (Pt up in chair, eating breakfast, requested PT to come back later)   Lieutenant Diego PT, DPT 10:10 AM,07/27/20

## 2020-07-27 NOTE — Progress Notes (Signed)
PHARMACIST - PHYSICIAN COMMUNICATION  CONCERNING: IV to Oral Route Change Policy  RECOMMENDATION: This patient is receiving famotidine 29m by the intravenous route.  Based on criteria approved by the Pharmacy and Therapeutics Committee, the intravenous medication(s) is/are being converted to the equivalent oral dose form(s).  DESCRIPTION: These criteria include:  The patient is eating (either orally or via tube) and/or has been taking other orally administered medications for a least 24 hours  The patient has no evidence of active gastrointestinal bleeding or impaired GI absorption (gastrectomy, short bowel, patient on TNA or NPO).  If you have questions about this conversion, please contact the [facility], BC[facility] Clinical Pharmacist 07/27/2020 8:58 AM

## 2020-07-27 NOTE — Progress Notes (Signed)
Called pts daughter Vana with pts permission, asked daughter if she had any questions regarding pt states no and that she just spoke with MD on the phone and that she will be coming to the hospital in an hour to see her mom

## 2020-07-28 ENCOUNTER — Inpatient Hospital Stay: Payer: Medicare HMO

## 2020-07-28 LAB — FOLATE: Folate: 12.1 ng/mL (ref >5.9)

## 2020-07-28 LAB — CBC WITH DIFFERENTIAL/PLATELET
Abs Immature Granulocytes: 0 10*3/uL (ref 0.00–0.07)
Basophils Absolute: 0 10*3/uL (ref 0.0–0.1)
Basophils Relative: 0 %
Eosinophils Absolute: 0.1 10*3/uL (ref 0.0–0.5)
Eosinophils Relative: 4 %
HCT: 28.3 % — ABNORMAL LOW (ref 36.0–46.0)
Hemoglobin: 9.6 g/dL — ABNORMAL LOW (ref 12.0–15.0)
Immature Granulocytes: 0 %
Lymphocytes Relative: 32 %
Lymphs Abs: 0.8 10*3/uL (ref 0.7–4.0)
MCH: 31.4 pg (ref 26.0–34.0)
MCHC: 33.9 g/dL (ref 30.0–36.0)
MCV: 92.5 fL (ref 80.0–100.0)
Monocytes Absolute: 0.2 10*3/uL (ref 0.1–1.0)
Monocytes Relative: 9 %
Neutro Abs: 1.3 10*3/uL — ABNORMAL LOW (ref 1.7–7.7)
Neutrophils Relative %: 55 %
Platelets: 88 10*3/uL — ABNORMAL LOW (ref 150–400)
RBC: 3.06 MIL/uL — ABNORMAL LOW (ref 3.87–5.11)
RDW: 13.5 % (ref 11.5–15.5)
WBC: 2.4 10*3/uL — ABNORMAL LOW (ref 4.0–10.5)
nRBC: 0 % (ref 0.0–0.2)

## 2020-07-28 LAB — PROTIME-INR
INR: 1.1 (ref 0.8–1.2)
Prothrombin Time: 13.3 seconds (ref 11.4–15.2)

## 2020-07-28 LAB — PLATELET BY CITRATE: Platelet CT in Citrate: 89

## 2020-07-28 LAB — GLUCOSE, CAPILLARY
Glucose-Capillary: 84 mg/dL (ref 70–99)
Glucose-Capillary: 218 mg/dL — ABNORMAL HIGH (ref 70–99)
Glucose-Capillary: 357 mg/dL — ABNORMAL HIGH (ref 70–99)
Glucose-Capillary: 382 mg/dL — ABNORMAL HIGH (ref 70–99)

## 2020-07-28 LAB — CBC
HCT: 29.4 % — ABNORMAL LOW (ref 36.0–46.0)
Hemoglobin: 9.5 g/dL — ABNORMAL LOW (ref 12.0–15.0)
MCH: 30.8 pg (ref 26.0–34.0)
MCHC: 32.3 g/dL (ref 30.0–36.0)
MCV: 95.5 fL (ref 80.0–100.0)
Platelets: 99 10*3/uL — ABNORMAL LOW (ref 150–400)
RBC: 3.08 MIL/uL — ABNORMAL LOW (ref 3.87–5.11)
RDW: 13.4 % (ref 11.5–15.5)
WBC: 3.8 10*3/uL — ABNORMAL LOW (ref 4.0–10.5)
nRBC: 0 % (ref 0.0–0.2)

## 2020-07-28 LAB — VITAMIN B12: Vitamin B-12: 791 pg/mL (ref 180–914)

## 2020-07-28 LAB — HEPATITIS B CORE ANTIBODY, TOTAL: Hep B Core Total Ab: NONREACTIVE

## 2020-07-28 LAB — HEPATITIS C ANTIBODY: HCV Ab: NONREACTIVE

## 2020-07-28 LAB — TSH: TSH: 2.332 u[IU]/mL (ref 0.350–4.500)

## 2020-07-28 LAB — APTT: aPTT: 24 seconds (ref 24–36)

## 2020-07-28 MED ORDER — PROPRANOLOL HCL 10 MG PO TABS
10.0000 mg | ORAL_TABLET | Freq: Three times a day (TID) | ORAL | Status: DC
  Administered 2020-07-28 – 2020-07-29 (×3): 10 mg via ORAL
  Filled 2020-07-28 (×6): qty 1

## 2020-07-28 MED ORDER — SODIUM CHLORIDE 0.9% IV SOLUTION: Freq: Once | INTRAVENOUS | Status: AC

## 2020-07-28 NOTE — Progress Notes (Signed)
PROGRESS NOTE    Susan Navarro  FUX:[id number] DOB: August 10, 1961 DOA: 07/24/2020 PCP: Rochel Brome, MD   Brief Narrative:  Susan Navarro is a 59 y.o. female  has a past medical history of Anemia, Chronic kidney disease, Diabetes mellitus without complication ([place]), Hypertension, and Stroke ([place]) (03/2018) who presents with episode of confusion. Patient does have some aphasia on my evaluation which limits the history and probably will need colateral. She reports that she had a fall about a week ago and has a fracture of her pelvis.  She went to bed at 6pm yesterday, woke up at 11PM and both daughter and boyfiend mentioned that she was not herself and confused. She endorses that she was unable to talk. Could not get her words out.  Since admission pt has been in ICU and managed with neurology. Mri has showed small SAH and left anterofrontal moderate sized ICH. Pt has been thrombocytopenic  And has h/o of thrombocytopenia and chart review shows chronic thrombocytopenia as far back as 2017. Pt also has a h/o CKD as far back as 2017 when creatinine was 1.35.  07/27/20: Pt seen today and is alert and awake is feeling better. Denies any pain or headache or any complaints today. Has been working with pt with rec's for home health with home PT.  D/W neurology about plan for d/c and antiplatelet therapy.  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 Goal BP Systolic of 025. -Off anticoagulation. -Manage thrombocytopenia, pt given platelets with goal of >100. -Cont statin therapy.  neurologically pt is fortunately alert and awake and without any deficit.  No change in regimen.d/c plan per neuro plan.  Stable on repeat imaging.   CKD: -Attibute to htn. -avoid nephrotoxic meds and studies.  -creatinine is 1.56 today.   Cirrhosis of liver/ Thrombocytopenia: - stable / INR wnl. - platelet transfused and will follow and repeat levels. - 2 units of platelets today.   DM type I: -a1c is not at goal -9.5 this admission.  -home regimen with Lantus continued and SSI. - HTN: -amlodipine restarted and goal 140-160 SBP. -propranolol added 10 mg tid.   Anemia: Pt has been anemic since 2017 and attribute to ACD and renal function is improving.    DVT prophylaxis:  Contraindicated.  Code Status:  Full. Family Communication: None at bedside.  Disposition Plan: Home PT vs STR.  Consultants:  Neurology  Procedures:   N/A.  Subjective: Pt today is alert awake and oriented and denise any chest pain palpitation sob , headache bv or any other symptoms.   Objective: Vitals:   07/27/20 2356 07/28/20 0407 07/28/20 0500 07/28/20 0823  BP: 131/76 132/77  133/78  Pulse: (!) 102 97  (!) 102  Resp: 16 16  17   Temp: 98.3 F (36.8 C) 98.3 F (36.8 C)  98.4 F (36.9 C)  TempSrc: Oral Oral  SpO2: 100% 100%  100%  Weight:   61 kg   Height:        Intake/Output Summary (Last 24 hours) at 07/28/2020 1125 Last data filed at 07/27/2020 1700 Gross per 24 hour  Intake 240 ml  Output --  Net 240 ml   Filed Weights   07/26/20 1520 07/27/20 0500 07/28/20 0500  Weight: 59.8 kg 59.7 kg 61 kg    Examination: Blood pressure 133/78, pulse (!) 102, temperature 98.4 F (36.9 C), resp. rate 17, height 5' 6"  (1.676 m), weight 61 kg, SpO2 100 %.  General exam:  Appears calm and comfortable  Respiratory system: Clear to auscultation. Respiratory effort normal. Cardiovascular system: S1 & S2 heard, RRR. No JVD, murmurs, rubs, gallops or clicks. No pedal edema. Gastrointestinal system: Abdomen is nondistended, soft and nontender. No organomegaly or masses felt. Normal bowel sounds heard. Central nervous system: Alert and oriented. No focal neurological deficits. Extremities: Symmetric 5 x 5 power. Skin: No rashes, lesions or ulcers, CM grossly intact. Strength wnl. DTR 1+ in both knees biceps.  Psychiatry: Judgement and insight appear normal. Mood & affect appropriate.   Data Reviewed: I have personally reviewed following labs and imaging studies  CBC: Recent Labs  Lab 07/24/20 0048 07/24/20 0048 07/25/20 0421 07/26/20 0437 07/26/20 1221 07/27/20 0805 07/28/20 0921  WBC 4.3   < > 3.6* 3.0* 3.2* 3.2* 2.4*  NEUTROABS 2.6  --   --  1.6* 2.3 1.7 1.3*  HGB 10.8*   < > 9.8* 9.7* 11.0* 10.2* 9.6*  HCT 32.6*   < > 28.1* 28.4* 33.2* 29.6* 28.3*  MCV 92.6   < > 90.1 91.9 93.0 91.9 92.5  PLT 100*   < > 88* 77* 116* 96* 88*   < > = values in this interval not displayed.   Basic Metabolic Panel: Recent Labs  Lab 07/24/20 0048 07/24/20 0048 07/24/20 1545 07/24/20 2249 07/25/20 0421 07/26/20 0437 07/27/20 0805  NA 131*  --  139  --  141 139 138  K 4.9   < > 3.8 3.5 4.4 4.7 4.3  CL 91*  --  104  --  109 106 103  CO2 27  --  25  --  25 26 27   GLUCOSE 549*  --  136*  --  173* 242* 191*  BUN 68*  --  48*  --  45* 33* 30*  CREATININE 2.30*  --  1.56*  --  1.69* 1.46* 1.56*  CALCIUM 9.9  --  9.0  --  8.6* 9.0 9.0  MG  --   --   --   --  1.9 1.6* 1.8  PHOS  --   --   --   --  2.4* 2.8  --    < > = values in this interval not displayed.   GFR: Estimated Creatinine Clearance: 35.9 mL/min (A) (by C-G formula based on SCr of 1.56 mg/dL (H)). Liver Function Tests: Recent Labs  Lab 07/24/20 0048  AST 18  ALT 71*  ALKPHOS 608*  BILITOT 0.8    PROT 7.8  ALBUMIN 4.1   Coagulation Profile: Recent Labs  Lab 07/24/20 0204 07/25/20 1322 07/28/20 0921  INR 1.1 1.0 1.1    Recent Labs  Lab 07/27/20 0742 07/27/20 1143 07/27/20 1600 07/27/20 2053 07/28/20 0822  GLUCAP 166* 262* 191* 234* 84    Recent Results (from the past 240 hour(s))  SARS Coronavirus 2 by RT PCR (hospital order, performed in [facility] hospital lab)  Nasopharyngeal Nasopharyngeal Swab     Status: None   Collection Time: 07/24/20  8:39 AM   Specimen: Nasopharyngeal Swab  Result Value Ref Range Status   SARS Coronavirus 2 NEGATIVE NEGATIVE Final    Comment: (NOTE) SARS-CoV-2 target nucleic acids are NOT DETECTED.  The SARS-CoV-2 RNA is generally detectable in upper and lower respiratory specimens during the acute phase of infection. The lowest concentration of SARS-CoV-2 viral copies this assay can detect is 250 copies / mL. A negative result does not preclude SARS-CoV-2 infection and should not be used as the sole basis for treatment or other patient management decisions.  A negative result may occur with improper specimen collection / handling, submission of specimen other than nasopharyngeal swab, presence of viral mutation(s) within the areas targeted by this assay, and inadequate number of viral copies (<250 copies / mL). A negative result must be combined with clinical observations, patient history, and epidemiological information.  Fact Sheet for Patients:   StrictlyIdeas.no  Fact Sheet for Healthcare Providers: BankingDealers.co.za  This test is not yet approved or  cleared by the [place] FDA and has been authorized for detection and/or diagnosis of SARS-CoV-2 by FDA under an Emergency Use Authorization (EUA).  This EUA will remain in effect (meaning this test can be used) for the duration of the COVID-19 declaration under Section 564(b)(1) of the Act, 21 U.S.C. section  360bbb-3(b)(1), unless the authorization is terminated or revoked sooner.  Performed at [facility], [address] [place], [place] [address]   MRSA PCR Screening     Status: None   Collection Time: 07/25/20 12:21 AM   Specimen: Nasal Mucosa; Nasopharyngeal  Result Value Ref Range Status   MRSA by PCR NEGATIVE NEGATIVE Final    Comment:        The GeneXpert MRSA Assay (FDA approved for NASAL specimens only), is one component of a comprehensive MRSA colonization surveillance program. It is not intended to diagnose MRSA infection nor to guide or monitor treatment for MRSA infections. Performed at [facility], [address] [place], [place] [address]     Radiology Studies: CT HEAD WO CONTRAST  Result Date: 07/25/2020 CLINICAL DATA:  Follow-up of intracranial hemorrhage EXAM: CT HEAD WITHOUT CONTRAST TECHNIQUE: Contiguous axial images were obtained from the base of the skull through the vertex without intravenous contrast. COMPARISON:  None. FINDINGS: Brain: Unchanged small volume subarachnoid hemorrhage over the anterior left convexity. Parenchymal hypoattenuation here is also unchanged. The remainder of the brain is also unchanged. Vascular: Negative Skull: Negative Sinuses/Orbits: No acute finding. Other: None. IMPRESSION: Unchanged small volume subarachnoid hemorrhage over the anterior left convexity. Electronically Signed   By: Ulyses Jarred M.D.   On: 07/25/2020 03:46   MR BRAIN WO CONTRAST: Result Date: 07/24/2020 CLINICAL DATA:  Neuro deficit, acute, stroke suspected. Additional provided: 32-year-old female presenting with episode of confusion, recent fall. EXAM: MRI HEAD WITHOUT CONTRAST TECHNIQUE: Multiplanar, multiecho pulse sequences of the brain and surrounding structures were obtained without intravenous contrast. COMPARISON:  Head CT performed earlier the same day 07/24/2020 CT angiogram head 07/24/2020, brain MRI 12/25/2019. FINDINGS: Brain:  Intermittently motion degraded examination. Most notably, there is mild-to-moderate motion degradation of the axial T2/FLAIR sequence and SWI sequence as well as axial T1 weighted sequence. There is moderate predominantly vasogenic edema within the anterolateral left frontal lobe. Within this region and abutting the cortical surface, there is a rounded 1.7 cm T2 intermediate signal (series 10, image 17). There is no definite restricted  diffusion at this site. Unchanged overlying small volume subarachnoid hemorrhage. Stable background mild generalized parenchymal atrophy. Redemonstrated remote cortically based infarcts within the posterior right frontal lobe and right frontal operculum, posterior left frontal lobe, bilateral parietal lobes and left occipital lobe. Associated chronic blood products at site of the chronic infarcts within the posterior right frontal lobe and right frontal operculum as well as left parietal lobe. Additional scattered supratentorial chronic microhemorrhages. Chronic lacunar infarcts within the right lentiform nucleus and left thalamus. Numerous small chronic infarcts within the cerebellar hemispheres bilaterally. Background mild chronic small vessel ischemic changes within the cerebral Wynder matter and pons. There is no acute infarct. No midline shift or hydrocephalus. Vascular: Expected proximal arterial flow voids. Skull and upper cervical spine: No focal marrow lesion. Sinuses/Orbits: Visualized orbits show no acute finding. Frothy secretions and mild mucosal thickening within the left sphenoid sinus. No significant mastoid effusion. IMPRESSION: Motion degraded exam. Moderate-sized region of predominantly vasogenic edema within the anterolateral left frontal lobe. Within this region, and abutting the cortical surface, there is a 1.7 cm circumscribed focus of T2 intermediate signal. There is no appreciable restricted diffusion at this site and there is unchanged overlying small-volume  subarachnoid hemorrhage. Findings are suspicious for a possible underlying mass and post-contrast MR imaging of the brain is recommended for further evaluation. Alternatively, these findings may reflect a late subacute infarct with hemorrhagic conversion or parenchymal hematoma with surrounding edema. Multifocal chronic cortically based infarcts within the bilateral cerebral hemispheres as described. Chronic lacunar infarcts within the right basal ganglia, left thalamus and bilateral cerebellar hemispheres. Background mild chronic small vessel ischemic disease within the cerebral Apo matter and pons. Mild generalized parenchymal atrophy. Left sphenoid sinusitis. Electronically Signed   By: Kellie Simmering DO   On: 07/24/2020 14:46   MR BRAIN W CONTRAST Result Date: 07/25/2020 CLINICAL DATA:  Stroke follow-up. EXAM: MRI HEAD WITH CONTRAST TECHNIQUE: Multiplanar, multiecho pulse sequences of the brain and surrounding structures were obtained with intravenous contrast. CONTRAST:  7.21m GADAVIST GADOBUTROL 1 MMOL/ML IV SOLN COMPARISON:  07/24/2020 MRI head. FINDINGS: Please note that motion artifact limits evaluation. Brain: Mild diffuse parenchymal volume loss with ex vacuo dilatation. No midline shift, ventriculomegaly or extra-axial fluid collection. Redemonstration of left frontal infarct. Associated subarachnoid hemorrhage is less conspicuous. Curvilinear left frontal enhancement and curvilinear right frontal intrinsic T1 hyperintensity reflects cortical laminar necrosis. There is no masslike enhancement to suggest a focal lesion. Sequela of remote insult involving the bilateral frontal, bilateral parietal and left occipital regions. Smaller remote infarcts are seen involving the right basal ganglia, left thalamus and bilateral cerebellum. Background chronic microvascular ischemic changes are better demonstrated on prior exam. Vascular: Grossly normal flow voids. Skull and upper cervical spine: No focal lesion.  Sinuses/Orbits: Normal orbits. Mild left sphenoid sinus disease. No mastoid effusion. Other: None. IMPRESSION: 1. Redemonstration of left frontal infarct with cortical laminar necrosis. No masslike enhancement to suggest focal lesion. 2. Sequela of remote bilateral cerebral, bilateral cerebellar, right basal ganglia and left thalamic infarcts. Right frontal cortical laminar necrosis. 3. Chronic microvascular ischemic changes.  Mild cerebral atrophy.  Electronically Signed   By: CPrimitivo GauzeM.D.   On: 07/25/2020 12:05   ECHOCARDIOGRAM REPORT   [facility]am: 07/24/2020 Medical Rec #:  [id number]    Height:       66.0 in Accession #:    [id number]   Weight:       125.0 lb Date of  Birth:  06/25/1961      BSA:          1.638 m Patient Age:    59 years      BP:           120/55 mmHg Patient Gender: F             HR:           85 bpm. Exam Location:  [facility] Procedure: 2D Echo, Cardiac Doppler and Color Doppler Indications:     I163.9 Stroke  History:         Patient has no prior history of Echocardiogram examinations.                  Risk Factors:Hypertension and Diabetes. Stroke. Chronic kidney                  disease.  Sonographer:     Wilford Sports Rodgers-Jones Referring Phys:  [id number] [place] Diagnosing Phys: Nelva Bush MD  IMPRESSIONS:   1. Left ventricular ejection fraction, by estimation, is 60 to 65%. The left ventricle has normal function. Left ventricular endocardial border not optimally defined to evaluate regional wall motion. Left ventricular diastolic parameters are consistent with Grade I diastolic dysfunction (impaired relaxation).  2. Right ventricular systolic function is normal. The right ventricular size is normal. Mildly increased right ventricular wall thickness. There is normal pulmonary artery systolic pressure.  3. 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 No evidence of mitral stenosis.  4. The aortic valve is  tricuspid. Aortic valve regurgitation is not visualized. No aortic stenosis is present.  5. The inferior vena cava is normal in size with greater than 50% respiratory variability, suggesting right atrial pressure of 3 mmHg.  FINDINGS: Left Ventricle: Left ventricular ejection fraction, by estimation, is 60 to 65%. The left ventricle has normal function. Left ventricular endocardial border not optimally defined to evaluate regional wall motion. The left ventricular internal cavity size was normal in size. There is no left ventricular hypertrophy. Left ventricular diastolic parameters are consistent with Grade I diastolic dysfunction (impaired relaxation). Right Ventricle: The right ventricular size is normal. Mildly increased right ventricular wall thickness. Right ventricular systolic function is normal. There is normal pulmonary artery systolic pressure. The tricuspid regurgitant velocity is 2.48 m/s, and with an assumed right atrial pressure of 3 mmHg, the estimated right ventricular systolic pressure is 73.7 mmHg. Left Atrium: Left atrial size was normal in size. Right Atrium: Right atrial size was normal in size. Pericardium: Trivial pericardial effusion is present. The pericardial effusion is posterior to the left ventricle. 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 There is mild thickening of the mitral valve leaflet(s). Mild to moderate mitral valve regurgitation. No evidence of mitral valve stenosis. Tricuspid Valve: The tricuspid valve is normal in structure. Tricuspid valve regurgitation is mild. Aortic Valve: The aortic valve is tricuspid. Aortic valve regurgitation is not visualized. No aortic stenosis is present. Pulmonic Valve: The pulmonic valve was grossly normal. Pulmonic valve regurgitation is trivial. No evidence of pulmonic stenosis. Aorta: The aortic root and ascending aorta are structurally normal, with no evidence of dilitation. Pulmonary  Artery: The pulmonary artery is not well seen. Venous: The inferior vena cava is normal in size with greater than 50% respiratory variability, suggesting right atrial pressure of 3 mmHg. IAS/Shunts: The interatrial septum was not well visualized.  LEFT VENTRICLE PLAX 2D LVIDd:  4.36 cm  Diastology LVIDs:         2.65 cm  LV e' lateral:   8.59 cm/s LV PW:         0.83 cm  LV E/e' lateral: 7.6 LV IVS:        0.84 cm  LV e' medial:    5.77 cm/s LVOT diam:     1.90 cm  LV E/e' medial:  11.4 LV SV:         56 LV SV Index:   34 LVOT Area:     2.84 cm  RIGHT VENTRICLE RV Basal diam:  3.96 cm RV S prime:     16.70 cm/s TAPSE (M-mode): 2.4 cm LEFT ATRIUM             Index       RIGHT ATRIUM           Index LA diam:        3.70 cm 2.26 cm/m  RA Area:     12.10 cm LA Vol (A2C):   49.8 ml 30.41 ml/m RA Volume:   31.70 ml  19.36 ml/m LA Vol (A4C):   41.5 ml 25.34 ml/m LA Biplane Vol: 45.9 ml 28.03 ml/m  AORTIC VALVE LVOT Vmax:   103.00 cm/s LVOT Vmean:  71.700 cm/s LVOT VTI:    0.196 m  AORTA Ao Root diam: 3.20 cm Ao Asc diam:  3.20 cm MITRAL VALVE               TRICUSPID VALVE MV Area (PHT): 2.66 cm    TR Peak grad:   24.6 mmHg MV Decel Time: 285 msec    TR Vmax:        248.00 cm/s MV E velocity: 65.60 cm/s MV A velocity: 83.60 cm/s  SHUNTS MV E/A ratio:  0.78        Systemic VTI:  0.20 m                            Systemic Diam: 1.90 cm Nelva Bush MD Electronically signed by Nelva Bush MD Signature Date/Time: 07/25/2020/12:28:15 AM    Final   Scheduled Meds: .  stroke: mapping our early stages of recovery book   Does not apply Once  . sodium chloride   Intravenous Once  . sodium chloride   Intravenous Once  . sodium chloride   Intravenous Once  . amLODipine  10 mg Oral Daily  . atorvastatin  80 mg Oral Daily  . famotidine  20 mg Oral Daily  . insulin aspart  0-5 Units Subcutaneous QHS  . insulin aspart  0-9 Units Subcutaneous TID WC  . insulin glargine  12 Units Subcutaneous QHS  .  levETIRAcetam  500 mg Oral BID  . propranolol  10 mg Oral TID   Continuous Infusions:    LOS: 4 days   Para Skeans, MD [facility] Pager [phone] If 7PM-7AM, please contact night-coverage [email or web address] Password [facility] 07/28/2020, 11:25 AM

## 2020-07-28 NOTE — Progress Notes (Signed)
Neurology Progress Note  IDENTIFYING INFORMATION  Susan Navarro MR# [id number] 07/28/2020  HISTORY OF PRESENT ILLNESS  Susan Navarro is a 59 y.o. female  has a past medical history of Anemia, Chronic kidney disease, Diabetes mellitus without complication ([place]), Hypertension, and Stroke ([place]) (03/2018) who presents with episode of confusion and starring off. 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  INTERVAL HISTORY   Daughter resports that the aunt was in there and may have seen a ? Episode of starring off in the space for a couple secs at the most but she is not sure if they were starring off or not.  Patient denies any symptoms today.   MEDICATIONS    Current Facility-Administered Medications (Endocrine & Metabolic):  .  insulin aspart (novoLOG) injection 0-5 Units .  insulin aspart (novoLOG) injection 0-9 Units   Current Facility-Administered Medications (Cardiovascular):  .  [COMPLETED] labetalol (NORMODYNE) injection 20 mg **AND** nicardipine (CARDENE) 49m in 0.86% saline 2089mIV infusion (0.1 mg/ml)       Current Facility-Administered Medications (Analgesics):  .  acetaminophen (TYLENOL) tablet 650 mg     Current Facility-Administered Medications (Other):  .   stroke: mapping our early stages of recovery book .  0.9 %  sodium chloride infusion .  Chlorhexidine Gluconate Cloth 2 % PADS 6 each .  docusate sodium (COLACE) capsule 100 mg .  famotidine (PEPCID) IVPB 20 mg premix .  gadobutrol (GADAVIST) 1 MMOL/ML injection 7.5 mL .  levETIRAcetam (KEPPRA) tablet 500 mg .  ondansetron (ZOFRAN) injection 4 mg .  polyethylene glycol (MIRALAX / GLYCOLAX) packet 17 g .  sodium chloride flush (NS) 0.9 % injection 3 mL .  sodium chloride flush (NS) 0.9 % injection 3 mL  No current outpatient medications on file.  VITAL SIGNS  Temp:  [98.2 F (36.8 C)-98.8 F (37.1 C)] 98.2 F (36.8 C) (08/07 1143) Pulse Rate:  [97-107] 101 (08/07 1143) Resp:  [16-18] 18 (08/07  1143) BP: (123-135)/(56-78) 123/56 (08/07 1143) SpO2:  [81 %-100 %] 100 % (08/07 1143) Weight:  [61 kg] 61 kg (08/07 0500)  PHYSICAL EXAM   General Physical Exam  General: NAD, lying comfortably in bed HENT: Normal oropharynx and mucosa. Normal external appearance of ears and nose. CV/Chest: No JVD, no peripheral edema Lungs: Normal work of breathing. No accessory muscle use Abdomen: Non distended, non tender Extremities: Warm and well perfused. No appreciable edema, cyanosis or deformity. Skin: No rash. Normal palpation of skin.   Musculoskeletal: Normal digits and nails by inspection. No clubbing.  Neurologic Examination  Mental status/Cognition: alert; oriented to self, month and year; good attention; no apparent neglect Speech/language: fluent; comprehension intact; object naming intact inermittently, repetition intact. Cranial nerves:   CN II Visual fields full to confrontation   CN III,IV,VI PERRL. EOMI.   CN V Facial sensation intact to light touch bilaterally   CN VII Face, Smile, Eyebrow raise/closure symmetric.   CN VIII Hearing grossly intact to voice   CN IX & X Soft palate elevates symmetrically in the midline, no dysarthria   CN XI Shoulder shrug with full strength   CN XII Tongue protrudes midline    Motor: Normal bulk and tone. All extremities antigravity without drift *RLE is painful due to recent fracture.  Sensation: intact to light touch throughout without extinction  Coordination/Complex Motor:  - Finger-to-nose with intention tremors.  IMAGING/DIAGNOSTIC STUDIES  MRI BRrain without contrast: Motion degraded exam. Moderate-sized region of predominantly vasogenic edema within the anterolateral left frontal lobe.  Within this region, and abutting the cortical surface, there is a 1.7 cm circumscribed focus of T2 intermediate signal. There is no appreciable restricted diffusion at this site and there is unchanged overlying small-volume  subarachnoid hemorrhage. Findings are suspicious for a possible underlying mass and post-contrast MR imaging of the brain is recommended for further evaluation. Alternatively, these findings may reflect a late subacute infarct with hemorrhagic conversion or parenchymal hematoma with surrounding edema. Multifocal chronic cortically based infarcts within the bilateral cerebral hemispheres as described. Chronic lacunar infarcts within the right basal ganglia, left thalamus and bilateral cerebellar hemispheres. Background mild chronic small vessel ischemic disease within the cerebral [place] matter and pons. Mild generalized parenchymal atrophy. Left sphenoid sinusitis.  MRI Brain with contrast: 1. Redemonstration of left frontal infarct with cortical laminar necrosis. No masslike enhancement to suggest focal lesion. 2. Sequela of remote bilateral cerebral, bilateral cerebellar, right basal ganglia and left thalamic infarcts. Right frontal cortical laminar necrosis. 3. Chronic microvascular ischemic changes.  Mild cerebral atrophy.  rEEG: This study is suggestive of moderate diffuse encephalopathy, nonspecific etiology. No seizures or epileptiform discharges were seen throughout the recording.   Lab Results  Component Value Date   HGBA1C 9.5 (H) 07/24/2020   Lab Results  Component Value Date   LDLCALC 19 07/24/2020     ASSESSMENT AND PLAN  Impression and plan: Susan Navarro is a 59 y.o. female with PMH significant for poorly controlled diabetes, hypertension, hyperlipidemia, prior strokes which patient is not aware of who presents with confusion over the last few days and episodes of staring off.  CT head in the ED concerning for a left frontal infarct with some hemorrhagic transformation.  Repeat CT head shows some questionable progression of the hemorrhage which was stable on subsequent CT Head.  Neuro exam with no deficit noted today.  Impression: Left frontal ischemic infarct  with hemorrhagic transformation. Suspect focal seizures with retained awareness.  Plan: - SBP goal: normotension. - Continue SCDs. - Repeat CTH in 24 hours was completed and shows- stable ICH. - Hold off on Aspirin for now. - NIHSS per protocol - Platelets>100K, INR<1.4. - Brain imaging- MRI Brain w/o C with vasogenic edema and 1.7cm circumscribed T2 intermediate signal abnormality. MRI with contrast with no mass like lesion, no enhancement. - Vascular imaging- CTA Brain/Neck with no aneurysm, no LVO. - TTE - EF of 60-65%. - Lipid panel with LDL of 19. - Statin - Continue home Atorvastatin 24m daily. - A1C - 9.5. - Telemetry monitoring for arrythmia- ordered. - Swallow screen passed. - Stroke education - ordered. - PT/OT/SLP consulted. - D[facility] Recommend that patient follow up wti the PCP within 1 week of discharge and get a repeat CTH and repeat CBC. If the patient does not have any worsening of ICH on CTH and if the CBC shows platelets above 50K, then she should be started back on Aspirin 865mdaily. Both patient and daughter are aware. - Follow up with Dr. SeLeonie Mann stroke clinic.  Suspected partial seizures with retained awareness: Daughter endorses patient having episodes of starring off lasting about a minute over the last couple of days. Given new ICH, suspect this could be seizures. My clinical suspicion is high enough that I started her on anti seizure medication and will continue that for now. - Started on Keppra 50034mID - continue at discharge. - routine EEG - completed with no epileptogenic abnormalities. - Seizure precautions - Will need  to follow up with neurology outpatient for seizure management.  Thrombocytopenia: - Hematology on board, appreciate their input. Think this is due to hx of cirrhosis and hypersplenism. - Daily CBC to monitor counts while in the hospital.  Signed,  [place] Pager Number [phone]

## 2020-07-29 ENCOUNTER — Other Ambulatory Visit: Payer: Self-pay | Admitting: Internal Medicine

## 2020-07-29 LAB — BPAM PLATELET PHERESIS
Blood Product Expiration Date: 202108082359
Blood Product Expiration Date: 202108102359
ISSUE DATE / TIME: 202108071729
ISSUE DATE / TIME: 202108072343
Unit Type and Rh: 7300
Unit Type and Rh: 8400

## 2020-07-29 LAB — CBC WITH DIFFERENTIAL/PLATELET
Abs Immature Granulocytes: 0.01 10*3/uL (ref 0.00–0.07)
Basophils Absolute: 0 10*3/uL (ref 0.0–0.1)
Basophils Relative: 0 %
Eosinophils Absolute: 0.2 10*3/uL (ref 0.0–0.5)
Eosinophils Relative: 4 %
HCT: 30.8 % — ABNORMAL LOW (ref 36.0–46.0)
Hemoglobin: 10.4 g/dL — ABNORMAL LOW (ref 12.0–15.0)
Immature Granulocytes: 0 %
Lymphocytes Relative: 20 %
Lymphs Abs: 0.9 10*3/uL (ref 0.7–4.0)
MCH: 31 pg (ref 26.0–34.0)
MCHC: 33.8 g/dL (ref 30.0–36.0)
MCV: 91.9 fL (ref 80.0–100.0)
Monocytes Absolute: 0.5 10*3/uL (ref 0.1–1.0)
Monocytes Relative: 10 %
Neutro Abs: 3 10*3/uL (ref 1.7–7.7)
Neutrophils Relative %: 66 %
Platelets: 152 10*3/uL (ref 150–400)
RBC: 3.35 MIL/uL — ABNORMAL LOW (ref 3.87–5.11)
RDW: 13.3 % (ref 11.5–15.5)
WBC: 4.6 10*3/uL (ref 4.0–10.5)
nRBC: 0 % (ref 0.0–0.2)

## 2020-07-29 LAB — PREPARE PLATELET PHERESIS
Unit division: 0
Unit division: 0

## 2020-07-29 LAB — GLUCOSE, CAPILLARY
Glucose-Capillary: 42 mg/dL — CL (ref 70–99)
Glucose-Capillary: 81 mg/dL (ref 70–99)
Glucose-Capillary: 429 mg/dL — ABNORMAL HIGH (ref 70–99)
Glucose-Capillary: 405 mg/dL — ABNORMAL HIGH (ref 70–99)
Glucose-Capillary: 372 mg/dL — ABNORMAL HIGH (ref 70–99)
Glucose-Capillary: 350 mg/dL — ABNORMAL HIGH (ref 70–99)

## 2020-07-29 MED ORDER — BLOOD GLUCOSE MONITOR KIT: PACK | 0 refills | Status: AC

## 2020-07-29 MED ORDER — INSULIN GLARGINE 100 UNIT/ML ~~LOC~~ SOLN: 15.0000 [IU] | Freq: Every day | SUBCUTANEOUS | 0 refills | Status: DC

## 2020-07-29 MED ORDER — LEVETIRACETAM 500 MG PO TABS: 500.0000 mg | ORAL_TABLET | Freq: Two times a day (BID) | ORAL | 0 refills | Status: AC

## 2020-07-29 MED ORDER — AMLODIPINE BESYLATE 10 MG PO TABS: 10.0000 mg | ORAL_TABLET | Freq: Every day | ORAL | 0 refills | Status: AC

## 2020-07-29 MED ORDER — INSULIN GLARGINE 100 UNIT/ML ~~LOC~~ SOLN: 7.0000 [IU] | Freq: Two times a day (BID) | SUBCUTANEOUS | 0 refills | Status: DC

## 2020-07-29 MED ORDER — PROPRANOLOL HCL 10 MG PO TABS: 10.0000 mg | ORAL_TABLET | Freq: Three times a day (TID) | ORAL | 0 refills | Status: AC

## 2020-07-29 MED ORDER — INSULIN ASPART 100 UNIT/ML ~~LOC~~ SOLN
5.0000 [IU] | Freq: Once | SUBCUTANEOUS | Status: AC
  Administered 2020-07-29: 15:00:00 5 [IU] via SUBCUTANEOUS
  Filled 2020-07-29: qty 1

## 2020-07-29 MED ORDER — INSULIN LISPRO (1 UNIT DIAL) 100 UNIT/ML (KWIKPEN): 2.0000 [IU] | PEN_INJECTOR | Freq: Three times a day (TID) | SUBCUTANEOUS | 0 refills | Status: DC

## 2020-07-29 NOTE — Progress Notes (Signed)
Patient discharged home via POV. After Visit Summary reviewed with patient. Patient had no questions.

## 2020-07-29 NOTE — Plan of Care (Signed)
°  Problem: Education: Goal: Knowledge of General Education information will improve Description: Including pain rating scale, medication(s)/side effects and non-pharmacologic comfort measures Outcome: Adequate for Discharge   Problem: Health Behavior/Discharge Planning: Goal: Ability to manage health-related needs will improve Outcome: Adequate for Discharge   Problem: Clinical Measurements: Goal: Ability to maintain clinical measurements within normal limits will improve Outcome: Adequate for Discharge Goal: Will remain free from infection Outcome: Adequate for Discharge Goal: Diagnostic test results will improve Outcome: Adequate for Discharge Goal: Respiratory complications will improve Outcome: Adequate for Discharge Goal: Cardiovascular complication will be avoided Outcome: Adequate for Discharge   Problem: Activity: Goal: Risk for activity intolerance will decrease Outcome: Adequate for Discharge   Problem: Nutrition: Goal: Adequate nutrition will be maintained Outcome: Adequate for Discharge   Problem: Coping: Goal: Level of anxiety will decrease Outcome: Adequate for Discharge   Problem: Elimination: Goal: Will not experience complications related to bowel motility Outcome: Adequate for Discharge Goal: Will not experience complications related to urinary retention Outcome: Adequate for Discharge   Problem: Pain Managment: Goal: General experience of comfort will improve Outcome: Adequate for Discharge   Problem: Safety: Goal: Ability to remain free from injury will improve Outcome: Adequate for Discharge   Problem: Skin Integrity: Goal: Risk for impaired skin integrity will decrease Outcome: Adequate for Discharge   Problem: Education: Goal: Knowledge of disease or condition will improve Outcome: Adequate for Discharge Goal: Knowledge of secondary prevention will improve Outcome: Adequate for Discharge Goal: Knowledge of patient specific risk factors  addressed and post discharge goals established will improve Outcome: Adequate for Discharge   Problem: Coping: Goal: Will verbalize positive feelings about self Outcome: Adequate for Discharge Goal: Will identify appropriate support needs Outcome: Adequate for Discharge   Problem: Health Behavior/Discharge Planning: Goal: Ability to manage health-related needs will improve Outcome: Adequate for Discharge   Problem: Self-Care: Goal: Ability to participate in self-care as condition permits will improve Outcome: Adequate for Discharge Goal: Verbalization of feelings and concerns over difficulty with self-care will improve Outcome: Adequate for Discharge Goal: Ability to communicate needs accurately will improve Outcome: Adequate for Discharge   Problem: Nutrition: Goal: Risk of aspiration will decrease Outcome: Adequate for Discharge Goal: Dietary intake will improve Outcome: Adequate for Discharge   Problem: Intracerebral Hemorrhage Tissue Perfusion: Goal: Complications of Intracerebral Hemorrhage will be minimized Outcome: Adequate for Discharge

## 2020-07-29 NOTE — TOC Transition Note (Signed)
Transition of Care [facility]) - CM/SW Discharge Note   Patient Details  Name: Susan Navarro MRN: [id number] Date of Birth: 1961/01/09  Transition of Care [facility]) CM/SW Contact:  Boris Sharper, LCSW Phone Number: 07/29/2020, 9:38 AM   Clinical Narrative:    Pt medically stable for discharge per MD. Pt will be transported home by her sister. [facility] to follow for PT and OT. CSW notified Drue Novel with [facility] of discharge.   Final next level of care: [place] Barriers to Discharge: No Barriers Identified   Patient Goals and CMS Choice     Choice offered to / list presented to : NA  Discharge Placement                  Name of family member notified: Vana Patient and family notified of of transfer: 07/29/20  Discharge Plan and Services     Post Acute Care Choice: Resumption of Svcs/PTA Provider                    HH Arranged: PT, OT [place] Agency: [facility] Date [place]: 07/29/20 Time [place]: [phone] Representative spoke with at [place]: [place] ([place]) Interventions     Readmission Risk Interventions Readmission Risk Prevention Plan 07/26/2020 12/28/2019  Transportation Screening Complete Complete  HRI or [place] Complete Complete  Social Work Consult for [place] Planning/Counseling Complete -  [place] Screening Not Applicable Not Applicable  Medication Review Press photographer) Complete Referral to Pharmacy  Some recent data might be hidden

## 2020-07-29 NOTE — Progress Notes (Signed)
Brief Neuro Update:  Briefly, Ms. Charlott Whiteis a 60 y.o.femalewith PMH significant for poorly controlled diabetes, hypertension, hyperlipidemia, prior strokes which patient is not aware of who presents with confusion over the last few days and episodes of staring off. CT head in the ED concerning for a left frontal infarct with some hemorrhagic transformation. Repeat CT head shows some questionable progression of the hemorrhage which was stable on subsequent CT Head. Multiple Neuro exams with no deficit noted.  Recs: - Follow up with PCP within 1 week of discharge and get a repeat CTH and repeat CBC. If the patient does not have any worsening of ICH on CTH and if the CBC shows platelets above 50K, then she should be started back on Aspirin 21m daily. Both patient and daughter are aware. - Follow up with Dr. SLeonie Manin stroke clinic. - Neurology inpatient team will signoff. Please feel free to contact [place] any questions or concerns.  SAlvaradoPager Number [phone]

## 2020-07-29 NOTE — Discharge Summary (Signed)
Physician Discharge Summary  Susan Navarro AUQ:[id number] DOB: [birth year] DOA: 07/24/2020  PCP: Susan Brome, MD  Admit date: 07/24/2020 Discharge date: 07/29/2020  Time spent: 20 minutes  Recommendations for Outpatient Follow-up:  1. PCP f/u in one week.  2. Neurology f/u as scheduled for cva and seizures.   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 Goal BP Systolic of 638. -Off anticoagulation. -Manage thrombocytopenia, pt given platelets with goal of >100. -Cont statin therapy.  neurologically pt is fortunately alert and awake and without any deficit.  No change in regimen.d/c plan per neuro plan.  Stable on repeat imaging.  Per neurology recommendation: -Recommend that patient follow up wti the PCP within 1 week of discharge and get a repeat CTH and repeat CBC. If the patient does not have any worsening of ICH on CTH and if the CBC shows platelets above 50K, then she should be started back on Aspirin 88m daily. Both patient and daughter are aware. - Follow up with Dr. [facility] stroke clinic. - pt is to continue keppra for suspected seizures per neurology recommendation.   CKD: -Attibute to htn. -avoid nephrotoxic meds and studies.  -creatinine is 1.56 today.   Cirrhosis of liver/ Thrombocytopenia: - stable / INR wnl. - platelet transfused and will follow and repeat levels. - 2 units of platelets today.   DM type I: -a1c is not at goal -9.5 this admission.  -home regimen with Lantus at 15 units  continued and SSI.  HTN: -amlodipine restarted and goal 140-160 SBP. -propranolol added 10 mg tid.  -discharge plan with  amlodipine and propranolol.    Anemia: Pt has been anemic since 2017 and attribute to ACD and renal function is improving.  H/h is stable PCP to follow and manage further.   Discharge Condition:  Good  Diet recommendation:  Cardiac sodium restricted diet.  Filed Weights   07/27/20 0500 07/28/20 0500 07/29/20 0500  Weight: 59.7 kg 61 kg 61.8 kg    History of present illness:  VLeza Apseyis a 59y.o. female [facility] three days of difficulty with word finding, trouble speaking, and confusion.  She reports that her daughter brought her to the ED after she was unable to answer questions.  She had a head CT completed which showed a "new area of low attenuation in the left frontal region with associated sulcal effacement and focal hyperdense material suggesting hemorrhage".   On exam, she denies any headaches, blurry vision, nausea, however while examining her, initially, her speech was clear and she had no difficulty with word finding, however approximately 10 minutes into the examination she did become acutely aphasic and also had some trouble following commands.  This was temporary and lasted only a few minutes.  She reports that this is similar to what has been going on for the last few days and prompted her to come to the ED.  She does have a [facility] of [facility] whom she follows up with. She is on ASA 812mdaily.  [place]  1[place] patient had  follow up CT  Head 8/3 730 AM SAH shows progressive disease  Follow-up head CT does show some progression of bleeding but still with no mass-effect or midline shift, appears most consistent with hemorrhagic stroke per radiology.   ER spoke with Dr. Lacinda Axon of neurosurgery and no operative intervention indicated at this time, he recommends discussion with neurology.   ER spoke with Dr. Johnella Moloney neurology, who recommends maintaining systolic blood  pressure less than 140,platelet transfusion to keep greater than 100,hold aspirin and DVT prophylaxis.    Hospital Course:  Pt was admitted to icu for left frontal infarct with hemorrhagic transformation.Pt was also stated on seizure suspicion and t/t as she was having episode of starting off in to space, EEG was negative for any seizure like activity. Pt has repeat imaging with ct which showed stable hemorrhage with no increase. Pt was being managed by neurology and icu and was transferred to hospitalist service on  07/26/20 and her platelet count were dropping and was given platelet 2 units and hematology was consulted and attributed to her cirrhosis. The next day pt received 2 additional units of platelets and  Her count same up to 109k. Pt's has been stable and has been eating and ambulating without any vision or balance issues. Pt has done well with PT with recs for discharge home with home health.  I have started pt on propranolol and her platelet count has improved. For discharge plan will d/c pt on current regimen.  Daughter was concerned about mom being discharged prematurely  And d/w her about plan based on PT /OT/ Specialist and myself, we will d/c pt to approparite level of care after she has been evaluated and managend by careteam. Per cw She has d/w pt about rides and f/u appt and [facility] care has been set up already.   Procedures: EEG 07/24/20: no Seizure like activity.  Consultations: Neurology-Dr.Khaliqdina.  Discharge Exam: Vitals:   07/29/20 0443 07/29/20 0734  BP: (!) 143/82 (!) 145/78  Pulse: 84 84  Resp: 16 17  Temp: (!) 97.5 F (36.4 C) 98.3 F (36.8 C)  SpO2: 100% 100%   Physical Exam Vitals reviewed.  Constitutional:      General: She is not in acute distress.    Appearance: She is normal weight. She is not ill-appearing, toxic-appearing or diaphoretic.  HENT:     Head: Normocephalic and atraumatic.     Right Ear: External ear normal.     Left Ear: External ear  normal.     Mouth/Throat:     Pharynx: Oropharynx is clear.  Eyes:     Extraocular Movements: Extraocular movements intact.  Cardiovascular:     Rate and Rhythm: Normal rate and regular rhythm.     Pulses: Normal pulses.     Heart sounds: Normal heart sounds.  Pulmonary:     Effort: Pulmonary effort is normal.     Breath sounds: Normal breath sounds.  Abdominal:     General: Abdomen is flat. Bowel sounds are normal.     Palpations: Abdomen is soft.  Musculoskeletal:        General: Normal range of motion.     Cervical back: Normal range of motion.  Skin:    General: Skin is warm and dry.  Neurological:     General: No focal deficit present.     Mental Status: She is alert and oriented to person, place, and time. Mental status is at baseline.     Cranial Nerves: No cranial nerve  deficit.     Motor: No weakness.     Coordination: Coordination normal.    Discharge Instructions Discharge Instructions    Call MD for:  difficulty breathing, headache or visual disturbances   Complete by: As directed    Call MD for:  persistant dizziness or light-headedness   Complete by: As directed    Call MD for:  persistant nausea and vomiting   Complete by: As directed    Call MD for:  redness, tenderness, or signs of infection (pain, swelling, redness, odor or green/yellow discharge around incision site)   Complete by: As directed    Call MD for:  temperature >100.4   Complete by: As directed    Diet - low sodium heart healthy   Complete by: As directed    Diet Carb Modified   Complete by: As directed    Discharge instructions   Complete by: As directed    Please take all meds as prescribed. DO NOT TAKE ANY NSAID;s.   Driving Restrictions   Complete by: As directed    Driving privilege needs to be reassessed by pcp and neuro upon follow up until then plz refrain from driving.   Increase activity slowly   Complete by: As directed    No wound care   Complete by: As directed       Allergies as of 07/29/2020   No Known Allergies     Medication List    STOP taking these medications   Lantus SoloStar 100 UNIT/ML Solostar Pen Generic drug: insulin glargine Replaced by: insulin glargine 100 UNIT/ML injection   lisinopril 2.5 MG tablet Commonly known as: ZESTRIL     TAKE these medications   amLODipine 10 MG tablet Commonly known as: NORVASC Take 1 tablet (10 mg total) by mouth daily. Start taking on: July 30, 2020 What changed:   medication strength  how much to take   atorvastatin 80 MG tablet Commonly known as: LIPITOR Take 80 mg by mouth daily.   Ensure Max Protein Liqd Take 330 mLs (11 oz total) by mouth 2 (two) times daily between meals.   insulin glargine 100 UNIT/ML injection Commonly known as: LANTUS Inject 0.15 mLs (15 Units total) into the skin at bedtime. Replaces: Lantus SoloStar 100 UNIT/ML Solostar Pen   insulin lispro 100 UNIT/ML KwikPen Commonly known as: HumaLOG KwikPen Inject 0.02-0.03 mLs (2-3 Units total) into the skin 3 (three) times daily before meals.   Insulin Syringe-Needle U-100 30G X 5/16" 0.5 ML Misc Commonly known as: HealthWise Insulin Syr/Needle 1 each by Does not apply route 3 (three) times daily with meals.   levETIRAcetam 500 MG tablet Commonly known as: KEPPRA Take 1 tablet (500 mg total) by mouth 2 (two) times daily.   lidocaine 5 % Commonly known as: LIDODERM Place 1 patch onto the skin daily. Remove & Discard patch within 12 hours or as directed by MD   multivitamin with minerals Tabs tablet Take 1 tablet by mouth daily.   propranolol 10 MG tablet Commonly known as: INDERAL Take 1 tablet (10 mg total) by mouth 3 (three) times daily.      No Known Allergies  Follow-up Information    Susan Brome, MD Follow up.   Specialty: Internal Medicine Contact information: [address] [facility] [place] [address] [phone]              The results of  significant diagnostics from this hospitalization (including imaging, microbiology, ancillary  and laboratory) are listed below for reference.    Significant Diagnostic Studies: EEG  Result Date: 07/24/2020 Lora Havens, MD     07/24/2020  4:26 PM Patient Name: Susan Navarro MRN: [id number] Epilepsy Attending: Lora Havens Referring Physician/Provider: Dr Donnetta Simpers Date: 07/24/2020 Duration: 22.46 mins Patient history: 59yo F presented with confusion, staring off. EEG to evaluate for seizure Level of alertness: awake AEDs during EEG study: LEV Technical aspects: This EEG study was done with scalp electrodes positioned according to the 10-20 International system of electrode placement. Electrical activity was acquired at a sampling rate of 500Hz  and reviewed with a high frequency filter of 70Hz  and a low frequency filter of 1Hz . EEG data were recorded continuously and digitally stored. Description: No posterior dominant rhythm was seen. EEG showed continuous generalized 3 to 6 Hz theta-delta slowing. Physiology photic driving was not seen during photic stimulation.  Hyperventilation was not performed.   ABNORMALITY -Continuous slow, generalized IMPRESSION: This study is suggestive of moderate diffuse encephalopathy, nonspecific etiology. No seizures or epileptiform discharges were seen throughout the recording. Lora Havens   CT Angio Head W or Wo Contrast  Result Date: 07/24/2020 CLINICAL DATA:  Encephalopathy with intracranial hemorrhage EXAM: CT ANGIOGRAPHY HEAD AND NECK TECHNIQUE: Multidetector CT imaging of the head and neck was performed using the standard protocol during bolus administration of intravenous contrast. Multiplanar CT image reconstructions and MIPs were obtained to evaluate the vascular anatomy. Carotid stenosis measurements (when applicable) are obtained utilizing NASCET criteria, using the distal internal carotid diameter as the denominator. CONTRAST:  64m OMNIPAQUE  IOHEXOL 350 MG/ML SOLN COMPARISON:  None. FINDINGS: CTA NECK FINDINGS SKELETON: There is no bony spinal canal stenosis. No lytic or blastic lesion. OTHER NECK: Normal pharynx, larynx and major salivary glands. No cervical lymphadenopathy. Unremarkable thyroid gland. UPPER CHEST: No pneumothorax or pleural effusion. No nodules or masses. AORTIC ARCH: There is no calcific atherosclerosis of the aortic arch. There is no aneurysm, dissection or hemodynamically significant stenosis of the visualized portion of the aorta. Conventional 3 vessel aortic branching pattern. The visualized proximal subclavian arteries are widely patent. RIGHT CAROTID SYSTEM: Normal without aneurysm, dissection or stenosis. LEFT CAROTID SYSTEM: Normal without aneurysm, dissection or stenosis. VERTEBRAL ARTERIES: Left dominant configuration. Both origins are clearly patent. There is no dissection, occlusion or flow-limiting stenosis to the skull base (V1-V3 segments). CTA HEAD FINDINGS POSTERIOR CIRCULATION: --Vertebral arteries: Normal V4 segments. --Inferior cerebellar arteries: Normal. --Basilar artery: Normal. --Superior cerebellar arteries: Normal. --Posterior cerebral arteries (PCA): Normal. ANTERIOR CIRCULATION: --Intracranial internal carotid arteries: Normal. --Anterior cerebral arteries (ACA): Normal. Both A1 segments are present. Patent anterior communicating artery (a-comm). --Middle cerebral arteries (MCA): Normal. VENOUS SINUSES: As permitted by contrast timing, patent. ANATOMIC VARIANTS: None Review of the MIP images confirms the above findings. IMPRESSION: 1. Normal CTA of the head and neck. No aneurysm, vascular malformation or large vessel occlusion. 2. Unchanged focus of hemorrhage of the left frontal lobe, better characterized on earlier noncontrast study. Electronically Signed   By: KUlyses JarredM.D.   On: 07/24/2020 03:16   CT HEAD WO CONTRAST  Result Date: 07/25/2020 CLINICAL DATA:  Follow-up of intracranial hemorrhage  EXAM: CT HEAD WITHOUT CONTRAST TECHNIQUE: Contiguous axial images were obtained from the base of the skull through the vertex without intravenous contrast. COMPARISON:  None. FINDINGS: Brain: Unchanged small volume subarachnoid hemorrhage over the anterior left convexity. Parenchymal hypoattenuation here is also unchanged. The remainder of the brain is also unchanged. Vascular: Negative Skull: Negative  Sinuses/Orbits: No acute finding. Other: None. IMPRESSION: Unchanged small volume subarachnoid hemorrhage over the anterior left convexity. Electronically Signed   By: Ulyses Jarred M.D.   On: 07/25/2020 03:46   CT Head Wo Contrast  Result Date: 07/24/2020 CLINICAL DATA:  Follow-up intracranial hemorrhage EXAM: CT HEAD WITHOUT CONTRAST TECHNIQUE: Contiguous axial images were obtained from the base of the skull through the vertex without intravenous contrast. COMPARISON:  Earlier today FINDINGS: Brain: Edematous appearance and swelling with cortical/subarachnoid hemorrhage in the anterior and lateral left frontal lobe. The degree of hemorrhage appears increased without discrete hematoma. No progressive swelling and no midline shift. Remote high right frontal, left posterior frontal, and left occipital cortically based infarcts. Small remote right more than left cerebellar infarcts. No hydrocephalus. Vascular: No hyperdense vessel. Skull: Negative Sinuses/Orbits: Bilateral cataract resection IMPRESSION: 1. Progressive parenchymal/subarachnoid hemorrhage along the swollen left frontal lobe, hemorrhagic infarct is again favored. Recommend continued follow-up. 2. Remote cerebral and cerebellar infarcts. Electronically Signed   By: Monte Fantasia M.D.   On: 07/24/2020 07:39   CT Head Wo Contrast  Result Date: 07/24/2020 CLINICAL DATA:  Mental status change of unknown cause. Altered mental status for a few days. EXAM: CT HEAD WITHOUT CONTRAST TECHNIQUE: Contiguous axial images were obtained from the base of the skull  through the vertex without intravenous contrast. COMPARISON:  CT head 12/25/2019.  MRI brain 12/25/2019. FINDINGS: Brain: Encephalomalacia in the right frontoparietal region and left parietal region is unchanged since previous study. New area of low-attenuation in the left frontal region with associated sulcal effacement and focal hyperdense material suggesting hemorrhage. This likely represents an acute or subacute hemorrhagic infarct. No significant midline shift. No abnormal extra-axial collections. Vascular: No hyperdense vessel or unexpected calcification. Skull: Normal. Negative for fracture or focal lesion. Sinuses/Orbits: No acute finding. Other: None. IMPRESSION: 1. New area of low-attenuation in the left frontal region with associated sulcal effacement and focal hyperdense material suggesting hemorrhage. This likely represents an acute or subacute hemorrhagic infarct. 2. Encephalomalacia in the right frontoparietal region and left parietal region is unchanged since previous study, likely old infarcts. Critical Value/emergent results were called by telephone at the time of interpretation on 07/24/2020 at 1:36 am to provider Merlyn Lot , who verbally acknowledged these results. Electronically Signed   By: Lucienne Capers M.D.   On: 07/24/2020 01:39   CT Angio Neck W and/or Wo Contrast  Result Date: 07/24/2020 CLINICAL DATA:  Encephalopathy with intracranial hemorrhage EXAM: CT ANGIOGRAPHY HEAD AND NECK TECHNIQUE: Multidetector CT imaging of the head and neck was performed using the standard protocol during bolus administration of intravenous contrast. Multiplanar CT image reconstructions and MIPs were obtained to evaluate the vascular anatomy. Carotid stenosis measurements (when applicable) are obtained utilizing NASCET criteria, using the distal internal carotid diameter as the denominator. CONTRAST:  20m OMNIPAQUE IOHEXOL 350 MG/ML SOLN COMPARISON:  None. FINDINGS: CTA NECK FINDINGS SKELETON:  There is no bony spinal canal stenosis. No lytic or blastic lesion. OTHER NECK: Normal pharynx, larynx and major salivary glands. No cervical lymphadenopathy. Unremarkable thyroid gland. UPPER CHEST: No pneumothorax or pleural effusion. No nodules or masses. AORTIC ARCH: There is no calcific atherosclerosis of the aortic arch. There is no aneurysm, dissection or hemodynamically significant stenosis of the visualized portion of the aorta. Conventional 3 vessel aortic branching pattern. The visualized proximal subclavian arteries are widely patent. RIGHT CAROTID SYSTEM: Normal without aneurysm, dissection or stenosis. LEFT CAROTID SYSTEM: Normal without aneurysm, dissection or stenosis. VERTEBRAL ARTERIES: Left dominant configuration.  Both origins are clearly patent. There is no dissection, occlusion or flow-limiting stenosis to the skull base (V1-V3 segments). CTA HEAD FINDINGS POSTERIOR CIRCULATION: --Vertebral arteries: Normal V4 segments. --Inferior cerebellar arteries: Normal. --Basilar artery: Normal. --Superior cerebellar arteries: Normal. --Posterior cerebral arteries (PCA): Normal. ANTERIOR CIRCULATION: --Intracranial internal carotid arteries: Normal. --Anterior cerebral arteries (ACA): Normal. Both A1 segments are present. Patent anterior communicating artery (a-comm). --Middle cerebral arteries (MCA): Normal. VENOUS SINUSES: As permitted by contrast timing, patent. ANATOMIC VARIANTS: None Review of the MIP images confirms the above findings. IMPRESSION: 1. Normal CTA of the head and neck. No aneurysm, vascular malformation or large vessel occlusion. 2. Unchanged focus of hemorrhage of the left frontal lobe, better characterized on earlier noncontrast study. Electronically Signed   By: Ulyses Jarred M.D.   On: 07/24/2020 03:16   MR BRAIN WO CONTRAST  Result Date: 07/24/2020 CLINICAL DATA:  Neuro deficit, acute, stroke suspected. Additional provided: 59-year-old female presenting with episode of confusion,  recent fall. EXAM: MRI HEAD WITHOUT CONTRAST TECHNIQUE: Multiplanar, multiecho pulse sequences of the brain and surrounding structures were obtained without intravenous contrast. COMPARISON:  Head CT performed earlier the same day 07/24/2020 CT angiogram head 07/24/2020, brain MRI 12/25/2019. FINDINGS: Brain: Intermittently motion degraded examination. Most notably, there is mild-to-moderate motion degradation of the axial T2/FLAIR sequence and SWI sequence as well as axial T1 weighted sequence. There is moderate predominantly vasogenic edema within the anterolateral left frontal lobe. Within this region and abutting the cortical surface, there is a rounded 1.7 cm T2 intermediate signal (series 10, image 17). There is no definite restricted diffusion at this site. Unchanged overlying small volume subarachnoid hemorrhage. Stable background mild generalized parenchymal atrophy. Redemonstrated remote cortically based infarcts within the posterior right frontal lobe and right frontal operculum, posterior left frontal lobe, bilateral parietal lobes and left occipital lobe. Associated chronic blood products at site of the chronic infarcts within the posterior right frontal lobe and right frontal operculum as well as left parietal lobe. Additional scattered supratentorial chronic microhemorrhages. Chronic lacunar infarcts within the right lentiform nucleus and left thalamus. Numerous small chronic infarcts within the cerebellar hemispheres bilaterally. Background mild chronic small vessel ischemic changes within the cerebral Tasker matter and pons. There is no acute infarct. No midline shift or hydrocephalus. Vascular: Expected proximal arterial flow voids. Skull and upper cervical spine: No focal marrow lesion. Sinuses/Orbits: Visualized orbits show no acute finding. Frothy secretions and mild mucosal thickening within the left sphenoid sinus. No significant mastoid effusion. IMPRESSION: Motion degraded exam.  Moderate-sized region of predominantly vasogenic edema within the anterolateral left frontal lobe. Within this region, and abutting the cortical surface, there is a 1.7 cm circumscribed focus of T2 intermediate signal. There is no appreciable restricted diffusion at this site and there is unchanged overlying small-volume subarachnoid hemorrhage. Findings are suspicious for a possible underlying mass and post-contrast MR imaging of the brain is recommended for further evaluation. Alternatively, these findings may reflect a late subacute infarct with hemorrhagic conversion or parenchymal hematoma with surrounding edema. Multifocal chronic cortically based infarcts within the bilateral cerebral hemispheres as described. Chronic lacunar infarcts within the right basal ganglia, left thalamus and bilateral cerebellar hemispheres. Background mild chronic small vessel ischemic disease within the cerebral Gudiel matter and pons. Mild generalized parenchymal atrophy. Left sphenoid sinusitis. Electronically Signed   By: Kellie Simmering DO   On: 07/24/2020 14:46   MR BRAIN W CONTRAST  Result Date: 07/25/2020 CLINICAL DATA:  Stroke follow-up. EXAM: MRI HEAD WITH CONTRAST TECHNIQUE:  Multiplanar, multiecho pulse sequences of the brain and surrounding structures were obtained with intravenous contrast. CONTRAST:  7.73m GADAVIST GADOBUTROL 1 MMOL/ML IV SOLN COMPARISON:  07/24/2020 MRI head. FINDINGS: Please note that motion artifact limits evaluation. Brain: Mild diffuse parenchymal volume loss with ex vacuo dilatation. No midline shift, ventriculomegaly or extra-axial fluid collection. Redemonstration of left frontal infarct. Associated subarachnoid hemorrhage is less conspicuous. Curvilinear left frontal enhancement and curvilinear right frontal intrinsic T1 hyperintensity reflects cortical laminar necrosis. There is no masslike enhancement to suggest a focal lesion. Sequela of remote insult involving the bilateral frontal,  bilateral parietal and left occipital regions. Smaller remote infarcts are seen involving the right basal ganglia, left thalamus and bilateral cerebellum. Background chronic microvascular ischemic changes are better demonstrated on prior exam. Vascular: Grossly normal flow voids. Skull and upper cervical spine: No focal lesion. Sinuses/Orbits: Normal orbits. Mild left sphenoid sinus disease. No mastoid effusion. Other: None. IMPRESSION: 1. Redemonstration of left frontal infarct with cortical laminar necrosis. No masslike enhancement to suggest focal lesion. 2. Sequela of remote bilateral cerebral, bilateral cerebellar, right basal ganglia and left thalamic infarcts. Right frontal cortical laminar necrosis. 3. Chronic microvascular ischemic changes.  Mild cerebral atrophy. Electronically Signed   By: CPrimitivo GauzeM.D.   On: 07/25/2020 12:05   UKoreaAbdomen Complete  Result Date: 07/28/2020 CLINICAL DATA:  Splenomegaly and thrombocytopenia. Liver biopsy 01/12/2020 EXAM: ABDOMEN ULTRASOUND COMPLETE COMPARISON:  Ultrasound 01/12/2020 FINDINGS: Gallbladder: Mild gallbladder wall thickening is similar prior at 2 mm. Resolution of the Peri cholecystic fluid seen on comparison exam. No gallstones evident. Negative sonographic Murphy's sign Common bile duct: Diameter: Normal at 4 mm. Liver: No focal lesion identified. Within normal limits in parenchymal echogenicity. Portal vein is patent on color Doppler imaging with normal direction of blood flow towards the liver. IVC: No abnormality visualized. Pancreas: Visualized portion unremarkable. Spleen: Spleen appears normal volume at 8.6 cm Right Kidney: Length: 7.5 cm.  No hydronephrosis Left Kidney: Length: 8.9 cm.  No hydronephrosis Abdominal aorta: No aneurysm visualized. Other findings: None. IMPRESSION: 1. Resolution pericholecystic fluid seen on comparison exam. 2. No biliary duct dilatation. 3. Normal volume spleen. Electronically Signed   By: SSuzy Bouchard M.D.   On: 07/28/2020 10:03   ECHOCARDIOGRAM COMPLETE  Result Date: 07/25/2020    ECHOCARDIOGRAM REPORT   Patient Name:   Susan Navarro Date of Exam: 07/24/2020 Medical Rec #:  [id number]    Height:       66.0 in Accession #:    [id number]   Weight:       125.0 lb Date of Birth:  3March 30, 1962     BSA:          1.638 m Patient Age:    63years      BP:           120/55 mmHg Patient Gender: F             HR:           85 bpm. Exam Location:  [facility] Procedure: 2D Echo, Cardiac Doppler and Color Doppler Indications:     I163.9 Stroke  History:         Patient has no prior history of Echocardiogram examinations.                  Risk Factors:Hypertension and Diabetes. Stroke. Chronic kidney                  disease.  Sonographer:  Wilford Sports Rodgers-Jones Referring Phys:  [id number] [place] Diagnosing Phys: Harrell Gave End MD IMPRESSIONS  1. Left ventricular ejection fraction, by estimation, is 60 to 65%. The left ventricle has normal function. Left ventricular endocardial border not optimally defined to evaluate regional wall motion. Left ventricular diastolic parameters are consistent with Grade I diastolic dysfunction (impaired relaxation).  2. Right ventricular systolic function is normal. The right ventricular size is normal. Mildly increased right ventricular wall thickness. There is normal pulmonary artery systolic pressure.  3. 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 No evidence of mitral stenosis.  4. The aortic valve is tricuspid. Aortic valve regurgitation is not visualized. No aortic stenosis is present.  5. The inferior vena cava is normal in size with greater than 50% respiratory variability, suggesting right atrial pressure of 3 mmHg. FINDINGS  Left Ventricle: Left ventricular ejection fraction, by estimation, is 60 to 65%. The left ventricle has normal function. Left ventricular endocardial border not optimally defined to evaluate regional wall motion. The left  ventricular internal cavity size was normal in size. There is no left ventricular hypertrophy. Left ventricular diastolic parameters are consistent with Grade I diastolic dysfunction (impaired relaxation). Right Ventricle: The right ventricular size is normal. Mildly increased right ventricular wall thickness. Right ventricular systolic function is normal. There is normal pulmonary artery systolic pressure. The tricuspid regurgitant velocity is 2.48 m/s, and with an assumed right atrial pressure of 3 mmHg, the estimated right ventricular systolic pressure is 48.5 mmHg. Left Atrium: Left atrial size was normal in size. Right Atrium: Right atrial size was normal in size. Pericardium: Trivial pericardial effusion is present. The pericardial effusion is posterior to the left ventricle. 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 There is mild thickening of the mitral valve leaflet(s). Mild to moderate mitral valve regurgitation. No evidence of mitral valve stenosis. Tricuspid Valve: The tricuspid valve is normal in structure. Tricuspid valve regurgitation is mild. Aortic Valve: The aortic valve is tricuspid. Aortic valve regurgitation is not visualized. No aortic stenosis is present. Pulmonic Valve: The pulmonic valve was grossly normal. Pulmonic valve regurgitation is trivial. No evidence of pulmonic stenosis. Aorta: The aortic root and ascending aorta are structurally normal, with no evidence of dilitation. Pulmonary Artery: The pulmonary artery is not well seen. Venous: The inferior vena cava is normal in size with greater than 50% respiratory variability, suggesting right atrial pressure of 3 mmHg. IAS/Shunts: The interatrial septum was not well visualized.  LEFT VENTRICLE PLAX 2D LVIDd:         4.36 cm  Diastology LVIDs:         2.65 cm  LV e' lateral:   8.59 cm/s LV PW:         0.83 cm  LV E/e' lateral: 7.6 LV IVS:        0.84 cm  LV e' medial:    5.77 cm/s LVOT  diam:     1.90 cm  LV E/e' medial:  11.4 LV SV:         56 LV SV Index:   34 LVOT Area:     2.84 cm  RIGHT VENTRICLE RV Basal diam:  3.96 cm RV S prime:     16.70 cm/s TAPSE (M-mode): 2.4 cm LEFT ATRIUM             Index       RIGHT ATRIUM  Index LA diam:        3.70 cm 2.26 cm/m  RA Area:     12.10 cm LA Vol (A2C):   49.8 ml 30.41 ml/m RA Volume:   31.70 ml  19.36 ml/m LA Vol (A4C):   41.5 ml 25.34 ml/m LA Biplane Vol: 45.9 ml 28.03 ml/m  AORTIC VALVE LVOT Vmax:   103.00 cm/s LVOT Vmean:  71.700 cm/s LVOT VTI:    0.196 m  AORTA Ao Root diam: 3.20 cm Ao Asc diam:  3.20 cm MITRAL VALVE               TRICUSPID VALVE MV Area (PHT): 2.66 cm    TR Peak grad:   24.6 mmHg MV Decel Time: 285 msec    TR Vmax:        248.00 cm/s MV E velocity: 65.60 cm/s MV A velocity: 83.60 cm/s  SHUNTS MV E/A ratio:  0.78        Systemic VTI:  0.20 m                            Systemic Diam: 1.90 cm Susan Bush MD Electronically signed by Susan Bush MD Signature Date/Time: 07/25/2020/12:28:15 AM    Final    Microbiology: Recent Results (from the past 240 hour(s))  SARS Coronavirus 2 by RT PCR (hospital order, performed in [place] hospital lab) Nasopharyngeal Nasopharyngeal Swab     Status: None   Collection Time: 07/24/20  8:39 AM   Specimen: Nasopharyngeal Swab  Result Value Ref Range Status   SARS Coronavirus 2 NEGATIVE NEGATIVE Final    Comment: (NOTE) SARS-CoV-2 target nucleic acids are NOT DETECTED.  The SARS-CoV-2 RNA is generally detectable in upper and lower respiratory specimens during the acute phase of infection. The lowest concentration of SARS-CoV-2 viral copies this assay can detect is 250 copies / mL. A negative result does not preclude SARS-CoV-2 infection and should not be used as the sole basis for treatment or other patient management decisions.  A negative result may occur with improper specimen collection / handling, submission of specimen other than nasopharyngeal swab,  presence of viral mutation(s) within the areas targeted by this assay, and inadequate number of viral copies (<250 copies / mL). A negative result must be combined with clinical observations, patient history, and epidemiological information.  Fact Sheet for Patients:   StrictlyIdeas.no  Fact Sheet for Healthcare Providers: BankingDealers.co.za  This test is not yet approved or  cleared by the [place] FDA and has been authorized for detection and/or diagnosis of SARS-CoV-2 by FDA under an Emergency Use Authorization (EUA).  This EUA will remain in effect (meaning this test can be used) for the duration of the COVID-19 declaration under Section 564(b)(1) of the Act, 21 U.S.C. section 360bbb-3(b)(1), unless the authorization is terminated or revoked sooner.  Performed at [facility], [address] [place], [place] [address]   MRSA PCR Screening     Status: None   Collection Time: 07/25/20 12:21 AM   Specimen: Nasal Mucosa; Nasopharyngeal  Result Value Ref Range Status   MRSA by PCR NEGATIVE NEGATIVE Final    Comment:        The GeneXpert MRSA Assay (FDA approved for NASAL specimens only), is one component of a comprehensive MRSA colonization surveillance program. It is not intended to diagnose MRSA infection nor to guide or monitor treatment for MRSA infections. Performed at [facility], [address] [place], [place] [address]     Labs: Basic Metabolic Panel: Recent Labs  Lab 07/24/20 0048 07/24/20 0048 07/24/20 1545 07/24/20 2249 07/25/20 0421 07/26/20 0437 07/27/20 0805  NA 131*  --  139  --  141 139 138  K 4.9   < > 3.8 3.5 4.4 4.7 4.3  CL 91*  --  104  --  109 106 103  CO2 27  --  25  --  25 26 27   GLUCOSE 549*  --  136*  --  173* 242* 191*  BUN 68*  --  48*  --  45* 33* 30*  CREATININE 2.30*  --  1.56*  --  1.69* 1.46* 1.56*  CALCIUM 9.9  --  9.0  --  8.6* 9.0 9.0  MG  --   --    --   --  1.9 1.6* 1.8  PHOS  --   --   --   --  2.4* 2.8  --    < > = values in this interval not displayed.   Liver Function Tests: Recent Labs  Lab 07/24/20 0048  AST 18  ALT 71*  ALKPHOS 608*  BILITOT 0.8  PROT 7.8  ALBUMIN 4.1  CBC: Recent Labs  Lab 07/26/20 0437 07/26/20 0437 07/26/20 1221 07/27/20 0805 07/28/20 0921 07/28/20 2055 07/29/20 0612  WBC 3.0*   < > 3.2* 3.2* 2.4* 3.8* 4.6  NEUTROABS 1.6*  --  2.3 1.7 1.3*  --  3.0  HGB 9.7*   < > 11.0* 10.2* 9.6* 9.5* 10.4*  HCT 28.4*   < > 33.2* 29.6* 28.3* 29.4* 30.8*  MCV 91.9   < > 93.0 91.9 92.5 95.5 91.9  PLT 77*   < > 116* 96* 88* 99* 152   < > = values in this interval not displayed.    CBG: Recent Labs  Lab 07/28/20 1142 07/28/20 1607 07/28/20 2139 07/29/20 0730 07/29/20 0812  GLUCAP 218* 357* 382* 42* 81     Signed: Para Skeans MD.  [facility] 07/29/2020, 11:47 AM

## 2020-07-30 LAB — H PYLORI, IGM, IGG, IGA AB
H Pylori IgG: 0.26 Index Value (ref 0.00–0.79)
H. Pylogi, Iga Abs: 14.9 units — ABNORMAL HIGH (ref 0.0–8.9)
H. Pylogi, Igm Abs: <9 units (ref 0.0–8.9)

## 2020-08-07 ENCOUNTER — Observation Stay: Payer: Medicare HMO

## 2020-08-07 ENCOUNTER — Emergency Department: Payer: Medicare HMO

## 2020-08-07 ENCOUNTER — Other Ambulatory Visit: Payer: Self-pay

## 2020-08-07 ENCOUNTER — Observation Stay
Admission: EM | Admit: 2020-08-07 | Discharge: 2020-08-09 | Disposition: A | Discharge status: Home / Self Care | Payer: Medicare HMO | Attending: Internal Medicine | Admitting: Internal Medicine

## 2020-08-07 DIAGNOSIS — E1165 Type 2 diabetes mellitus with hyperglycemia: Principal | ICD-10-CM | POA: Insufficient documentation

## 2020-08-07 DIAGNOSIS — E1100 Type 2 diabetes mellitus with hyperosmolarity without nonketotic hyperglycemic-hyperosmolar coma (NKHHC): Secondary | ICD-10-CM | POA: Diagnosis present

## 2020-08-07 DIAGNOSIS — D631 Anemia in chronic kidney disease: Secondary | ICD-10-CM | POA: Insufficient documentation

## 2020-08-07 DIAGNOSIS — E871 Hypo-osmolality and hyponatremia: Secondary | ICD-10-CM | POA: Insufficient documentation

## 2020-08-07 DIAGNOSIS — Z20822 Contact with and (suspected) exposure to covid-19: Secondary | ICD-10-CM | POA: Diagnosis not present

## 2020-08-07 DIAGNOSIS — I10 Essential (primary) hypertension: Secondary | ICD-10-CM | POA: Diagnosis not present

## 2020-08-07 DIAGNOSIS — E785 Hyperlipidemia, unspecified: Secondary | ICD-10-CM | POA: Insufficient documentation

## 2020-08-07 DIAGNOSIS — K7469 Other cirrhosis of liver: Secondary | ICD-10-CM | POA: Diagnosis present

## 2020-08-07 DIAGNOSIS — I4581 Long QT syndrome: Secondary | ICD-10-CM | POA: Insufficient documentation

## 2020-08-07 DIAGNOSIS — E1029 Type 1 diabetes mellitus with other diabetic kidney complication: Secondary | ICD-10-CM | POA: Diagnosis present

## 2020-08-07 DIAGNOSIS — N178 Other acute kidney failure: Secondary | ICD-10-CM | POA: Diagnosis not present

## 2020-08-07 DIAGNOSIS — I639 Cerebral infarction, unspecified: Secondary | ICD-10-CM | POA: Diagnosis not present

## 2020-08-07 DIAGNOSIS — R9431 Abnormal electrocardiogram [ECG] [EKG]: Secondary | ICD-10-CM

## 2020-08-07 DIAGNOSIS — Z794 Long term (current) use of insulin: Secondary | ICD-10-CM | POA: Diagnosis not present

## 2020-08-07 DIAGNOSIS — IMO0002 Reserved for concepts with insufficient information to code with codable children: Secondary | ICD-10-CM | POA: Diagnosis present

## 2020-08-07 DIAGNOSIS — D638 Anemia in other chronic diseases classified elsewhere: Secondary | ICD-10-CM | POA: Diagnosis not present

## 2020-08-07 DIAGNOSIS — G9341 Metabolic encephalopathy: Secondary | ICD-10-CM | POA: Diagnosis not present

## 2020-08-07 DIAGNOSIS — R739 Hyperglycemia, unspecified: Secondary | ICD-10-CM

## 2020-08-07 DIAGNOSIS — K7460 Unspecified cirrhosis of liver: Secondary | ICD-10-CM | POA: Insufficient documentation

## 2020-08-07 DIAGNOSIS — E1110 Type 2 diabetes mellitus with ketoacidosis without coma: Secondary | ICD-10-CM | POA: Diagnosis not present

## 2020-08-07 DIAGNOSIS — N184 Chronic kidney disease, stage 4 (severe): Secondary | ICD-10-CM

## 2020-08-07 DIAGNOSIS — Z79899 Other long term (current) drug therapy: Secondary | ICD-10-CM | POA: Insufficient documentation

## 2020-08-07 DIAGNOSIS — D696 Thrombocytopenia, unspecified: Secondary | ICD-10-CM | POA: Diagnosis not present

## 2020-08-07 DIAGNOSIS — R778 Other specified abnormalities of plasma proteins: Secondary | ICD-10-CM | POA: Diagnosis present

## 2020-08-07 DIAGNOSIS — N179 Acute kidney failure, unspecified: Secondary | ICD-10-CM | POA: Diagnosis present

## 2020-08-07 DIAGNOSIS — I82411 Acute embolism and thrombosis of right femoral vein: Secondary | ICD-10-CM

## 2020-08-07 DIAGNOSIS — I129 Hypertensive chronic kidney disease with stage 1 through stage 4 chronic kidney disease, or unspecified chronic kidney disease: Secondary | ICD-10-CM | POA: Diagnosis not present

## 2020-08-07 DIAGNOSIS — I609 Nontraumatic subarachnoid hemorrhage, unspecified: Secondary | ICD-10-CM

## 2020-08-07 LAB — URINALYSIS, COMPLETE (UACMP) WITH MICROSCOPIC
Bacteria, UA: NONE SEEN
Bilirubin Urine: NEGATIVE
Glucose, UA: >=500 mg/dL — AB
Hgb urine dipstick: NEGATIVE
Ketones, ur: NEGATIVE mg/dL
Leukocytes,Ua: NEGATIVE
Nitrite: NEGATIVE
Protein, ur: NEGATIVE mg/dL
Specific Gravity, Urine: 1.015 (ref 1.005–1.030)
pH: 6 (ref 5.0–8.0)

## 2020-08-07 LAB — GLUCOSE, CAPILLARY
Glucose-Capillary: >600 mg/dL (ref 70–99)
Glucose-Capillary: >600 mg/dL (ref 70–99)
Glucose-Capillary: >600 mg/dL (ref 70–99)
Glucose-Capillary: >600 mg/dL (ref 70–99)
Glucose-Capillary: >600 mg/dL (ref 70–99)
Glucose-Capillary: 425 mg/dL — ABNORMAL HIGH (ref 70–99)
Glucose-Capillary: 509 mg/dL (ref 70–99)
Glucose-Capillary: 207 mg/dL — ABNORMAL HIGH (ref 70–99)
Glucose-Capillary: 156 mg/dL — ABNORMAL HIGH (ref 70–99)

## 2020-08-07 LAB — BASIC METABOLIC PANEL
Anion gap: 15 (ref 5–15)
Anion gap: 14 (ref 5–15)
Anion gap: 11 (ref 5–15)
BUN: 59 mg/dL — ABNORMAL HIGH (ref 6–20)
BUN: 57 mg/dL — ABNORMAL HIGH (ref 6–20)
BUN: 53 mg/dL — ABNORMAL HIGH (ref 6–20)
CO2: 20 mmol/L — ABNORMAL LOW (ref 22–32)
CO2: 22 mmol/L (ref 22–32)
CO2: 29 mmol/L (ref 22–32)
Calcium: 9.2 mg/dL (ref 8.9–10.3)
Calcium: 9.5 mg/dL (ref 8.9–10.3)
Calcium: 10.1 mg/dL (ref 8.9–10.3)
Chloride: 87 mmol/L — ABNORMAL LOW (ref 98–111)
Chloride: 91 mmol/L — ABNORMAL LOW (ref 98–111)
Chloride: 100 mmol/L (ref 98–111)
Creatinine, Ser: 1.93 mg/dL — ABNORMAL HIGH (ref 0.44–1.00)
Creatinine, Ser: 1.75 mg/dL — ABNORMAL HIGH (ref 0.44–1.00)
Creatinine, Ser: 1.47 mg/dL — ABNORMAL HIGH (ref 0.44–1.00)
GFR calc Af Amer: 32 mL/min — ABNORMAL LOW (ref >60)
GFR calc Af Amer: 36 mL/min — ABNORMAL LOW (ref >60)
GFR calc Af Amer: 45 mL/min — ABNORMAL LOW (ref >60)
GFR calc non Af Amer: 28 mL/min — ABNORMAL LOW (ref >60)
GFR calc non Af Amer: 31 mL/min — ABNORMAL LOW (ref >60)
GFR calc non Af Amer: 38 mL/min — ABNORMAL LOW (ref >60)
Glucose, Bld: 1166 mg/dL (ref 70–99)
Glucose, Bld: 961 mg/dL (ref 70–99)
Glucose, Bld: 149 mg/dL — ABNORMAL HIGH (ref 70–99)
Potassium: 4.9 mmol/L (ref 3.5–5.1)
Potassium: 5.2 mmol/L — ABNORMAL HIGH (ref 3.5–5.1)
Potassium: 3.5 mmol/L (ref 3.5–5.1)
Sodium: 122 mmol/L — ABNORMAL LOW (ref 135–145)
Sodium: 127 mmol/L — ABNORMAL LOW (ref 135–145)
Sodium: 140 mmol/L (ref 135–145)

## 2020-08-07 LAB — CBC
HCT: 29.5 % — ABNORMAL LOW (ref 36.0–46.0)
Hemoglobin: 9.7 g/dL — ABNORMAL LOW (ref 12.0–15.0)
MCH: 31.5 pg (ref 26.0–34.0)
MCHC: 32.9 g/dL (ref 30.0–36.0)
MCV: 95.8 fL (ref 80.0–100.0)
Platelets: 95 10*3/uL — ABNORMAL LOW (ref 150–400)
RBC: 3.08 MIL/uL — ABNORMAL LOW (ref 3.87–5.11)
RDW: 12.9 % (ref 11.5–15.5)
WBC: 5.1 10*3/uL (ref 4.0–10.5)
nRBC: 0 % (ref 0.0–0.2)

## 2020-08-07 LAB — HEPATIC FUNCTION PANEL
ALT: 99 U/L — ABNORMAL HIGH (ref 0–44)
AST: 92 U/L — ABNORMAL HIGH (ref 15–41)
Albumin: 3.7 g/dL (ref 3.5–5.0)
Alkaline Phosphatase: 623 U/L — ABNORMAL HIGH (ref 38–126)
Bilirubin, Direct: 0.4 mg/dL — ABNORMAL HIGH (ref 0.0–0.2)
Indirect Bilirubin: 1 mg/dL — ABNORMAL HIGH (ref 0.3–0.9)
Total Bilirubin: 1.4 mg/dL — ABNORMAL HIGH (ref 0.3–1.2)
Total Protein: 6.7 g/dL (ref 6.5–8.1)

## 2020-08-07 LAB — BLOOD GAS, VENOUS
Acid-base deficit: 3 mmol/L — ABNORMAL HIGH (ref 0.0–2.0)
Bicarbonate: 22.7 mmol/L (ref 20.0–28.0)
O2 Saturation: 57 %
Patient temperature: 37
pCO2, Ven: 42 mmHg — ABNORMAL LOW (ref 44.0–60.0)
pH, Ven: 7.34 (ref 7.250–7.430)
pO2, Ven: 32 mmHg (ref 32.0–45.0)

## 2020-08-07 LAB — URINE DRUG SCREEN, QUALITATIVE (ARMC ONLY)
Amphetamines, Ur Screen: NOT DETECTED
Barbiturates, Ur Screen: NOT DETECTED
Benzodiazepine, Ur Scrn: NOT DETECTED
Cannabinoid 50 Ng, Ur ~~LOC~~: NOT DETECTED
Cocaine Metabolite,Ur ~~LOC~~: NOT DETECTED
MDMA (Ecstasy)Ur Screen: NOT DETECTED
Methadone Scn, Ur: NOT DETECTED
Opiate, Ur Screen: NOT DETECTED
Phencyclidine (PCP) Ur S: NOT DETECTED
Tricyclic, Ur Screen: NOT DETECTED

## 2020-08-07 LAB — TROPONIN I (HIGH SENSITIVITY)
Troponin I (High Sensitivity): 53 ng/L — ABNORMAL HIGH (ref <18)
Troponin I (High Sensitivity): 86 ng/L — ABNORMAL HIGH (ref <18)
Troponin I (High Sensitivity): 108 ng/L (ref <18)

## 2020-08-07 LAB — BRAIN NATRIURETIC PEPTIDE: B Natriuretic Peptide: 378.7 pg/mL — ABNORMAL HIGH (ref 0.0–100.0)

## 2020-08-07 LAB — SARS CORONAVIRUS 2 BY RT PCR (HOSPITAL ORDER, PERFORMED IN ~~LOC~~ HOSPITAL LAB): SARS Coronavirus 2: NEGATIVE

## 2020-08-07 LAB — MAGNESIUM: Magnesium: 1.6 mg/dL — ABNORMAL LOW (ref 1.7–2.4)

## 2020-08-07 LAB — OSMOLALITY: Osmolality: 344 mOsm/kg (ref 275–295)

## 2020-08-07 LAB — PHOSPHORUS: Phosphorus: 2.1 mg/dL — ABNORMAL LOW (ref 2.5–4.6)

## 2020-08-07 LAB — AMMONIA: Ammonia: 30 umol/L (ref 9–35)

## 2020-08-07 LAB — BETA-HYDROXYBUTYRIC ACID: Beta-Hydroxybutyric Acid: 1.62 mmol/L — ABNORMAL HIGH (ref 0.05–0.27)

## 2020-08-07 LAB — TSH: TSH: 1.593 u[IU]/mL (ref 0.350–4.500)

## 2020-08-07 IMAGING — MR MR HEAD W/O CM
11 series · 43 of 48 positions shown · non-contrast
Comparison: Head CT [DATE]

CLINICAL DATA: Cerebellar infarct

EXAM:
MRI HEAD WITHOUT CONTRAST
TECHNIQUE: Multiplanar, multiecho pulse sequences of the brain and surrounding
structures were obtained without intravenous contrast.

[Series 5: ax dwi_tracew · axial · 3.0mm · 0.60mm/px · z∈[-99,+53]mm · 5 of 48 slices shown]
[im 1/48]
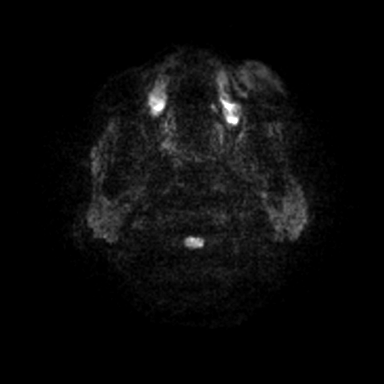
[im 12/48]
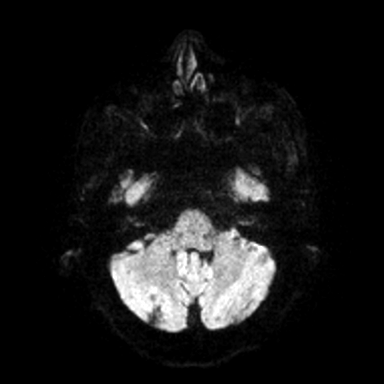
[im 24/48]
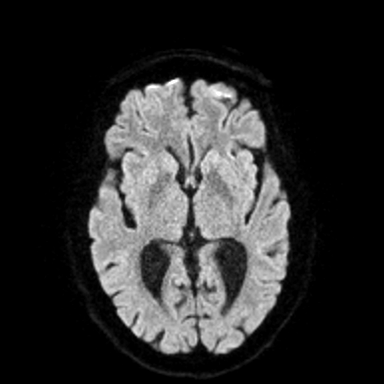
[im 36/48]
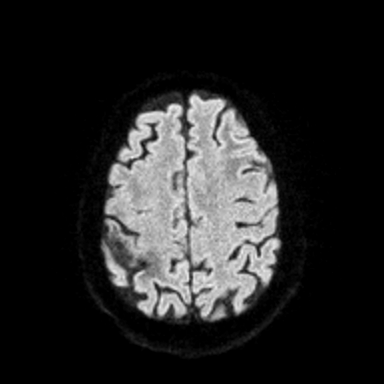
[im 48/48]
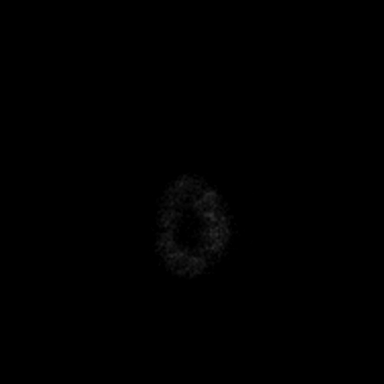

[Series 6: ax dwi_adc · axial · 3.0mm · 0.60mm/px · z∈[-99,+53]mm · 4 of 48 slices shown]
[im 1/48]
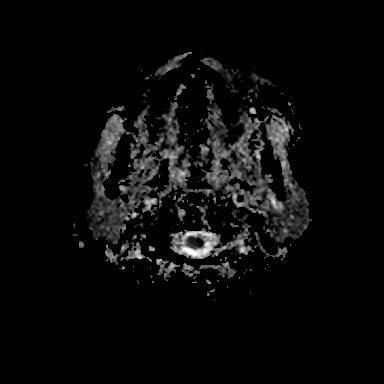
[im 16/48]
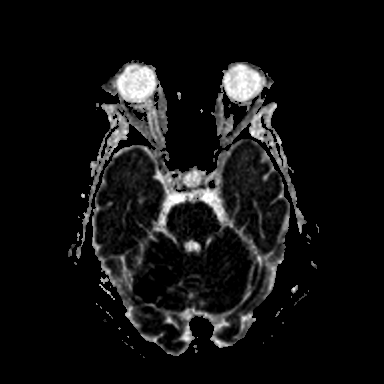
[im 32/48]
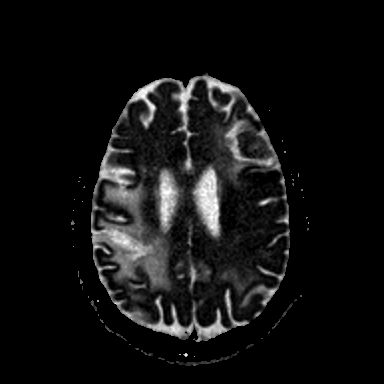
[im 48/48]
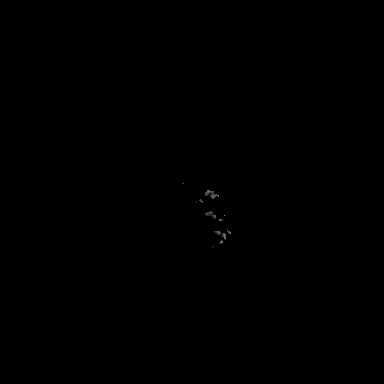

[Series 7: cor dwi_tracew · coronal · 5.0mm · 0.60mm/px · 3 of 40 slices shown]
[im 1/40]
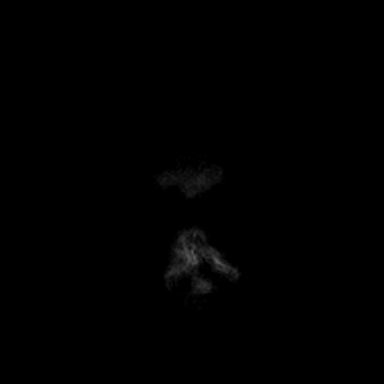
[im 20/40]
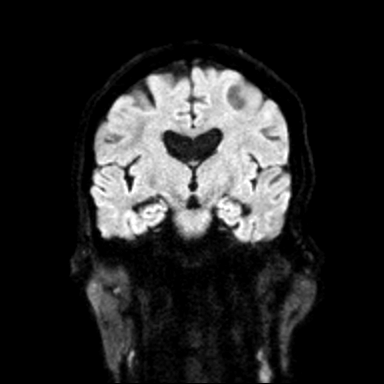
[im 40/40]
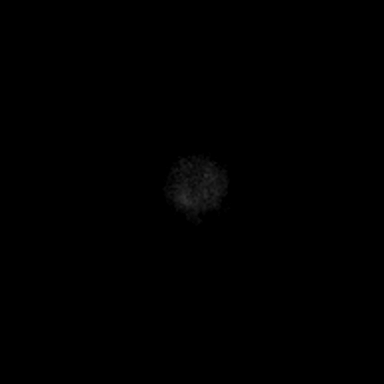

[Series 8: cor dwi_adc · coronal · 5.0mm · 0.60mm/px · 3 of 39 slices shown]
[im 1/39]
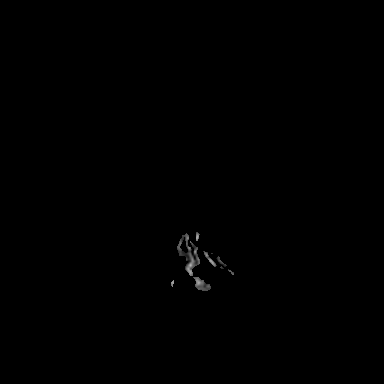
[im 20/39]
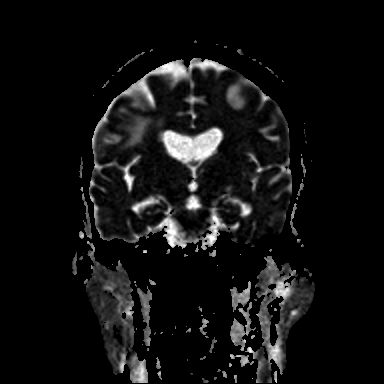
[im 39/39]
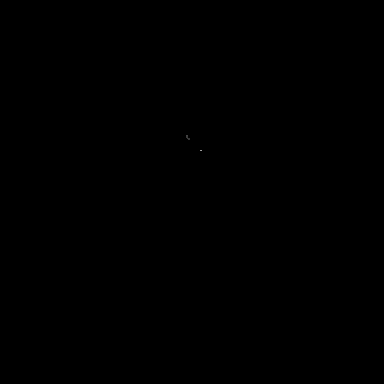

[Series 9: T1 · sagittal · 5.0mm · 0.62mm/px · 2 of 25 slices shown (1 of 2)]
[im 1/25]
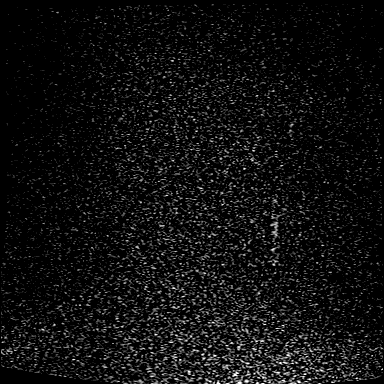
[im 25/25]
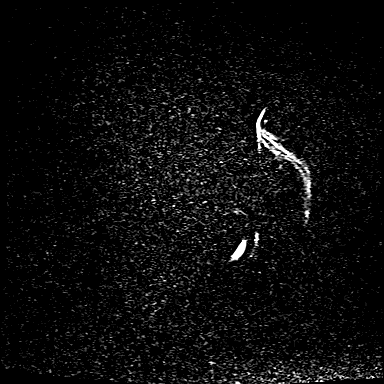

[Series 10: T2 · axial · 5.0mm · 0.53mm/px · z∈[-97,+49]mm · 2 of 26 slices shown (1 of 2)]
[im 1/26]
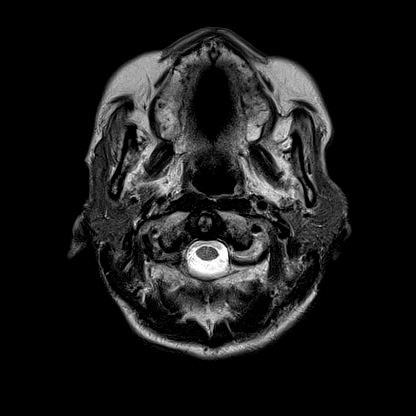
[im 26/26]
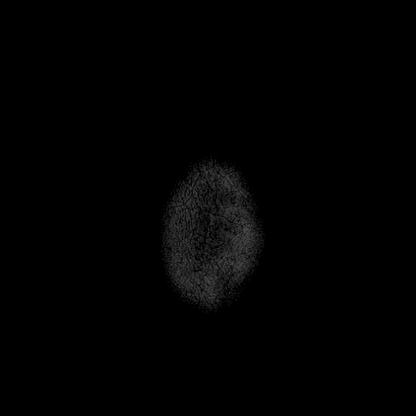

[Series 12: pha_images · axial · 3.0mm · 0.90mm/px · z∈[-109,+49]mm · 4 of 55 slices shown]
[im 1/55]
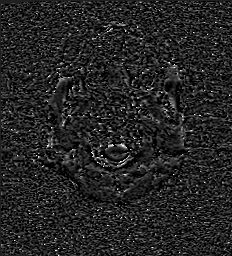
[im 19/55]
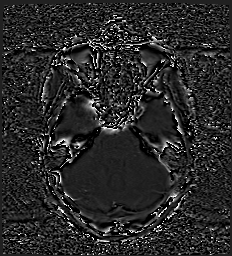
[im 37/55]
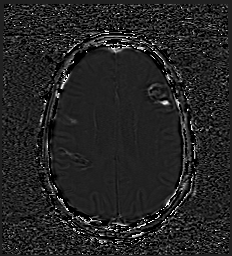
[im 55/55]
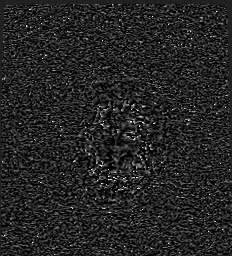

[Series 13: swi_images · axial · 3.0mm · 0.90mm/px · z∈[-112,+61]mm · 5 of 60 slices shown]
[im 1/60]
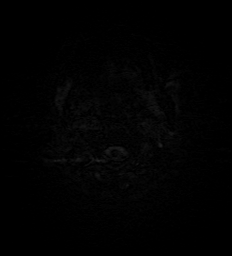
[im 15/60]
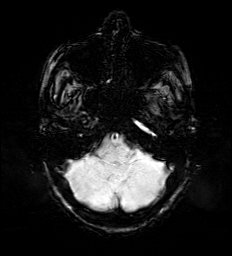
[im 30/60]
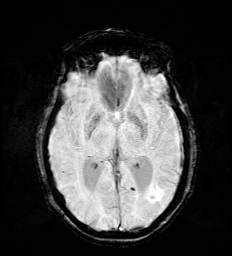
[im 45/60]
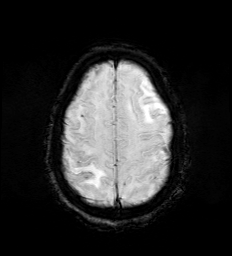
[im 60/60]
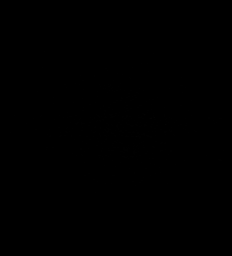

[Series 15: FLAIR · axial · 3.0mm · 0.53mm/px · z∈[-103,+55]mm · 4 of 55 slices shown]
[im 1/55]
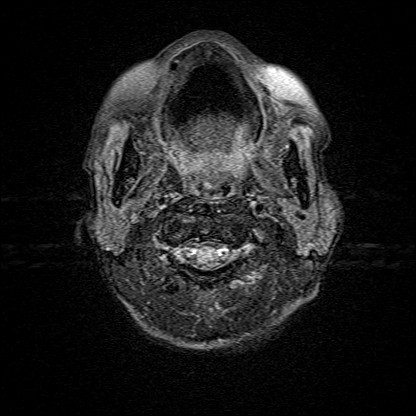
[im 19/55]
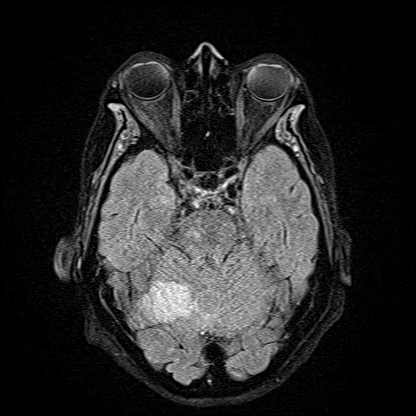
[im 37/55]
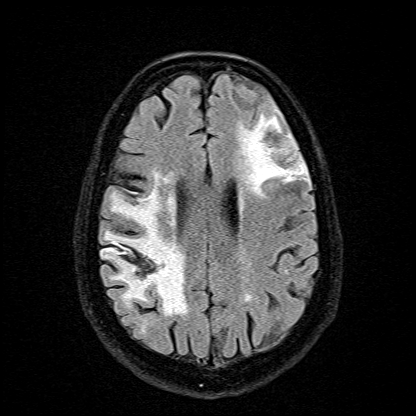
[im 55/55]
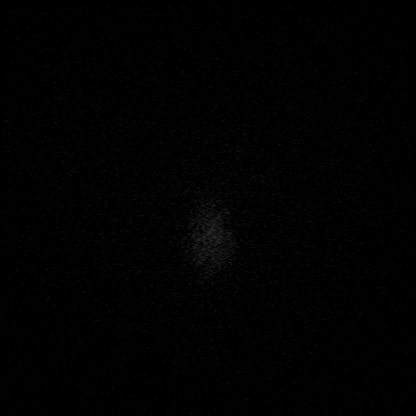

[Series 16: T1 · axial · 1.0mm · 0.98mm/px · z∈[-110,+61]mm · 9 of 176 slices shown (2 of 2)]
[im 1/176]
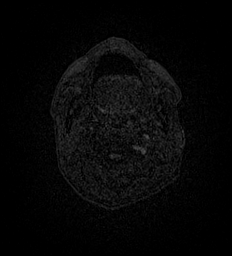
[im 14/176]
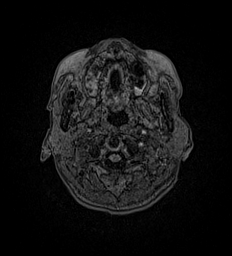
[im 27/176]
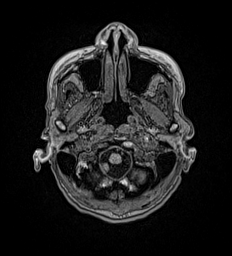
[im 54/176]
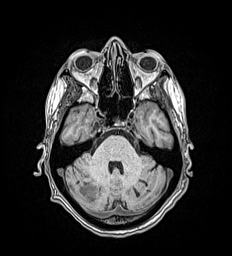
[im 81/176]
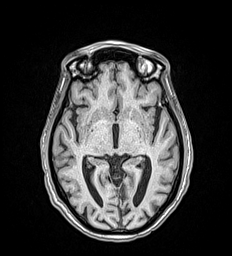
[im 95/176]
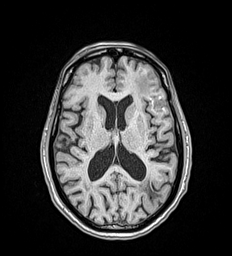
[im 122/176]
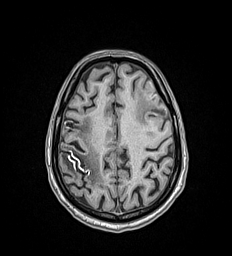
[im 149/176]
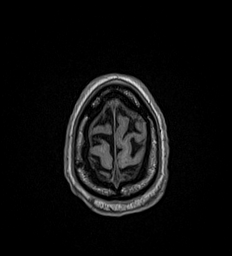
[im 176/176]
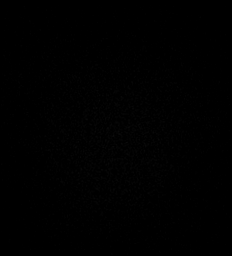

[Series 17: T2 · coronal · 5.0mm · 0.57mm/px · 2 of 30 slices shown (2 of 2)]
[im 1/30]
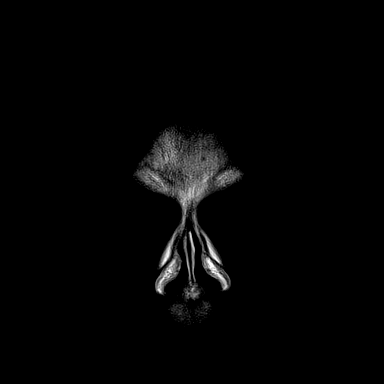
[im 30/30]
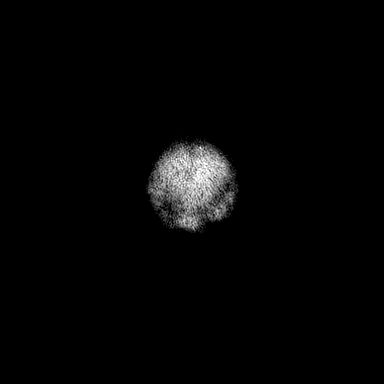

[43 of 48 positions shown; findings below may reference images not displayed]

FINDINGS: Brain: There is an intermediate sized acute infarct within the right
cerebellum. No other acute ischemia. There are old infarcts of the
left occipital lobe, both frontal lobes and both parietal lobes.
Multifocal white matter hyperintensity, most commonly due to chronic
ischemic microangiopathy. There is generalized atrophy without lobar
predilection. There are multiple old cerebellar small vessel
infarcts. There is hemosiderin deposition at the sites of the old
infarcts.

Vascular: Normal flow voids.

Skull and upper cervical spine: Normal marrow signal.

Sinuses/Orbits: Negative.

Other: None.
IMPRESSION: 1. Intermediate sized acute infarct of the right cerebellum. No
acute hemorrhage or mass effect.
2. Multiple old infarcts of the cerebellum, left occipital lobe,
both frontal lobes and both parietal lobes.
3. Chronic ischemic microangiopathy.

## 2020-08-07 IMAGING — CT CT HEAD W/O CM
3 series · 15 of 46 positions shown, 18 images · non-contrast
Comparison: [DATE]

CLINICAL DATA: Altered mental status.

EXAM:
CT HEAD WITHOUT CONTRAST
TECHNIQUE: Contiguous axial images were obtained from the base of the skull
through the vertex without intravenous contrast.

[Series 2: head wo · axial · 0.42mm/px · z∈[+337,+457]mm · 9 of 29 slices shown, 12 images]
[im 3/29  brain]
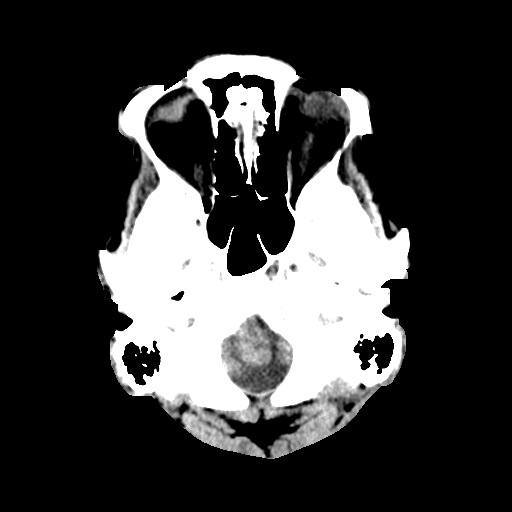
[im 3/29  bone]
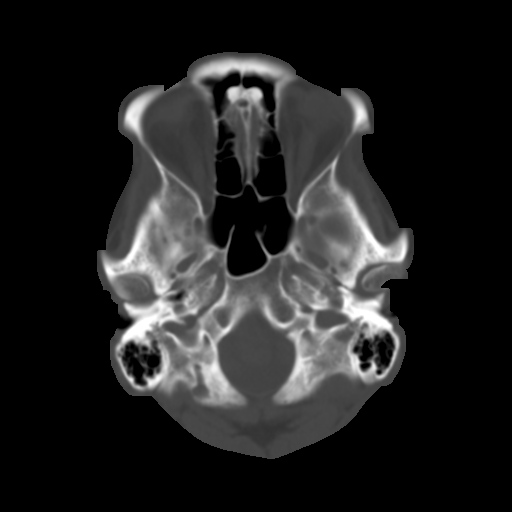
[im 6/29  brain]
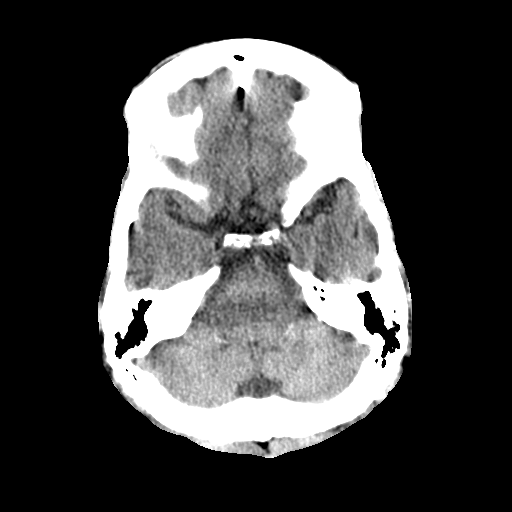
[im 9/29  brain]
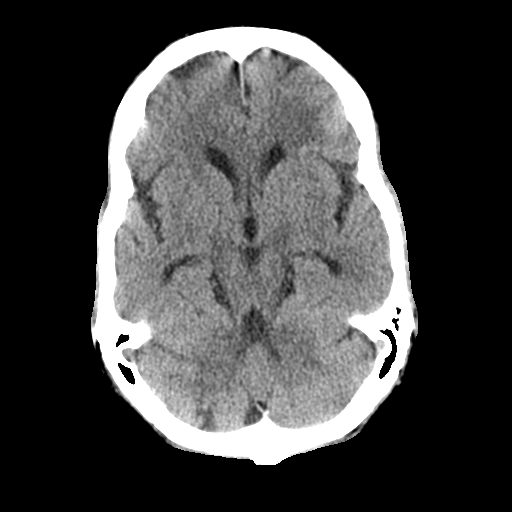
[im 12/29  brain]
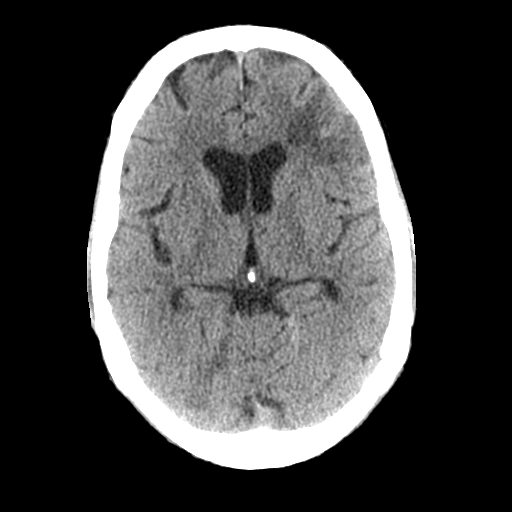
[im 15/29  brain]
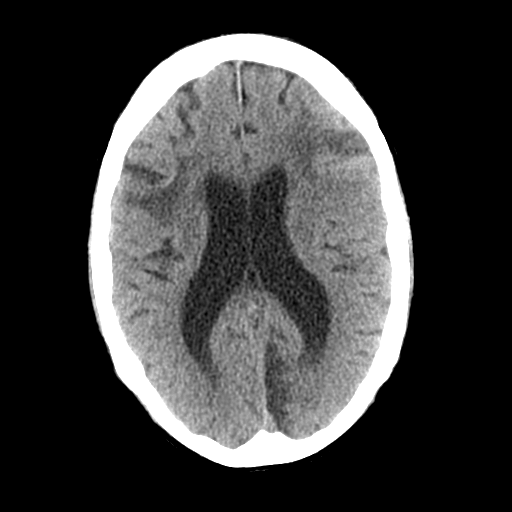
[im 15/29  bone]
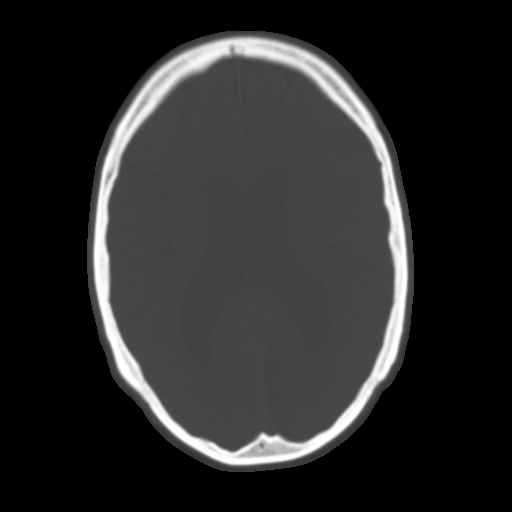
[im 18/29  brain]
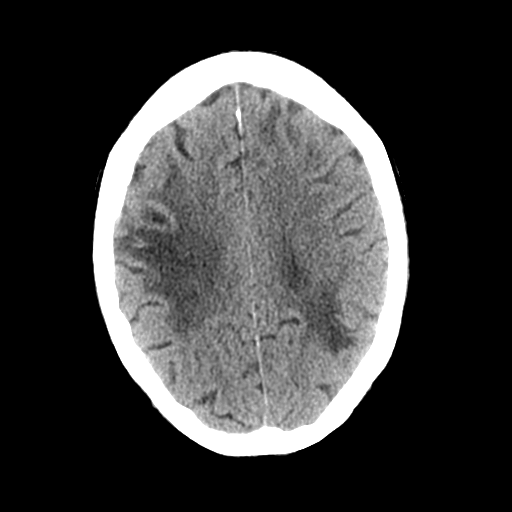
[im 21/29  brain]
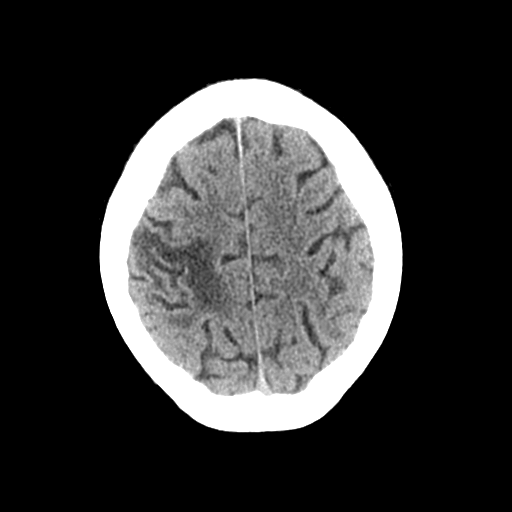
[im 24/29  brain]
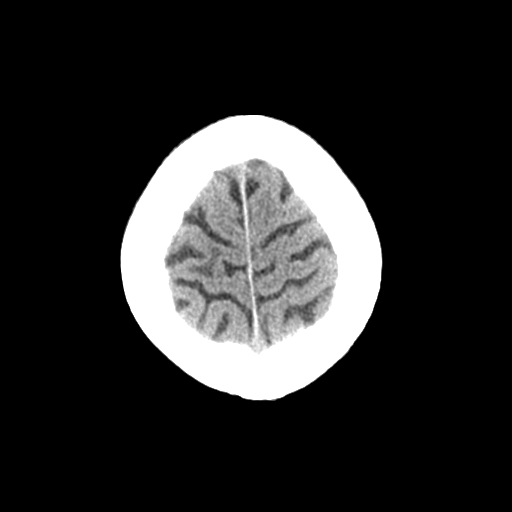
[im 27/29  brain]
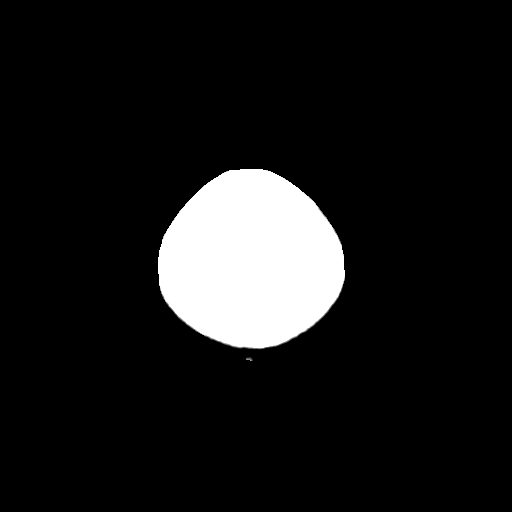
[im 27/29  bone]
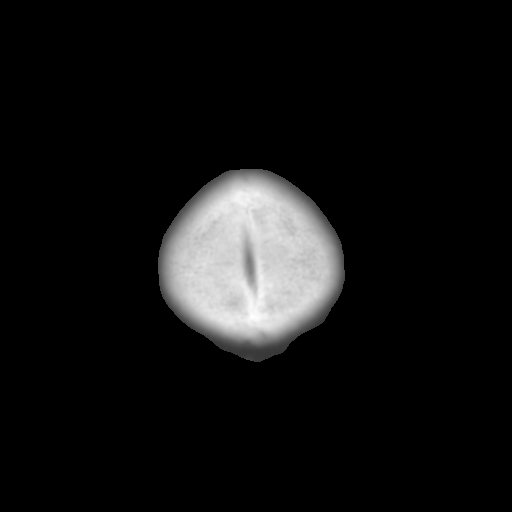

[Series 4: coronal soft tissue · coronal · 0.31mm/px · 3 of 65 slices shown]
[im 22/65  brain]
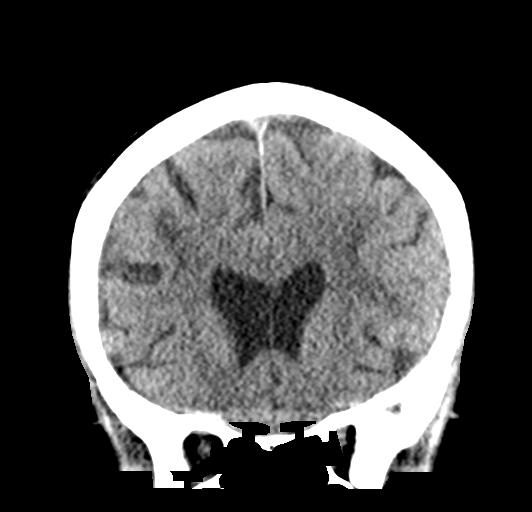
[im 29/65  brain]
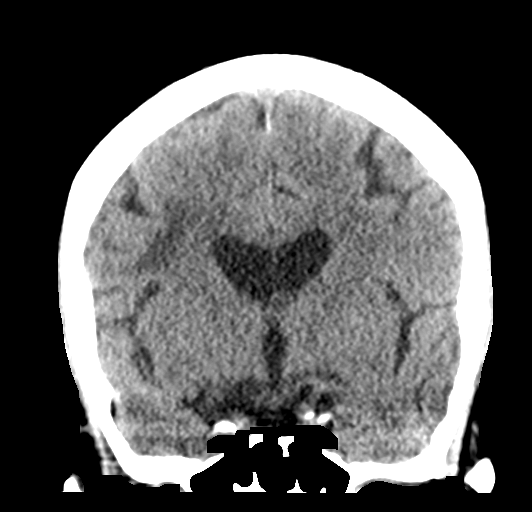
[im 36/65  brain]
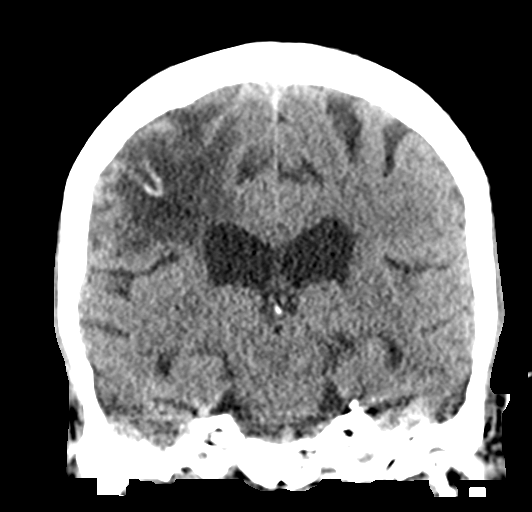

[Series 5: sagittal soft tissue · sagittal · 0.31mm/px · 3 of 50 slices shown]
[im 17/50  brain]
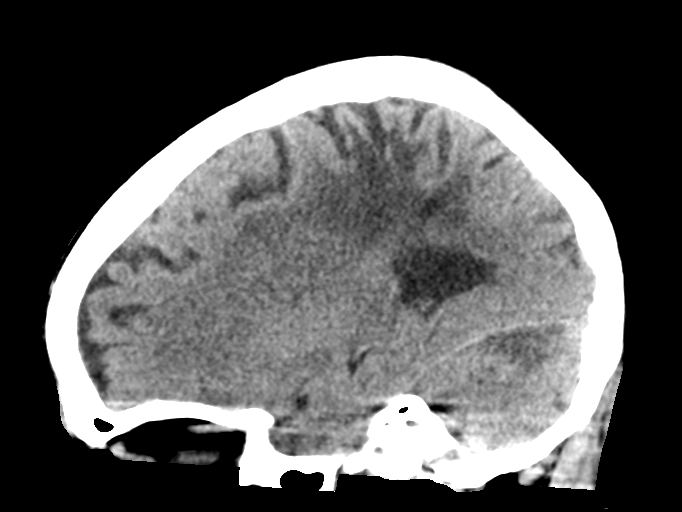
[im 25/50  brain]
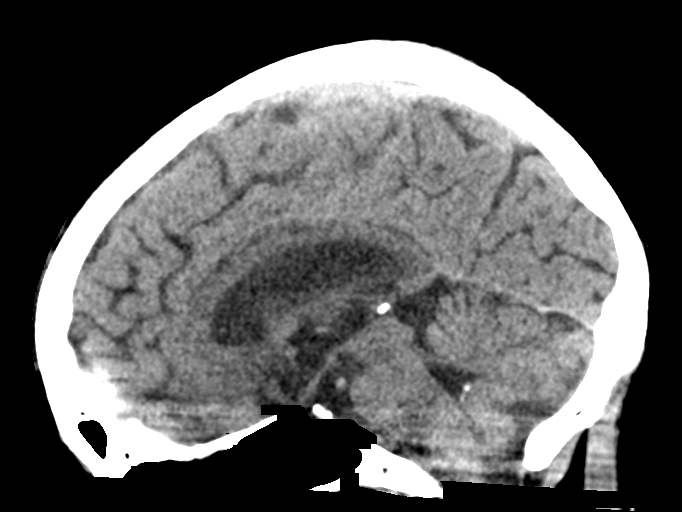
[im 33/50  brain]
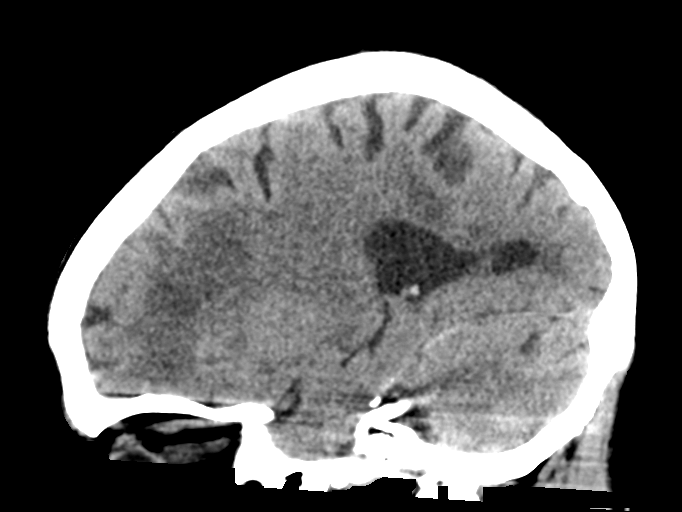

[15 of 46 positions shown; findings below may reference images not displayed]

FINDINGS: Brain: There is mild cerebral atrophy with widening of the
extra-axial spaces and ventricular dilatation.
There are areas of decreased attenuation within the white matter
tracts of the supratentorial brain, consistent with microvascular
disease changes.

A 2.8 cm x 1.4 cm area of white matter low attenuation is seen
within the cerebellum on the right. This represents a new finding
when compared to the prior study. Mass effect is seen on the
adjacent sulci. No midline shift is noted.

Stable areas of cortical encephalomalacia, with adjacent chronic
white matter low attenuation are seen within the left frontal, left
occipital and bilateral parietal regions, right greater than left.
Very mildly increased cortical attenuation is seen within the left
frontal region.

Vascular: No hyperdense vessel or unexpected calcification.

Skull: Normal. Negative for fracture or focal lesion.

Sinuses/Orbits: No acute finding.

Other: None.
IMPRESSION: 1. 2.8 cm x 1.4 cm area of white matter low attenuation within the
cerebellum on the right, with associated mass effect on the adjacent
sulci. This represents a new finding when compared to the prior
study dated [DATE] and is consistent with an acute to
subacute infarct. MRI correlation is recommended.
2. Very mildly increased cortical attenuation is seen within the
left frontal region. A small amount of acute blood cannot completely
be excluded. MRI correlation is recommended.
3. Stable areas of cortical encephalomalacia, with adjacent chronic
white matter low attenuation are seen within the left frontal, left
occipital and bilateral parietal regions, right greater than left.

## 2020-08-07 IMAGING — DX DG CHEST 1V PORT
1 series · 1 of 1 positions shown · non-contrast
Comparison: [DATE].

CLINICAL DATA: Recent CVA.

EXAM:
PORTABLE CHEST 1 VIEW

[chest ap]
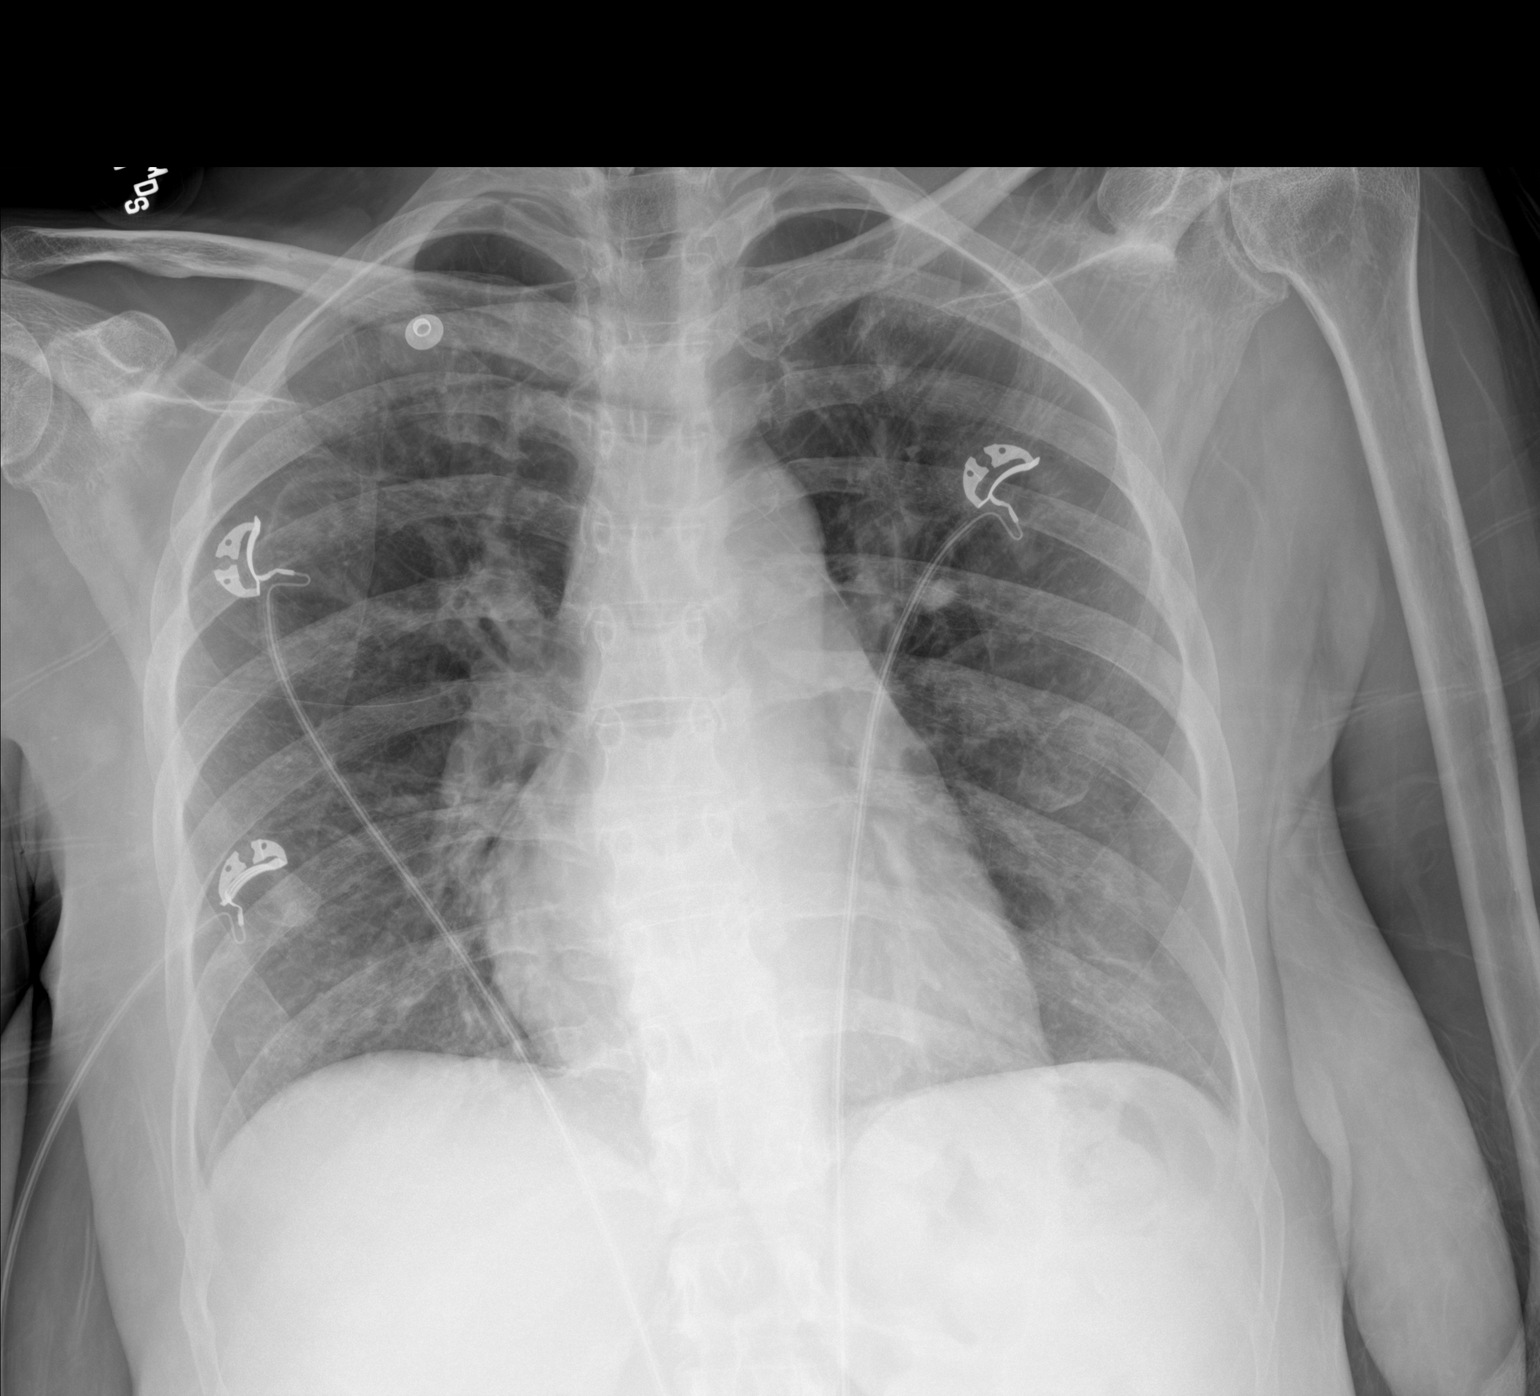

[1 of 1 positions shown; findings below may reference images not displayed]

FINDINGS: Mediastinum and hilar structures normal. Heart size normal. No focal
infiltrate. No pleural effusion or pneumothorax. Mild thoracic spine
scoliosis concave left.
IMPRESSION: No acute cardiopulmonary disease.

## 2020-08-07 MED ORDER — INSULIN REGULAR(HUMAN) IN NACL 100-0.9 UT/100ML-% IV SOLN
INTRAVENOUS | Status: DC
  Administered 2020-08-07: 8.5 [IU]/h via INTRAVENOUS

## 2020-08-07 MED ORDER — LORAZEPAM 2 MG/ML IJ SOLN: 1.0000 mg | INTRAMUSCULAR | Status: DC | PRN

## 2020-08-07 MED ORDER — ASPIRIN EC 81 MG PO TBEC: 81.0000 mg | DELAYED_RELEASE_TABLET | Freq: Every day | ORAL | Status: DC

## 2020-08-07 MED ORDER — INSULIN ASPART 100 UNIT/ML ~~LOC~~ SOLN
0.0000 [IU] | SUBCUTANEOUS | Status: DC
  Administered 2020-08-08: 2 [IU] via SUBCUTANEOUS
  Administered 2020-08-08: 3 [IU] via SUBCUTANEOUS
  Administered 2020-08-08: 9 [IU] via SUBCUTANEOUS
  Administered 2020-08-08 (×2): 1 [IU] via SUBCUTANEOUS
  Administered 2020-08-08: 23:00:00 9 [IU] via SUBCUTANEOUS
  Administered 2020-08-09: 05:00:00 2 [IU] via SUBCUTANEOUS
  Administered 2020-08-09 (×2): 3 [IU] via SUBCUTANEOUS
  Filled 2020-08-07 (×9): qty 1

## 2020-08-07 MED ORDER — MAGNESIUM SULFATE 2 GM/50ML IV SOLN
2.0000 g | Freq: Once | INTRAVENOUS | Status: AC
  Administered 2020-08-07: 2 g via INTRAVENOUS
  Filled 2020-08-07: qty 50

## 2020-08-07 MED ORDER — HYDRALAZINE HCL 20 MG/ML IJ SOLN
5.0000 mg | INTRAMUSCULAR | Status: DC | PRN
  Filled 2020-08-07: qty 1

## 2020-08-07 MED ORDER — LACTATED RINGERS IV SOLN: INTRAVENOUS | Status: DC

## 2020-08-07 MED ORDER — DEXTROSE IN LACTATED RINGERS 5 % IV SOLN: INTRAVENOUS | Status: DC

## 2020-08-07 MED ORDER — DEXTROSE 50 % IV SOLN: 0.0000 mL | INTRAVENOUS | Status: DC | PRN

## 2020-08-07 MED ORDER — LEVETIRACETAM 500 MG PO TABS
500.0000 mg | ORAL_TABLET | Freq: Two times a day (BID) | ORAL | Status: DC
  Administered 2020-08-07 – 2020-08-09 (×5): 500 mg via ORAL
  Filled 2020-08-07 (×8): qty 1

## 2020-08-07 MED ORDER — POTASSIUM CHLORIDE 10 MEQ/100ML IV SOLN
10.0000 meq | INTRAVENOUS | Status: AC
  Administered 2020-08-07: 10 meq via INTRAVENOUS
  Filled 2020-08-07: qty 100

## 2020-08-07 MED ORDER — INSULIN REGULAR(HUMAN) IN NACL 100-0.9 UT/100ML-% IV SOLN
INTRAVENOUS | Status: DC
  Filled 2020-08-07: qty 100

## 2020-08-07 MED ORDER — ONDANSETRON HCL 4 MG/2ML IJ SOLN: 4.0000 mg | Freq: Three times a day (TID) | INTRAMUSCULAR | Status: DC | PRN

## 2020-08-07 MED ORDER — SODIUM CHLORIDE 0.9 % IV BOLUS
1000.0000 mL | Freq: Once | INTRAVENOUS | Status: AC
  Administered 2020-08-07: 1000 mL via INTRAVENOUS

## 2020-08-07 MED ORDER — LACTATED RINGERS IV BOLUS
1000.0000 mL | Freq: Once | INTRAVENOUS | Status: AC
  Administered 2020-08-07: 1000 mL via INTRAVENOUS

## 2020-08-07 NOTE — Progress Notes (Signed)
Inpatient Diabetes Program Recommendations  AACE/ADA: New Consensus Statement on Inpatient Glycemic Control (2015)  Target Ranges:  Prepandial:   less than 140 mg/dL      Peak postprandial:   less than 180 mg/dL (1-2 hours)      Critically ill patients:  140 - 180 mg/dL   Lab Results  Component Value Date   GLUCAP >600 ([place]) 08/07/2020   HGBA1C 9.5 ([facility]) 07/24/2020    Review of Glycemic Control Results for Susan Navarro, Susan Navarro (MRN [id number]) as of 08/07/2020 14:45  Ref. Range 08/07/2020 11:49  Glucose-Capillary Latest Ref Range: 70 - 99 mg/dL >600 ([facility])   Diabetes history: DM 1 Outpatient Diabetes medications:  Lantus 7 units bid, Humalog 2 units tid with meals Current orders for Inpatient glycemic control:  IV insulin Inpatient Diabetes Program Recommendations:    Referral received.  Note that patient has had past admissions when she has run out of insulin?  Will follow.  Patient is very sensitive to insulin as well.  Consider restarting home DM medications once she meets criteria for transition off insulin drip.    Thanks, Adah Perl, RN, BC-ADM Inpatient Diabetes Coordinator Pager [phone] (8a-5p)

## 2020-08-07 NOTE — ED Triage Notes (Addendum)
Pt arrives to ER via EMS. Pt denies pain. Pt states she doesn't know why she's here. Pt states she lives by self but didn't call EMS. States doesn't know who called EMS. States not feeling well this morning. Denies N&V&D. Only c/o in triage is having to urinate. Pt seems confused to this RN and smells of urine. Unable to catch urine sample in triage d/t pt forgetting to urinate into specimen cup.

## 2020-08-07 NOTE — ED Notes (Signed)
cbg [id number], charge RN notified.

## 2020-08-07 NOTE — ED Triage Notes (Signed)
First nurse note- called EMS for not feeling well. Recent CVA.  Blood sugar critical high with EMS.  Has been drinking lot of soda.

## 2020-08-07 NOTE — Consult Note (Signed)
[facility] Tele[facility]urology Consult Services   Date of Service:   08/07/2020 22:31:24  Impression:     .  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 She is not a candidate for thrombolytic therapy because of unknown time of onset and early appearance of acute stroke. This is not consistent with large vessel occlusion so she is not a candidate for thrombectomy. She needs conservative management. EKG monitoring, echocardiogram, blood glucose and blood pressure control.  Metrics: Last Known Well: Unknown [facility] Notification Time: 08/07/2020 22:30:47 Stamp Time: 08/07/2020 22:31:24 Time First Login Attempt: 08/07/2020 22:36:00 Symptoms: poorly responsive. NIHSS Start Assessment Time: 08/07/2020 22:17:00 Patient is not a candidate for Thrombolytic. Thrombolytic Medical Decision: 08/07/2020 22:55:56 Patient was not deemed candidate for Thrombolytic because of following reasons: Last Well Known Above 4.5 Hours.  CT head was reviewed and results were: There are all old substantial infarctions in the right cerebellum, left frontal lobe, right parietal-temporal region, left parietal lobe, right pons and there is an acute infarction in the territory of the right superior cerebellar artery.  Radiologist was not called back for review of advanced imaging.  Advanced Imaging: Advanced Imaging Not Recommended because:  Completed stroke     ------------------------------------------------------------------------------  History of Present Illness: Patient is a 59 year old Female.  Inpatient stroke alert was called for symptoms of poorly responsive.  This 60 year old woman came in with altered mental status and initial blood sugar of 1196 felt to be a non-ketotic hyperglycemic coma. Her blood sugar has come down  considerably with treatment. As she was poorly responsive a CT scan the brain was performed and then an MRI. She has multiple old infarction acute infarct in the right cerebellum. Stroke alert was called because of the acute infarct. She apparently has had some type of brain bleed in the past.  Last seen normal was beyond 4.5 hours of presentation. There is history of hemorrhagic complications or intracranial hemorrhage. There is no history of Recent Anticoagulants. There is no history of recent major surgery. There is no history of recent stroke.  Past Medical History:     . Hypertension     . Diabetes Mellitus     . Hyperlipidemia     . Stroke     . There is NO history of Atrial Fibrillation     . There is NO history of Coronary Artery Disease     . Chronic kidney disease medication list suggest she's had seizures (Keppra)  Social History:Unable to obtain due to Patient Status  Family History:Unable to obtain due to Patient Status  Review of System:  14 Points Review of Systems was performed and was negative except mentioned in HPI.  Anticoagulant use:  No  Antiplatelet use: No  NIHSS may not be reliable due to: Patient is to lethargic to respond. According to the nursing staff they are able to wake her up briefly and she will answer simple questions and follow simple commands. She is weak all over but able to move all extremities. There is only a question of facial droop. She responded to tactile stimulation bilaterally.  Examination: BP(140/79), Pulse(78), Blood Glucose(1196 then most recently 156) 1A: Level of Consciousness - Alert; keenly responsive + 0 1B: Ask Month and Age - Both Questions Right + 0 1C: Blink Eyes & Squeeze Hands - Performs Both Tasks + 0 2: Test Horizontal Extraocular Movements - Normal + 0  3: Test Visual Fields - No Visual Loss + 0 4: Test Facial Palsy (Use Grimace if Obtunded) - Normal symmetry + 0 5A: Test Left Arm Motor Drift - No Drift for 10  Seconds + 0 5B: Test Right Arm Motor Drift - No Drift for 10 Seconds + 0 6A: Test Left Leg Motor Drift - No Drift for 5 Seconds + 0 6B: Test Right Leg Motor Drift - No Drift for 5 Seconds + 0 7: Test Limb Ataxia (FNF/Heel-Shin) - No Ataxia + 0 8: Test Sensation - Normal; No sensory loss + 0 9: Test Language/Aphasia - Normal; No aphasia + 0 10: Test Dysarthria - Normal + 0 11: Test Extinction/Inattention - No abnormality + 0  NIHSS Score: 0  Pre-Morbid Modified Rankin Scale: Unable to assess   Patient/Family was informed the Neurology Consult would occur via TeleHealth consult by way of interactive audio and video telecommunications and consented to receiving care in this manner.   Patient is being evaluated for possible acute neurologic impairment and high probability of imminent or life-threatening deterioration. I spent total of 40 minutes providing care to this patient, including time for face to face visit via telemedicine, review of medical records, imaging studies and discussion of findings with providers, the patient and/or family.   Dr Cindie Laroche   [facility] [phone]  Case [id number]

## 2020-08-07 NOTE — ED Provider Notes (Signed)
[facility] Emergency Department Provider Note  ____________________________________________   First MD Initiated Contact with Patient 08/07/20 1236     (approximate)  I have reviewed the triage vital signs and the nursing notes.   HISTORY  Chief Complaint Hyperglycemia and Altered Mental Status   HPI Susan Navarro is a 59 y.o. female with a past medical history of anemia, CKD, DM, HTN, and CVA who presents for assessment via EMS from home for assessment of confusion.  Patient states she is not sure why she is emergency room.  She denies any falls or injuries but does endorse some nonbloody nonbilious vomiting.  Denies any cough, chest pain, dental pain, diarrhea, rash or other acute complaints.         Past Medical History:  Diagnosis Date  . Anemia   . Chronic kidney disease   . Diabetes mellitus without complication ([place])   . Hypertension   . Stroke [facility]) 03/2018   Ischemic stroke per [facility] records    Patient Active Problem List   Diagnosis Date Noted  . Hyperosmolar hyperglycemic state (HHS) ([place]) 08/07/2020  . Hypomagnesemia 08/07/2020  . ICH (intracerebral hemorrhage) ([place]) 07/24/2020  . SAH (subarachnoid hemorrhage) ([place]) 07/24/2020  . Fall 06/27/2020  . HTN (hypertension) 02/16/2020  . Acute renal failure superimposed on stage 4 chronic kidney disease ([place]) 12/30/2019  . Other cirrhosis of liver ([place]) 12/30/2019  . Thrombocytopenia ([place]) 12/30/2019  . 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 Transaminitis 12/30/2019  . Malnutrition of moderate degree 12/28/2019  . Anemia of chronic disease   . Generalized weakness   . Acute metabolic encephalopathy   . DKA (diabetic ketoacidoses) ([place]) 12/25/2019  . Hyponatremia   . Hyperkalemia   . Stroke [facility]) 03/2018    History reviewed. No pertinent surgical history.  Prior to Admission medications   Medication Sig Start Date End Date Taking?  Authorizing Provider  amLODipine (NORVASC) 10 MG tablet Take 1 tablet (10 mg total) by mouth daily. 07/30/20 08/29/20  Para Skeans, MD  atorvastatin (LIPITOR) 80 MG tablet Take 80 mg by mouth daily. 05/24/20   [provider]  blood glucose meter kit and supplies KIT Dispense based on patient and insurance preference. Use up to four times daily as directed. (FOR ICD-9 250.00, 250.01). 07/29/20   Para Skeans, MD  Ensure Max Protein (ENSURE MAX PROTEIN) LIQD Take 330 mLs (11 oz total) by mouth 2 (two) times daily between meals. 12/30/19   Dhungel, Nishant, MD  insulin glargine (LANTUS) 100 UNIT/ML injection Inject 0.07 mLs (7 Units total) into the skin 2 (two) times daily. 07/29/20 08/28/20  Para Skeans, MD  insulin lispro (HUMALOG KWIKPEN) 100 UNIT/ML KwikPen Inject 0.02 mLs (2 Units total) into the skin 3 (three) times daily before meals. 07/29/20 08/28/20  Para Skeans, MD  Insulin Syringe-Needle U-100 (HEALTHWISE INSULIN SYR/NEEDLE) 30G X 5/16" 0.5 ML MISC 1 each by Does not apply route 3 (three) times daily with meals. 02/17/20   Ezekiel Slocumb, DO  levETIRAcetam (KEPPRA) 500 MG tablet Take 1 tablet (500 mg total) by mouth 2 (two) times daily. 07/29/20 08/28/20  Para Skeans, MD  lidocaine (LIDODERM) 5 % Place 1 patch onto the skin daily. Remove & Discard patch within 12 hours or as directed by MD 06/29/20   Nolberto Hanlon, MD  Multiple Vitamin (MULTIVITAMIN WITH MINERALS) TABS tablet Take 1 tablet by mouth daily. 06/30/20   Nolberto Hanlon, MD  propranolol (INDERAL) 10 MG tablet Take 1 tablet (10 mg total) by mouth 3 (three) times daily. 07/29/20 08/28/20  Para Skeans, MD    Allergies Patient has no known allergies.  Family History  Problem Relation Age of Onset  . Diabetes Mother   . Diabetes Brother     Social History Social History   Tobacco Use  . Smoking status: Never Smoker  . Smokeless tobacco: Never Used  Vaping Use  . Vaping Use: Never used  Substance Use Topics  . Alcohol use: No  .  Drug use: Never    Review of Systems  Review of Systems  Unable to perform ROS: Mental status change      ____________________________________________   PHYSICAL EXAM:  VITAL SIGNS: ED Triage Vitals  Enc Vitals Group     BP 08/07/20 1149 138/66     Pulse Rate 08/07/20 1149 96     Resp 08/07/20 1149 16     Temp 08/07/20 1149 98.5 F (36.9 C)     Temp Source 08/07/20 1149 Oral     SpO2 08/07/20 1149 100 %     Weight 08/07/20 1146 149 lb 7.6 oz (67.8 kg)     Height 08/07/20 1146 _0  (1.676 m)     Head Circumference --      Peak Flow --      Pain Score 08/07/20 1149 0     Pain Loc --      Pain Edu? --      Excl. in [place]? --    Vitals:   08/07/20 1149  BP: 138/66  Pulse: 96  Resp: 16  Temp: 98.5 F (36.9 C)  SpO2: 100%   Physical Exam Vitals and nursing note reviewed.  Constitutional:      General: She is not in acute distress.    Appearance: She is well-developed.  HENT:     Head: Normocephalic and atraumatic.     Right Ear: External ear normal.     Left Ear: External ear normal.     Nose: Nose normal.     Mouth/Throat:     Mouth: Mucous membranes are moist.  Eyes:     Conjunctiva/sclera: Conjunctivae normal.  Cardiovascular:     Rate and Rhythm: Normal rate and regular rhythm.     Heart sounds: No murmur heard.   Pulmonary:     Effort: Pulmonary effort is normal. No respiratory distress.     Breath sounds: Normal breath sounds.  Abdominal:     Palpations: Abdomen is soft.     Tenderness: There is no abdominal tenderness.  Musculoskeletal:     Cervical back: Neck supple.  Skin:    General: Skin is warm and dry.  Neurological:     Mental Status: She is alert. She is disoriented and confused.      ____________________________________________   LABS (all labs ordered are listed, but only abnormal results are displayed)  Labs Reviewed  BASIC METABOLIC PANEL - Abnormal; Notable for the following components:      Result Value   Sodium 122 (*)     Chloride 87 (*)    CO2 20 (*)    Glucose, Bld 1,166 (*)    BUN 59 (*)    Creatinine, Ser 1.93 (*)    GFR calc non Af Amer 28 (*)    GFR calc Af Amer 32 (*)    All other components within normal limits  CBC - Abnormal; Notable for the following components:  RBC 3.08 (*)    Hemoglobin 9.7 (*)    HCT 29.5 (*)    Platelets 95 (*)    All other components within normal limits  URINALYSIS, COMPLETE (UACMP) WITH MICROSCOPIC - Abnormal; Notable for the following components:   Color, Urine STRAW (*)    APPearance CLEAR (*)    Glucose, UA >=500 (*)    All other components within normal limits  BLOOD GAS, VENOUS - Abnormal; Notable for the following components:   pCO2, Ven 42 (*)    Acid-base deficit 3.0 (*)    All other components within normal limits  GLUCOSE, CAPILLARY - Abnormal; Notable for the following components:   Glucose-Capillary >600 (*)    All other components within normal limits  MAGNESIUM - Abnormal; Notable for the following components:   Magnesium 1.6 (*)    All other components within normal limits  HEPATIC FUNCTION PANEL - Abnormal; Notable for the following components:   AST 92 (*)    ALT 99 (*)    Alkaline Phosphatase 623 (*)    Total Bilirubin 1.4 (*)    Bilirubin, Direct 0.4 (*)    Indirect Bilirubin 1.0 (*)    All other components within normal limits  SARS CORONAVIRUS 2 BY RT PCR (HOSPITAL ORDER, [place] [facility])  TSH  BETA-HYDROXYBUTYRIC ACID  OSMOLALITY  AMMONIA  BASIC METABOLIC PANEL  BRAIN NATRIURETIC PEPTIDE  BASIC METABOLIC PANEL  BASIC METABOLIC PANEL  BASIC METABOLIC PANEL  BASIC METABOLIC PANEL  CBG MONITORING, ED  CBG MONITORING, ED  CBG MONITORING, ED  TROPONIN I (HIGH SENSITIVITY)   ____________________________________________  EKG  Sinus rhythm with a ventricular rate of 96, prolonged QTc interval at 538, diffuse nonspecific T wave changes and otherwise no clear evidence of acute ischemia or other  significant underlying arrhythmia. ____________________________________________  RADIOLOGY   Official radiology report(s): No results found.  ____________________________________________   PROCEDURES  Procedure(s) performed (including Critical Care):  .1-3 Lead EKG Interpretation Performed by: Lucrezia Starch, MD Authorized by: Lucrezia Starch, MD     ECG rate assessment: normal     Rhythm: sinus rhythm     Ectopy: none     Conduction: normal       ____________________________________________   INITIAL IMPRESSION / ASSESSMENT AND PLAN / ED COURSE        Overall patient's history, exam, ED work-up is concerning for HHS and severe dehydration likely causing patient's encephalopathy.  Patient has no acidosis to suggest DKA.  Hyponatremia likely pseudohyponatremia in the setting of elevated glucose.  Patient is noted to have AKI on CKD as well as mild hypochloremia.  No obvious source of infection on exam patient denies any toxic ingestion or recent trauma.  Presentation and work-up is not consistent with meningitis.  EKG does not appear consistent with ACS although will plan to obtain troponins as well.  TSH WNL.  Patient started on below noted medications.  We will plan to admit to medicine service for further evaluation management.  Medications  lactated ringers bolus 1,000 mL (has no administration in time range)  lactated ringers infusion (has no administration in time range)  insulin regular, human (MYXREDLIN) 100 units/ 100 mL infusion (has no administration in time range)  dextrose 5 % in lactated ringers infusion (has no administration in time range)  dextrose 50 % solution 0-50 mL (has no administration in time range)  potassium chloride 10 mEq in 100 mL IVPB (has no administration in time range)  lactated ringers infusion (has no administration in time range)  magnesium sulfate IVPB 2 g 50 mL (has no administration in time range)  ondansetron (ZOFRAN) injection  4 mg (has no administration in time range)  hydrALAZINE (APRESOLINE) injection 5 mg (has no administration in time range)  levETIRAcetam (KEPPRA) tablet 500 mg (has no administration in time range)  insulin regular, human (MYXREDLIN) 100 units/ 100 mL infusion (has no administration in time range)  dextrose 5 % in lactated ringers infusion (has no administration in time range)  lactated ringers infusion (has no administration in time range)  sodium chloride 0.9 % bolus 1,000 mL (1,000 mLs Intravenous New Bag/Given 08/07/20 1158)     FINAL CLINICAL IMPRESSION(S) / ED DIAGNOSES  Final diagnoses:  Hyperosmolar hyperglycemic state (HHS) ([place])  Hyponatremia  Hypomagnesemia  QT prolongation     ED Discharge Orders    None       Note:  This document was prepared using Dragon voice recognition software and may include unintentional dictation errors.   Lucrezia Starch, MD 08/07/20 [phone]

## 2020-08-07 NOTE — ED Notes (Signed)
Updated pt's daughter Jaleeyah Munce.

## 2020-08-07 NOTE — H&P (Addendum)
History and Physical    Susan Navarro WTU:[id number] DOB: January 26, 1961 DOA: 08/07/2020  Referring MD/NP/PA:   PCP: Rochel Brome, MD   Patient coming from:  The patient is coming from home.  At baseline, pt is independent for most of ADL.        Chief Complaint: Generalized weakness, confusion  HPI: Susan Navarro is a 59 y.o. female with medical history significant of type 1 diabetes, DKA, hypertension, CKD-4, anemia, recent admission due to [facility], liver cirrhosis, thrombocytopenia, who presents with generalized weakness, confusion.  Patient was recently hospitalized from 8/3-8/8 due to subdural hematoma.  Per her daughter (I called her daughter by phone), patient has been drinking a lot of regular soda recently.  She has generalized weakness and confusion.  When I saw patient in ED, patient is confused, knows her own name and is oriented to place, but is confused about time.  No facial droop or slurred speech.  She moves all normally. Patient denies chest pain, shortness breath, cough.  Per her daughter, patient had nausea and vomited once earlier.  Currently patient does not have active nausea vomiting, diarrhea or abdominal pain.  Denies symptoms of UTI.  No fever or chills.  ED Course: pt was found to have blood sugar 1166, anion gap 15, urinalysis negative for UTI and ketone, WBC 5.7, troponin 53, pseudohyponatremia, worsening renal function, ammonia level 30, negative COVID-19 PCR, temperature normal, blood pressure 138/66, heart rates 96, RR 16, oxygen saturation 96% on room air.  Patient is placed on stepdown bed for observation.  Review of Systems: Could not reviewed accurately due to altered mental status.   Allergy: No Known Allergies  Past Medical History:  Diagnosis Date  . Anemia   . Chronic kidney disease   . Diabetes mellitus without complication ([place])   . Hypertension   . Stroke [facility]) 03/2018   Ischemic stroke per [facility] records    History reviewed. No pertinent  surgical history.  Social History:  reports that she has never smoked. She has never used smokeless tobacco. She reports that she does not drink alcohol and does not use drugs.  Family History:  Family History  Problem Relation Age of Onset  . Diabetes Mother   . Diabetes Brother      Prior to Admission medications   Medication Sig Start Date End Date Taking? Authorizing Provider  amLODipine (NORVASC) 10 MG tablet Take 1 tablet (10 mg total) by mouth daily. 07/30/20 08/29/20  Para Skeans, MD  atorvastatin (LIPITOR) 80 MG tablet Take 80 mg by mouth daily. 05/24/20   [provider]  blood glucose meter kit and supplies KIT Dispense based on patient and insurance preference. Use up to four times daily as directed. (FOR ICD-9 250.00, 250.01). 07/29/20   Para Skeans, MD  Ensure Max Protein (ENSURE MAX PROTEIN) LIQD Take 330 mLs (11 oz total) by mouth 2 (two) times daily between meals. 12/30/19   Dhungel, Nishant, MD  insulin glargine (LANTUS) 100 UNIT/ML injection Inject 0.07 mLs (7 Units total) into the skin 2 (two) times daily. 07/29/20 08/28/20  Para Skeans, MD  insulin lispro (HUMALOG KWIKPEN) 100 UNIT/ML KwikPen Inject 0.02 mLs (2 Units total) into the skin 3 (three) times daily before meals. 07/29/20 08/28/20  Para Skeans, MD  Insulin Syringe-Needle U-100 (HEALTHWISE INSULIN SYR/NEEDLE) 30G X 5/16" 0.5 ML MISC 1 each by Does not apply route 3 (three) times daily with meals. 02/17/20   Ezekiel Slocumb, DO  levETIRAcetam (  KEPPRA) 500 MG tablet Take 1 tablet (500 mg total) by mouth 2 (two) times daily. 07/29/20 08/28/20  Para Skeans, MD  lidocaine (LIDODERM) 5 % Place 1 patch onto the skin daily. Remove & Discard patch within 12 hours or as directed by MD 06/29/20   Nolberto Hanlon, MD  Multiple Vitamin (MULTIVITAMIN WITH MINERALS) TABS tablet Take 1 tablet by mouth daily. 06/30/20   Nolberto Hanlon, MD  propranolol (INDERAL) 10 MG tablet Take 1 tablet (10 mg total) by mouth 3 (three) times daily. 07/29/20  08/28/20  Para Skeans, MD    Physical Exam: Vitals:   08/07/20 1800 08/07/20 1830 08/07/20 1900 08/07/20 1924  BP: (!) 148/84 (!) 155/77 (!) 159/95   Pulse: 92 90    Resp: 15 13 15    Temp:    98.2 F (36.8 C)  TempSrc:    Oral  SpO2: 100% 100% 100%   Weight:      Height:       General: Not in acute distress.  Dry mucous membrane HEENT:       Eyes: PERRL, EOMI, no scleral icterus.       ENT: No discharge from the ears and nose, no pharynx injection, no tonsillar enlargement.        Neck: No JVD, no bruit, no mass felt. Heme: No neck lymph node enlargement. Cardiac: S1/S2, RRR, No murmurs, No gallops or rubs. Respiratory: No rales, wheezing, rhonchi or rubs. GI: Soft, nondistended, nontender, no organomegaly, BS present. GU: No hematuria Ext: has trace leg edema bilaterally. 1+DP/PT pulse bilaterally. Musculoskeletal: No joint deformities, No joint redness or warmth, no limitation of ROM in spin. Skin: No rashes.  Neuro: Confused, knows her own name, oriented to place, but not orientated to the time. cranial nerves II-XII grossly intact, moves all extremities normally.  Psych: Patient is not psychotic, no suicidal or hemocidal ideation.  Labs on Admission: I have personally reviewed following labs and imaging studies  CBC: Recent Labs  Lab 08/07/20 1157  WBC 5.1  HGB 9.7*  HCT 29.5*  MCV 95.8  PLT 95*   Basic Metabolic Panel: Recent Labs  Lab 08/07/20 1157 08/07/20 1527  NA 122* 127*  K 4.9 5.2*  CL 87* 91*  CO2 20* 22  GLUCOSE 1,166* 961*  BUN 59* 57*  CREATININE 1.93* 1.75*  CALCIUM 9.2 9.5  MG 1.6*  --    GFR: Estimated Creatinine Clearance: 32 mL/min (A) (by C-G formula based on SCr of 1.75 mg/dL (H)). Liver Function Tests: Recent Labs  Lab 08/07/20 1157  AST 92*  ALT 99*  ALKPHOS 623*  BILITOT 1.4*  PROT 6.7  ALBUMIN 3.7   No results for input(s): LIPASE, AMYLASE in the last 168 hours. Recent Labs  Lab 08/07/20 1556  AMMONIA 30    Coagulation Profile: No results for input(s): INR, PROTIME in the last 168 hours. Cardiac Enzymes: No results for input(s): CKTOTAL, CKMB, CKMBINDEX, TROPONINI in the last 168 hours. BNP (last 3 results) No results for input(s): PROBNP in the last 8760 hours. HbA1C: No results for input(s): HGBA1C in the last 72 hours. CBG: Recent Labs  Lab 08/07/20 1638 08/07/20 1711 08/07/20 1750 08/07/20 1831 08/07/20 1919  GLUCAP >600* >600* >600* 509* 425*   Lipid Profile: No results for input(s): CHOL, HDL, LDLCALC, TRIG, CHOLHDL, LDLDIRECT in the last 72 hours. Thyroid Function Tests: Recent Labs    08/07/20 1157  TSH 1.593   Anemia Panel: No results for input(s): VITAMINB12, FOLATE, FERRITIN,  TIBC, IRON, RETICCTPCT in the last 72 hours. Urine analysis:    Component Value Date/Time   COLORURINE STRAW (A) 08/07/2020 1319   APPEARANCEUR CLEAR (A) 08/07/2020 1319   LABSPEC 1.015 08/07/2020 1319   PHURINE 6.0 08/07/2020 1319   GLUCOSEU >=500 (A) 08/07/2020 1319   HGBUR NEGATIVE 08/07/2020 1319   BILIRUBINUR NEGATIVE 08/07/2020 1319   KETONESUR NEGATIVE 08/07/2020 1319   PROTEINUR NEGATIVE 08/07/2020 1319   NITRITE NEGATIVE 08/07/2020 1319   LEUKOCYTESUR NEGATIVE 08/07/2020 1319   Sepsis Labs: @LABRCNTIP (procalcitonin:4,lacticidven:4) ) Recent Results (from the past 240 hour(s))  SARS Coronavirus 2 by RT PCR (hospital order, performed in [place] hospital lab) Nasopharyngeal Nasopharyngeal Swab     Status: None   Collection Time: 08/07/20  1:19 PM   Specimen: Nasopharyngeal Swab  Result Value Ref Range Status   SARS Coronavirus 2 NEGATIVE NEGATIVE Final    Comment: (NOTE) SARS-CoV-2 target nucleic acids are NOT DETECTED.  The SARS-CoV-2 RNA is generally detectable in upper and lower respiratory specimens during the acute phase of infection. The lowest concentration of SARS-CoV-2 viral copies this assay can detect is 250 copies / mL. A negative result does not  preclude SARS-CoV-2 infection and should not be used as the sole basis for treatment or other patient management decisions.  A negative result may occur with improper specimen collection / handling, submission of specimen other than nasopharyngeal swab, presence of viral mutation(s) within the areas targeted by this assay, and inadequate number of viral copies (<250 copies / mL). A negative result must be combined with clinical observations, patient history, and epidemiological information.  Fact Sheet for Patients:   StrictlyIdeas.no  Fact Sheet for Healthcare Providers: BankingDealers.co.za  This test is not yet approved or  cleared by the [place] FDA and has been authorized for detection and/or diagnosis of SARS-CoV-2 by FDA under an Emergency Use Authorization (EUA).  This EUA will remain in effect (meaning this test can be used) for the duration of the COVID-19 declaration under Section 564(b)(1) of the Act, 21 U.S.C. section 360bbb-3(b)(1), unless the authorization is terminated or revoked sooner.  Performed at [facility], [address] [place],  [address]      Radiological Exams on Admission: DG Chest [facility] 1 View  Result Date: 08/07/2020 CLINICAL DATA:  Recent CVA. EXAM: PORTABLE CHEST 1 VIEW COMPARISON:  07/17/2020. FINDINGS: Mediastinum and hilar structures normal. Heart size normal. No focal infiltrate. No pleural effusion or pneumothorax. Mild thoracic spine scoliosis concave left. IMPRESSION: No acute cardiopulmonary disease. Electronically Signed   By: Marcello Moores  Register   On: 08/07/2020 14:21     EKG: Independently reviewed.  Sinus rhythm, QTC 538, T wave flattening, early R wave progression, T wave inversion in lead III/aVF.  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 - Place in SDU for obs -1L of LR and 1L of NS bolus  - insulin gtt - BMP q4h - IVF: LR at 100 cc/h, will switch to D5 100  cc/h when CBG<250 - replete K as needed - Zofran prn nausea  - NPO   Acute metabolic encephalopathy: Likely due to HHS, but given recent history of subdural hematoma, will get CT of head -Follow-up CT head -Frequent neuro check  Anemia of chronic disease: Hemoglobin 10.4 on 07/29/2020--> 9.7.  -Follow-up with CBC  Acute renal failure superimposed on stage 4 chronic kidney disease ([place]): likely due to dehydration -IV fluid as above -Avoid renal toxic medications  Other cirrhosis of liver ([place]): Ammonia level normal 30.  Liver function with ALP 623, AST 92, ALT 99, total bilirubin 1.4 -Avoid using Tylenol. -Check PTT And INR -Continue propranolol  Thrombocytopenia: Platelet 95, likely due to liver cirrhosis -f/u CBC  EKG recent A1c 9.5, poorly controlled.  Patient is taking NovoLog and Lantus -Currently on insulin drip  HTN (hypertension) -IV hydralazine as needed -Amlodipine, propranolol  SAH (subarachnoid hemorrhage) (HCC) -On Keppra prophylaxis  Hypomagnesemia: Magnesium 1.6 -Repleted magnesium  Elevated troponin: Troponin 53.  No chest pain.  Likely due to demand ischemia. -Will not start aspirin due to recent subdural hematoma -Continue Lipitor -Trend troponin -Check UDS, A1c, FLP -Repeat EKG pneumonia  HLD: -Lipitor         DVT ppx: SCD Code Status: Full code Family Communication:   Yes, patient's  daughter by phone Disposition Plan:  Anticipate discharge back to previous environment Consults called: None Admission status: SDU/obs       Status is: Observation  The patient remains OBS appropriate and will d/c before 2 midnights.  Dispo: The patient is from: Home              Anticipated d/c is to: Home              Anticipated d/c date is: 1 day              Patient currently is not medically stable to d/c.           Date of Service 08/07/2020    Ivor Costa [facility]   If 7PM-7AM, please contact night-coverage [email or web address] 08/07/2020, 7:33 PM

## 2020-08-07 NOTE — ED Notes (Signed)
Phlebotomy at bedside.

## 2020-08-08 ENCOUNTER — Inpatient Hospital Stay
Admit: 2020-08-08 | Discharge: 2020-08-08 | Disposition: A | Discharge status: Home / Self Care | Payer: Medicare HMO | Attending: Acute Care | Admitting: Acute Care

## 2020-08-08 ENCOUNTER — Inpatient Hospital Stay: Payer: Medicare HMO

## 2020-08-08 ENCOUNTER — Observation Stay: Payer: Medicare HMO

## 2020-08-08 DIAGNOSIS — I639 Cerebral infarction, unspecified: Secondary | ICD-10-CM

## 2020-08-08 DIAGNOSIS — G9341 Metabolic encephalopathy: Secondary | ICD-10-CM

## 2020-08-08 DIAGNOSIS — D638 Anemia in other chronic diseases classified elsewhere: Secondary | ICD-10-CM | POA: Diagnosis not present

## 2020-08-08 DIAGNOSIS — E1100 Type 2 diabetes mellitus with hyperosmolarity without nonketotic hyperglycemic-hyperosmolar coma (NKHHC): Secondary | ICD-10-CM | POA: Diagnosis not present

## 2020-08-08 DIAGNOSIS — I10 Essential (primary) hypertension: Secondary | ICD-10-CM | POA: Diagnosis not present

## 2020-08-08 DIAGNOSIS — E1165 Type 2 diabetes mellitus with hyperglycemia: Secondary | ICD-10-CM | POA: Diagnosis not present

## 2020-08-08 LAB — HEMOGLOBIN A1C
Hgb A1c MFr Bld: 9.7 % — ABNORMAL HIGH (ref 4.8–5.6)
Mean Plasma Glucose: 231.69 mg/dL

## 2020-08-08 LAB — CBC
HCT: 28.5 % — ABNORMAL LOW (ref 36.0–46.0)
Hemoglobin: 10.1 g/dL — ABNORMAL LOW (ref 12.0–15.0)
MCH: 31.7 pg (ref 26.0–34.0)
MCHC: 35.4 g/dL (ref 30.0–36.0)
MCV: 89.3 fL (ref 80.0–100.0)
Platelets: 108 10*3/uL — ABNORMAL LOW (ref 150–400)
RBC: 3.19 MIL/uL — ABNORMAL LOW (ref 3.87–5.11)
RDW: 12.6 % (ref 11.5–15.5)
WBC: 5.6 10*3/uL (ref 4.0–10.5)
nRBC: 0 % (ref 0.0–0.2)

## 2020-08-08 LAB — LIPID PANEL
Cholesterol: 120 mg/dL (ref 0–200)
HDL: 76 mg/dL (ref >40)
LDL Cholesterol: 36 mg/dL (ref 0–99)
Total CHOL/HDL Ratio: 1.6 RATIO
Triglycerides: 40 mg/dL (ref <150)
VLDL: 8 mg/dL (ref 0–40)

## 2020-08-08 LAB — BASIC METABOLIC PANEL
Anion gap: 11 (ref 5–15)
Anion gap: 10 (ref 5–15)
BUN: 50 mg/dL — ABNORMAL HIGH (ref 6–20)
BUN: 47 mg/dL — ABNORMAL HIGH (ref 6–20)
CO2: 28 mmol/L (ref 22–32)
CO2: 29 mmol/L (ref 22–32)
Calcium: 9.7 mg/dL (ref 8.9–10.3)
Calcium: 10 mg/dL (ref 8.9–10.3)
Chloride: 100 mmol/L (ref 98–111)
Chloride: 99 mmol/L (ref 98–111)
Creatinine, Ser: 1.42 mg/dL — ABNORMAL HIGH (ref 0.44–1.00)
Creatinine, Ser: 1.5 mg/dL — ABNORMAL HIGH (ref 0.44–1.00)
GFR calc Af Amer: 46 mL/min — ABNORMAL LOW (ref >60)
GFR calc Af Amer: 43 mL/min — ABNORMAL LOW (ref >60)
GFR calc non Af Amer: 40 mL/min — ABNORMAL LOW (ref >60)
GFR calc non Af Amer: 37 mL/min — ABNORMAL LOW (ref >60)
Glucose, Bld: 150 mg/dL — ABNORMAL HIGH (ref 70–99)
Glucose, Bld: 175 mg/dL — ABNORMAL HIGH (ref 70–99)
Potassium: 3.9 mmol/L (ref 3.5–5.1)
Potassium: 3.8 mmol/L (ref 3.5–5.1)
Sodium: 139 mmol/L (ref 135–145)
Sodium: 138 mmol/L (ref 135–145)

## 2020-08-08 LAB — GLUCOSE, CAPILLARY
Glucose-Capillary: 129 mg/dL — ABNORMAL HIGH (ref 70–99)
Glucose-Capillary: 130 mg/dL — ABNORMAL HIGH (ref 70–99)
Glucose-Capillary: 142 mg/dL — ABNORMAL HIGH (ref 70–99)
Glucose-Capillary: 152 mg/dL — ABNORMAL HIGH (ref 70–99)
Glucose-Capillary: 239 mg/dL — ABNORMAL HIGH (ref 70–99)
Glucose-Capillary: 372 mg/dL — ABNORMAL HIGH (ref 70–99)
Glucose-Capillary: 402 mg/dL — ABNORMAL HIGH (ref 70–99)

## 2020-08-08 LAB — ECHOCARDIOGRAM COMPLETE
AR max vel: 2.56 cm2
AV Area VTI: 3.8 cm2
AV Area mean vel: 2.83 cm2
AV Mean grad: 2 mmHg
AV Peak grad: 3.8 mmHg
Ao pk vel: 0.98 m/s
Area-P 1/2: 5.93 cm2
Height: 66 in
S' Lateral: 2.36 cm
Weight: 2391.55 oz

## 2020-08-08 LAB — HEPARIN LEVEL (UNFRACTIONATED)
Heparin Unfractionated: 1.06 IU/mL — ABNORMAL HIGH (ref 0.30–0.70)
Heparin Unfractionated: 0.41 IU/mL (ref 0.30–0.70)

## 2020-08-08 LAB — PROTIME-INR
INR: 1.1 (ref 0.8–1.2)
Prothrombin Time: 13.5 seconds (ref 11.4–15.2)

## 2020-08-08 LAB — TROPONIN I (HIGH SENSITIVITY)
Troponin I (High Sensitivity): 82 ng/L — ABNORMAL HIGH (ref <18)
Troponin I (High Sensitivity): 87 ng/L — ABNORMAL HIGH (ref <18)

## 2020-08-08 LAB — APTT: aPTT: 24 seconds (ref 24–36)

## 2020-08-08 IMAGING — US US EXTREM LOW VENOUS
1 series · 13 of 24 positions shown · non-contrast
Comparison: None.

CLINICAL DATA: Bilateral leg swelling

EXAM:
BILATERAL LOWER EXTREMITY VENOUS DOPPLER ULTRASOUND
TECHNIQUE: Gray-scale sonography with compression, as well as color and duplex
ultrasound, were performed to evaluate the deep venous systems of
the bilateral lower extremities. From the level of the common
femoral vein through the popliteal and proximal calf veins.

[Series 1: us venous img lower bilat (dvt) · portal-venous · 13 of 66 slices shown]
[im 1/66]
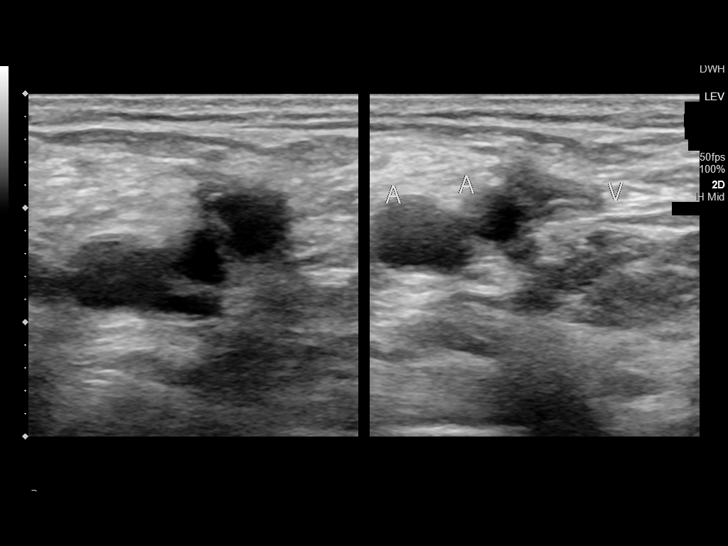
[im 6/66]
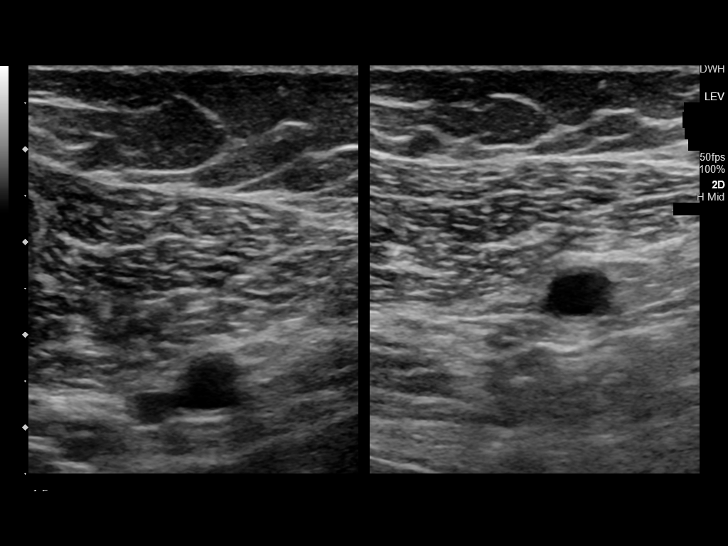
[im 12/66]
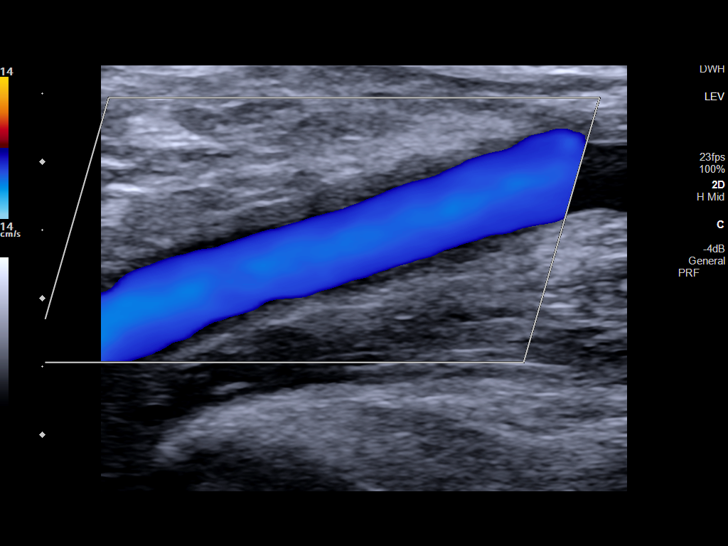
[im 17/66]
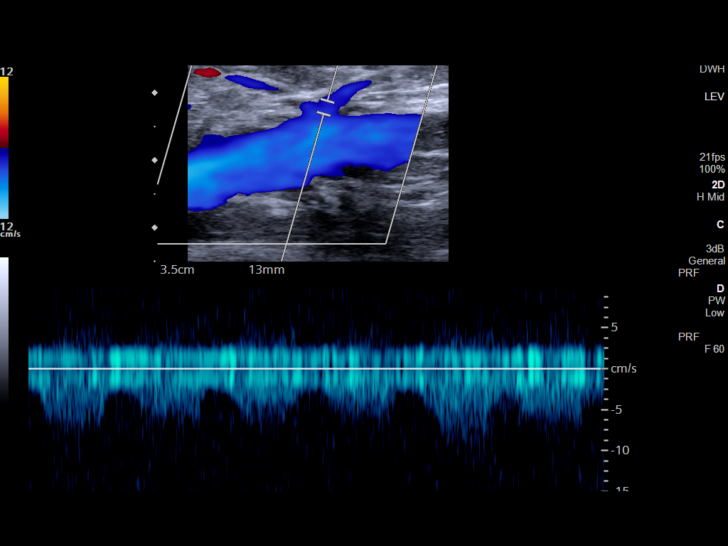
[im 23/66]
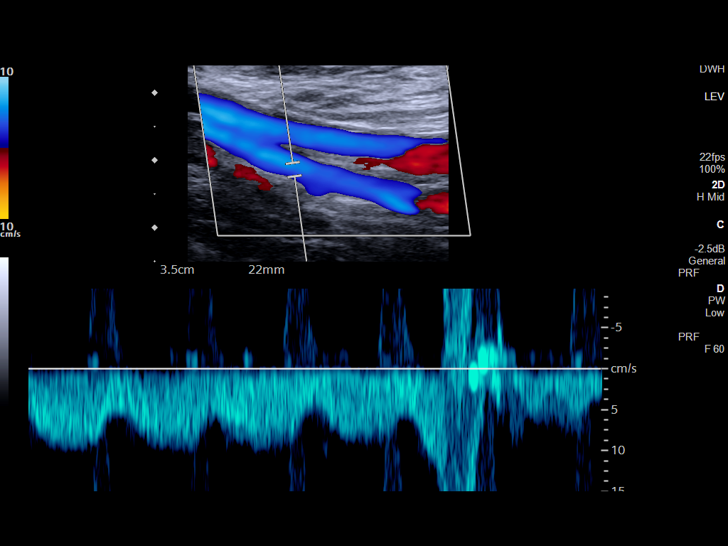
[im 29/66]
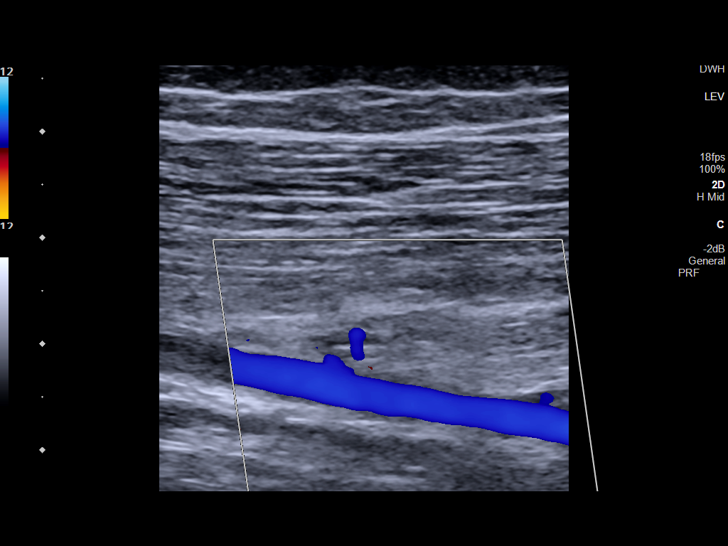
[im 34/66]
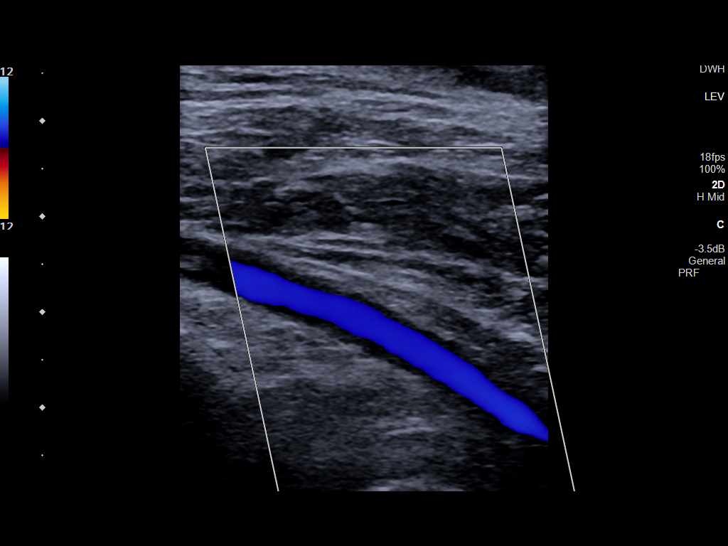
[im 37/66]
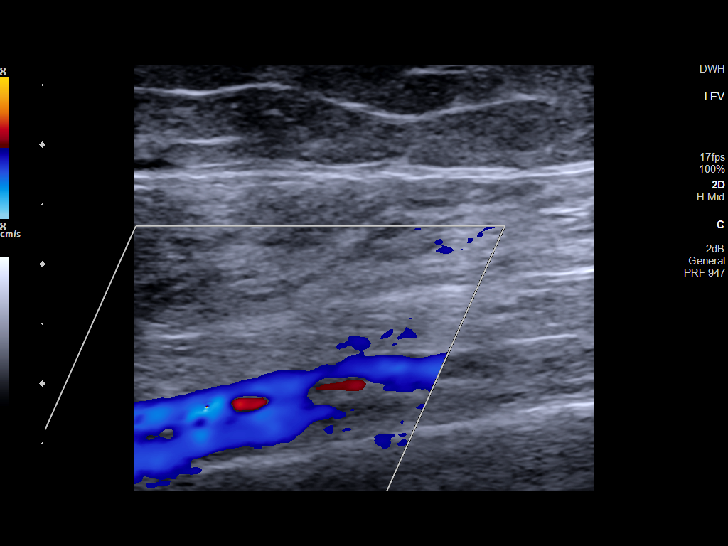
[im 43/66]
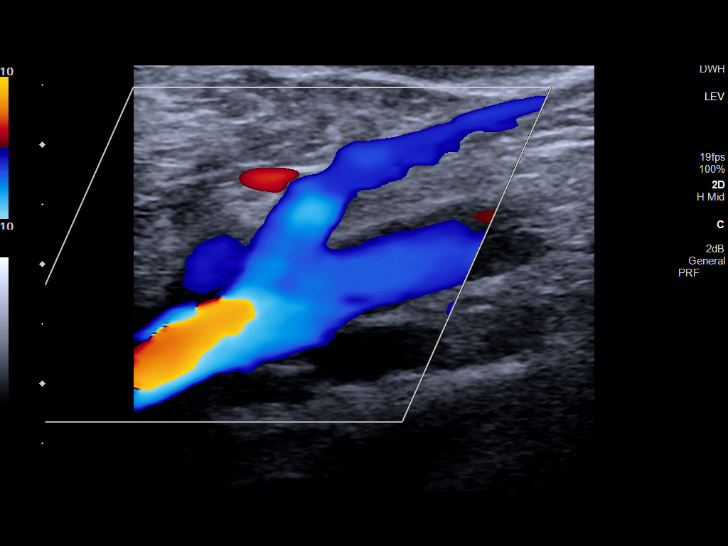
[im 49/66]
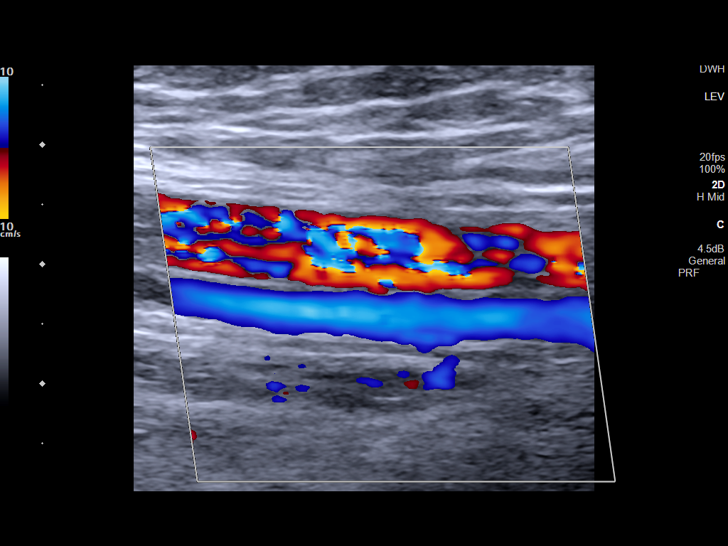
[im 54/66]
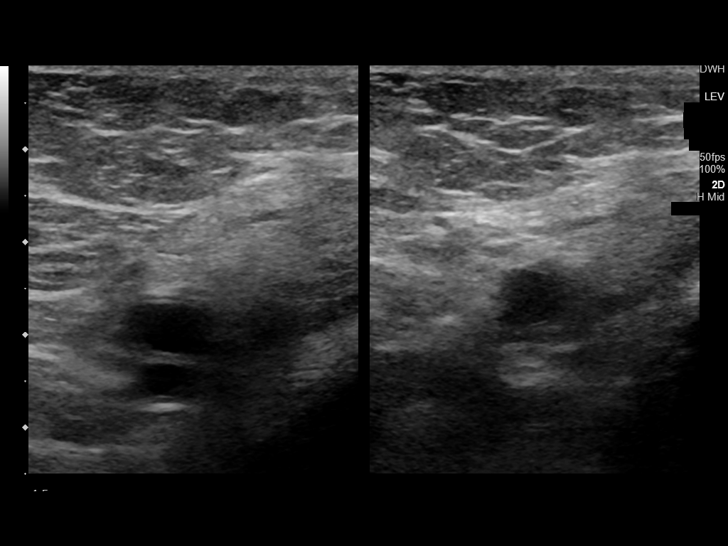
[im 60/66]
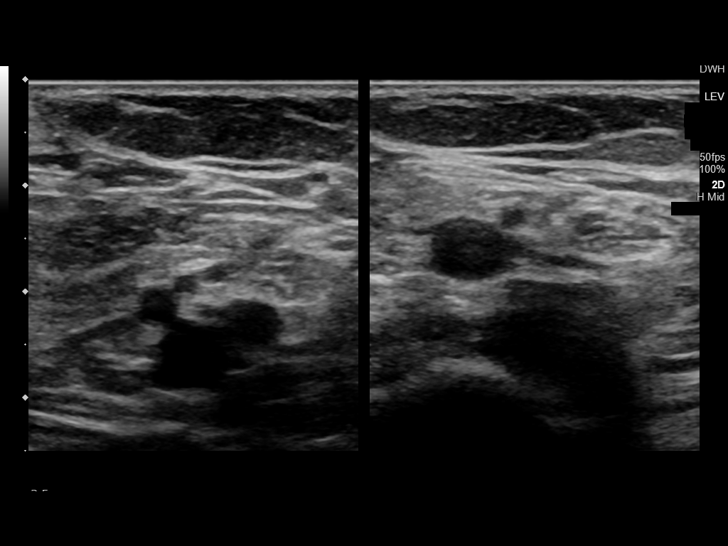
[im 66/66]
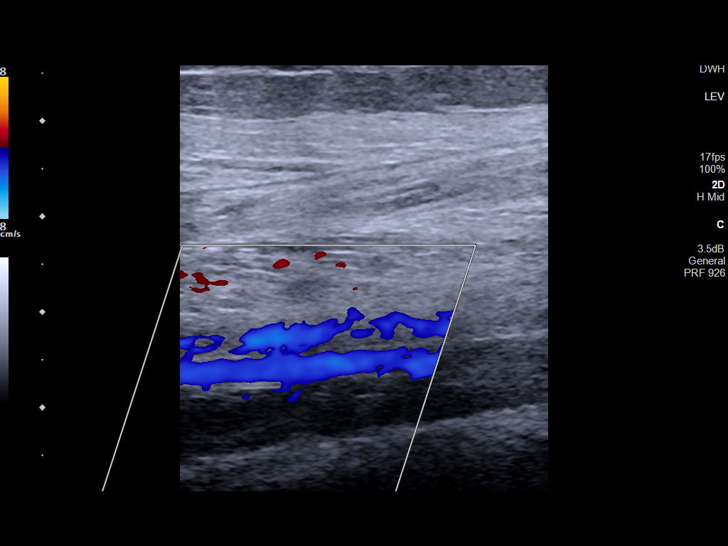

[13 of 24 positions shown; findings below may reference images not displayed]

FINDINGS: VENOUS

Right lower extremity:

Largely peripherally marginated echogenic thrombus which appears
incompletely occlusive and only partially compressible is seen
within the common femoral, saphenofemoral junction, femoral and
popliteal veins. Preservation of the phasicity and augmentation is
noted. The profundus femoral vein and calf veins appear widely
patent.

Left lower extremity:

Normal compressibility of the common femoral, superficial femoral,
and popliteal veins, as well as the visualized calf veins.
Visualized portions of profunda femoral vein and great saphenous
vein unremarkable. No filling defects to suggest DVT on grayscale or
color Doppler imaging. Doppler waveforms show normal direction of
venous flow, normal respiratory plasticity and response to
augmentation.

OTHER

None.

Limitations: none
IMPRESSION: Incompletely compressible nonocclusive filling defect within the
right lower extremity extending from the popliteal to the common
femoral vein has an appearance which could suggest remote/chronic
thrombus though is overall age indeterminate. Sparing of the
profundus and calf veins.

No deep venous thrombosis is seen in the left lower extremity.

## 2020-08-08 IMAGING — US US CAROTID DUPLEX BILAT
1 series · 13 of 24 positions shown · non-contrast
Comparison: Brain MRI [DATE]

CLINICAL DATA: Right cerebellar infarct.

EXAM:
BILATERAL CAROTID DUPLEX ULTRASOUND
TECHNIQUE: Gray scale imaging, color Doppler and duplex ultrasound were
performed of bilateral carotid and vertebral arteries in the neck.

[Series 1: us carotid bilateral · 13 of 82 slices shown]
[im 1/82]
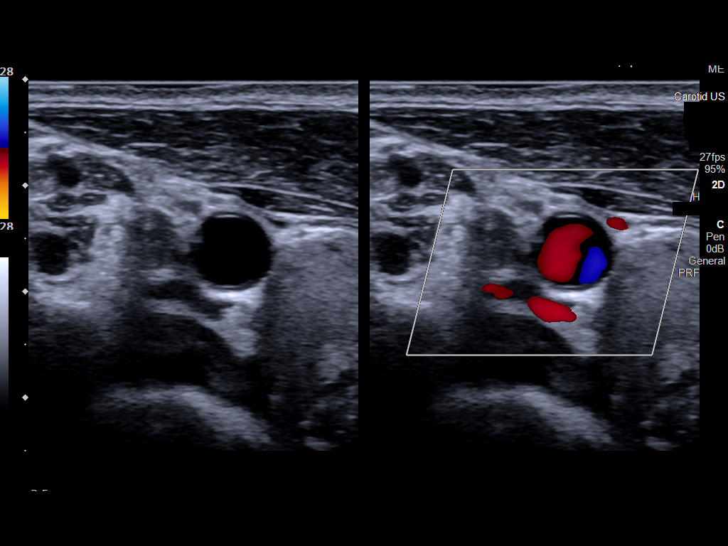
[im 8/82]
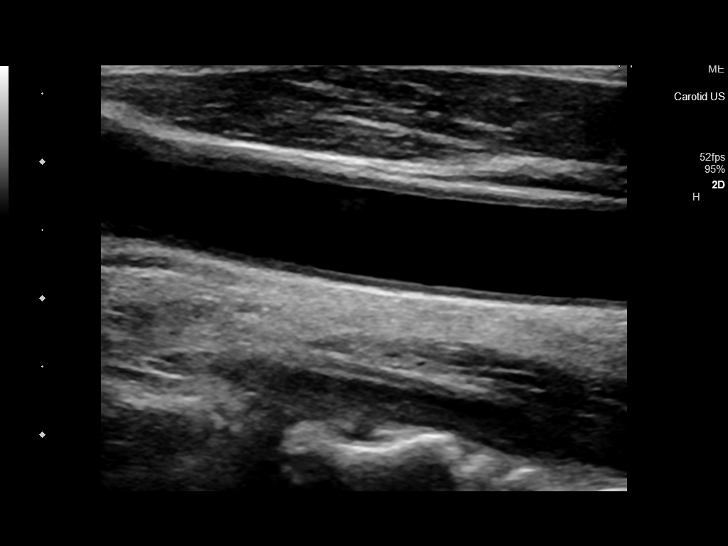
[im 15/82]
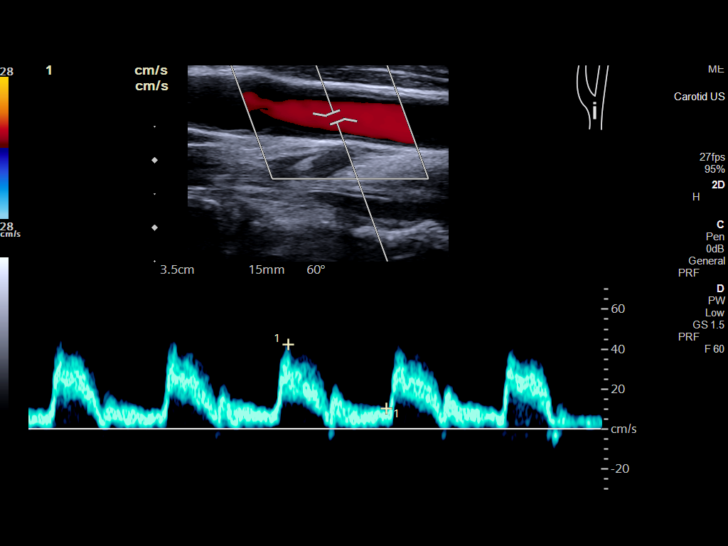
[im 22/82]
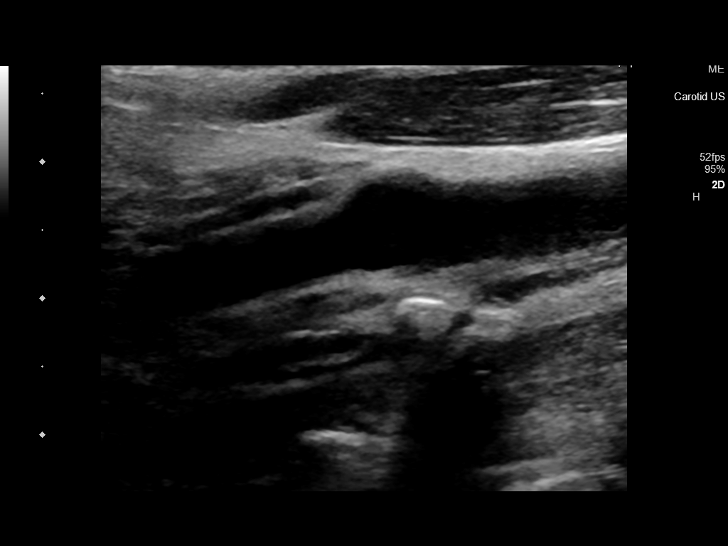
[im 29/82]
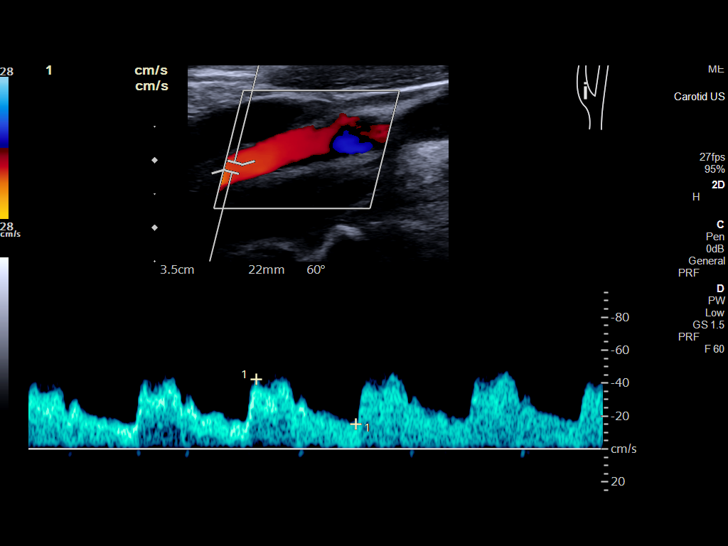
[im 36/82]
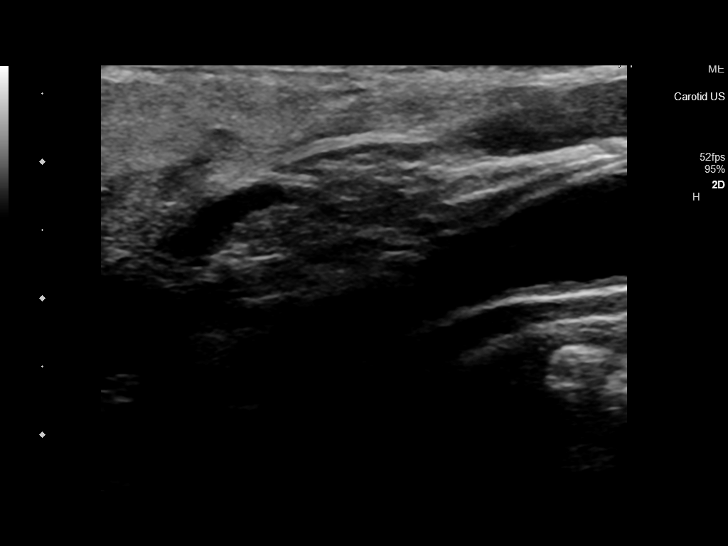
[im 43/82]
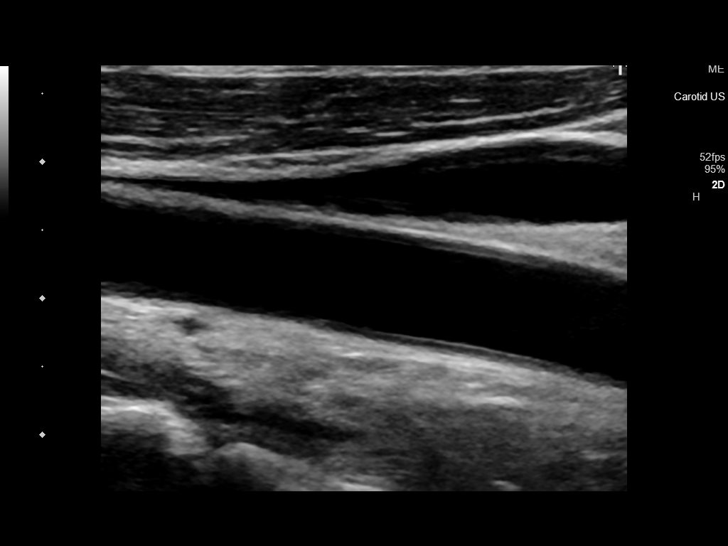
[im 46/82]
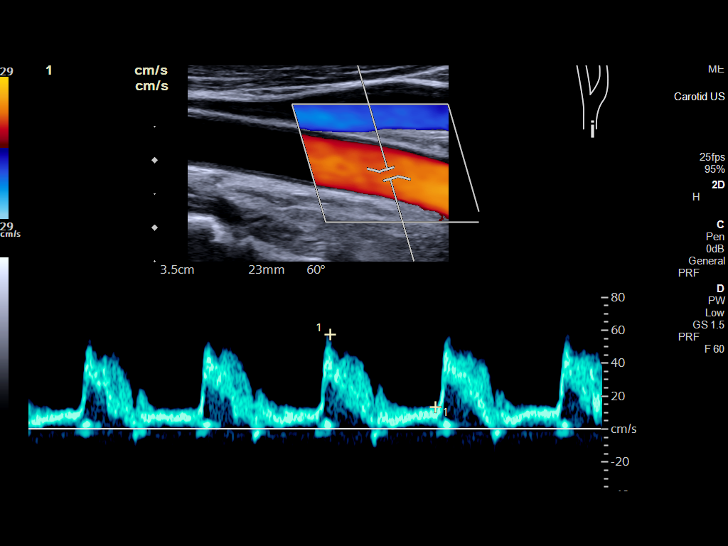
[im 53/82]
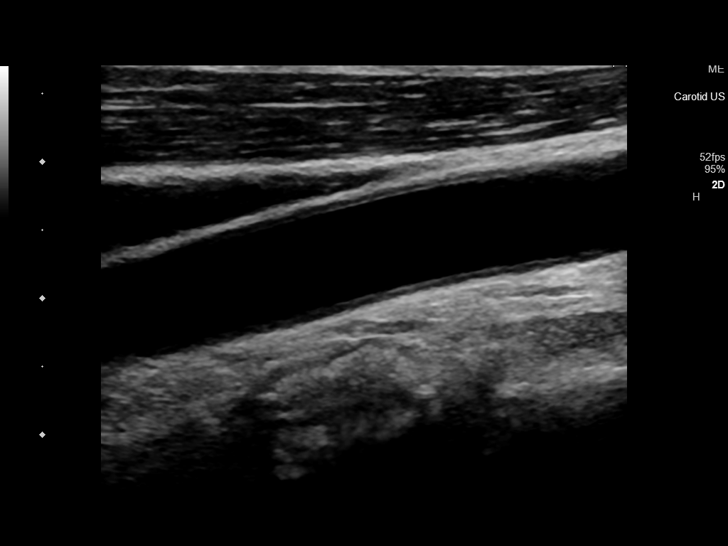
[im 60/82]
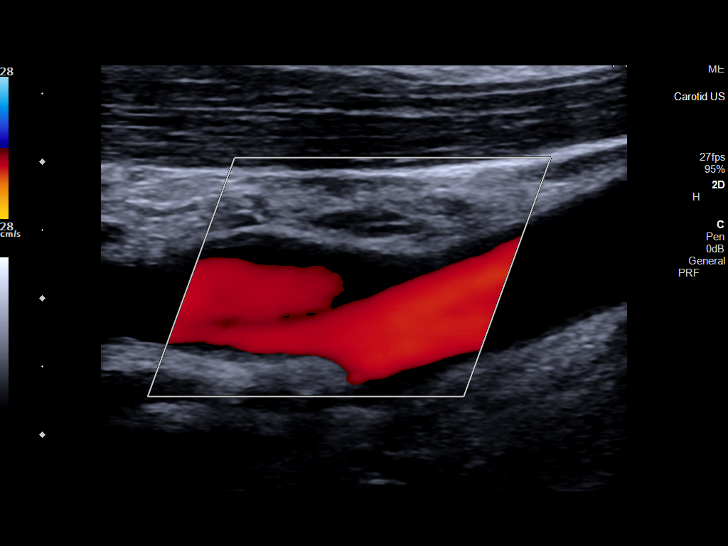
[im 67/82]
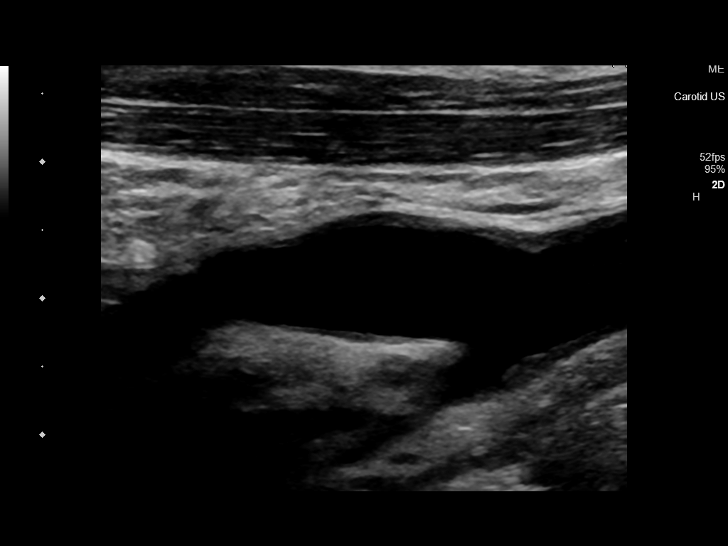
[im 74/82]
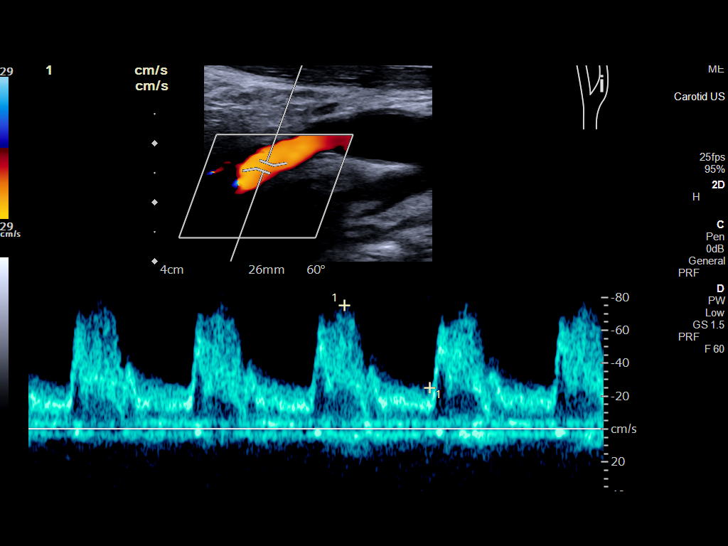
[im 82/82]
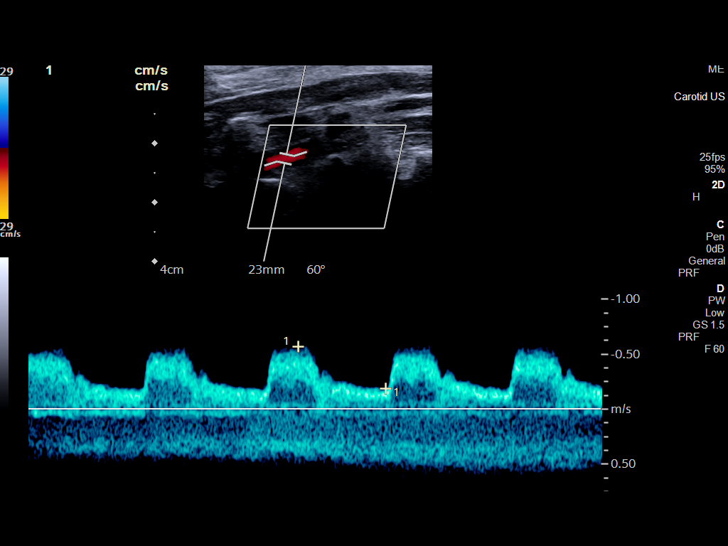

[13 of 24 positions shown; findings below may reference images not displayed]

FINDINGS: Criteria: Quantification of carotid stenosis is based on velocity
parameters that correlate the residual internal carotid diameter
with NASCET-based stenosis levels, using the diameter of the distal
internal carotid lumen as the denominator for stenosis measurement.

The following velocity measurements were obtained:

RIGHT

ICA: 63/21 cm/sec

CCA: 45/11 cm/sec

SYSTOLIC ICA/CCA RATIO:

ECA: 71 cm/sec

LEFT

ICA: 98/35 cm/sec

CCA: 61/18 cm/sec

SYSTOLIC ICA/CCA RATIO:

ECA: 50 cm/sec

RIGHT CAROTID ARTERY: Intimal thickening at the carotid bulb.
External carotid artery is patent with normal waveform. Normal
waveforms and velocities in the internal carotid artery.

RIGHT VERTEBRAL ARTERY: Antegrade flow and normal waveform in the
right vertebral artery.

LEFT CAROTID ARTERY: Intimal thickening in the left internal carotid
artery. External carotid artery is patent with normal waveform.
Normal waveforms and velocities in the internal carotid artery.

LEFT VERTEBRAL ARTERY: Antegrade flow and normal waveform in the
left vertebral artery.
IMPRESSION: 1. Bilateral carotid arteries are patent without significant
stenosis or plaque.
2. Patent vertebral arteries with antegrade flow.

## 2020-08-08 MED ORDER — INSULIN ASPART 100 UNIT/ML ~~LOC~~ SOLN: 0.0000 [IU] | Freq: Every day | SUBCUTANEOUS | Status: DC

## 2020-08-08 MED ORDER — ENSURE MAX PROTEIN PO LIQD
11.0000 [oz_av] | Freq: Two times a day (BID) | ORAL | Status: DC
  Administered 2020-08-09: 11 [oz_av] via ORAL
  Filled 2020-08-08: qty 330

## 2020-08-08 MED ORDER — ADULT MULTIVITAMIN W/MINERALS CH
1.0000 | ORAL_TABLET | Freq: Every day | ORAL | Status: DC
  Administered 2020-08-08 – 2020-08-09 (×2): 1 via ORAL
  Filled 2020-08-08 (×2): qty 1

## 2020-08-08 MED ORDER — LACTATED RINGERS IV SOLN: INTRAVENOUS | Status: DC

## 2020-08-08 MED ORDER — NICOTINE 21 MG/24HR TD PT24
21.0000 mg | MEDICATED_PATCH | Freq: Every day | TRANSDERMAL | Status: DC
  Administered 2020-08-09: 21 mg via TRANSDERMAL
  Filled 2020-08-08 (×2): qty 1

## 2020-08-08 MED ORDER — HYDRALAZINE HCL 20 MG/ML IJ SOLN: 10.0000 mg | Freq: Four times a day (QID) | INTRAMUSCULAR | Status: DC | PRN

## 2020-08-08 MED ORDER — AMLODIPINE BESYLATE 10 MG PO TABS
10.0000 mg | ORAL_TABLET | Freq: Every day | ORAL | Status: DC
  Administered 2020-08-08 – 2020-08-09 (×2): 10 mg via ORAL
  Filled 2020-08-08: qty 2
  Filled 2020-08-08: qty 1

## 2020-08-08 MED ORDER — INSULIN GLARGINE 100 UNIT/ML ~~LOC~~ SOLN
6.0000 [IU] | Freq: Every day | SUBCUTANEOUS | Status: DC
  Administered 2020-08-08: 6 [IU] via SUBCUTANEOUS
  Filled 2020-08-08 (×2): qty 0.06

## 2020-08-08 MED ORDER — PROPRANOLOL HCL 10 MG PO TABS
10.0000 mg | ORAL_TABLET | Freq: Three times a day (TID) | ORAL | Status: DC
  Administered 2020-08-08 – 2020-08-09 (×4): 10 mg via ORAL
  Filled 2020-08-08 (×6): qty 1

## 2020-08-08 MED ORDER — HEPARIN BOLUS VIA INFUSION
3000.0000 [IU] | Freq: Once | INTRAVENOUS | Status: AC
  Administered 2020-08-08: 3000 [IU] via INTRAVENOUS
  Filled 2020-08-08: qty 3000

## 2020-08-08 MED ORDER — HEPARIN (PORCINE) 25000 UT/250ML-% IV SOLN
750.0000 [IU]/h | INTRAVENOUS | Status: DC
  Administered 2020-08-08: 750 [IU]/h via INTRAVENOUS
  Filled 2020-08-08: qty 250

## 2020-08-08 MED ORDER — HEPARIN (PORCINE) 25000 UT/250ML-% IV SOLN
600.0000 [IU]/h | INTRAVENOUS | Status: DC
  Administered 2020-08-08: 550 [IU]/h via INTRAVENOUS

## 2020-08-08 MED ORDER — ATORVASTATIN CALCIUM 20 MG PO TABS
80.0000 mg | ORAL_TABLET | Freq: Every day | ORAL | Status: DC
  Administered 2020-08-08 – 2020-08-09 (×2): 80 mg via ORAL
  Filled 2020-08-08 (×3): qty 4

## 2020-08-08 NOTE — Evaluation (Signed)
Occupational Therapy Evaluation Patient Details Name: Susan Navarro MRN: [id number] DOB: 10/02/1961 Today's Date: 08/08/2020    History of Present Illness Pt is a 59 y.o. female with PMH of: DM1, DKA, HTN, CKD4, anemia, recent admission due to [facility], liver cirrhosis, CVA, thrombocytopenia, as well as superior and inferior pubic rami fx. Per MD impression, pt currently presents w/ hyperosmolar hyperglycemic state, acute metabolic encephalopathy, anemia, acute renal failure on CKD4, elevated troponin likely due to demand ischemia, and HLD. MRI 8/17 indicates intermediate sized acute infarct of R cerebellum w/ no acute hemorrhage of mass effect, multiple old infarcts, and chronic ischemic microangiopathy.   Clinical Impression   Patient presenting with decreased I in self care, balance, functional mobility/transfers, endurance, and safety awareness. Patient appears to be poor historian with conflicting information during evaluation about home set up. Pt reports her daughter living with her and being independent PTA. Prior chart from 2 weeks ago reports pt using RW at home.  Patient currently functioning at min A overall . Patient will benefit from acute OT to increase overall independence in the areas of ADLs, functional mobility, and safety awareness in order to safely discharge home with caregiver .    Follow Up Recommendations  Home health OT;Supervision - Intermittent    Equipment Recommendations  3 in 1 bedside commode       Precautions / Restrictions Precautions Precautions: Fall;Other (comment) Precaution Comments: seizure precautions Restrictions Weight Bearing Restrictions: No Other Position/Activity Restrictions: recent (5-6wk) old superior and inferior pubic rami fx      Mobility Bed Mobility Overal bed mobility: Modified Independent Bed Mobility: Supine to Sit;Sit to Supine     Supine to sit: Modified independent (Device/Increase time);HOB elevated Sit to supine: Modified  independent (Device/Increase time)   General bed mobility comments: generally moved well; req inc time, no physical assist.motivated  Transfers Overall transfer level: Needs assistance Equipment used: None Transfers: Sit to/from Stand Sit to Stand: Min guard         General transfer comment: Good eccentric control    Balance Overall balance assessment: Needs assistance Sitting-balance support: Feet unsupported;No upper extremity supported Sitting balance-Leahy Scale: Good Sitting balance - Comments: pt able to sit without UE support     Standing balance-Leahy Scale: Fair Standing balance comment: Able to maintain steady standing w/ feet apart and no UE assist. Pt ambulates while holding onto IV pole          ADL either performed or assessed with clinical judgement   ADL Overall ADL's : Needs assistance/impaired     Grooming: Wash/dry hands;Wash/dry face;Standing;Min guard       Lower Body Bathing: Min guard;Sit to/from stand   Upper Body Dressing : Min guard;Standing   Lower Body Dressing: Minimal assistance;Sit to/from stand        General ADL Comments: Pt supine in bed and incontient with hospital gown and linens wet. Pt standing from bed with min guard and utilizing wipes to cleanse self and change brief with min A overall. Pt needing mod multimodal cuing to navigate LB clothing appropriately. UB self care with set up A.     Vision Baseline Vision/History: Wears glasses Wears Glasses: At all times Patient Visual Report: No change from baseline Vision Assessment?: Vision impaired- to be further tested in functional context Additional Comments: Pt appears to be having increased difficulty navigating clothing but unable follow formal assessment. To be assessed further.            Pertinent Vitals/Pain Pain Assessment: Faces  Faces Pain Scale: No hurt Pain Location: L leg due to history of pelvic fxs Pain Descriptors / Indicators: Sore Pain Intervention(s):  Limited activity within patient's tolerance;Monitored during session;Repositioned     Hand Dominance Right   Extremity/Trunk Assessment Upper Extremity Assessment Upper Extremity Assessment: RUE deficits/detail RUE Deficits / Details: BUE strength and ROM grossly >4+/5. Impaired but symmetrical coordination (supination/pronation, finger-nose). Unable to fully assess proprioception, visual field, etc 2/2 cognitive limitations and difficulty w/ command following. RUE Sensation: WNL LUE Sensation: WNL   Lower Extremity Assessment Lower Extremity Assessment: Defer to PT evaluation RLE Deficits / Details: grossly 4+/5 RLE Sensation: WNL RLE Coordination: WNL LLE Deficits / Details: mildly decreased L hip flexion 4/5, may be due to patients recent pelvic fx. Unable to fully test coordination and proprioception 2/2 cognitive limitations LLE Sensation: WNL LLE Coordination: WNL   Cervical / Trunk Assessment Cervical / Trunk Assessment: Normal   Communication Communication Communication: No difficulties   Cognition Arousal/Alertness: Awake/alert Behavior During Therapy: WFL for tasks assessed/performed Overall Cognitive Status: No family/caregiver present to determine baseline cognitive functioning       General Comments: Pt is A&Ox4. Conflicting hx w/in session and w/ previous sessions (>1wk ago). Pt stated she does not own AD, later stated owns 2WW, prev hx indicates significant ADs. Pt initially stated no AD use at home, later stated use of 2WW.              Home Living Family/patient expects to be discharged to:: Private residence Living Arrangements: Alone Available Help at Discharge: Family;Friend(s);Available PRN/intermittently Type of Home: Apartment Home Access: Stairs to enter Entrance Stairs-Number of Steps: 1 curb Entrance Stairs-Rails: None Home Layout: One level     Bathroom Shower/Tub: Tub/shower unit         Home Equipment: Youth worker - 2  wheels;Cane - single point;Toilet riser;Shower seat;Hand held shower head;Grab bars - tub/shower   Additional Comments: states she does not regularly use AD  Lives With: Alone    Prior Functioning/Environment Level of Independence: Independent with assistive device(s)    ADL's / Homemaking Assistance Needed: Pt reports her daughter works during the day and is there if she needs her but she does all ADLs and IADL tasks independently   Comments: Pt provides conflicting information across evaluation 2wks ago and today (as well as noted conflicting histories at that time; conflicting history w/in todays session). Pt appears to be an unreliable historian. Information regarding home set-up/PLOF is from previous chart review w/ some adjustments from todays hx        OT Problem List: Decreased strength;Decreased coordination;Decreased activity tolerance;Decreased safety awareness;Impaired balance (sitting and/or standing);Decreased knowledge of use of DME or AE;Impaired UE functional use      OT Treatment/Interventions: Self-care/ADL training;Therapeutic exercise;Therapeutic activities;Energy conservation;Patient/family education;DME and/or AE instruction;Balance training;Neuromuscular education;Cognitive remediation/compensation    OT Goals(Current goals can be found in the care plan section) Acute Rehab OT Goals Patient Stated Goal: walk better and go home OT Goal Formulation: With patient Time For Goal Achievement: 08/22/20 Potential to Achieve Goals: Good ADL Goals Pt Will Perform Grooming: with modified independence;standing Pt Will Transfer to Toilet: with modified independence;ambulating Pt Will Perform Toileting - Clothing Manipulation and hygiene: with modified independence;sit to/from stand  OT Frequency: Min 1X/week   Barriers to D/C: Decreased caregiver support             AM-PAC OT "6 Clicks" Daily Activity     Outcome Measure Help from another person eating meals?:  None  Help from another person taking care of personal grooming?: A Little Help from another person toileting, which includes using toliet, bedpan, or urinal?: A Little Help from another person bathing (including washing, rinsing, drying)?: A Little Help from another person to put on and taking off regular upper body clothing?: None Help from another person to put on and taking off regular lower body clothing?: A Little 6 Click Score: 20   End of Session Nurse Communication: Mobility status  Activity Tolerance: Patient tolerated treatment well Patient left: with call bell/phone within reach;in bed;with bed alarm set  OT Visit Diagnosis: Other abnormalities of gait and mobility (R26.89);Muscle weakness (generalized) (M62.81)                Time: 1601-0932 OT Time Calculation (min): 38 min Charges:  OT General Charges $OT Visit: 1 Visit OT Evaluation $OT Eval Low Complexity: 1 Low OT Treatments $Self Care/Home Management : 23-37 mins   Darleen Crocker, MS, OTR/L , CBIS ascom [phone]  08/08/20, 3:37 PM   08/08/2020, 3:34 PM

## 2020-08-08 NOTE — Progress Notes (Signed)
ANTICOAGULATION CONSULT NOTE - Initial Consult  Pharmacy Consult for Heparin Indication: VTE treatment, h/o stroke  No Known Allergies  Patient Measurements: Height: 5' 6"  (167.6 cm) Weight: 67.8 kg (149 lb 7.6 oz) IBW/kg (Calculated) : 59.3  HEPARIN DW (KG): 67.8  Vital Signs: Temp: 98.2 F (36.8 C) (08/17 1924) Temp Source: Oral (08/17 1924) BP: 131/65 (08/18 0000) Pulse Rate: 76 (08/18 0000)  Labs: Recent Labs    08/07/20 1157 08/07/20 1527 08/07/20 1836 08/07/20 2233  HGB 9.7*  --   --   --   HCT 29.5*  --   --   --   PLT 95*  --   --   --   CREATININE 1.93* 1.75*  --  1.47*  TROPONINIHS  --  53* 86* 108*    Estimated Creatinine Clearance: 38.1 mL/min (A) (by C-G formula based on SCr of 1.47 mg/dL (H)).   Medical History: Past Medical History:  Diagnosis Date  . Anemia   . Chronic kidney disease   . Diabetes mellitus without complication ([place])   . Hypertension   . Stroke [facility]) 03/2018   Ischemic stroke per [facility] records   Medications:  (Not in a hospital admission)   Assessment: Heparin initiated for VTE treatment.  Pt w/ h/o stroke, previously on ASA.  Chronic thrombocytopenia.  No anticoagulants per PTA med list. Baseline labs ordered.  Goal of Therapy:  Heparin level 0.3-0.7 units/ml Monitor platelets by anticoagulation protocol: Yes   Plan:  Heparin 3000 units IV bolus x 1 then infusion at 750 units/hr Check HL ~ 6 hours after heparin initiated  Hart Robinsons A 08/08/2020,3:29 AM

## 2020-08-08 NOTE — ED Notes (Addendum)
Pt alert for stroke scale and swallow screen. Pt had difficulty concentrating for stroke scale, some instructions had to be repeated multiple times.

## 2020-08-08 NOTE — ED Notes (Signed)
Pt given breakfast.

## 2020-08-08 NOTE — ED Notes (Signed)
NP BRandol Kern at bedside at this time. This RN obtained tele-nuero unit.

## 2020-08-08 NOTE — ED Notes (Signed)
Neurologist Dr Lucia Gaskins finished w/ his assessment, provided Dr w/ NP B. Morrison's number to confer w/ her.

## 2020-08-08 NOTE — Evaluation (Signed)
Physical Therapy Evaluation Patient Details Name: Susan Navarro MRN: [id number] DOB: 1961/04/26 Today's Date: 08/08/2020   History of Present Illness  Pt is a 59 y.o. female with PMH of: DM1, DKA, HTN, CKD4, anemia, recent admission due to [facility], liver cirrhosis, CVA, thrombocytopenia, as well as superior and inferior pubic rami fx. Per MD impression, pt currently presents w/ hyperosmolar hyperglycemic state, acute metabolic encephalopathy, anemia, acute renal failure on CKD4, elevated troponin likely due to demand ischemia, and HLD. MRI 8/17 indicates intermediate sized acute infarct of R cerebellum w/ no acute hemorrhage of mass effect, multiple old infarcts, and chronic ischemic microangiopathy.  Clinical Impression  Pt was pleasant and motivated to participate during the session. Pt reported L hip pain this session 2/2 fracture from a previous fall. Neurological screen revealed intact BUE/BLE sensation and strength w/ no notable asymmetries. UE coordination was mildly impaired, but symmetrical, as seen with supination/pronation, finger opposition, and finger-to-nose. Visual field and proprioceptive testing was attempted but pt unable to adequately follow commands 2/2 cognitive limitations. See "cognition" for further details on pt cognition and discrepancies w/ reported history. Pt globally mod-I w/ bed mobility. Pt demonstrated good eccentric control w/ sit-to-stand and required supervision. Pt generally demonstrated steady static standing balance w/ feet apart and no UE support on RW; pt was unable to attempt single leg balance w/o at least single UE support on RW. Pt was able to ambulate 177f in room w/ CGA and BUE support on RW; generally steady w/ a decreased gait speed and able to ambulate tight turns and backwards steps w/o LOB. Pt will benefit from HHPT services upon discharge to safely address deficits listed in patient problem list for decreased caregiver assistance and eventual return to  PLOF.      Follow Up Recommendations Home health PT;Supervision - Intermittent (pt reports currently recieving HHPT)    Equipment Recommendations  None recommended by PT    Recommendations for Other Services       Precautions / Restrictions Precautions Precautions: Fall;Other (comment) Precaution Comments: seizure precautions Restrictions Weight Bearing Restrictions: No      Mobility  Bed Mobility Overal bed mobility: Modified Independent Bed Mobility: Supine to Sit;Sit to Supine     Supine to sit: Modified independent (Device/Increase time);HOB elevated Sit to supine: Modified independent (Device/Increase time)   General bed mobility comments: generally moved well; req inc time, no physical assist  Transfers Overall transfer level: Needs assistance Equipment used: Rolling walker (2 wheeled) Transfers: Sit to/from Stand Sit to Stand: Supervision         General transfer comment: Good eccentric control  Ambulation/Gait Ambulation/Gait assistance: Supervision Gait Distance (Feet): 150 Feet; 20 Feet Assistive device: Rolling walker (2 wheeled) Gait Pattern/deviations: Step-through pattern Gait velocity: decreased   General Gait Details: Gait velocity gradually increased over course of session. Generally steady w/ no LOB w/ BUE support on RW. Good ability to make tight turns, ambulate backwards  Stairs            Wheelchair Mobility    Modified Rankin (Stroke Patients Only)       Balance Overall balance assessment: Needs assistance Sitting-balance support: Feet unsupported;No upper extremity supported Sitting balance-Leahy Scale: Good Sitting balance - Comments: pt able to sit without UE support     Standing balance-Leahy Scale: Fair Standing balance comment: Able to maintain steady standing w/ feet apart and no UE assist. Unable to maintain balance on single LE w/o at least single UE support on RW  Pertinent Vitals/Pain Pain Assessment: Faces Faces Pain Scale: Hurts a little bit Pain Location: L leg due to history of pelvic fxs Pain Descriptors / Indicators: Sore Pain Intervention(s): Limited activity within patient's tolerance;Monitored during session;Repositioned    Home Living Family/patient expects to be discharged to:: Private residence Living Arrangements: Alone Available Help at Discharge: Family;Friend(s);Available PRN/intermittently Type of Home: Apartment Home Access: Stairs to enter Entrance Stairs-Rails: None Entrance Stairs-Number of Steps: 1 curb Home Layout: One level Home Equipment: Youth worker - 2 wheels;Cane - single point;Toilet riser;Shower seat;Hand held shower head;Grab bars - tub/shower Additional Comments: states she does not regularly use AD    Prior Function Level of Independence: Needs assistance      ADL's / Homemaking Assistance Needed: boyfriend/daughter assist as needed, with bathing and dressing.  Comments: Pt provides conflicting information across evaluation 2wks ago and today (as well as noted conflicting histories at that time; conflicting history w/in todays session). Pt appears to be an unreliable historian. Information regarding home set-up/PLOF is from previous chart review w/ some adjustments from todays hx     Hand Dominance   Dominant Hand: Right    Extremity/Trunk Assessment   Upper Extremity Assessment Upper Extremity Assessment: RUE deficits/detail;LUE deficits/detail RUE Deficits / Details: BUE strength and ROM grossly >4+/5. Impaired but symmetrical coordination (supination/pronation, finger-nose). Unable to fully assess proprioception, visual field, etc 2/2 cognitive limitations and difficulty w/ command following. RUE Sensation: WNL LUE Sensation: WNL    Lower Extremity Assessment Lower Extremity Assessment: RLE deficits/detail RLE Deficits / Details: grossly 4+/5 RLE Sensation: WNL RLE  Coordination: WNL LLE Deficits / Details: mildly decreased L hip flexion 4/5, may be due to patients recent pelvic fx. Unable to fully test coordination and proprioception 2/2 cognitive limitations LLE Sensation: WNL LLE Coordination: WNL    Cervical / Trunk Assessment Cervical / Trunk Assessment: Normal  Communication   Communication: No difficulties  Cognition Arousal/Alertness: Awake/alert Behavior During Therapy: WFL for tasks assessed/performed Overall Cognitive Status: No family/caregiver present to determine baseline cognitive functioning                                 General Comments: Pt is A&Ox4. Conflicting hx w/in session and w/ previous sessions (>1wk ago). Pt stated she does not own AD, later stated owns 2WW, prev hx indicates significant ADs. Pt initially stated no AD use at home, later stated use of 2WW.      General Comments      Exercises     Assessment/Plan    PT Assessment Patient needs continued PT services  PT Problem List Decreased strength;Decreased activity tolerance;Decreased balance;Pain;Decreased mobility;Decreased knowledge of use of DME       PT Treatment Interventions DME instruction;Balance training;Gait training;Stair training;Functional mobility training;Patient/family education;Therapeutic activities;Therapeutic exercise    PT Goals (Current goals can be found in the Care Plan section)  Acute Rehab PT Goals Patient Stated Goal: walk better PT Goal Formulation: With patient Time For Goal Achievement: 08/21/20 Potential to Achieve Goals: Good    Frequency Min 2X/week   Barriers to discharge        Co-evaluation               AM-PAC PT "6 Clicks" Mobility  Outcome Measure Help needed turning from your back to your side while in a flat bed without using bedrails?: None Help needed moving from lying on your back to sitting on the side of a flat  bed without using bedrails?: None Help needed moving to and from a bed  to a chair (including a wheelchair)?: None Help needed standing up from a chair using your arms (e.g., wheelchair or bedside chair)?: None Help needed to walk in hospital room?: A Little Help needed climbing 3-5 steps with a railing? : A Little 6 Click Score: 22    End of Session Equipment Utilized During Treatment: Gait belt Activity Tolerance: Patient tolerated treatment well Patient left: in bed;with call bell/phone within reach Nurse Communication: Mobility status PT Visit Diagnosis: Muscle weakness (generalized) (M62.81);Difficulty in walking, not elsewhere classified (R26.2);Pain;Unsteadiness on feet (R26.81) Pain - Right/Left: Left Pain - part of body: Hip    Time: 0447-1580 PT Time Calculation (min) (ACUTE ONLY): 39 min   Charges:              Jenefer Woerner SPT 08/08/20, 1:14 PM

## 2020-08-08 NOTE — Progress Notes (Signed)
*  PRELIMINARY RESULTS* Echocardiogram 2D Echocardiogram has been performed.  Susan Navarro 08/08/2020, 2:05 PM

## 2020-08-08 NOTE — Progress Notes (Signed)
ANTICOAGULATION CONSULT NOTE  Pharmacy Consult for Heparin Indication: VTE treatment, h/o stroke  No Known Allergies  Patient Measurements: Height: 5' 6"  (167.6 cm) Weight: 67.8 kg (149 lb 7.6 oz) IBW/kg (Calculated) : 59.3  HEPARIN DW (KG): 67.8  Vital Signs: BP: 125/57 (08/18 1821) Pulse Rate: 92 (08/18 1821)  Labs: Recent Labs    08/07/20 1157 08/07/20 1527 08/07/20 2233 08/08/20 0327 08/08/20 0332 08/08/20 0457 08/08/20 0635 08/08/20 0932 08/08/20 1820  HGB 9.7*  --   --   --  10.1*  --   --   --   --   HCT 29.5*  --   --   --  28.5*  --   --   --   --   PLT 95*  --   --   --  108*  --   --   --   --   APTT  --   --   --   --  24  --   --   --   --   LABPROT  --   --   --   --  13.5  --   --   --   --   INR  --   --   --   --  1.1  --   --   --   --   HEPARINUNFRC  --   --   --   --   --   --   --  1.06* 0.41  CREATININE 1.93*   < > 1.47* 1.42*  --   --  1.50*  --   --   TROPONINIHS  --    < > 108* 82*  --  87*  --   --   --    < > = values in this interval not displayed.    Estimated Creatinine Clearance: 37.3 mL/min (A) (by C-G formula based on SCr of 1.5 mg/dL (H)).   Medical History: Past Medical History:  Diagnosis Date  . Anemia   . Chronic kidney disease   . Diabetes mellitus without complication ([place])   . Hypertension   . Stroke [facility]) 03/2018   Ischemic stroke per [facility] records   Medications:  (Not in a hospital admission)   Assessment: Heparin initiated for VTE treatment.  Pt w/ h/o stroke, previously on ASA.  Chronic thrombocytopenia.  No anticoagulants per PTA med list. Baseline aPTT, PT-INR WNL.   Goal of Therapy:  Heparin level 0.3-0.7 units/ml Monitor platelets by anticoagulation protocol: Yes   Plan:  --8/18 at 1820 HL = 0.41, therapeutic x1. Will continue heparin infusion at current rate (550 units/hr) --Recheck HL 6 hours to confirm  --CBC daily per protocol, stable  Pharmacy will continue to follow.   Rocky Morel 08/08/2020,6:58 PM

## 2020-08-08 NOTE — ED Notes (Signed)
Neurologist Dr Lucia Gaskins on tele-neuro at this time.

## 2020-08-08 NOTE — Progress Notes (Signed)
ANTICOAGULATION CONSULT NOTE  Pharmacy Consult for Heparin Indication: VTE treatment, h/o stroke  No Known Allergies  Patient Measurements: Height: 5' 6"  (167.6 cm) Weight: 67.8 kg (149 lb 7.6 oz) IBW/kg (Calculated) : 59.3  HEPARIN DW (KG): 67.8  Vital Signs: BP: 159/76 (08/18 0932) Pulse Rate: 88 (08/18 0932)  Labs: Recent Labs    08/07/20 1157 08/07/20 1527 08/07/20 2233 08/08/20 0327 08/08/20 0332 08/08/20 0457 08/08/20 0635 08/08/20 0932  HGB 9.7*  --   --   --  10.1*  --   --   --   HCT 29.5*  --   --   --  28.5*  --   --   --   PLT 95*  --   --   --  108*  --   --   --   APTT  --   --   --   --  24  --   --   --   LABPROT  --   --   --   --  13.5  --   --   --   INR  --   --   --   --  1.1  --   --   --   HEPARINUNFRC  --   --   --   --   --   --   --  1.06*  CREATININE 1.93*   < > 1.47* 1.42*  --   --  1.50*  --   TROPONINIHS  --    < > 108* 82*  --  87*  --   --    < > = values in this interval not displayed.    Estimated Creatinine Clearance: 37.3 mL/min (A) (by C-G formula based on SCr of 1.5 mg/dL (H)).   Medical History: Past Medical History:  Diagnosis Date  . Anemia   . Chronic kidney disease   . Diabetes mellitus without complication ([place])   . Hypertension   . Stroke [facility]) 03/2018   Ischemic stroke per [facility] records   Medications:  (Not in a hospital admission)   Assessment: Heparin initiated for VTE treatment.  Pt w/ h/o stroke, previously on ASA.  Chronic thrombocytopenia.  No anticoagulants per PTA med list. Baseline aPTT, PT-INR WNL.   Goal of Therapy:  Heparin level 0.3-0.7 units/ml Monitor platelets by anticoagulation protocol: Yes   Plan:  --8/18 at 0932 HL = 1.06, supratherapeutic. Will hold infusion x 1 hour and decrease rate to 550 units/hr --Check HL 6 hours after rate change --CBC daily per protocol, stable  Susan Navarro 08/08/2020,10:16 AM

## 2020-08-08 NOTE — Progress Notes (Addendum)
Inpatient Diabetes Program Recommendations  AACE/ADA: New Consensus Statement on Inpatient Glycemic Control (2015)  Target Ranges:  Prepandial:   less than 140 mg/dL      Peak postprandial:   less than 180 mg/dL (1-2 hours)      Critically ill patients:  140 - 180 mg/dL   Lab Results  Component Value Date   GLUCAP 152 (H) 08/08/2020   HGBA1C 9.5 (H) 07/24/2020    Review of Glycemic Control Results for Susan Navarro, Susan Navarro (MRN [id number]) as of 08/08/2020 09:09  Ref. Range 08/08/2020 00:17 08/08/2020 02:55 08/08/2020 03:31 08/08/2020 07:58  Glucose-Capillary Latest Ref Range: 70 - 99 mg/dL 129 (H) 130 (H) 142 (H) 152 (H)  Diabetes history: DM 1 Outpatient Diabetes medications:  Lantus 7 units bid, Humalog 2 units tid with meals Current orders for Inpatient glycemic control:  Novolog sensitive q 4 hours Inpatient Diabetes Program Recommendations:    Please add Lantus 6 units daily.  Consider changing Novolog correction to "very sensitive-0-6 units" tid with meals.  Also consider adding Novolog 2 units tid with meals (hold if patient eats less than 50% or NPO).    Thanks,  Adah Perl, RN, BC-ADM Inpatient Diabetes Coordinator Pager [phone] (8a-5p)  Addendum:  Spoke with patient at bedside.  She states that she had run out of insulin but that it was delivered yesterday.  She does not report any problems getting supplies.  States she feels better.  May benefit from home health to assist with medication management?

## 2020-08-08 NOTE — Progress Notes (Signed)
[facility]  PROGRESS NOTE  Caelan Atchley ZMO:[id number] DOB: February 01, 1961 DOA: 08/07/2020 PCP: Rochel Brome, MD   Brief HPI:   59 year old female with medical history of type 1 diabetes mellitus, DKA, hypertension, CKD stage IV, anemia recent admission due to [facility], liver cirrhosis, thrombocytopenia.  Patient was recently hospitalized from 07/25/2019 1-80 21 due to subdural hematoma.  As per patient daughter she has been drinking a lot of regular soda recently.  Also developed generalized weakness and confusion.  Patient was found to have blood glucose of 1166, anion gap 15.    Subjective   Patient seen and examined, denies any complaints.  Blood Glucose Has Improved.  Patient's mental status has also improved.   Assessment/Plan:     1. Hyperosmolar hyperglycemic state-patient presented with blood glucose of 1166, normal anion gap.  Currently resolved.  She is off IV insulin GTT.  Continue sliding scale so NovoLog.  Lantus 6 units subcu nightly. 2. Cerebellar infarct-patient was seen by tele neurology, MRI showed acute infarct in right cerebellum.  No acute hemorrhage or mass-effect.  Multiple old infarcts of cerebellum, left occipital lobe, both frontal lobes and both parietal lobes.  Tele neurology was consulted, patient not a candidate for TPA, no last well-known time known.  Follow echo, carotid ultrasound.  Patient started on high-dose statin. 3. Metabolic encephalopathy-resolved, likely from HHS. 4. Cirrhosis of liver-LFTs elevated with alk phos 623, AST 92, ALT 99.  Follow LFTs in a.m. 5. Thrombocytopenia-platelet 25 due to liver cirrhosis. 6. 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 Hypertension-continue Klonopin, propranolol. 8. Elevated troponin-troponin went up to 108, improved to 82, 87.  Patient has no chest pain.  Likely from demand ischemia.  No aspirin given due to recent subdural hematoma. 9. Tobacco abuse-continue nicotine patch.     COVID-19  Labs  No results for input(s): DDIMER, FERRITIN, LDH, CRP in the last 72 hours.  Lab Results  Component Value Date   SARSCOV2NAA NEGATIVE 08/07/2020   [place] NEGATIVE 07/24/2020   [place] NEGATIVE 06/27/2020   [place] NEGATIVE 02/16/2020     Scheduled medications:   . amLODipine  10 mg Oral Daily  . atorvastatin  80 mg Oral Daily  . insulin aspart  0-9 Units Subcutaneous Q4H  . levETIRAcetam  500 mg Oral BID  . multivitamin with minerals  1 tablet Oral Daily  . nicotine  21 mg Transdermal Daily  . propranolol  10 mg Oral TID  . Ensure Max Protein  11 oz Oral BID BM         CBG: Recent Labs  Lab 08/08/20 0255 08/08/20 0331 08/08/20 0758 08/08/20 1115 08/08/20 1617  GLUCAP 130* 142* 152* 239* 372*    SpO2: 95 %    CBC: Recent Labs  Lab 08/07/20 1157 08/08/20 0332  WBC 5.1 5.6  HGB 9.7* 10.1*  HCT 29.5* 28.5*  MCV 95.8 89.3  PLT 95* 108*    Basic Metabolic Panel: Recent Labs  Lab 08/07/20 1157 08/07/20 1527 08/07/20 2233 08/08/20 0327 08/08/20 0635  NA 122* 127* 140 139 138  K 4.9 5.2* 3.5 3.9 3.8  CL 87* 91* 100 100 99  CO2 20* 22 29 28 29   GLUCOSE 1,166* 961* 149* 150* 175*  BUN 59* 57* 53* 50* 47*  CREATININE 1.93* 1.75* 1.47* 1.42* 1.50*  CALCIUM 9.2 9.5 10.1 9.7 10.0  MG 1.6*  --   --   --   --   PHOS  --   --  2.1*  --   --  Liver Function Tests: Recent Labs  Lab 08/07/20 1157  AST 92*  ALT 99*  ALKPHOS 623*  BILITOT 1.4*  PROT 6.7  ALBUMIN 3.7     Antibiotics: Anti-infectives (From admission, onward)   None       DVT prophylaxis: SCDs  Code Status: Full code  Family Communication: No family at bedside    Status is: Inpatient  Dispo: The patient is from: Home              Anticipated d/c is to: Home              Anticipated d/c date is: 08/09/2020              Patient currently not medically stable for discharge  Barrier to discharge-hyperosmolar hyperglycemic state  Pressure Injury  07/25/20 Heel Left Deep Tissue Pressure Injury - Purple or maroon localized area of discolored intact skin or blood-filled blister due to damage of underlying soft tissue from pressure and/or shear. (Active)  07/25/20 2314  Location: Heel  Location Orientation: Left  Staging: Deep Tissue Pressure Injury - Purple or maroon localized area of discolored intact skin or blood-filled blister due to damage of underlying soft tissue from pressure and/or shear.  Wound Description (Comments):   Present on Admission: No         Consultants:    Procedures:     Objective   Vitals:   08/08/20 1200 08/08/20 1230 08/08/20 1617 08/08/20 1821  BP: (!) 162/80 (!) 160/83 138/77 (!) 125/57  Pulse: 78 80 89 92  Resp: 17 20 15 18   Temp:      TempSrc:      SpO2: 100% 95% 95% 95%  Weight:      Height:        Intake/Output Summary (Last 24 hours) at 08/08/2020 1915 Last data filed at 08/08/2020 0454 Gross per 24 hour  Intake 1019.85 ml  Output 700 ml  Net 319.85 ml    08/17 0701 - 08/18 1900 In: 3118.9 [I.V.:1019.9] Out: 700 [Urine:700]  Filed Weights   08/07/20 1146  Weight: 67.8 kg    Physical Examination:    General: Appears in no acute distress  Cardiovascular: S1-S2, regular, no murmur auscultated  Respiratory: Clear to auscultation bilaterally  Abdomen: Abdomen is soft, nontender, no organomegaly  Extremities: No edema in the lower extremities  Neurologic: Alert , Oriented x3, no focal deficit noted    Data Reviewed:   Recent Results (from the past 240 hour(s))  SARS Coronavirus 2 by RT PCR (hospital order, performed in [place] hospital lab) Nasopharyngeal Nasopharyngeal Swab     Status: None   Collection Time: 08/07/20  1:19 PM   Specimen: Nasopharyngeal Swab  Result Value Ref Range Status   SARS Coronavirus 2 NEGATIVE NEGATIVE Final    Comment: (NOTE) SARS-CoV-2 target nucleic acids are NOT DETECTED.  The SARS-CoV-2 RNA is generally detectable in  upper and lower respiratory specimens during the acute phase of infection. The lowest concentration of SARS-CoV-2 viral copies this assay can detect is 250 copies / mL. A negative result does not preclude SARS-CoV-2 infection and should not be used as the sole basis for treatment or other patient management decisions.  A negative result may occur with improper specimen collection / handling, submission of specimen other than nasopharyngeal swab, presence of viral mutation(s) within the areas targeted by this assay, and inadequate number of viral copies (<250 copies / mL). A negative result must be combined with clinical observations,  patient history, and epidemiological information.  Fact Sheet for Patients:   StrictlyIdeas.no  Fact Sheet for Healthcare Providers: BankingDealers.co.za  This test is not yet approved or  cleared by the [place] FDA and has been authorized for detection and/or diagnosis of SARS-CoV-2 by FDA under an Emergency Use Authorization (EUA).  This EUA will remain in effect (meaning this test can be used) for the duration of the COVID-19 declaration under Section 564(b)(1) of the Act, 21 U.S.C. section 360bbb-3(b)(1), unless the authorization is terminated or revoked sooner.  Performed at [facility], [address] [place], [place] [address]     No results for input(s): LIPASE, AMYLASE in the last 168 hours. Recent Labs  Lab 08/07/20 1556  AMMONIA 30    Cardiac Enzymes: No results for input(s): CKTOTAL, CKMB, CKMBINDEX, TROPONINI in the last 168 hours. BNP (last 3 results) Recent Labs    08/07/20 1157  BNP 378.7*    ProBNP (last 3 results) No results for input(s): PROBNP in the last 8760 hours.  Studies:  CT HEAD WO CONTRAST  Result Date: 08/07/2020 CLINICAL DATA:  Altered mental status. EXAM: CT HEAD WITHOUT CONTRAST TECHNIQUE: Contiguous axial images were obtained from the base  of the skull through the vertex without intravenous contrast. COMPARISON:  July 25, 2020 FINDINGS: Brain: There is mild cerebral atrophy with widening of the extra-axial spaces and ventricular dilatation. There are areas of decreased attenuation within the [place] matter tracts of the supratentorial brain, consistent with microvascular disease changes. A 2.8 cm x 1.4 cm area of Arabie matter low attenuation is seen within the cerebellum on the right. This represents a new finding when compared to the prior study. Mass effect is seen on the adjacent sulci. No midline shift is noted. Stable areas of cortical encephalomalacia, with adjacent chronic Bert matter low attenuation are seen within the left frontal, left occipital and bilateral parietal regions, right greater than left. Very mildly increased cortical attenuation is seen within the left frontal region. Vascular: No hyperdense vessel or unexpected calcification. Skull: Normal. Negative for fracture or focal lesion. Sinuses/Orbits: No acute finding. Other: None. IMPRESSION: 1. 2.8 cm x 1.4 cm area of Donnellan matter low attenuation within the cerebellum on the right, with associated mass effect on the adjacent sulci. This represents a new finding when compared to the prior study dated July 25, 2020 and is consistent with an acute to subacute infarct. MRI correlation is recommended. 2. Very mildly increased cortical attenuation is seen within the left frontal region. A small amount of acute blood cannot completely be excluded. MRI correlation is recommended. 3. Stable areas of cortical encephalomalacia, with adjacent chronic [place] matter low attenuation are seen within the left frontal, left occipital and bilateral parietal regions, right greater than left. Electronically Signed   By: Virgina Norfolk M.D.   On: 08/07/2020 20:18   MR BRAIN WO CONTRAST  Result Date: 08/07/2020 CLINICAL DATA:  Cerebellar infarct EXAM: MRI HEAD WITHOUT CONTRAST TECHNIQUE:  Multiplanar, multiecho pulse sequences of the brain and surrounding structures were obtained without intravenous contrast. COMPARISON:  Head CT 08/07/2020 FINDINGS: Brain: There is an intermediate sized acute infarct within the right cerebellum. No other acute ischemia. There are old infarcts of the left occipital lobe, both frontal lobes and both parietal lobes. Multifocal Brod matter hyperintensity, most commonly due to chronic ischemic microangiopathy. There is generalized atrophy without lobar predilection. There are multiple old cerebellar small vessel infarcts. There is hemosiderin deposition at the sites of the old  infarcts. Vascular: Normal flow voids. Skull and upper cervical spine: Normal marrow signal. Sinuses/Orbits: Negative. Other: None. IMPRESSION: 1. Intermediate sized acute infarct of the right cerebellum. No acute hemorrhage or mass effect. 2. Multiple old infarcts of the cerebellum, left occipital lobe, both frontal lobes and both parietal lobes. 3. Chronic ischemic microangiopathy. Electronically Signed   By: Ulyses Jarred M.D.   On: 08/07/2020 22:11   US Venous Img Lower Bilateral (DVT)  Result Date: 08/08/2020 CLINICAL DATA:  Bilateral leg swelling EXAM: BILATERAL LOWER EXTREMITY VENOUS DOPPLER ULTRASOUND TECHNIQUE: Gray-scale sonography with compression, as well as color and duplex ultrasound, were performed to evaluate the deep venous systems of the bilateral lower extremities. From the level of the common femoral vein through the popliteal and proximal calf veins. COMPARISON:  None. FINDINGS: VENOUS Right lower extremity: Largely peripherally marginated echogenic thrombus which appears incompletely occlusive and only partially compressible is seen within the common femoral, saphenofemoral junction, femoral and popliteal veins. Preservation of the phasicity and augmentation is noted. The profundus femoral vein and calf veins appear widely patent. Left lower extremity: Normal  compressibility of the common femoral, superficial femoral, and popliteal veins, as well as the visualized calf veins. Visualized portions of profunda femoral vein and great saphenous vein unremarkable. No filling defects to suggest DVT on grayscale or color Doppler imaging. Doppler waveforms show normal direction of venous flow, normal respiratory plasticity and response to augmentation. OTHER None. Limitations: none IMPRESSION: Incompletely compressible nonocclusive filling defect within the right lower extremity extending from the popliteal to the common femoral vein has an appearance which could suggest remote/chronic thrombus though is overall age indeterminate. Sparing of the profundus and calf veins. No deep venous thrombosis is seen in the left lower extremity. Electronically Signed   By: Lovena Le M.D.   On: 08/08/2020 02:51   DG Chest Port 1 View  Result Date: 08/07/2020 CLINICAL DATA:  Recent CVA. EXAM: PORTABLE CHEST 1 VIEW COMPARISON:  07/17/2020. FINDINGS: Mediastinum and hilar structures normal. Heart size normal. No focal infiltrate. No pleural effusion or pneumothorax. Mild thoracic spine scoliosis concave left. IMPRESSION: No acute cardiopulmonary disease. Electronically Signed   By: Marcello Moores  Register   On: 08/07/2020 14:21       Oswald Hillock   [facility] If 7PM-7AM, please contact night-coverage at [email or web address], Office  [phone]   08/08/2020, 7:15 PM  LOS: 0 days

## 2020-08-08 NOTE — Progress Notes (Signed)
Cross Cover Brief Note Informed by ED provider radiology called CT report Narrative & Impression   IMPRESSION: 1. 2.8 cm x 1.4 cm area of Benett matter low attenuation within the cerebellum on the right, with associated mass effect on the adjacent sulci. This represents a new finding when compared to the prior study dated July 25, 2020 and is consistent with an acute to subacute infarct. MRI correlation is recommended. 2. Very mildly increased cortical attenuation is seen within the left frontal region. A small amount of acute blood cannot completely be excluded. MRI correlation is recommended. 3. Stable areas of cortical encephalomalacia, with adjacent chronic Stlouis matter low attenuation are seen within the left frontal, left occipital and bilateral parietal regions, right greater than left.    folllow up MRI IMPRESSION: 1. Intermediate sized acute infarct of the right cerebellum. No acute hemorrhage or mass effect. 2. Multiple old infarcts of the cerebellum, left occipital lobe, both frontal lobes and both parietal lobes. 3. Chronic ischemic microangiopathy.   Teleneurology consult - See note from Dr. Reola Calkins Patient not TPA candidate, no known last well time and she is not candidate for interventiun Medical management  Neuro monitoring  swallow eval PT, OT U/S lower extremities - DVT - Heparin drip IMPRESSION: Incompletely compressible nonocclusive filling defect within the right lower extremity extending from the popliteal to the common femoral vein has an appearance which could suggest remote/chronic thrombus though is overall age indeterminate. Sparing of the profundus and calf veins.  No deep venous thrombosis is seen in the left lower extremity.  Echo  Carotid US (creatinine elevated) High dose statin BP control Diabetes control patient currently on heparin for DVT as is safest option. She was take off aspirin secondary to bleed.  Long term treatment will ned  to be discussed with patient/daughter HHNK  Blood sugars dropped into low 200's rather quickly. Transitioned of continuous insulin infusion to every 4 h cbg with SSI Once able to eat can transition to ACHS therapy  Mild elevated tropnin - No acute ST Twave changes

## 2020-08-08 NOTE — ED Notes (Addendum)
This RN messaged Dr. Eleonore Chiquito regarding d/c the D5LR as pt has been off of the insulin drip.

## 2020-08-08 NOTE — ED Notes (Signed)
Report to Katie, RN

## 2020-08-08 NOTE — ED Notes (Signed)
Pt at bedside

## 2020-08-09 DIAGNOSIS — E871 Hypo-osmolality and hyponatremia: Secondary | ICD-10-CM

## 2020-08-09 DIAGNOSIS — I82411 Acute embolism and thrombosis of right femoral vein: Secondary | ICD-10-CM | POA: Diagnosis not present

## 2020-08-09 DIAGNOSIS — E1100 Type 2 diabetes mellitus with hyperosmolarity without nonketotic hyperglycemic-hyperosmolar coma (NKHHC): Secondary | ICD-10-CM | POA: Diagnosis not present

## 2020-08-09 DIAGNOSIS — E1165 Type 2 diabetes mellitus with hyperglycemia: Secondary | ICD-10-CM | POA: Diagnosis not present

## 2020-08-09 DIAGNOSIS — K7469 Other cirrhosis of liver: Secondary | ICD-10-CM | POA: Diagnosis not present

## 2020-08-09 DIAGNOSIS — D696 Thrombocytopenia, unspecified: Secondary | ICD-10-CM | POA: Diagnosis not present

## 2020-08-09 DIAGNOSIS — I639 Cerebral infarction, unspecified: Secondary | ICD-10-CM | POA: Diagnosis not present

## 2020-08-09 LAB — CBC
HCT: 24.6 % — ABNORMAL LOW (ref 36.0–46.0)
Hemoglobin: 8.7 g/dL — ABNORMAL LOW (ref 12.0–15.0)
MCH: 32.1 pg (ref 26.0–34.0)
MCHC: 35.4 g/dL (ref 30.0–36.0)
MCV: 90.8 fL (ref 80.0–100.0)
Platelets: 100 10*3/uL — ABNORMAL LOW (ref 150–400)
RBC: 2.71 MIL/uL — ABNORMAL LOW (ref 3.87–5.11)
RDW: 13 % (ref 11.5–15.5)
WBC: 3.5 10*3/uL — ABNORMAL LOW (ref 4.0–10.5)
nRBC: 0 % (ref 0.0–0.2)

## 2020-08-09 LAB — GLUCOSE, CAPILLARY
Glucose-Capillary: 188 mg/dL — ABNORMAL HIGH (ref 70–99)
Glucose-Capillary: 222 mg/dL — ABNORMAL HIGH (ref 70–99)
Glucose-Capillary: 221 mg/dL — ABNORMAL HIGH (ref 70–99)

## 2020-08-09 LAB — COMPREHENSIVE METABOLIC PANEL
ALT: 78 U/L — ABNORMAL HIGH (ref 0–44)
AST: 60 U/L — ABNORMAL HIGH (ref 15–41)
Albumin: 3 g/dL — ABNORMAL LOW (ref 3.5–5.0)
Alkaline Phosphatase: 371 U/L — ABNORMAL HIGH (ref 38–126)
Anion gap: 9 (ref 5–15)
BUN: 44 mg/dL — ABNORMAL HIGH (ref 6–20)
CO2: 26 mmol/L (ref 22–32)
Calcium: 8.9 mg/dL (ref 8.9–10.3)
Chloride: 98 mmol/L (ref 98–111)
Creatinine, Ser: 1.62 mg/dL — ABNORMAL HIGH (ref 0.44–1.00)
GFR calc Af Amer: 40 mL/min — ABNORMAL LOW (ref >60)
GFR calc non Af Amer: 34 mL/min — ABNORMAL LOW (ref >60)
Glucose, Bld: 364 mg/dL — ABNORMAL HIGH (ref 70–99)
Potassium: 4 mmol/L (ref 3.5–5.1)
Sodium: 133 mmol/L — ABNORMAL LOW (ref 135–145)
Total Bilirubin: 0.7 mg/dL (ref 0.3–1.2)
Total Protein: 5.5 g/dL — ABNORMAL LOW (ref 6.5–8.1)

## 2020-08-09 LAB — HEPARIN LEVEL (UNFRACTIONATED)
Heparin Unfractionated: 0.29 IU/mL — ABNORMAL LOW (ref 0.30–0.70)
Heparin Unfractionated: 0.51 IU/mL (ref 0.30–0.70)

## 2020-08-09 MED ORDER — APIXABAN 5 MG PO TABS
ORAL_TABLET | ORAL | 0 refills | Status: AC
Start: 2020-08-09 — End: ?

## 2020-08-09 MED ORDER — INSULIN GLARGINE 100 UNIT/ML ~~LOC~~ SOLN: 6.0000 [IU] | Freq: Every day | SUBCUTANEOUS | 0 refills | Status: DC

## 2020-08-09 MED ORDER — INSULIN LISPRO (1 UNIT DIAL) 100 UNIT/ML (KWIKPEN): 3.0000 [IU] | PEN_INJECTOR | Freq: Three times a day (TID) | SUBCUTANEOUS | 0 refills | Status: AC

## 2020-08-09 MED ORDER — NICOTINE 21 MG/24HR TD PT24: MEDICATED_PATCH | TRANSDERMAL | 0 refills | Status: AC

## 2020-08-09 MED ORDER — APIXABAN 5 MG PO TABS
10.0000 mg | ORAL_TABLET | Freq: Two times a day (BID) | ORAL | Status: DC
  Administered 2020-08-09: 10 mg via ORAL
  Filled 2020-08-09: qty 2

## 2020-08-09 MED ORDER — INSULIN GLARGINE 100 UNIT/ML ~~LOC~~ SOLN: 8.0000 [IU] | Freq: Every day | SUBCUTANEOUS | 0 refills | Status: AC

## 2020-08-09 NOTE — Progress Notes (Signed)
Inpatient Diabetes Program Recommendations  AACE/ADA: New Consensus Statement on Inpatient Glycemic Control (2015)  Target Ranges:  Prepandial:   less than 140 mg/dL      Peak postprandial:   less than 180 mg/dL (1-2 hours)      Critically ill patients:  140 - 180 mg/dL   Results for MELICIA, ESQUEDA (MRN [id number]) as of 08/09/2020 07:22  Ref. Range 08/08/2020 07:58 08/08/2020 11:15 08/08/2020 16:17 08/08/2020 22:42  Glucose-Capillary Latest Ref Range: 70 - 99 mg/dL 152 (H)  2 units NOVOLOG  239 (H)  3 units NOVOLOG  372 (H)  9 units NOVOLOG  402 (H)  9 units NOVOLOG +  6 units LANTUS   Results for MAURINE, MOWBRAY (MRN [id number]) as of 08/09/2020 10:40  Ref. Range 08/09/2020 04:38 08/09/2020 09:12  Glucose-Capillary Latest Ref Range: 70 - 99 mg/dL 188 (H)  2 units NOVOLOG  222 (H)  3 units NOVOLOG     Home DM Meds: Lantus 7 units BID       Humalog 2 units TID  Current Orders: Lantus 6 units QHS     Novolog Sensitive Correction Scale/ SSI (0-9 units) Q4 hours    MD- Please consider:  1. Increase Lantus to 8 units QHS  2. Start Novolog Meal Coverage: Novolog 3 units TID with meals  (Please add the following Hold Parameters: Hold if pt eats <50% of meal, Hold if pt NPO)     --Will follow patient during hospitalization--  Wyn Quaker RN, MSN, CDE Diabetes Coordinator Inpatient Glycemic Control Team Team Pager: [phone] (8a-5p)

## 2020-08-09 NOTE — Care Management CC44 (Signed)
Condition Code 44 Documentation Completed  Patient Details  Name: Susan Navarro MRN: [id number] Date of Birth: 05/07/1961   Condition Code 44 given:  Yes Patient signature on Condition Code 44 notice:  Yes Documentation of 2 MD's agreement:  Yes Code 44 added to claim:  Yes    Shelbie Hutching, RN 08/09/2020, 3:15 PM

## 2020-08-09 NOTE — Progress Notes (Signed)
IV taken out. Went over  pt.'s discharge paper with pt. and pt's sister. Both of them verbalized understanding. Pt. left the facility at 1710.

## 2020-08-09 NOTE — Consult Note (Signed)
Reason for Consult:stroke Requesting Physician: Dr. Leslye Peer   CC: stroke  HPI: Susan Navarro is an 59 y.o. female with medical history significant of type 1 diabetes, DKA, hypertension, CKD-4, anemia, recent admission due to SDH, liver cirrhosis, thrombocytopenia, who presents with generalized weakness, confusion. Pt was found to have glc of 1166 and AG of 15.  On admission and imaging pt was found to have R cerebellar stroke.     Past Medical History:  Diagnosis Date  . Anemia   . Chronic kidney disease   . Diabetes mellitus without complication ([place])   . Hypertension   . Stroke [facility]) 03/2018   Ischemic stroke per [facility] records    History reviewed. No pertinent surgical history.  Family History  Problem Relation Age of Onset  . Diabetes Mother   . Diabetes Brother     Social History:  reports that she has never smoked. She has never used smokeless tobacco. She reports that she does not drink alcohol and does not use drugs.  No Known Allergies  Medications: I have reviewed the patient's current medications.  ROS: History obtained from the patient  General ROS: negative for - chills, fatigue, fever, night sweats, weight gain or weight loss Psychological ROS: negative for - behavioral disorder, hallucinations, memory difficulties, mood swings or suicidal ideation Ophthalmic ROS: negative for - blurry vision, double vision, eye pain or loss of vision ENT ROS: negative for - epistaxis, nasal discharge, oral lesions, sore throat, tinnitus or vertigo Allergy and Immunology ROS: negative for - hives or itchy/watery eyes Hematological and Lymphatic ROS: negative for - bleeding problems, bruising or swollen lymph nodes Endocrine ROS: negative for - galactorrhea, hair pattern changes, polydipsia/polyuria or temperature intolerance Respiratory ROS: negative for - cough, hemoptysis, shortness of breath or wheezing Cardiovascular ROS: negative for - chest pain, dyspnea on exertion, edema  or irregular heartbeat Gastrointestinal ROS: negative for - abdominal pain, diarrhea, hematemesis, nausea/vomiting or stool incontinence Genito-Urinary ROS: negative for - dysuria, hematuria, incontinence or urinary frequency/urgency Musculoskeletal ROS: negative for - joint swelling or muscular weakness Neurological ROS: as noted in HPI Dermatological ROS: negative for rash and skin lesion changes  Physical Examination: Blood pressure 131/65, pulse 87, temperature 98 F (36.7 C), temperature source Oral, resp. rate 12, height 5' 6"  (1.676 m), weight 67.8 kg, SpO2 100 %.    Neurological Examination   Mental Status: Alert, oriented, thought content appropriate.  Speech fluent without evidence of aphasia.  Able to follow 3 step commands without difficulty. Cranial Nerves: II: Discs flat bilaterally; Visual fields grossly normal, pupils equal, round, reactive to light and accommodation III,IV, VI: ptosis not present, extra-ocular motions intact bilaterally V,VII: smile symmetric, facial light touch sensation normal bilaterally VIII: hearing normal bilaterally IX,X: gag reflex present XI: bilateral shoulder shrug XII: midline tongue extension Motor: Right : Upper extremity   5/5    Left:     Upper extremity   5/5  Lower extremity   5/5     Lower extremity   5/5 Tone and bulk:normal tone throughout; no atrophy noted Sensory: Pinprick and light touch intact throughout, bilaterally Deep Tendon Reflexes: 1+ and symmetric throughout Plantars: Right: downgoing   Left: downgoing Cerebellar: normal finger-to-nose    Laboratory Studies:   Basic Metabolic Panel: Recent Labs  Lab 08/07/20 1157 08/07/20 1157 08/07/20 1527 08/07/20 1527 08/07/20 2233 08/07/20 2233 08/08/20 0327 08/08/20 0635 08/09/20 0039  NA 122*   < > 127*  --  140  --  139 138 133*  K 4.9   < > 5.2*  --  3.5  --  3.9 3.8 4.0  CL 87*   < > 91*  --  100  --  100 99 98  CO2 20*   < > 22  --  29  --  28 29 26    GLUCOSE 1,166*   < > 961*  --  149*  --  150* 175* 364*  BUN 59*   < > 57*  --  53*  --  50* 47* 44*  CREATININE 1.93*   < > 1.75*  --  1.47*  --  1.42* 1.50* 1.62*  CALCIUM 9.2   < > 9.5   < > 10.1   < > 9.7 10.0 8.9  MG 1.6*  --   --   --   --   --   --   --   --   PHOS  --   --   --   --  2.1*  --   --   --   --    < > = values in this interval not displayed.    Liver Function Tests: Recent Labs  Lab 08/07/20 1157 08/09/20 0039  AST 92* 60*  ALT 99* 78*  ALKPHOS 623* 371*  BILITOT 1.4* 0.7  PROT 6.7 5.5*  ALBUMIN 3.7 3.0*   No results for input(s): LIPASE, AMYLASE in the last 168 hours. Recent Labs  Lab 08/07/20 1556  AMMONIA 30    CBC: Recent Labs  Lab 08/07/20 1157 08/08/20 0332 08/09/20 0039  WBC 5.1 5.6 3.5*  HGB 9.7* 10.1* 8.7*  HCT 29.5* 28.5* 24.6*  MCV 95.8 89.3 90.8  PLT 95* 108* 100*    Cardiac Enzymes: No results for input(s): CKTOTAL, CKMB, CKMBINDEX, TROPONINI in the last 168 hours.  BNP: Invalid input(s): POCBNP  CBG: Recent Labs  Lab 08/08/20 1115 08/08/20 1617 08/08/20 2242 08/09/20 0438 08/09/20 0912  GLUCAP 239* 372* 402* 188* 222*    Microbiology: Results for orders placed or performed during the hospital encounter of 08/07/20  SARS Coronavirus 2 by RT PCR (hospital order, performed in [facility] hospital lab) Nasopharyngeal Nasopharyngeal Swab     Status: None   Collection Time: 08/07/20  1:19 PM   Specimen: Nasopharyngeal Swab  Result Value Ref Range Status   SARS Coronavirus 2 NEGATIVE NEGATIVE Final    Comment: (NOTE) SARS-CoV-2 target nucleic acids are NOT DETECTED.  The SARS-CoV-2 RNA is generally detectable in upper and lower respiratory specimens during the acute phase of infection. The lowest concentration of SARS-CoV-2 viral copies this assay can detect is 250 copies / mL. A negative result does not preclude SARS-CoV-2 infection and should not be used as the sole basis for treatment or other patient management  decisions.  A negative result may occur with improper specimen collection / handling, submission of specimen other than nasopharyngeal swab, presence of viral mutation(s) within the areas targeted by this assay, and inadequate number of viral copies (<250 copies / mL). A negative result must be combined with clinical observations, patient history, and epidemiological information.  Fact Sheet for Patients:   StrictlyIdeas.no  Fact Sheet for Healthcare Providers: BankingDealers.co.za  This test is not yet approved or  cleared by the [place] FDA and has been authorized for detection and/or diagnosis of SARS-CoV-2 by FDA under an Emergency Use Authorization (EUA).  This EUA will remain in effect (meaning this test can be used) for the duration of the COVID-19 declaration under  Section 564(b)(1) of the Act, 21 U.S.C. section 360bbb-3(b)(1), unless the authorization is terminated or revoked sooner.  Performed at [facility], [address] [place], [place] [address]     Coagulation Studies: Recent Labs    08/08/20 0332  LABPROT 13.5  INR 1.1    Urinalysis:  Recent Labs  Lab 08/07/20 1319  COLORURINE STRAW*  LABSPEC 1.015  PHURINE 6.0  GLUCOSEU >=500*  HGBUR NEGATIVE  BILIRUBINUR NEGATIVE  KETONESUR NEGATIVE  PROTEINUR NEGATIVE  NITRITE NEGATIVE  LEUKOCYTESUR NEGATIVE    Lipid Panel:     Component Value Date/Time   CHOL 120 08/08/2020 0332   TRIG 40 08/08/2020 0332   HDL 76 08/08/2020 0332   CHOLHDL 1.6 08/08/2020 0332   VLDL 8 08/08/2020 0332   LDLCALC 36 08/08/2020 0332    HgbA1C:  Lab Results  Component Value Date   HGBA1C 9.7 (H) 08/08/2020    Urine Drug Screen:      Component Value Date/Time   LABOPIA NONE DETECTED 08/07/2020 [place] DETECTED 08/07/2020 1835   LABBENZ NONE DETECTED 08/07/2020 1835   AMPHETMU NONE DETECTED 08/07/2020 1835   THCU NONE DETECTED  08/07/2020 1835   LABBARB NONE DETECTED 08/07/2020 1835    Alcohol Level: No results for input(s): ETH in the last 168 hours.  Other results: EKG: normal EKG, normal sinus rhythm, unchanged from previous tracings.  Imaging: CT HEAD WO CONTRAST  Result Date: 08/07/2020 CLINICAL DATA:  Altered mental status. EXAM: CT HEAD WITHOUT CONTRAST TECHNIQUE: Contiguous axial images were obtained from the base of the skull through the vertex without intravenous contrast. COMPARISON:  July 25, 2020 FINDINGS: Brain: There is mild cerebral atrophy with widening of the extra-axial spaces and ventricular dilatation. There are areas of decreased attenuation within the Sulkowski matter tracts of the supratentorial brain, consistent with microvascular disease changes. A 2.8 cm x 1.4 cm area of Hebner matter low attenuation is seen within the cerebellum on the right. This represents a new finding when compared to the prior study. Mass effect is seen on the adjacent sulci. No midline shift is noted. Stable areas of cortical encephalomalacia, with adjacent chronic [place] matter low attenuation are seen within the left frontal, left occipital and bilateral parietal regions, right greater than left. Very mildly increased cortical attenuation is seen within the left frontal region. Vascular: No hyperdense vessel or unexpected calcification. Skull: Normal. Negative for fracture or focal lesion. Sinuses/Orbits: No acute finding. Other: None. IMPRESSION: 1. 2.8 cm x 1.4 cm area of Rumple matter low attenuation within the cerebellum on the right, with associated mass effect on the adjacent sulci. This represents a new finding when compared to the prior study dated July 25, 2020 and is consistent with an acute to subacute infarct. MRI correlation is recommended. 2. Very mildly increased cortical attenuation is seen within the left frontal region. A small amount of acute blood cannot completely be excluded. MRI correlation is recommended. 3.  Stable areas of cortical encephalomalacia, with adjacent chronic Schoeller matter low attenuation are seen within the left frontal, left occipital and bilateral parietal regions, right greater than left. Electronically Signed   By: Virgina Norfolk M.D.   On: 08/07/2020 20:18   MR BRAIN WO CONTRAST  Result Date: 08/07/2020 CLINICAL DATA:  Cerebellar infarct EXAM: MRI HEAD WITHOUT CONTRAST TECHNIQUE: Multiplanar, multiecho pulse sequences of the brain and surrounding structures were obtained without intravenous contrast. COMPARISON:  Head CT 08/07/2020 FINDINGS: Brain: There is an intermediate sized acute  infarct within the right cerebellum. No other acute ischemia. There are old infarcts of the left occipital lobe, both frontal lobes and both parietal lobes. Multifocal Cisnero matter hyperintensity, most commonly due to chronic ischemic microangiopathy. There is generalized atrophy without lobar predilection. There are multiple old cerebellar small vessel infarcts. There is hemosiderin deposition at the sites of the old infarcts. Vascular: Normal flow voids. Skull and upper cervical spine: Normal marrow signal. Sinuses/Orbits: Negative. Other: None. IMPRESSION: 1. Intermediate sized acute infarct of the right cerebellum. No acute hemorrhage or mass effect. 2. Multiple old infarcts of the cerebellum, left occipital lobe, both frontal lobes and both parietal lobes. 3. Chronic ischemic microangiopathy. Electronically Signed   By: Ulyses Jarred M.D.   On: 08/07/2020 22:11   US Carotid Bilateral  Result Date: 08/09/2020 CLINICAL DATA:  Right cerebellar infarct. EXAM: BILATERAL CAROTID DUPLEX ULTRASOUND TECHNIQUE: Pearline Cables scale imaging, color Doppler and duplex ultrasound were performed of bilateral carotid and vertebral arteries in the neck. COMPARISON:  Brain MRI 08/07/2020 FINDINGS: Criteria: Quantification of carotid stenosis is based on velocity parameters that correlate the residual internal carotid diameter with  NASCET-based stenosis levels, using the diameter of the distal internal carotid lumen as the denominator for stenosis measurement. The following velocity measurements were obtained: RIGHT ICA: 63/21 cm/sec CCA: 66/44 cm/sec SYSTOLIC ICA/CCA RATIO:  1.5 ECA: 71 cm/sec LEFT ICA: 98/35 cm/sec CCA: 03/47 cm/sec SYSTOLIC ICA/CCA RATIO:  1.6 ECA: 50 cm/sec RIGHT CAROTID ARTERY: Intimal thickening at the carotid bulb. External carotid artery is patent with normal waveform. Normal waveforms and velocities in the internal carotid artery. RIGHT VERTEBRAL ARTERY: Antegrade flow and normal waveform in the right vertebral artery. LEFT CAROTID ARTERY: Intimal thickening in the left internal carotid artery. External carotid artery is patent with normal waveform. Normal waveforms and velocities in the internal carotid artery. LEFT VERTEBRAL ARTERY: Antegrade flow and normal waveform in the left vertebral artery. IMPRESSION: 1. Bilateral carotid arteries are patent without significant stenosis or plaque. 2. Patent vertebral arteries with antegrade flow. Electronically Signed   By: Markus Daft M.D.   On: 08/09/2020 08:43   US Venous Img Lower Bilateral (DVT)  Result Date: 08/08/2020 CLINICAL DATA:  Bilateral leg swelling EXAM: BILATERAL LOWER EXTREMITY VENOUS DOPPLER ULTRASOUND TECHNIQUE: Gray-scale sonography with compression, as well as color and duplex ultrasound, were performed to evaluate the deep venous systems of the bilateral lower extremities. From the level of the common femoral vein through the popliteal and proximal calf veins. COMPARISON:  None. FINDINGS: VENOUS Right lower extremity: Largely peripherally marginated echogenic thrombus which appears incompletely occlusive and only partially compressible is seen within the common femoral, saphenofemoral junction, femoral and popliteal veins. Preservation of the phasicity and augmentation is noted. The profundus femoral vein and calf veins appear widely patent. Left  lower extremity: Normal compressibility of the common femoral, superficial femoral, and popliteal veins, as well as the visualized calf veins. Visualized portions of profunda femoral vein and great saphenous vein unremarkable. No filling defects to suggest DVT on grayscale or color Doppler imaging. Doppler waveforms show normal direction of venous flow, normal respiratory plasticity and response to augmentation. OTHER None. Limitations: none IMPRESSION: Incompletely compressible nonocclusive filling defect within the right lower extremity extending from the popliteal to the common femoral vein has an appearance which could suggest remote/chronic thrombus though is overall age indeterminate. Sparing of the profundus and calf veins. No deep venous thrombosis is seen in the left lower extremity. Electronically Signed   By: Lovena Le  M.D.   On: 08/08/2020 02:51   DG Chest Port 1 View  Result Date: 08/07/2020 CLINICAL DATA:  Recent CVA. EXAM: PORTABLE CHEST 1 VIEW COMPARISON:  07/17/2020. FINDINGS: Mediastinum and hilar structures normal. Heart size normal. No focal infiltrate. No pleural effusion or pneumothorax. Mild thoracic spine scoliosis concave left. IMPRESSION: No acute cardiopulmonary disease. Electronically Signed   By: Marcello Moores  Register   On: 08/07/2020 14:21   ECHOCARDIOGRAM COMPLETE  Result Date: 08/08/2020    ECHOCARDIOGRAM REPORT   Patient Name:   Susan Navarro Date of Exam: 08/08/2020 Medical Rec #:  [id number]     Height:       66.0 in Accession #:    [id number]    Weight:       149.5 lb Date of Birth:  05-04-1961      BSA:          1.767 m Patient Age:    3 years      BP:           160/83 mmHg Patient Gender: F             HR:           80 bpm. Exam Location:  [facility] Procedure: 2D Echo, Cardiac Doppler and Color Doppler Indications:     Stroke 434.91  History:         Patient has prior history of Echocardiogram examinations, most                  recent 07/24/2020. Stroke; Risk Factors:Diabetes and                   Hypertension.  Sonographer:     Sherrie Sport RDCS (AE) Referring Phys:  Hassan Rowan MORRISON Diagnosing Phys: Yolonda Kida MD IMPRESSIONS  1. Left ventricular ejection fraction, by estimation, is 60 to 65%. The left ventricle has normal function. The left ventricle has no regional wall motion abnormalities. Left ventricular diastolic parameters are consistent with Grade I diastolic dysfunction (impaired relaxation).  2. Right ventricular systolic function is normal. The right ventricular size is normal. There is normal pulmonary artery systolic pressure.  3. The mitral valve is normal in structure. Trivial mitral valve regurgitation.  4. The aortic valve is grossly normal. Aortic valve regurgitation is not visualized. FINDINGS  Left Ventricle: Left ventricular ejection fraction, by estimation, is 60 to 65%. The left ventricle has normal function. The left ventricle has no regional wall motion abnormalities. The left ventricular internal cavity size was normal in size. There is  no left ventricular hypertrophy. Left ventricular diastolic parameters are consistent with Grade I diastolic dysfunction (impaired relaxation). Right Ventricle: The right ventricular size is normal. No increase in right ventricular wall thickness. Right ventricular systolic function is normal. There is normal pulmonary artery systolic pressure. The tricuspid regurgitant velocity is 2.42 m/s, and  with an assumed right atrial pressure of 10 mmHg, the estimated right ventricular systolic pressure is 60.1 mmHg. Left Atrium: Left atrial size was normal in size. Right Atrium: Right atrial size was normal in size. Pericardium: There is no evidence of pericardial effusion. Mitral Valve: The mitral valve is normal in structure. Trivial mitral valve regurgitation. Tricuspid Valve: The tricuspid valve is normal in structure. Tricuspid valve regurgitation is mild. Aortic Valve: The aortic valve is grossly normal. Aortic valve regurgitation  is not visualized. Aortic valve mean gradient measures 2.0 mmHg. Aortic valve peak gradient measures 3.8 mmHg. Aortic valve area, by VTI measures 3.80 cm.  Pulmonic Valve: The pulmonic valve was normal in structure. Pulmonic valve regurgitation is not visualized. Aorta: The aortic root is normal in size and structure. IAS/Shunts: No atrial level shunt detected by color flow Doppler.  LEFT VENTRICLE PLAX 2D LVIDd:         3.62 cm  Diastology LVIDs:         2.36 cm  LV e' lateral:   4.68 cm/s LV PW:         1.03 cm  LV E/e' lateral: 12.3 LV IVS:        0.94 cm  LV e' medial:    4.46 cm/s LVOT diam:     2.00 cm  LV E/e' medial:  12.9 LV SV:         63 LV SV Index:   36 LVOT Area:     3.14 cm  RIGHT VENTRICLE RV Basal diam:  3.45 cm RV S prime:     12.00 cm/s TAPSE (M-mode): 3.4 cm LEFT ATRIUM             Index       RIGHT ATRIUM          Index LA diam:        3.80 cm 2.15 cm/m  RA Area:     9.80 cm LA Vol (A2C):   59.2 ml 33.50 ml/m RA Volume:   21.00 ml 11.88 ml/m LA Vol (A4C):   47.8 ml 27.05 ml/m LA Biplane Vol: 53.9 ml 30.50 ml/m  AORTIC VALVE                   PULMONIC VALVE AV Area (Vmax):    2.56 cm    PV Vmax:        0.79 m/s AV Area (Vmean):   2.83 cm    PV Peak grad:   2.5 mmHg AV Area (VTI):     3.80 cm    RVOT Peak grad: 2 mmHg AV Vmax:           97.55 cm/s AV Vmean:          62.400 cm/s AV VTI:            0.167 m AV Peak Grad:      3.8 mmHg AV Mean Grad:      2.0 mmHg LVOT Vmax:         79.60 cm/s LVOT Vmean:        56.300 cm/s LVOT VTI:          0.202 m LVOT/AV VTI ratio: 1.21  AORTA Ao Root diam: 2.90 cm MITRAL VALVE               TRICUSPID VALVE MV Area (PHT): 5.93 cm    TR Peak grad:   23.4 mmHg MV Decel Time: 128 msec    TR Vmax:        242.00 cm/s MV E velocity: 57.40 cm/s MV A velocity: 84.40 cm/s  SHUNTS MV E/A ratio:  0.68        Systemic VTI:  0.20 m                            Systemic Diam: 2.00 cm Dwayne Prince Rome MD Electronically signed by Yolonda Kida MD Signature Date/Time:  08/08/2020/5:23:39 PM    Final      Assessment/Plan:  59 y.o. female with medical history significant of type 1 diabetes, DKA, hypertension, CKD-4, anemia,  recent admission due to SDH, liver cirrhosis, thrombocytopenia, who presents with generalized weakness, confusion. Pt was found to have glc of 1166 and AG of 15.  On admission and imaging pt was found to have R cerebellar stroke.    - No focal abnormalities on exam - stroke is likely subacute - Ok to start eliquis for DVT - d/c planning from Neurological stand point 08/09/2020, 11:48 AM

## 2020-08-09 NOTE — Care Management Obs Status (Signed)
[place] NOTIFICATION   Patient Details  Name: Susan Navarro MRN: [id number] Date of Birth: 10-29-1961   Medicare Observation Status Notification Given:  Yes    Shelbie Hutching, RN 08/09/2020, 3:15 PM

## 2020-08-09 NOTE — TOC Progression Note (Signed)
Transition of Care [facility]) - Progression Note    Patient Details  Name: Susan Navarro MRN: [id number] Date of Birth: 1961/03/31  [facility]) CM/SW Contact  Shelbie Hutching, RN Phone Number: 08/09/2020, 3:46 PM  Clinical Narrative:     Patient's sister Susan Navarro is on her way to pick her up and transport her home.   Expected Discharge Plan: [place] Barriers to Discharge: No Barriers Identified  Expected Discharge Plan and Services Expected Discharge Plan: [place]   Discharge Planning Services: CM Consult Post Acute Care Choice: Home Health, Resumption of Svcs/PTA Provider Living arrangements for the past 2 months: Apartment Expected Discharge Date: 08/09/20                         HH Arranged: PT, OT HH Agency: [facility] (formerly [employer]) Date [place]: 08/09/20 Time [place]: 1453 Representative spoke with at [place]: [place] ([place]) Interventions    Readmission Risk Interventions Readmission Risk Prevention Plan 07/26/2020 12/28/2019  Transportation Screening Complete Complete  HRI or [place] Complete Complete  Social Work Consult for [place] Planning/Counseling Complete -  [place] Screening Not Applicable Not Applicable  Medication Review Press photographer) Complete Referral to Pharmacy  Some recent data might be hidden

## 2020-08-09 NOTE — TOC Initial Note (Signed)
Transition of Care [facility]) - Initial/Assessment Note    Patient Details  Name: Susan Navarro MRN: [id number] Date of Birth: 1961-02-13  Transition of Care [facility]) CM/SW Contact:    Shelbie Hutching, RN Phone Number: 08/09/2020, 2:55 PM  Clinical Narrative:                 Patient admitted to the hospital for hyperosmolar hyperglycemic state.  Patient is now medically stable for discharge back home.  Patient lives in an apartment in [place] with her daughter.  Patient reports that she is independent.  Patient is currently open with [facility] for PT and OT.  One of the patient's sisters will pick her up and take her home today.  Drue Novel with [facility] is aware of discharge today.    Expected Discharge Plan: [place] Barriers to Discharge: No Barriers Identified   Patient Goals and CMS Choice Patient states their goals for this hospitalization and ongoing recovery are:: Patient is ready to go home CMS Medicare.gov Compare Post Acute Care list provided to:: Patient Choice offered to / list presented to : Patient  Expected Discharge Plan and Services Expected Discharge Plan: [place]   Discharge Planning Services: CM Consult Post Acute Care Choice: Home Health, Resumption of Svcs/PTA Provider Living arrangements for the past 2 months: Apartment Expected Discharge Date: 08/09/20                         HH Arranged: PT, OT HH Agency: [facility] [facility] [employer] Date [place]: 08/09/20 Time [place]: [place] Representative spoke with at [place]: Drue Novel  Prior Living Arrangements/Services Living arrangements for the past 2 months: Apartment Lives with:: Adult Children Patient language and need for interpreter reviewed:: Yes Do you feel safe going back to the place where you live?: Yes      Need for Family Participation in Patient Care: Yes (Comment) (diabetic) Care giver support system in place?: Yes  (comment) (daughter and sisters) Current home services: DME, Home PT Criminal Activity/Legal Involvement Pertinent to Current Situation/Hospitalization: No - Comment as needed  Activities of Daily Living      Permission Sought/Granted Permission sought to share information with : Case Manager, Family Supports, Other (comment) Permission granted to share information with : Yes, Verbal Permission Granted  Share Information with NAME: Marlowe Kays, Diane  Permission granted to share info w AGENCY: [facility]  Permission granted to share info w Relationship: daughter and sisters     Emotional Assessment Appearance:: Appears stated age Attitude/Demeanor/Rapport: Engaged Affect (typically observed): Accepting Orientation: : Oriented to Self, Oriented to Place, Oriented to  Time, Oriented to Situation Alcohol / Substance Use: Not Applicable Psych Involvement: No (comment)  Admission diagnosis:  Hypomagnesemia [E83.42] Hyponatremia [E87.1] Hyperglycemia [R73.9] CVA (cerebral vascular accident) ([place]) [I63.9] Stroke ([place]) [I63.9] QT prolongation [R94.31] Hyperosmolar hyperglycemic state (HHS) ([place]) [E11.00, E11.65] Patient Active Problem List   Diagnosis Date Noted  . CVA (cerebral vascular accident) ([place]) 08/08/2020  . Hyperosmolar hyperglycemic state (HHS) ([place]) 08/07/2020  . Hypomagnesemia 08/07/2020  . Elevated troponin 08/07/2020  . HLD (hyperlipidemia) 08/07/2020  . ICH (intracerebral hemorrhage) ([place]) 07/24/2020  . SAH (subarachnoid hemorrhage) ([place]) 07/24/2020  . Fall 06/27/2020  . HTN (hypertension) 02/16/2020  . Acute renal failure superimposed on stage 4 chronic kidney disease ([place]) 12/30/2019  . Other cirrhosis of liver ([place]) 12/30/2019  . Thrombocytopenia ([place]) 12/30/2019  . 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 Transaminitis 12/30/2019  . Malnutrition of moderate degree 12/28/2019  . Anemia of chronic disease   .  Generalized weakness   . Acute metabolic encephalopathy   . DKA (diabetic ketoacidoses) ([place]) 12/25/2019  . Hyponatremia   . Hyperkalemia   . Stroke ([place]) 03/2018   PCP:  Rochel Brome, MD Pharmacy:   [facility] [phone] - [address], [place] [place] [address] Phone: [phone] Fax: [phone]  [place], [place] [address] Phone: [phone] Fax: [place] [address]), [place] - [place]) [place] [address] Phone: [phone] Fax: [phone]     Social Determinants of Health (SDOH) Interventions    Readmission Risk Interventions Readmission Risk Prevention Plan 07/26/2020 12/28/2019  Transportation Screening Complete Complete  HRI or Home Care Consult Complete Complete  Social Work Consult for [place] Planning/Counseling Complete -  Palliative Care Screening Not Applicable Not Applicable  Medication Review (RN Care Manager) Complete Referral to Pharmacy  Some recent data might be hidden

## 2020-08-09 NOTE — Progress Notes (Signed)
ANTICOAGULATION CONSULT NOTE  Pharmacy Consult for Heparin Indication: VTE treatment, h/o stroke  No Known Allergies  Patient Measurements: Height: 5' 6"  (167.6 cm) Weight: 67.8 kg (149 lb 7.6 oz) IBW/kg (Calculated) : 59.3  HEPARIN DW (KG): 67.8  Vital Signs: Temp: 98 F (36.7 C) (08/19 0913) Temp Source: Oral (08/19 0913) BP: 131/65 (08/19 0913) Pulse Rate: 87 (08/19 0913)  Labs: Recent Labs    08/07/20 1157 08/07/20 1157 08/07/20 1527 08/07/20 2233 08/07/20 2233 08/08/20 0327 08/08/20 0332 08/08/20 0457 08/08/20 2585 08/08/20 0932 08/08/20 1820 08/09/20 0039 08/09/20 0954  HGB 9.7*   < >  --   --   --   --  10.1*  --   --   --   --  8.7*  --   HCT 29.5*  --   --   --   --   --  28.5*  --   --   --   --  24.6*  --   PLT 95*  --   --   --   --   --  108*  --   --   --   --  100*  --   APTT  --   --   --   --   --   --  24  --   --   --   --   --   --   LABPROT  --   --   --   --   --   --  13.5  --   --   --   --   --   --   INR  --   --   --   --   --   --  1.1  --   --   --   --   --   --   HEPARINUNFRC  --   --   --   --   --   --   --   --   --    < > 0.41 0.29* 0.51  CREATININE 1.93*  --    < > 1.47*   < > 1.42*  --   --  1.50*  --   --  1.62*  --   TROPONINIHS  --   --    < > 108*  --  82*  --  87*  --   --   --   --   --    < > = values in this interval not displayed.    Estimated Creatinine Clearance: 34.6 mL/min (A) (by C-G formula based on SCr of 1.62 mg/dL (H)).   Medical History: Past Medical History:  Diagnosis Date   Anemia    Chronic kidney disease    Diabetes mellitus without complication ([place])    Hypertension    Stroke ([place]) 03/2018   Ischemic stroke per [facility] records   Medications:  Medications Prior to Admission  Medication Sig Dispense Refill Last Dose   amLODipine (NORVASC) 10 MG tablet Take 1 tablet (10 mg total) by mouth daily. 30 tablet 0 8-24 hours at Unknown   atorvastatin (LIPITOR) 80 MG tablet Take 80 mg by mouth  daily.   8-24 hours at Unknown   blood glucose meter kit and supplies KIT Dispense based on patient and insurance preference. Use up to four times daily as directed. (FOR ICD-9 250.00, 250.01). 1 each 0    Ensure Max Protein (ENSURE MAX PROTEIN) LIQD Take  330 mLs (11 oz total) by mouth 2 (two) times daily between meals. 330 mL 12    insulin glargine (LANTUS) 100 UNIT/ML injection Inject 0.07 mLs (7 Units total) into the skin 2 (two) times daily. 4.2 mL 0 8-24 hours at Unknown   insulin lispro (HUMALOG KWIKPEN) 100 UNIT/ML KwikPen Inject 0.02 mLs (2 Units total) into the skin 3 (three) times daily before meals. 1.8 mL 0 8-24 hours at Unknown   Insulin Syringe-Needle U-100 (HEALTHWISE INSULIN SYR/NEEDLE) 30G X 5/16" 0.5 ML MISC 1 each by Does not apply route 3 (three) times daily with meals. 50 each 0    levETIRAcetam (KEPPRA) 500 MG tablet Take 1 tablet (500 mg total) by mouth 2 (two) times daily. 60 tablet 0 8-24 hours at Unknown   Multiple Vitamin (MULTIVITAMIN WITH MINERALS) TABS tablet Take 1 tablet by mouth daily. 30 tablet 0    propranolol (INDERAL) 10 MG tablet Take 1 tablet (10 mg total) by mouth 3 (three) times daily. 90 tablet 0 8-24 hours at Unknown    Assessment: Heparin initiated for VTE treatment.  Pt w/ h/o stroke, previously on ASA.  Chronic thrombocytopenia.  No anticoagulants per PTA med list. Baseline aPTT, PT-INR WNL.   Goal of Therapy:  Heparin level 0.3-0.7 units/ml Monitor platelets by anticoagulation protocol: Yes   Plan:  --8/19 at 0954 HL = 0.51, therapeutic x 1. Continue heparin infusion at 600 units/hr. --Recheck confirmatory HL in 6 hours  --CBC daily per protocol, H/H worse, PLTs down slightly, continue to monitor  Pharmacy will continue to follow.   Benita Gutter 08/09/2020,11:25 AM

## 2020-08-09 NOTE — Progress Notes (Signed)
ANTICOAGULATION CONSULT NOTE  Pharmacy Consult for Heparin Indication: VTE treatment, h/o stroke  No Known Allergies  Patient Measurements: Height: 5' 6" (167.6 cm) Weight: 67.8 kg (149 lb 7.6 oz) IBW/kg (Calculated) : 59.3  HEPARIN DW (KG): 67.8  Vital Signs: Temp: 98.7 F (37.1 C) (08/19 0027) Temp Source: Oral (08/19 0027) BP: 108/58 (08/19 0033) Pulse Rate: 86 (08/19 0027)  Labs: Recent Labs    08/07/20 1157 08/07/20 1157 08/07/20 1527 08/07/20 2233 08/07/20 2233 08/08/20 0327 08/08/20 0332 08/08/20 0457 08/08/20 0635 08/08/20 0932 08/08/20 1820 08/09/20 0039  HGB 9.7*   < >  --   --   --   --  10.1*  --   --   --   --  8.7*  HCT 29.5*  --   --   --   --   --  28.5*  --   --   --   --  24.6*  PLT 95*  --   --   --   --   --  108*  --   --   --   --  100*  APTT  --   --   --   --   --   --  24  --   --   --   --   --   LABPROT  --   --   --   --   --   --  13.5  --   --   --   --   --   INR  --   --   --   --   --   --  1.1  --   --   --   --   --   HEPARINUNFRC  --   --   --   --   --   --   --   --   --  1.06* 0.41 0.29*  CREATININE 1.93*  --    < > 1.47*   < > 1.42*  --   --  1.50*  --   --  1.62*  TROPONINIHS  --   --    < > 108*  --  82*  --  87*  --   --   --   --    < > = values in this interval not displayed.    Estimated Creatinine Clearance: 34.6 mL/min (A) (by C-G formula based on SCr of 1.62 mg/dL (H)).   Medical History: Past Medical History:  Diagnosis Date  . Anemia   . Chronic kidney disease   . Diabetes mellitus without complication ([place])   . Hypertension   . Stroke [facility]) 03/2018   Ischemic stroke per [facility] records   Medications:  Medications Prior to Admission  Medication Sig Dispense Refill Last Dose  . amLODipine (NORVASC) 10 MG tablet Take 1 tablet (10 mg total) by mouth daily. 30 tablet 0 8-24 hours at Unknown  . atorvastatin (LIPITOR) 80 MG tablet Take 80 mg by mouth daily.   8-24 hours at Unknown  . blood glucose meter kit and  supplies KIT Dispense based on patient and insurance preference. Use up to four times daily as directed. (FOR ICD-9 250.00, 250.01). 1 each 0   . Ensure Max Protein (ENSURE MAX PROTEIN) LIQD Take 330 mLs (11 oz total) by mouth 2 (two) times daily between meals. 330 mL 12   . insulin glargine (LANTUS) 100 UNIT/ML injection Inject 0.07 mLs (7 Units  total) into the skin 2 (two) times daily. 4.2 mL 0 8-24 hours at Unknown  . insulin lispro (HUMALOG KWIKPEN) 100 UNIT/ML KwikPen Inject 0.02 mLs (2 Units total) into the skin 3 (three) times daily before meals. 1.8 mL 0 8-24 hours at Unknown  . Insulin Syringe-Needle U-100 (HEALTHWISE INSULIN SYR/NEEDLE) 30G X 5/16" 0.5 ML MISC 1 each by Does not apply route 3 (three) times daily with meals. 50 each 0   . levETIRAcetam (KEPPRA) 500 MG tablet Take 1 tablet (500 mg total) by mouth 2 (two) times daily. 60 tablet 0 8-24 hours at Unknown  . Multiple Vitamin (MULTIVITAMIN WITH MINERALS) TABS tablet Take 1 tablet by mouth daily. 30 tablet 0   . propranolol (INDERAL) 10 MG tablet Take 1 tablet (10 mg total) by mouth 3 (three) times daily. 90 tablet 0 8-24 hours at Unknown    Assessment: Heparin initiated for VTE treatment.  Pt w/ h/o stroke, previously on ASA.  Chronic thrombocytopenia.  No anticoagulants per PTA med list. Baseline aPTT, PT-INR WNL.   Goal of Therapy:  Heparin level 0.3-0.7 units/ml Monitor platelets by anticoagulation protocol: Yes   Plan:  --8/19 at 0039 HL = 0.29, subtherapeutic. Will increase heparin infusion to 600 units/hr.  H/H worse, PLTs down slightly, continue to monitor --Recheck HL in 6 hours  --CBC daily per protocol, stable  Pharmacy will continue to follow.   Hart Robinsons A 08/09/2020,4:01 AM

## 2020-08-09 NOTE — Discharge Summary (Addendum)
[place] [facility] [place] NAME: Susan Navarro    MR#:  [id number]  DATE OF BIRTH:  [birth year]  DATE OF ADMISSION:  08/07/2020 ADMITTING PHYSICIAN: Susan Costa, MD  DATE OF DISCHARGE: 08/09/2020  5:03 PM  PRIMARY CARE PHYSICIAN: Susan Brome, MD    ADMISSION DIAGNOSIS:  Hypomagnesemia [E83.42] Hyponatremia [E87.1] Hyperglycemia [R73.9] CVA (cerebral vascular accident) ([place]) [I63.9] Stroke ([place]) [I63.9] QT prolongation [R94.31] Hyperosmolar hyperglycemic state (HHS) ([place]) [E11.00, E11.65]  DISCHARGE DIAGNOSIS:  1.  Hyperosmolar hyperglycemic state with elevated blood sugar. 2.  Cerebellar infarct 3.  Acute metabolic encephalopathy 4.  Cirrhosis of the liver with elevated liver function tests 5.  Chronic thrombocytopenia 6.  Essential hypertension  SECONDARY DIAGNOSIS:   Past Medical History:  Diagnosis Date   Anemia    Chronic kidney disease    Diabetes mellitus without complication ([place])    Hypertension    Stroke ([place]) 03/2018   Ischemic stroke per [facility] COURSE:   1.  Subacute cerebellar infarct.  Patient seen by telemetry neurology initially and was not a candidate for TPA.  Patient does have other infarcts in the cerebellum, left occipital lobe, bilateral frontal and parietal lobes.  I had neurology see the patient again to determine safety of blood thinner since the patient also has a DVT.  Neurology was okay with starting Eliquis at this point since the stroke seem more subacute.  Daughter was concerned about the patient coming home.  I spoke with neurology again this evening about blood thinner and going home and he was okay with it.  The patient stated she is going home today regardless. 2.  DVT right leg popliteal into femoral vein.  Patient was started on heparin drip here in the hospital.  Switched over to Eliquis.  Would recommend short course of this potentially 3 months and then recheck a  sonogram. 3.  Hyperosmolar hyperglycemic state with an elevated sugar.  Patient states that she has her insulin at home.  She was initially on insulin drip with coming in and now on Lantus 8 units at night and short acting insulin prior to meals. 4.  Acute metabolic encephalopathy this has improved likely from elevated blood sugars on presentation 5.  Cirrhosis of liver with elevated liver function tests 6.  Chronic thrombocytopenia.  Platelet count 100. 7.  Seizure prophylaxis with Keppra 8.  Essential hypertension on propranolol Norvasc 9.  Tobacco abuse on nicotine patch 10.  Hyponatremia.   DISCHARGE CONDITIONS:   Fair  CONSULTS OBTAINED:  Treatment Team:  Susan Pain, MD  DRUG ALLERGIES:  No Known Allergies  DISCHARGE MEDICATIONS:   Allergies as of 08/09/2020   No Known Allergies     Medication List    TAKE these medications   amLODipine 10 MG tablet Commonly known as: NORVASC Take 1 tablet (10 mg total) by mouth daily.   apixaban 5 MG Tabs tablet Commonly known as: ELIQUIS Two tablets po twice a day for seven days then one tablet twice a day afterwards   atorvastatin 80 MG tablet Commonly known as: LIPITOR Take 80 mg by mouth daily.   blood glucose meter kit and supplies Kit Dispense based on patient and insurance preference. Use up to four times daily as directed. (FOR ICD-9 250.00, 250.01).   Ensure Max Protein Liqd Take 330 mLs (11 oz total) by mouth 2 (two) times daily between meals.   insulin glargine 100 UNIT/ML injection Commonly known as:  LANTUS Inject 0.08 mLs (8 Units total) into the skin at bedtime. What changed:  how much to take when to take this   insulin lispro 100 UNIT/ML KwikPen Commonly known as: HumaLOG KwikPen Inject 0.03 mLs (3 Units total) into the skin 3 (three) times daily before meals. What changed: how much to take   Insulin Syringe-Needle U-100 30G X 5/16" 0.5 ML Misc Commonly known as: HealthWise Insulin Syr/Needle 1  each by Does not apply route 3 (three) times daily with meals.   levETIRAcetam 500 MG tablet Commonly known as: KEPPRA Take 1 tablet (500 mg total) by mouth 2 (two) times daily.   multivitamin with minerals Tabs tablet Take 1 tablet by mouth daily.   nicotine 21 mg/24hr patch Commonly known as: NICODERM CQ - dosed in mg/24 hours One patch chest wall daily (okay to substitute generic)   propranolol 10 MG tablet Commonly known as: INDERAL Take 1 tablet (10 mg total) by mouth 3 (three) times daily.        DISCHARGE INSTRUCTIONS:   Follow-up PMD 5 days Follow-up with neurology 2 weeks  If you experience worsening of your admission symptoms, develop shortness of breath, life threatening emergency, suicidal or homicidal thoughts you must seek medical attention immediately by calling 911 or calling your MD immediately  if symptoms less severe.  You Must read complete instructions/literature along with all the possible adverse reactions/side effects for all the Medicines you take and that have been prescribed to you. Take any new Medicines after you have completely understood and accept all the possible adverse reactions/side effects.   Please note  You were cared for by a hospitalist during your hospital stay. If you have any questions about your discharge medications or the care you received while you were in the hospital after you are discharged, you can call the unit and asked to speak with the hospitalist on call if the hospitalist that took care of you is not available. Once you are discharged, your primary care physician will handle any further medical issues. Please note that NO REFILLS for any discharge medications will be authorized once you are discharged, as it is imperative that you return to your primary care physician (or establish a relationship with a primary care physician if you do not have one) for your aftercare needs so that they can reassess your need for medications  and monitor your lab values.    Today   CHIEF COMPLAINT:   Chief Complaint  Patient presents with   Hyperglycemia   Altered Mental Status    HISTORY OF PRESENT ILLNESS:  Susan Navarro  is a 59 y.o. female came in with elevated blood sugar and altered mental status   VITAL SIGNS:  Blood pressure (!) 127/58, pulse 91, temperature 97.9 F (36.6 C), resp. rate 16, height _0  (1.676 m), weight 67.8 kg, SpO2 100 %.  I/O:    Intake/Output Summary (Last 24 hours) at 08/09/2020 1722 Last data filed at 08/09/2020 1405 Gross per 24 hour  Intake 2563.73 ml  Output --  Net 2563.73 ml    PHYSICAL EXAMINATION:  GENERAL:  59 y.o.-year-old patient lying in the bed with no acute distress.  EYES: Pupils equal, round, reactive to light and accommodation. No scleral icterus. Extraocular muscles intact.   HEENT: Head atraumatic, normocephalic. Oropharynx and nasopharynx clear.  NECK:  Supple, no jugular venous distention. No thyroid enlargement, no tenderness.  LUNGS: Normal breath sounds bilaterally, no wheezing, rales,rhonchi or crepitation. CARDIOVASCULAR: S1, S2 normal.  No murmurs, rubs, or gallops.  ABDOMEN: Soft, non-tender, non-distended. Bowel sounds present.  EXTREMITIES: No pedal edema.  NEUROLOGIC: Cranial nerves II through XII are intact. Muscle strength 5/5 in all extremities.  Finger-nose intact bilaterally.  Sensation intact. Gait not checked.  PSYCHIATRIC: The patient is alert and answers questions appropriately.Marland Kitchen  SKIN: No obvious rash, lesion, or ulcer.   DATA REVIEW:   CBC Recent Labs  Lab 08/09/20 0039  WBC 3.5*  HGB 8.7*  HCT 24.6*  PLT 100*    Chemistries  Recent Labs  Lab 08/07/20 1157 08/07/20 1527 08/09/20 0039  NA 122*   < > 133*  K 4.9   < > 4.0  CL 87*   < > 98  CO2 20*   < > 26  GLUCOSE 1,166*   < > 364*  BUN 59*   < > 44*  CREATININE 1.93*   < > 1.62*  CALCIUM 9.2   < > 8.9  MG 1.6*  --   --   AST 92*   < > 60*  ALT 99*   < > 78*   ALKPHOS 623*   < > 371*  BILITOT 1.4*   < > 0.7   < > = values in this interval not displayed.     Microbiology Results  Results for orders placed or performed during the hospital encounter of 08/07/20  SARS Coronavirus 2 by RT PCR (hospital order, performed in [facility] hospital lab) Nasopharyngeal Nasopharyngeal Swab     Status: None   Collection Time: 08/07/20  1:19 PM   Specimen: Nasopharyngeal Swab  Result Value Ref Range Status   SARS Coronavirus 2 NEGATIVE NEGATIVE Final    Comment: (NOTE) SARS-CoV-2 target nucleic acids are NOT DETECTED.  The SARS-CoV-2 RNA is generally detectable in upper and lower respiratory specimens during the acute phase of infection. The lowest concentration of SARS-CoV-2 viral copies this assay can detect is 250 copies / mL. A negative result does not preclude SARS-CoV-2 infection and should not be used as the sole basis for treatment or other patient management decisions.  A negative result may occur with improper specimen collection / handling, submission of specimen other than nasopharyngeal swab, presence of viral mutation(s) within the areas targeted by this assay, and inadequate number of viral copies (<250 copies / mL). A negative result must be combined with clinical observations, patient history, and epidemiological information.  Fact Sheet for Patients:   StrictlyIdeas.no  Fact Sheet for Healthcare Providers: BankingDealers.co.za  This test is not yet approved or  cleared by the [place] FDA and has been authorized for detection and/or diagnosis of SARS-CoV-2 by FDA under an Emergency Use Authorization (EUA).  This EUA will remain in effect (meaning this test can be used) for the duration of the COVID-19 declaration under Section 564(b)(1) of the Act, 21 U.S.C. section 360bbb-3(b)(1), unless the authorization is terminated or revoked sooner.  Performed at [facility],  [address] [place], [place] [address]     RADIOLOGY:  CT HEAD WO CONTRAST  Result Date: 08/07/2020 CLINICAL DATA:  Altered mental status. EXAM: CT HEAD WITHOUT CONTRAST TECHNIQUE: Contiguous axial images were obtained from the base of the skull through the vertex without intravenous contrast. COMPARISON:  July 25, 2020 FINDINGS: Brain: There is mild cerebral atrophy with widening of the extra-axial spaces and ventricular dilatation. There are areas of decreased attenuation within the Boehne matter tracts of the supratentorial brain, consistent with microvascular disease changes. A 2.8 cm  x 1.4 cm area of Wyeth matter low attenuation is seen within the cerebellum on the right. This represents a new finding when compared to the prior study. Mass effect is seen on the adjacent sulci. No midline shift is noted. Stable areas of cortical encephalomalacia, with adjacent chronic Bodkins matter low attenuation are seen within the left frontal, left occipital and bilateral parietal regions, right greater than left. Very mildly increased cortical attenuation is seen within the left frontal region. Vascular: No hyperdense vessel or unexpected calcification. Skull: Normal. Negative for fracture or focal lesion. Sinuses/Orbits: No acute finding. Other: None. IMPRESSION: 1. 2.8 cm x 1.4 cm area of Manocchio matter low attenuation within the cerebellum on the right, with associated mass effect on the adjacent sulci. This represents a new finding when compared to the prior study dated July 25, 2020 and is consistent with an acute to subacute infarct. MRI correlation is recommended. 2. Very mildly increased cortical attenuation is seen within the left frontal region. A small amount of acute blood cannot completely be excluded. MRI correlation is recommended. 3. Stable areas of cortical encephalomalacia, with adjacent chronic Spade matter low attenuation are seen within the left frontal, left occipital and bilateral parietal  regions, right greater than left. Electronically Signed   By: [place]ina Norfolk M.D.   On: 08/07/2020 20:18   MR BRAIN WO CONTRAST  Result Date: 08/07/2020 CLINICAL DATA:  Cerebellar infarct EXAM: MRI HEAD WITHOUT CONTRAST TECHNIQUE: Multiplanar, multiecho pulse sequences of the brain and surrounding structures were obtained without intravenous contrast. COMPARISON:  Head CT 08/07/2020 FINDINGS: Brain: There is an intermediate sized acute infarct within the right cerebellum. No other acute ischemia. There are old infarcts of the left occipital lobe, both frontal lobes and both parietal lobes. Multifocal Finger matter hyperintensity, most commonly due to chronic ischemic microangiopathy. There is generalized atrophy without lobar predilection. There are multiple old cerebellar small vessel infarcts. There is hemosiderin deposition at the sites of the old infarcts. Vascular: Normal flow voids. Skull and upper cervical spine: Normal marrow signal. Sinuses/Orbits: Negative. Other: None. IMPRESSION: 1. Intermediate sized acute infarct of the right cerebellum. No acute hemorrhage or mass effect. 2. Multiple old infarcts of the cerebellum, left occipital lobe, both frontal lobes and both parietal lobes. 3. Chronic ischemic microangiopathy. Electronically Signed   By: Ulyses Jarred M.D.   On: 08/07/2020 22:11   US Carotid Bilateral  Result Date: 08/09/2020 CLINICAL DATA:  Right cerebellar infarct. EXAM: BILATERAL CAROTID DUPLEX ULTRASOUND TECHNIQUE: Pearline Cables scale imaging, color Doppler and duplex ultrasound were performed of bilateral carotid and vertebral arteries in the neck. COMPARISON:  Brain MRI 08/07/2020 FINDINGS: Criteria: Quantification of carotid stenosis is based on velocity parameters that correlate the residual internal carotid diameter with NASCET-based stenosis levels, using the diameter of the distal internal carotid lumen as the denominator for stenosis measurement. The following velocity measurements  were obtained: RIGHT ICA: 63/21 cm/sec CCA: 98/33 cm/sec SYSTOLIC ICA/CCA RATIO:  1.5 ECA: 71 cm/sec LEFT ICA: 98/35 cm/sec CCA: 82/50 cm/sec SYSTOLIC ICA/CCA RATIO:  1.6 ECA: 50 cm/sec RIGHT CAROTID ARTERY: Intimal thickening at the carotid bulb. External carotid artery is patent with normal waveform. Normal waveforms and velocities in the internal carotid artery. RIGHT VERTEBRAL ARTERY: Antegrade flow and normal waveform in the right vertebral artery. LEFT CAROTID ARTERY: Intimal thickening in the left internal carotid artery. External carotid artery is patent with normal waveform. Normal waveforms and velocities in the internal carotid artery. LEFT VERTEBRAL ARTERY: Antegrade flow and normal waveform  in the left vertebral artery. IMPRESSION: 1. Bilateral carotid arteries are patent without significant stenosis or plaque. 2. Patent vertebral arteries with antegrade flow. Electronically Signed   By: Markus Daft M.D.   On: 08/09/2020 08:43   US Venous Img Lower Bilateral (DVT)  Result Date: 08/08/2020 CLINICAL DATA:  Bilateral leg swelling EXAM: BILATERAL LOWER EXTREMITY VENOUS DOPPLER ULTRASOUND TECHNIQUE: Gray-scale sonography with compression, as well as color and duplex ultrasound, were performed to evaluate the deep venous systems of the bilateral lower extremities. From the level of the common femoral vein through the popliteal and proximal calf veins. COMPARISON:  None. FINDINGS: VENOUS Right lower extremity: Largely peripherally marginated echogenic thrombus which appears incompletely occlusive and only partially compressible is seen within the common femoral, saphenofemoral junction, femoral and popliteal veins. Preservation of the phasicity and augmentation is noted. The profundus femoral vein and calf veins appear widely patent. Left lower extremity: Normal compressibility of the common femoral, superficial femoral, and popliteal veins, as well as the visualized calf veins. Visualized portions of  profunda femoral vein and great saphenous vein unremarkable. No filling defects to suggest DVT on grayscale or color Doppler imaging. Doppler waveforms show normal direction of venous flow, normal respiratory plasticity and response to augmentation. OTHER None. Limitations: none IMPRESSION: Incompletely compressible nonocclusive filling defect within the right lower extremity extending from the popliteal to the common femoral vein has an appearance which could suggest remote/chronic thrombus though is overall age indeterminate. Sparing of the profundus and calf veins. No deep venous thrombosis is seen in the left lower extremity. Electronically Signed   By: Lovena Le M.D.   On: 08/08/2020 02:51   ECHOCARDIOGRAM COMPLETE  Result Date: 08/08/2020    ECHOCARDIOGRAM REPORT   Patient Name:   Davie Sagona Date of Exam: 08/08/2020 Medical Rec #:  [id number]     Height:       66.0 in Accession #:    [id number]    Weight:       149.5 lb Date of Birth:  01-27-1961      BSA:          1.767 m Patient Age:    24 years      BP:           160/83 mmHg Patient Gender: F             HR:           80 bpm. Exam Location:  [facility] Procedure: 2D Echo, Cardiac Doppler and Color Doppler Indications:     Stroke 434.91  History:         Patient has prior history of Echocardiogram examinations, most                  recent 07/24/2020. Stroke; Risk Factors:Diabetes and                  Hypertension.  Sonographer:     Sherrie Sport RDCS (AE) Referring Phys:  Hassan Rowan MORRISON Diagnosing Phys: Yolonda Kida MD IMPRESSIONS  1. Left ventricular ejection fraction, by estimation, is 60 to 65%. The left ventricle has normal function. The left ventricle has no regional wall motion abnormalities. Left ventricular diastolic parameters are consistent with Grade I diastolic dysfunction (impaired relaxation).  2. Right ventricular systolic function is normal. The right ventricular size is normal. There is normal pulmonary artery systolic pressure.  3. The  mitral valve is normal in structure. Trivial mitral valve regurgitation.  4. The aortic valve is  grossly normal. Aortic valve regurgitation is not visualized. FINDINGS  Left Ventricle: Left ventricular ejection fraction, by estimation, is 60 to 65%. The left ventricle has normal function. The left ventricle has no regional wall motion abnormalities. The left ventricular internal cavity size was normal in size. There is  no left ventricular hypertrophy. Left ventricular diastolic parameters are consistent with Grade I diastolic dysfunction (impaired relaxation). Right Ventricle: The right ventricular size is normal. No increase in right ventricular wall thickness. Right ventricular systolic function is normal. There is normal pulmonary artery systolic pressure. The tricuspid regurgitant velocity is 2.42 m/s, and  with an assumed right atrial pressure of 10 mmHg, the estimated right ventricular systolic pressure is 00.7 mmHg. Left Atrium: Left atrial size was normal in size. Right Atrium: Right atrial size was normal in size. Pericardium: There is no evidence of pericardial effusion. Mitral Valve: The mitral valve is normal in structure. Trivial mitral valve regurgitation. Tricuspid Valve: The tricuspid valve is normal in structure. Tricuspid valve regurgitation is mild. Aortic Valve: The aortic valve is grossly normal. Aortic valve regurgitation is not visualized. Aortic valve mean gradient measures 2.0 mmHg. Aortic valve peak gradient measures 3.8 mmHg. Aortic valve area, by VTI measures 3.80 cm. Pulmonic Valve: The pulmonic valve was normal in structure. Pulmonic valve regurgitation is not visualized. Aorta: The aortic root is normal in size and structure. IAS/Shunts: No atrial level shunt detected by color flow Doppler.  LEFT VENTRICLE PLAX 2D LVIDd:         3.62 cm  Diastology LVIDs:         2.36 cm  LV e' lateral:   4.68 cm/s LV PW:         1.03 cm  LV E/e' lateral: 12.3 LV IVS:        0.94 cm  LV e' medial:     4.46 cm/s LVOT diam:     2.00 cm  LV E/e' medial:  12.9 LV SV:         63 LV SV Index:   36 LVOT Area:     3.14 cm  RIGHT VENTRICLE RV Basal diam:  3.45 cm RV S prime:     12.00 cm/s TAPSE (M-mode): 3.4 cm LEFT ATRIUM             Index       RIGHT ATRIUM          Index LA diam:        3.80 cm 2.15 cm/m  RA Area:     9.80 cm LA Vol (A2C):   59.2 ml 33.50 ml/m RA Volume:   21.00 ml 11.88 ml/m LA Vol (A4C):   47.8 ml 27.05 ml/m LA Biplane Vol: 53.9 ml 30.50 ml/m  AORTIC VALVE                   PULMONIC VALVE AV Area (Vmax):    2.56 cm    PV Vmax:        0.79 m/s AV Area (Vmean):   2.83 cm    PV Peak grad:   2.5 mmHg AV Area (VTI):     3.80 cm    RVOT Peak grad: 2 mmHg AV Vmax:           97.55 cm/s AV Vmean:          62.400 cm/s AV VTI:            0.167 m AV Peak Grad:      3.8  mmHg AV Mean Grad:      2.0 mmHg LVOT Vmax:         79.60 cm/s LVOT Vmean:        56.300 cm/s LVOT VTI:          0.202 m LVOT/AV VTI ratio: 1.21  AORTA Ao Root diam: 2.90 cm MITRAL VALVE               TRICUSPID VALVE MV Area (PHT): 5.93 cm    TR Peak grad:   23.4 mmHg MV Decel Time: 128 msec    TR Vmax:        242.00 cm/s MV E velocity: 57.40 cm/s MV A velocity: 84.40 cm/s  SHUNTS MV E/A ratio:  0.68        Systemic VTI:  0.20 m                            Systemic Diam: 2.00 cm Dwayne Prince Rome MD Electronically signed by Yolonda Kida MD Signature Date/Time: 08/08/2020/5:23:39 PM    Final    Management plans discussed with the patient, and she told me she is going home today.  I spoke with the patient's daughter on the phone 3 times today.  The last time I think she hung up on me.  CODE STATUS:     Code Status Orders  (From admission, onward)         Start     Ordered   08/07/20 1355  Full code  Continuous        08/07/20 1357        Code Status History    Date Active Date Inactive Code Status Order ID Comments User Context   07/24/2020 1219 07/29/2020 2052 Full Code [id number]  Donnetta Simpers, MD ED   07/24/2020  0904 07/24/2020 1219 Full Code [id number]  Flora Lipps, MD ED   06/27/2020 1537 06/29/2020 2236 Full Code [id number]  Nolberto Hanlon, MD ED   02/16/2020 1810 02/17/2020 2133 Full Code [id number]  Susan Costa, MD Inpatient   12/25/2019 1740 12/30/2019 2259 Full Code [id number]  Wyvonnia Dusky, MD ED   10/07/2019 1210 10/09/2019 0149 Full Code [id number]  Otila Back, MD ED   08/05/2019 1608 08/07/2019 1840 Full Code [id number]  Hillary Bow, MD ED   04/14/2019 0542 04/16/2019 2009 Full Code [id number]  Mansy, Arvella Merles, MD ED   12/13/2018 1213 12/15/2018 1602 Full Code [id number]  Nicholes Mango, MD Inpatient   12/09/2018 1344 12/10/2018 1935 Full Code [id number]  Dustin Flock, MD ED   Advance Care Planning Activity      TOTAL TIME TAKING CARE OF THIS PATIENT: 35 minutes.   I also evaluated the patient again in the evening prior to discharge.  Patient wanted to go home.  He was moving all of her extremities and strength was good.  She was answering all questions appropriately.  Spoke with neurology after my exam in the evening and he was okay with blood thinner and discharge.  Loletha Grayer M.D on 08/09/2020 at 5:22 PM  Between 7am to 6pm - Pager - [phone]  After 6pm go to [email or web address] - password [email or web address]  [facility]  CC: Primary care physician; Susan Brome, MD

## 2020-10-16 NOTE — ED Provider Notes (Signed)
.  Critical Care Performed by: Lucrezia Starch, MD Authorized by: Lucrezia Starch, MD   Critical care provider statement:    Critical care time (minutes):  45   Critical care time was exclusive of:  Separately billable procedures and treating other patients   Critical care was necessary to treat or prevent imminent or life-threatening deterioration of the following conditions:  Endocrine crisis and dehydration   Critical care was time spent personally by me on the following activities:  Discussions with consultants, evaluation of patient's response to treatment, examination of patient, ordering and performing treatments and interventions, ordering and review of laboratory studies, ordering and review of radiographic studies, pulse oximetry, re-evaluation of patient's condition, obtaining history from patient or surrogate and review of old charts      Lucrezia Starch, MD 10/16/20 1730

## 2021-02-19 DEATH — deceased
